# Patient Record
Sex: Male | Born: 1960 | Race: White | Hispanic: No | Marital: Single | Smoking: Former smoker
Health system: Southern US, Community
[De-identification: ages and names within clinical notes are randomized; demographics above are authoritative.]

## PROBLEM LIST (undated history)

## (undated) DIAGNOSIS — F32A Depression, unspecified: Secondary | ICD-10-CM

## (undated) DIAGNOSIS — E119 Type 2 diabetes mellitus without complications: Secondary | ICD-10-CM

## (undated) DIAGNOSIS — E785 Hyperlipidemia, unspecified: Secondary | ICD-10-CM

## (undated) DIAGNOSIS — K219 Gastro-esophageal reflux disease without esophagitis: Secondary | ICD-10-CM

## (undated) DIAGNOSIS — F329 Major depressive disorder, single episode, unspecified: Secondary | ICD-10-CM

## (undated) DIAGNOSIS — E8881 Metabolic syndrome: Secondary | ICD-10-CM

## (undated) DIAGNOSIS — C801 Malignant (primary) neoplasm, unspecified: Secondary | ICD-10-CM

## (undated) DIAGNOSIS — R112 Nausea with vomiting, unspecified: Secondary | ICD-10-CM

## (undated) DIAGNOSIS — I10 Essential (primary) hypertension: Secondary | ICD-10-CM

## (undated) DIAGNOSIS — E669 Obesity, unspecified: Secondary | ICD-10-CM

## (undated) DIAGNOSIS — Z9889 Other specified postprocedural states: Secondary | ICD-10-CM

## (undated) HISTORY — DX: Metabolic syndrome: E88.810

## (undated) HISTORY — DX: Obesity, unspecified: E66.9

## (undated) HISTORY — DX: Depression, unspecified: F32.A

## (undated) HISTORY — DX: Type 2 diabetes mellitus without complications: E11.9

## (undated) HISTORY — DX: Gastro-esophageal reflux disease without esophagitis: K21.9

## (undated) HISTORY — DX: Hyperlipidemia, unspecified: E78.5

## (undated) HISTORY — PX: POLYPECTOMY: SHX149

## (undated) HISTORY — DX: Metabolic syndrome: E88.81

## (undated) HISTORY — DX: Essential (primary) hypertension: I10

---
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
## 1987-03-30 HISTORY — PX: CYST REMOVAL TRUNK: SHX6283

## 2000-12-05 ENCOUNTER — Encounter: Payer: Self-pay | Admitting: Internal Medicine

## 2000-12-06 ENCOUNTER — Other Ambulatory Visit: Admission: RE | Admit: 2000-12-06 | Discharge: 2000-12-06 | Payer: Self-pay | Admitting: Gastroenterology

## 2000-12-06 ENCOUNTER — Encounter (INDEPENDENT_AMBULATORY_CARE_PROVIDER_SITE_OTHER): Payer: Self-pay | Admitting: Specialist

## 2004-02-13 ENCOUNTER — Ambulatory Visit: Payer: Self-pay | Admitting: Internal Medicine

## 2004-03-10 ENCOUNTER — Ambulatory Visit: Payer: Self-pay | Admitting: Internal Medicine

## 2004-03-31 ENCOUNTER — Ambulatory Visit: Payer: Self-pay | Admitting: Internal Medicine

## 2004-06-12 ENCOUNTER — Ambulatory Visit: Payer: Self-pay | Admitting: Internal Medicine

## 2004-09-10 ENCOUNTER — Ambulatory Visit: Payer: Self-pay | Admitting: Internal Medicine

## 2004-10-19 ENCOUNTER — Ambulatory Visit: Payer: Self-pay | Admitting: Internal Medicine

## 2004-10-27 ENCOUNTER — Ambulatory Visit: Payer: Self-pay | Admitting: Internal Medicine

## 2005-02-02 ENCOUNTER — Ambulatory Visit: Payer: Self-pay | Admitting: Internal Medicine

## 2005-05-06 ENCOUNTER — Ambulatory Visit: Payer: Self-pay | Admitting: Internal Medicine

## 2005-05-13 ENCOUNTER — Ambulatory Visit: Payer: Self-pay | Admitting: Internal Medicine

## 2005-08-05 ENCOUNTER — Ambulatory Visit: Payer: Self-pay | Admitting: Internal Medicine

## 2005-08-12 ENCOUNTER — Ambulatory Visit: Payer: Self-pay | Admitting: Internal Medicine

## 2005-12-16 ENCOUNTER — Ambulatory Visit: Payer: Self-pay | Admitting: Internal Medicine

## 2005-12-24 ENCOUNTER — Ambulatory Visit: Payer: Self-pay | Admitting: Internal Medicine

## 2006-03-29 HISTORY — PX: MELANOMA EXCISION: SHX5266

## 2006-07-05 ENCOUNTER — Ambulatory Visit: Payer: Self-pay | Admitting: Internal Medicine

## 2006-07-11 ENCOUNTER — Ambulatory Visit: Payer: Self-pay | Admitting: Internal Medicine

## 2006-07-19 LAB — CBC WITH DIFFERENTIAL/PLATELET
BASO%: 0.6 % (ref 0.0–2.0)
HCT: 39.5 % (ref 38.7–49.9)
MCHC: 35.3 g/dL (ref 32.0–35.9)
MONO#: 0.6 10*3/uL (ref 0.1–0.9)
NEUT%: 45 % (ref 40.0–75.0)
WBC: 6.2 10*3/uL (ref 4.0–10.0)
lymph#: 2.6 10*3/uL (ref 0.9–3.3)

## 2006-07-19 LAB — COMPREHENSIVE METABOLIC PANEL
ALT: 29 U/L (ref 0–53)
Albumin: 4.3 g/dL (ref 3.5–5.2)
CO2: 27 mEq/L (ref 19–32)
Calcium: 9.3 mg/dL (ref 8.4–10.5)
Chloride: 103 mEq/L (ref 96–112)
Creatinine, Ser: 1.03 mg/dL (ref 0.40–1.50)
Potassium: 4.9 mEq/L (ref 3.5–5.3)
Sodium: 139 mEq/L (ref 135–145)
Total Protein: 7.1 g/dL (ref 6.0–8.3)

## 2006-07-19 LAB — LACTATE DEHYDROGENASE: LDH: 139 U/L (ref 94–250)

## 2006-07-22 ENCOUNTER — Ambulatory Visit (HOSPITAL_COMMUNITY): Admission: RE | Admit: 2006-07-22 | Discharge: 2006-07-22 | Payer: Self-pay | Admitting: Internal Medicine

## 2006-07-25 ENCOUNTER — Ambulatory Visit (HOSPITAL_COMMUNITY): Admission: RE | Admit: 2006-07-25 | Discharge: 2006-07-25 | Payer: Self-pay | Admitting: Otolaryngology

## 2006-07-26 ENCOUNTER — Encounter (INDEPENDENT_AMBULATORY_CARE_PROVIDER_SITE_OTHER): Payer: Self-pay | Admitting: *Deleted

## 2006-07-26 ENCOUNTER — Ambulatory Visit (HOSPITAL_BASED_OUTPATIENT_CLINIC_OR_DEPARTMENT_OTHER): Admission: RE | Admit: 2006-07-26 | Discharge: 2006-07-26 | Payer: Self-pay | Admitting: Otolaryngology

## 2006-08-02 ENCOUNTER — Encounter: Payer: Self-pay | Admitting: Internal Medicine

## 2006-08-09 LAB — COMPREHENSIVE METABOLIC PANEL
AST: 21 U/L (ref 0–37)
Albumin: 4.4 g/dL (ref 3.5–5.2)
BUN: 12 mg/dL (ref 6–23)
Calcium: 9.2 mg/dL (ref 8.4–10.5)
Chloride: 100 mEq/L (ref 96–112)
Potassium: 4.3 mEq/L (ref 3.5–5.3)

## 2006-08-09 LAB — CBC WITH DIFFERENTIAL/PLATELET
BASO%: 0.4 % (ref 0.0–2.0)
EOS%: 2.5 % (ref 0.0–7.0)
MCH: 28.5 pg (ref 28.0–33.4)
MCHC: 35.2 g/dL (ref 32.0–35.9)
MONO%: 6.8 % (ref 0.0–13.0)
RBC: 4.9 10*6/uL (ref 4.20–5.71)
RDW: 13 % (ref 11.2–14.6)
lymph#: 2.6 10*3/uL (ref 0.9–3.3)

## 2006-08-15 ENCOUNTER — Inpatient Hospital Stay (HOSPITAL_COMMUNITY): Admission: RE | Admit: 2006-08-15 | Discharge: 2006-08-17 | Payer: Self-pay | Admitting: Otolaryngology

## 2006-08-15 ENCOUNTER — Encounter (INDEPENDENT_AMBULATORY_CARE_PROVIDER_SITE_OTHER): Payer: Self-pay | Admitting: Otolaryngology

## 2006-08-30 ENCOUNTER — Ambulatory Visit: Payer: Self-pay | Admitting: Internal Medicine

## 2006-08-31 ENCOUNTER — Ambulatory Visit: Payer: Self-pay | Admitting: Internal Medicine

## 2006-08-31 LAB — CONVERTED CEMR LAB
Basophils Absolute: 0 10*3/uL (ref 0.0–0.1)
Basophils Relative: 0.1 % (ref 0.0–1.0)
Bilirubin, Direct: 0.1 mg/dL (ref 0.0–0.3)
CO2: 34 meq/L — ABNORMAL HIGH (ref 19–32)
Creatinine, Ser: 0.9 mg/dL (ref 0.4–1.5)
Direct LDL: 93 mg/dL
Glucose, Bld: 117 mg/dL — ABNORMAL HIGH (ref 70–99)
HCT: 39.5 % (ref 39.0–52.0)
Hemoglobin: 13.8 g/dL (ref 13.0–17.0)
Hgb A1c MFr Bld: 6.4 % — ABNORMAL HIGH (ref 4.6–6.0)
MCHC: 35 g/dL (ref 30.0–36.0)
Monocytes Absolute: 0.4 10*3/uL (ref 0.2–0.7)
Neutrophils Relative %: 51.6 % (ref 43.0–77.0)
Potassium: 4.6 meq/L (ref 3.5–5.1)
RDW: 12.6 % (ref 11.5–14.6)
Sodium: 140 meq/L (ref 135–145)
Total Bilirubin: 0.7 mg/dL (ref 0.3–1.2)
Total CHOL/HDL Ratio: 4.6
Total Protein: 6.8 g/dL (ref 6.0–8.3)

## 2006-09-01 LAB — CBC WITH DIFFERENTIAL/PLATELET
BASO%: 0.3 % (ref 0.0–2.0)
EOS%: 2.7 % (ref 0.0–7.0)
Eosinophils Absolute: 0.2 10*3/uL (ref 0.0–0.5)
MCH: 28.9 pg (ref 28.0–33.4)
MCHC: 35.3 g/dL (ref 32.0–35.9)
MCV: 81.8 fL (ref 81.6–98.0)
MONO%: 5.3 % (ref 0.0–13.0)
NEUT#: 3.8 10*3/uL (ref 1.5–6.5)
RBC: 4.87 10*6/uL (ref 4.20–5.71)
RDW: 13 % (ref 11.2–14.6)

## 2006-09-01 LAB — COMPREHENSIVE METABOLIC PANEL
ALT: 32 U/L (ref 0–53)
AST: 26 U/L (ref 0–37)
Albumin: 4.6 g/dL (ref 3.5–5.2)
Alkaline Phosphatase: 78 U/L (ref 39–117)
Potassium: 4.4 mEq/L (ref 3.5–5.3)
Sodium: 139 mEq/L (ref 135–145)
Total Protein: 7.4 g/dL (ref 6.0–8.3)

## 2006-09-06 ENCOUNTER — Ambulatory Visit: Payer: Self-pay | Admitting: Internal Medicine

## 2006-09-19 LAB — COMPREHENSIVE METABOLIC PANEL
Albumin: 4.2 g/dL (ref 3.5–5.2)
CO2: 29 mEq/L (ref 19–32)
Calcium: 9.4 mg/dL (ref 8.4–10.5)
Glucose, Bld: 125 mg/dL — ABNORMAL HIGH (ref 70–99)
Sodium: 139 mEq/L (ref 135–145)
Total Bilirubin: 0.4 mg/dL (ref 0.3–1.2)
Total Protein: 7 g/dL (ref 6.0–8.3)

## 2006-09-19 LAB — CBC WITH DIFFERENTIAL/PLATELET
BASO%: 0.8 % (ref 0.0–2.0)
Eosinophils Absolute: 0.2 10*3/uL (ref 0.0–0.5)
HCT: 43.3 % (ref 38.7–49.9)
LYMPH%: 37 % (ref 14.0–48.0)
MCHC: 34 g/dL (ref 32.0–35.9)
MCV: 81.1 fL — ABNORMAL LOW (ref 81.6–98.0)
MONO%: 8 % (ref 0.0–13.0)
NEUT%: 50 % (ref 40.0–75.0)
Platelets: 320 10*3/uL (ref 145–400)
RBC: 5.35 10*6/uL (ref 4.20–5.71)

## 2006-09-26 LAB — CBC WITH DIFFERENTIAL/PLATELET
Eosinophils Absolute: 0 10*3/uL (ref 0.0–0.5)
MONO#: 0.3 10*3/uL (ref 0.1–0.9)
NEUT#: 1.4 10*3/uL — ABNORMAL LOW (ref 1.5–6.5)
Platelets: 195 10*3/uL (ref 145–400)
RBC: 5.34 10*6/uL (ref 4.20–5.71)
RDW: 12.6 % (ref 11.2–14.6)
WBC: 3.6 10*3/uL — ABNORMAL LOW (ref 4.0–10.0)
lymph#: 1.8 10*3/uL (ref 0.9–3.3)

## 2006-09-26 LAB — COMPREHENSIVE METABOLIC PANEL
ALT: 77 U/L — ABNORMAL HIGH (ref 0–53)
Albumin: 4.2 g/dL (ref 3.5–5.2)
CO2: 24 mEq/L (ref 19–32)
Chloride: 103 mEq/L (ref 96–112)
Glucose, Bld: 123 mg/dL — ABNORMAL HIGH (ref 70–99)
Potassium: 4.2 mEq/L (ref 3.5–5.3)
Sodium: 137 mEq/L (ref 135–145)
Total Protein: 7 g/dL (ref 6.0–8.3)

## 2006-09-26 LAB — LACTATE DEHYDROGENASE: LDH: 171 U/L (ref 94–250)

## 2006-09-29 LAB — CBC WITH DIFFERENTIAL/PLATELET
BASO%: 0.2 % (ref 0.0–2.0)
EOS%: 0.3 % (ref 0.0–7.0)
LYMPH%: 50.5 % — ABNORMAL HIGH (ref 14.0–48.0)
MCH: 27.7 pg — ABNORMAL LOW (ref 28.0–33.4)
MCHC: 35.2 g/dL (ref 32.0–35.9)
MONO#: 0.3 10*3/uL (ref 0.1–0.9)
RBC: 5.16 10*6/uL (ref 4.20–5.71)
WBC: 2.7 10*3/uL — ABNORMAL LOW (ref 4.0–10.0)
lymph#: 1.4 10*3/uL (ref 0.9–3.3)

## 2006-10-03 ENCOUNTER — Encounter: Payer: Self-pay | Admitting: Internal Medicine

## 2006-10-03 LAB — CBC WITH DIFFERENTIAL/PLATELET
BASO%: 0.2 % (ref 0.0–2.0)
EOS%: 0.8 % (ref 0.0–7.0)
HCT: 46.3 % (ref 38.7–49.9)
LYMPH%: 41.6 % (ref 14.0–48.0)
MCH: 27.1 pg — ABNORMAL LOW (ref 28.0–33.4)
MCHC: 34 g/dL (ref 32.0–35.9)
NEUT%: 49.9 % (ref 40.0–75.0)
Platelets: 264 10*3/uL (ref 145–400)

## 2006-10-05 LAB — COMPREHENSIVE METABOLIC PANEL
ALT: 77 U/L — ABNORMAL HIGH (ref 0–53)
AST: 32 U/L (ref 0–37)
Albumin: 3.7 g/dL (ref 3.5–5.2)
Alkaline Phosphatase: 95 U/L (ref 39–117)
BUN: 10 mg/dL (ref 6–23)
CO2: 25 mEq/L (ref 19–32)
Calcium: 8.6 mg/dL (ref 8.4–10.5)
Chloride: 105 mEq/L (ref 96–112)
Creatinine, Ser: 0.94 mg/dL (ref 0.40–1.50)
Glucose, Bld: 173 mg/dL — ABNORMAL HIGH (ref 70–99)
Potassium: 4.2 mEq/L (ref 3.5–5.3)
Sodium: 140 mEq/L (ref 135–145)
Total Bilirubin: 0.4 mg/dL (ref 0.3–1.2)
Total Protein: 6.4 g/dL (ref 6.0–8.3)

## 2006-10-05 LAB — CBC WITH DIFFERENTIAL/PLATELET
BASO%: 0.1 % (ref 0.0–2.0)
EOS%: 0.4 % (ref 0.0–7.0)
MCH: 27.4 pg — ABNORMAL LOW (ref 28.0–33.4)
MCHC: 34.4 g/dL (ref 32.0–35.9)
RDW: 10.7 % — ABNORMAL LOW (ref 11.2–14.6)
lymph#: 1.6 10*3/uL (ref 0.9–3.3)

## 2006-10-10 LAB — CBC WITH DIFFERENTIAL/PLATELET
BASO%: 0.3 % (ref 0.0–2.0)
EOS%: 0.8 % (ref 0.0–7.0)
HCT: 40.5 % (ref 38.7–49.9)
LYMPH%: 57 % — ABNORMAL HIGH (ref 14.0–48.0)
MCH: 28.1 pg (ref 28.0–33.4)
MCHC: 35.4 g/dL (ref 32.0–35.9)
MCV: 79.4 fL — ABNORMAL LOW (ref 81.6–98.0)
MONO%: 10.8 % (ref 0.0–13.0)
NEUT%: 31.1 % — ABNORMAL LOW (ref 40.0–75.0)
lymph#: 1.7 10*3/uL (ref 0.9–3.3)

## 2006-10-12 ENCOUNTER — Ambulatory Visit: Payer: Self-pay | Admitting: Internal Medicine

## 2006-10-13 LAB — CBC WITH DIFFERENTIAL/PLATELET
BASO%: 0.3 % (ref 0.0–2.0)
EOS%: 0.2 % (ref 0.0–7.0)
HGB: 13.1 g/dL (ref 13.0–17.1)
MCH: 27.9 pg — ABNORMAL LOW (ref 28.0–33.4)
MCHC: 35.1 g/dL (ref 32.0–35.9)
MONO%: 5 % (ref 0.0–13.0)
RBC: 4.68 10*6/uL (ref 4.20–5.71)
RDW: 12.8 % (ref 11.2–14.6)
lymph#: 1.6 10*3/uL (ref 0.9–3.3)

## 2006-10-17 ENCOUNTER — Encounter: Payer: Self-pay | Admitting: Internal Medicine

## 2006-10-17 LAB — COMPREHENSIVE METABOLIC PANEL
ALT: 84 U/L — ABNORMAL HIGH (ref 0–53)
AST: 56 U/L — ABNORMAL HIGH (ref 0–37)
Alkaline Phosphatase: 147 U/L — ABNORMAL HIGH (ref 39–117)
Creatinine, Ser: 0.91 mg/dL (ref 0.40–1.50)
Sodium: 137 mEq/L (ref 135–145)
Total Bilirubin: 0.5 mg/dL (ref 0.3–1.2)
Total Protein: 7.2 g/dL (ref 6.0–8.3)

## 2006-10-17 LAB — CBC WITH DIFFERENTIAL/PLATELET
Basophils Absolute: 0 10*3/uL (ref 0.0–0.1)
Eosinophils Absolute: 0 10*3/uL (ref 0.0–0.5)
HGB: 14.5 g/dL (ref 13.0–17.1)
NEUT#: 1.1 10*3/uL — ABNORMAL LOW (ref 1.5–6.5)
RBC: 5.24 10*6/uL (ref 4.20–5.71)
RDW: 11.1 % — ABNORMAL LOW (ref 11.2–14.6)
WBC: 3.5 10*3/uL — ABNORMAL LOW (ref 4.0–10.0)
lymph#: 1.9 10*3/uL (ref 0.9–3.3)

## 2006-11-01 LAB — COMPREHENSIVE METABOLIC PANEL
ALT: 32 U/L (ref 0–53)
BUN: 14 mg/dL (ref 6–23)
CO2: 22 mEq/L (ref 19–32)
Calcium: 9.5 mg/dL (ref 8.4–10.5)
Chloride: 104 mEq/L (ref 96–112)
Creatinine, Ser: 0.9 mg/dL (ref 0.40–1.50)
Total Bilirubin: 1.1 mg/dL (ref 0.3–1.2)

## 2006-11-01 LAB — CBC WITH DIFFERENTIAL/PLATELET
BASO%: 0.4 % (ref 0.0–2.0)
Basophils Absolute: 0 10*3/uL (ref 0.0–0.1)
EOS%: 2.4 % (ref 0.0–7.0)
HCT: 36.9 % — ABNORMAL LOW (ref 38.7–49.9)
HGB: 13 g/dL (ref 13.0–17.1)
LYMPH%: 27.5 % (ref 14.0–48.0)
MCH: 28 pg (ref 28.0–33.4)
MCHC: 35.2 g/dL (ref 32.0–35.9)
NEUT%: 58.4 % (ref 40.0–75.0)
Platelets: 326 10*3/uL (ref 145–400)
lymph#: 1.8 10*3/uL (ref 0.9–3.3)

## 2006-11-01 LAB — LACTATE DEHYDROGENASE: LDH: 138 U/L (ref 94–250)

## 2006-11-07 ENCOUNTER — Ambulatory Visit: Payer: Self-pay | Admitting: Internal Medicine

## 2006-11-07 DIAGNOSIS — T7841XA Arthus phenomenon, initial encounter: Secondary | ICD-10-CM | POA: Insufficient documentation

## 2006-11-07 DIAGNOSIS — E1169 Type 2 diabetes mellitus with other specified complication: Secondary | ICD-10-CM | POA: Insufficient documentation

## 2006-11-07 DIAGNOSIS — E785 Hyperlipidemia, unspecified: Secondary | ICD-10-CM

## 2006-11-07 DIAGNOSIS — K219 Gastro-esophageal reflux disease without esophagitis: Secondary | ICD-10-CM

## 2006-11-07 LAB — CBC WITH DIFFERENTIAL/PLATELET
Basophils Absolute: 0 10*3/uL (ref 0.0–0.1)
Eosinophils Absolute: 0.2 10*3/uL (ref 0.0–0.5)
HCT: 36.1 % — ABNORMAL LOW (ref 38.7–49.9)
HGB: 12.9 g/dL — ABNORMAL LOW (ref 13.0–17.1)
MCH: 28.6 pg (ref 28.0–33.4)
MONO#: 0.6 10*3/uL (ref 0.1–0.9)
NEUT#: 4.3 10*3/uL (ref 1.5–6.5)
NEUT%: 61.6 % (ref 40.0–75.0)
WBC: 6.9 10*3/uL (ref 4.0–10.0)
lymph#: 1.8 10*3/uL (ref 0.9–3.3)

## 2006-11-07 LAB — COMPREHENSIVE METABOLIC PANEL
Albumin: 4.2 g/dL (ref 3.5–5.2)
BUN: 16 mg/dL (ref 6–23)
CO2: 26 mEq/L (ref 19–32)
Calcium: 9.6 mg/dL (ref 8.4–10.5)
Chloride: 104 mEq/L (ref 96–112)
Creatinine, Ser: 0.97 mg/dL (ref 0.40–1.50)
Potassium: 4.2 mEq/L (ref 3.5–5.3)

## 2006-11-07 LAB — LACTATE DEHYDROGENASE: LDH: 148 U/L (ref 94–250)

## 2006-11-07 LAB — CONVERTED CEMR LAB
BUN: 11 mg/dL (ref 6–23)
CO2: 30 meq/L (ref 19–32)
Calcium: 8.8 mg/dL (ref 8.4–10.5)
Creatinine, Ser: 0.9 mg/dL (ref 0.4–1.5)
Direct LDL: 187.7 mg/dL
Hgb A1c MFr Bld: 6.8 % — ABNORMAL HIGH (ref 4.6–6.0)

## 2006-11-14 LAB — CBC WITH DIFFERENTIAL/PLATELET
Basophils Absolute: 0 10*3/uL (ref 0.0–0.1)
EOS%: 2.3 % (ref 0.0–7.0)
Eosinophils Absolute: 0.1 10*3/uL (ref 0.0–0.5)
HGB: 13.9 g/dL (ref 13.0–17.1)
MCH: 28.1 pg (ref 28.0–33.4)
MONO%: 9.3 % (ref 0.0–13.0)
NEUT#: 1.3 10*3/uL — ABNORMAL LOW (ref 1.5–6.5)
RBC: 4.95 10*6/uL (ref 4.20–5.71)
RDW: 13.6 % (ref 11.2–14.6)
lymph#: 2.1 10*3/uL (ref 0.9–3.3)

## 2006-11-14 LAB — COMPREHENSIVE METABOLIC PANEL
AST: 25 U/L (ref 0–37)
Albumin: 4.4 g/dL (ref 3.5–5.2)
Alkaline Phosphatase: 93 U/L (ref 39–117)
BUN: 10 mg/dL (ref 6–23)
Calcium: 9.3 mg/dL (ref 8.4–10.5)
Chloride: 100 mEq/L (ref 96–112)
Potassium: 4.3 mEq/L (ref 3.5–5.3)
Sodium: 139 mEq/L (ref 135–145)
Total Protein: 7.5 g/dL (ref 6.0–8.3)

## 2006-11-21 LAB — CBC WITH DIFFERENTIAL/PLATELET
BASO%: 0.3 % (ref 0.0–2.0)
Eosinophils Absolute: 0.1 10*3/uL (ref 0.0–0.5)
HCT: 39.5 % (ref 38.7–49.9)
LYMPH%: 49.8 % — ABNORMAL HIGH (ref 14.0–48.0)
MCHC: 35.4 g/dL (ref 32.0–35.9)
MCV: 79.2 fL — ABNORMAL LOW (ref 81.6–98.0)
MONO#: 0.4 10*3/uL (ref 0.1–0.9)
MONO%: 9.3 % (ref 0.0–13.0)
NEUT%: 37.8 % — ABNORMAL LOW (ref 40.0–75.0)
Platelets: 223 10*3/uL (ref 145–400)
WBC: 4 10*3/uL (ref 4.0–10.0)

## 2006-11-23 ENCOUNTER — Ambulatory Visit: Payer: Self-pay | Admitting: Internal Medicine

## 2006-11-30 LAB — COMPREHENSIVE METABOLIC PANEL
ALT: 42 U/L (ref 0–53)
AST: 27 U/L (ref 0–37)
Alkaline Phosphatase: 82 U/L (ref 39–117)
Creatinine, Ser: 0.87 mg/dL (ref 0.40–1.50)
Total Bilirubin: 0.4 mg/dL (ref 0.3–1.2)

## 2006-11-30 LAB — CBC WITH DIFFERENTIAL/PLATELET
BASO%: 1 % (ref 0.0–2.0)
EOS%: 2.9 % (ref 0.0–7.0)
HCT: 38.8 % (ref 38.7–49.9)
LYMPH%: 52 % — ABNORMAL HIGH (ref 14.0–48.0)
MCH: 28.3 pg (ref 28.0–33.4)
MCHC: 36.1 g/dL — ABNORMAL HIGH (ref 32.0–35.9)
NEUT%: 37 % — ABNORMAL LOW (ref 40.0–75.0)
Platelets: 196 10*3/uL (ref 145–400)

## 2006-11-30 LAB — LACTATE DEHYDROGENASE: LDH: 160 U/L (ref 94–250)

## 2006-12-01 ENCOUNTER — Encounter: Payer: Self-pay | Admitting: Internal Medicine

## 2006-12-05 ENCOUNTER — Encounter: Payer: Self-pay | Admitting: Internal Medicine

## 2006-12-05 LAB — COMPREHENSIVE METABOLIC PANEL
Albumin: 4.3 g/dL (ref 3.5–5.2)
Alkaline Phosphatase: 93 U/L (ref 39–117)
BUN: 14 mg/dL (ref 6–23)
Calcium: 9.2 mg/dL (ref 8.4–10.5)
Chloride: 104 mEq/L (ref 96–112)
Glucose, Bld: 133 mg/dL — ABNORMAL HIGH (ref 70–99)
Potassium: 4.3 mEq/L (ref 3.5–5.3)
Sodium: 140 mEq/L (ref 135–145)
Total Protein: 7 g/dL (ref 6.0–8.3)

## 2006-12-05 LAB — CBC WITH DIFFERENTIAL/PLATELET
Basophils Absolute: 0 10*3/uL (ref 0.0–0.1)
EOS%: 3.6 % (ref 0.0–7.0)
HCT: 37.6 % — ABNORMAL LOW (ref 38.7–49.9)
HGB: 13.7 g/dL (ref 13.0–17.1)
MCH: 28.5 pg (ref 28.0–33.4)
MCV: 78.4 fL — ABNORMAL LOW (ref 81.6–98.0)
NEUT%: 29.1 % — ABNORMAL LOW (ref 40.0–75.0)
Platelets: 187 10*3/uL (ref 145–400)
lymph#: 1.7 10*3/uL (ref 0.9–3.3)

## 2006-12-12 LAB — CBC WITH DIFFERENTIAL/PLATELET
EOS%: 5.1 % (ref 0.0–7.0)
MCHC: 35.5 g/dL (ref 32.0–35.9)
MCV: 78.7 fL — ABNORMAL LOW (ref 81.6–98.0)
MONO#: 0.4 10*3/uL (ref 0.1–0.9)
NEUT#: 1.2 10*3/uL — ABNORMAL LOW (ref 1.5–6.5)
Platelets: 172 10*3/uL (ref 145–400)
RBC: 4.95 10*6/uL (ref 4.20–5.71)
RDW: 14.1 % (ref 11.2–14.6)
WBC: 3.7 10*3/uL — ABNORMAL LOW (ref 4.0–10.0)

## 2006-12-12 LAB — LACTATE DEHYDROGENASE: LDH: 173 U/L (ref 94–250)

## 2006-12-12 LAB — COMPREHENSIVE METABOLIC PANEL
AST: 26 U/L (ref 0–37)
BUN: 12 mg/dL (ref 6–23)
CO2: 27 mEq/L (ref 19–32)
Calcium: 8.7 mg/dL (ref 8.4–10.5)
Chloride: 104 mEq/L (ref 96–112)
Creatinine, Ser: 1.1 mg/dL (ref 0.40–1.50)
Glucose, Bld: 125 mg/dL — ABNORMAL HIGH (ref 70–99)

## 2006-12-16 ENCOUNTER — Ambulatory Visit: Payer: Self-pay | Admitting: Internal Medicine

## 2006-12-16 DIAGNOSIS — Z85820 Personal history of malignant melanoma of skin: Secondary | ICD-10-CM

## 2006-12-16 DIAGNOSIS — F3342 Major depressive disorder, recurrent, in full remission: Secondary | ICD-10-CM

## 2006-12-16 LAB — CONVERTED CEMR LAB
Cholesterol, target level: 200 mg/dL
HDL goal, serum: 40 mg/dL

## 2006-12-19 LAB — COMPREHENSIVE METABOLIC PANEL
BUN: 15 mg/dL (ref 6–23)
CO2: 26 mEq/L (ref 19–32)
Calcium: 8.8 mg/dL (ref 8.4–10.5)
Chloride: 104 mEq/L (ref 96–112)
Creatinine, Ser: 1.01 mg/dL (ref 0.40–1.50)
Glucose, Bld: 143 mg/dL — ABNORMAL HIGH (ref 70–99)

## 2006-12-19 LAB — LACTATE DEHYDROGENASE: LDH: 163 U/L (ref 94–250)

## 2006-12-19 LAB — CBC WITH DIFFERENTIAL/PLATELET
Basophils Absolute: 0 10*3/uL (ref 0.0–0.1)
Eosinophils Absolute: 0.1 10*3/uL (ref 0.0–0.5)
HGB: 13.2 g/dL (ref 13.0–17.1)
NEUT#: 3.2 10*3/uL (ref 1.5–6.5)
RDW: 13.9 % (ref 11.2–14.6)
WBC: 5.1 10*3/uL (ref 4.0–10.0)
lymph#: 1.5 10*3/uL (ref 0.9–3.3)

## 2006-12-26 LAB — COMPREHENSIVE METABOLIC PANEL
AST: 21 U/L (ref 0–37)
Albumin: 4.3 g/dL (ref 3.5–5.2)
BUN: 13 mg/dL (ref 6–23)
Calcium: 9 mg/dL (ref 8.4–10.5)
Chloride: 104 mEq/L (ref 96–112)
Potassium: 4.4 mEq/L (ref 3.5–5.3)

## 2006-12-26 LAB — CBC WITH DIFFERENTIAL/PLATELET
Basophils Absolute: 0 10*3/uL (ref 0.0–0.1)
EOS%: 4.9 % (ref 0.0–7.0)
Eosinophils Absolute: 0.2 10*3/uL (ref 0.0–0.5)
HGB: 13.1 g/dL (ref 13.0–17.1)
MCH: 28 pg (ref 28.0–33.4)
MONO#: 0.3 10*3/uL (ref 0.1–0.9)
NEUT#: 1.6 10*3/uL (ref 1.5–6.5)
RDW: 14.1 % (ref 11.2–14.6)
WBC: 3.6 10*3/uL — ABNORMAL LOW (ref 4.0–10.0)
lymph#: 1.5 10*3/uL (ref 0.9–3.3)

## 2007-01-04 ENCOUNTER — Encounter: Payer: Self-pay | Admitting: Internal Medicine

## 2007-01-04 LAB — CBC WITH DIFFERENTIAL/PLATELET
BASO%: 0.3 % (ref 0.0–2.0)
Eosinophils Absolute: 0.4 10*3/uL (ref 0.0–0.5)
LYMPH%: 35.3 % (ref 14.0–48.0)
MCHC: 35.2 g/dL (ref 32.0–35.9)
MONO#: 0.5 10*3/uL (ref 0.1–0.9)
MONO%: 8.9 % (ref 0.0–13.0)
NEUT#: 2.5 10*3/uL (ref 1.5–6.5)
Platelets: 230 10*3/uL (ref 145–400)
RBC: 4.59 10*6/uL (ref 4.20–5.71)
RDW: 13.8 % (ref 11.2–14.6)
WBC: 5.2 10*3/uL (ref 4.0–10.0)

## 2007-01-04 LAB — COMPREHENSIVE METABOLIC PANEL
ALT: 30 U/L (ref 0–53)
Albumin: 4.2 g/dL (ref 3.5–5.2)
Alkaline Phosphatase: 83 U/L (ref 39–117)
CO2: 25 mEq/L (ref 19–32)
Potassium: 4.3 mEq/L (ref 3.5–5.3)
Sodium: 140 mEq/L (ref 135–145)
Total Bilirubin: 0.5 mg/dL (ref 0.3–1.2)
Total Protein: 6.9 g/dL (ref 6.0–8.3)

## 2007-01-05 ENCOUNTER — Ambulatory Visit: Payer: Self-pay | Admitting: Internal Medicine

## 2007-01-09 LAB — CBC WITH DIFFERENTIAL/PLATELET
BASO%: 0.4 % (ref 0.0–2.0)
EOS%: 5.2 % (ref 0.0–7.0)
HCT: 35.7 % — ABNORMAL LOW (ref 38.7–49.9)
LYMPH%: 42.2 % (ref 14.0–48.0)
MCH: 28.1 pg (ref 28.0–33.4)
MCHC: 36 g/dL — ABNORMAL HIGH (ref 32.0–35.9)
MONO%: 6.6 % (ref 0.0–13.0)
NEUT%: 45.6 % (ref 40.0–75.0)
Platelets: 209 10*3/uL (ref 145–400)
RBC: 4.58 10*6/uL (ref 4.20–5.71)

## 2007-01-09 LAB — COMPREHENSIVE METABOLIC PANEL
ALT: 36 U/L (ref 0–53)
AST: 26 U/L (ref 0–37)
Alkaline Phosphatase: 75 U/L (ref 39–117)
CO2: 27 mEq/L (ref 19–32)
Creatinine, Ser: 0.95 mg/dL (ref 0.40–1.50)
Total Bilirubin: 0.5 mg/dL (ref 0.3–1.2)

## 2007-01-09 LAB — LACTATE DEHYDROGENASE: LDH: 156 U/L (ref 94–250)

## 2007-01-16 LAB — CBC WITH DIFFERENTIAL/PLATELET
BASO%: 0.3 % (ref 0.0–2.0)
EOS%: 4.4 % (ref 0.0–7.0)
HCT: 36.7 % — ABNORMAL LOW (ref 38.7–49.9)
LYMPH%: 40.8 % (ref 14.0–48.0)
MCH: 27.8 pg — ABNORMAL LOW (ref 28.0–33.4)
MCHC: 35 g/dL (ref 32.0–35.9)
MONO#: 0.3 10*3/uL (ref 0.1–0.9)
NEUT%: 47 % (ref 40.0–75.0)
Platelets: 216 10*3/uL (ref 145–400)
RBC: 4.61 10*6/uL (ref 4.20–5.71)
WBC: 4.3 10*3/uL (ref 4.0–10.0)
lymph#: 1.7 10*3/uL (ref 0.9–3.3)

## 2007-01-16 LAB — COMPREHENSIVE METABOLIC PANEL
ALT: 28 U/L (ref 0–53)
AST: 23 U/L (ref 0–37)
Creatinine, Ser: 0.88 mg/dL (ref 0.40–1.50)
Sodium: 141 mEq/L (ref 135–145)
Total Bilirubin: 0.5 mg/dL (ref 0.3–1.2)
Total Protein: 6.8 g/dL (ref 6.0–8.3)

## 2007-01-23 LAB — COMPREHENSIVE METABOLIC PANEL
AST: 24 U/L (ref 0–37)
Alkaline Phosphatase: 80 U/L (ref 39–117)
BUN: 14 mg/dL (ref 6–23)
Calcium: 9 mg/dL (ref 8.4–10.5)
Chloride: 101 mEq/L (ref 96–112)
Creatinine, Ser: 0.99 mg/dL (ref 0.40–1.50)
Total Bilirubin: 0.4 mg/dL (ref 0.3–1.2)

## 2007-01-23 LAB — CBC WITH DIFFERENTIAL/PLATELET
Basophils Absolute: 0 10*3/uL (ref 0.0–0.1)
EOS%: 3.5 % (ref 0.0–7.0)
HCT: 36.3 % — ABNORMAL LOW (ref 38.7–49.9)
HGB: 13.2 g/dL (ref 13.0–17.1)
LYMPH%: 44.3 % (ref 14.0–48.0)
MCH: 28.3 pg (ref 28.0–33.4)
MCHC: 36.3 g/dL — ABNORMAL HIGH (ref 32.0–35.9)
MCV: 78.1 fL — ABNORMAL LOW (ref 81.6–98.0)
MONO%: 5.9 % (ref 0.0–13.0)
NEUT%: 46.1 % (ref 40.0–75.0)
Platelets: 212 10*3/uL (ref 145–400)

## 2007-01-30 ENCOUNTER — Encounter: Payer: Self-pay | Admitting: Internal Medicine

## 2007-01-30 LAB — COMPREHENSIVE METABOLIC PANEL
CO2: 26 mEq/L (ref 19–32)
Calcium: 9.1 mg/dL (ref 8.4–10.5)
Chloride: 100 mEq/L (ref 96–112)
Creatinine, Ser: 0.89 mg/dL (ref 0.40–1.50)
Glucose, Bld: 234 mg/dL — ABNORMAL HIGH (ref 70–99)
Total Bilirubin: 0.4 mg/dL (ref 0.3–1.2)
Total Protein: 7.2 g/dL (ref 6.0–8.3)

## 2007-01-30 LAB — CBC WITH DIFFERENTIAL/PLATELET
Eosinophils Absolute: 0.1 10*3/uL (ref 0.0–0.5)
HCT: 36.9 % — ABNORMAL LOW (ref 38.7–49.9)
HGB: 13.3 g/dL (ref 13.0–17.1)
LYMPH%: 46.7 % (ref 14.0–48.0)
MONO#: 0.3 10*3/uL (ref 0.1–0.9)
NEUT#: 1.7 10*3/uL (ref 1.5–6.5)
NEUT%: 41.8 % (ref 40.0–75.0)
Platelets: 208 10*3/uL (ref 145–400)
WBC: 4.1 10*3/uL (ref 4.0–10.0)

## 2007-02-03 ENCOUNTER — Telehealth: Payer: Self-pay | Admitting: Internal Medicine

## 2007-02-06 LAB — CBC WITH DIFFERENTIAL/PLATELET
BASO%: 0.3 % (ref 0.0–2.0)
Eosinophils Absolute: 0.1 10*3/uL (ref 0.0–0.5)
LYMPH%: 43.2 % (ref 14.0–48.0)
MONO#: 0.4 10*3/uL (ref 0.1–0.9)
NEUT#: 2.1 10*3/uL (ref 1.5–6.5)
Platelets: 186 10*3/uL (ref 145–400)
RBC: 4.76 10*6/uL (ref 4.20–5.71)
WBC: 4.4 10*3/uL (ref 4.0–10.0)
lymph#: 1.9 10*3/uL (ref 0.9–3.3)

## 2007-02-06 LAB — COMPREHENSIVE METABOLIC PANEL
ALT: 31 U/L (ref 0–53)
Albumin: 4.3 g/dL (ref 3.5–5.2)
CO2: 28 mEq/L (ref 19–32)
Calcium: 8.8 mg/dL (ref 8.4–10.5)
Chloride: 100 mEq/L (ref 96–112)
Glucose, Bld: 178 mg/dL — ABNORMAL HIGH (ref 70–99)
Potassium: 4.2 mEq/L (ref 3.5–5.3)
Sodium: 139 mEq/L (ref 135–145)
Total Bilirubin: 0.5 mg/dL (ref 0.3–1.2)
Total Protein: 7.1 g/dL (ref 6.0–8.3)

## 2007-02-09 ENCOUNTER — Ambulatory Visit: Payer: Self-pay | Admitting: Internal Medicine

## 2007-02-13 LAB — CBC WITH DIFFERENTIAL/PLATELET
BASO%: 0.3 % (ref 0.0–2.0)
Eosinophils Absolute: 0.1 10*3/uL (ref 0.0–0.5)
HGB: 13.7 g/dL (ref 13.0–17.1)
LYMPH%: 47.8 % (ref 14.0–48.0)
MCHC: 35.8 g/dL (ref 32.0–35.9)
MONO#: 0.3 10*3/uL (ref 0.1–0.9)
NEUT#: 1.7 10*3/uL (ref 1.5–6.5)
NEUT%: 42.6 % (ref 40.0–75.0)
Platelets: 182 10*3/uL (ref 145–400)
RBC: 4.88 10*6/uL (ref 4.20–5.71)
RDW: 14.7 % — ABNORMAL HIGH (ref 11.2–14.6)
WBC: 4 10*3/uL (ref 4.0–10.0)
lymph#: 1.9 10*3/uL (ref 0.9–3.3)

## 2007-02-13 LAB — COMPREHENSIVE METABOLIC PANEL
ALT: 33 U/L (ref 0–53)
AST: 26 U/L (ref 0–37)
CO2: 27 mEq/L (ref 19–32)
Chloride: 100 mEq/L (ref 96–112)
Creatinine, Ser: 1.23 mg/dL (ref 0.40–1.50)
Sodium: 140 mEq/L (ref 135–145)
Total Bilirubin: 0.4 mg/dL (ref 0.3–1.2)
Total Protein: 7.3 g/dL (ref 6.0–8.3)

## 2007-02-15 ENCOUNTER — Ambulatory Visit: Payer: Self-pay | Admitting: Internal Medicine

## 2007-02-16 ENCOUNTER — Encounter: Payer: Self-pay | Admitting: Internal Medicine

## 2007-02-20 LAB — COMPREHENSIVE METABOLIC PANEL
ALT: 37 U/L (ref 0–53)
AST: 28 U/L (ref 0–37)
Alkaline Phosphatase: 74 U/L (ref 39–117)
Creatinine, Ser: 1.06 mg/dL (ref 0.40–1.50)
Sodium: 141 mEq/L (ref 135–145)
Total Bilirubin: 0.5 mg/dL (ref 0.3–1.2)
Total Protein: 6.8 g/dL (ref 6.0–8.3)

## 2007-02-20 LAB — CBC WITH DIFFERENTIAL/PLATELET
BASO%: 0.3 % (ref 0.0–2.0)
EOS%: 1.9 % (ref 0.0–7.0)
HCT: 35.2 % — ABNORMAL LOW (ref 38.7–49.9)
LYMPH%: 50.3 % — ABNORMAL HIGH (ref 14.0–48.0)
MCH: 28.1 pg (ref 28.0–33.4)
MCHC: 35.9 g/dL (ref 32.0–35.9)
MONO#: 0.3 10*3/uL (ref 0.1–0.9)
MONO%: 7.1 % (ref 0.0–13.0)
NEUT%: 40.4 % (ref 40.0–75.0)
Platelets: 180 10*3/uL (ref 145–400)
RBC: 4.5 10*6/uL (ref 4.20–5.71)
WBC: 3.9 10*3/uL — ABNORMAL LOW (ref 4.0–10.0)

## 2007-02-27 LAB — CBC WITH DIFFERENTIAL/PLATELET
BASO%: 0.5 % (ref 0.0–2.0)
HCT: 36.3 % — ABNORMAL LOW (ref 38.7–49.9)
LYMPH%: 49.5 % — ABNORMAL HIGH (ref 14.0–48.0)
MCHC: 35.8 g/dL (ref 32.0–35.9)
MCV: 78.5 fL — ABNORMAL LOW (ref 81.6–98.0)
MONO#: 0.2 10*3/uL (ref 0.1–0.9)
MONO%: 5.6 % (ref 0.0–13.0)
NEUT%: 42.6 % (ref 40.0–75.0)
Platelets: 171 10*3/uL (ref 145–400)
RBC: 4.62 10*6/uL (ref 4.20–5.71)
WBC: 3.3 10*3/uL — ABNORMAL LOW (ref 4.0–10.0)

## 2007-02-27 LAB — COMPREHENSIVE METABOLIC PANEL
ALT: 39 U/L (ref 0–53)
Alkaline Phosphatase: 79 U/L (ref 39–117)
CO2: 24 mEq/L (ref 19–32)
Creatinine, Ser: 1 mg/dL (ref 0.40–1.50)
Glucose, Bld: 175 mg/dL — ABNORMAL HIGH (ref 70–99)
Total Bilirubin: 0.5 mg/dL (ref 0.3–1.2)

## 2007-03-06 LAB — COMPREHENSIVE METABOLIC PANEL
ALT: 39 U/L (ref 0–53)
AST: 31 U/L (ref 0–37)
Alkaline Phosphatase: 76 U/L (ref 39–117)
Glucose, Bld: 172 mg/dL — ABNORMAL HIGH (ref 70–99)
Sodium: 138 mEq/L (ref 135–145)
Total Bilirubin: 0.5 mg/dL (ref 0.3–1.2)
Total Protein: 7.2 g/dL (ref 6.0–8.3)

## 2007-03-06 LAB — CBC WITH DIFFERENTIAL/PLATELET
BASO%: 0.5 % (ref 0.0–2.0)
EOS%: 1 % (ref 0.0–7.0)
LYMPH%: 46.7 % (ref 14.0–48.0)
MCH: 28 pg (ref 28.0–33.4)
MCHC: 35.4 g/dL (ref 32.0–35.9)
MCV: 78.9 fL — ABNORMAL LOW (ref 81.6–98.0)
MONO%: 9.5 % (ref 0.0–13.0)
Platelets: 175 10*3/uL (ref 145–400)
RBC: 4.71 10*6/uL (ref 4.20–5.71)

## 2007-03-13 LAB — CBC WITH DIFFERENTIAL/PLATELET
Eosinophils Absolute: 0 10*3/uL (ref 0.0–0.5)
LYMPH%: 48 % (ref 14.0–48.0)
MCV: 79.1 fL — ABNORMAL LOW (ref 81.6–98.0)
MONO%: 8.1 % (ref 0.0–13.0)
NEUT#: 1.6 10*3/uL (ref 1.5–6.5)
Platelets: 176 10*3/uL (ref 145–400)
RBC: 4.74 10*6/uL (ref 4.20–5.71)

## 2007-03-13 LAB — COMPREHENSIVE METABOLIC PANEL
Alkaline Phosphatase: 77 U/L (ref 39–117)
BUN: 13 mg/dL (ref 6–23)
Glucose, Bld: 208 mg/dL — ABNORMAL HIGH (ref 70–99)
Sodium: 138 mEq/L (ref 135–145)
Total Bilirubin: 0.5 mg/dL (ref 0.3–1.2)

## 2007-03-20 ENCOUNTER — Ambulatory Visit: Payer: Self-pay | Admitting: Gastroenterology

## 2007-03-27 ENCOUNTER — Ambulatory Visit: Payer: Self-pay | Admitting: Internal Medicine

## 2007-03-27 ENCOUNTER — Encounter: Payer: Self-pay | Admitting: Internal Medicine

## 2007-03-27 LAB — COMPREHENSIVE METABOLIC PANEL
AST: 31 U/L (ref 0–37)
BUN: 17 mg/dL (ref 6–23)
CO2: 25 mEq/L (ref 19–32)
Calcium: 9.5 mg/dL (ref 8.4–10.5)
Chloride: 96 mEq/L (ref 96–112)
Creatinine, Ser: 1.14 mg/dL (ref 0.40–1.50)

## 2007-03-27 LAB — CBC WITH DIFFERENTIAL/PLATELET
Basophils Absolute: 0 10*3/uL (ref 0.0–0.1)
Eosinophils Absolute: 0 10*3/uL (ref 0.0–0.5)
HCT: 40.4 % (ref 38.7–49.9)
HGB: 14.4 g/dL (ref 13.0–17.1)
LYMPH%: 36.8 % (ref 14.0–48.0)
MCV: 79.5 fL — ABNORMAL LOW (ref 81.6–98.0)
MONO%: 8 % (ref 0.0–13.0)
NEUT#: 2.7 10*3/uL (ref 1.5–6.5)
Platelets: 204 10*3/uL (ref 145–400)

## 2007-03-27 LAB — LACTATE DEHYDROGENASE: LDH: 172 U/L (ref 94–250)

## 2007-04-04 ENCOUNTER — Encounter: Payer: Self-pay | Admitting: Gastroenterology

## 2007-04-04 ENCOUNTER — Encounter: Payer: Self-pay | Admitting: Internal Medicine

## 2007-04-04 ENCOUNTER — Ambulatory Visit: Payer: Self-pay | Admitting: Gastroenterology

## 2007-04-04 LAB — CONVERTED CEMR LAB: IgA: 294 mg/dL (ref 68–378)

## 2007-04-05 LAB — CBC WITH DIFFERENTIAL/PLATELET
Basophils Absolute: 0 10*3/uL (ref 0.0–0.1)
Eosinophils Absolute: 0 10*3/uL (ref 0.0–0.5)
HGB: 14 g/dL (ref 13.0–17.1)
NEUT#: 2 10*3/uL (ref 1.5–6.5)
RDW: 14.7 % — ABNORMAL HIGH (ref 11.2–14.6)
lymph#: 1.5 10*3/uL (ref 0.9–3.3)

## 2007-04-05 LAB — COMPREHENSIVE METABOLIC PANEL
Albumin: 4.5 g/dL (ref 3.5–5.2)
BUN: 14 mg/dL (ref 6–23)
Calcium: 8.7 mg/dL (ref 8.4–10.5)
Chloride: 97 mEq/L (ref 96–112)
Glucose, Bld: 299 mg/dL — ABNORMAL HIGH (ref 70–99)
Potassium: 4.1 mEq/L (ref 3.5–5.3)

## 2007-04-07 ENCOUNTER — Ambulatory Visit: Payer: Self-pay | Admitting: Internal Medicine

## 2007-04-12 LAB — CBC WITH DIFFERENTIAL/PLATELET
Basophils Absolute: 0 10*3/uL (ref 0.0–0.1)
EOS%: 1.1 % (ref 0.0–7.0)
Eosinophils Absolute: 0 10*3/uL (ref 0.0–0.5)
HGB: 13.8 g/dL (ref 13.0–17.1)
MCH: 28.1 pg (ref 28.0–33.4)
NEUT#: 1.5 10*3/uL (ref 1.5–6.5)
RDW: 14.7 % — ABNORMAL HIGH (ref 11.2–14.6)
lymph#: 1.5 10*3/uL (ref 0.9–3.3)

## 2007-04-12 LAB — COMPREHENSIVE METABOLIC PANEL
ALT: 60 U/L — ABNORMAL HIGH (ref 0–53)
Alkaline Phosphatase: 78 U/L (ref 39–117)
Chloride: 98 mEq/L (ref 96–112)
Creatinine, Ser: 1.22 mg/dL (ref 0.40–1.50)
Glucose, Bld: 346 mg/dL — ABNORMAL HIGH (ref 70–99)
Potassium: 4.2 mEq/L (ref 3.5–5.3)
Total Bilirubin: 0.5 mg/dL (ref 0.3–1.2)
Total Protein: 7.3 g/dL (ref 6.0–8.3)

## 2007-04-19 LAB — COMPREHENSIVE METABOLIC PANEL
Alkaline Phosphatase: 74 U/L (ref 39–117)
BUN: 11 mg/dL (ref 6–23)
CO2: 27 mEq/L (ref 19–32)
Creatinine, Ser: 0.93 mg/dL (ref 0.40–1.50)
Glucose, Bld: 380 mg/dL — ABNORMAL HIGH (ref 70–99)
Sodium: 132 mEq/L — ABNORMAL LOW (ref 135–145)
Total Bilirubin: 0.4 mg/dL (ref 0.3–1.2)

## 2007-04-19 LAB — CBC WITH DIFFERENTIAL/PLATELET
Eosinophils Absolute: 0 10*3/uL (ref 0.0–0.5)
HCT: 36.8 % — ABNORMAL LOW (ref 38.7–49.9)
LYMPH%: 49.5 % — ABNORMAL HIGH (ref 14.0–48.0)
MCV: 79.2 fL — ABNORMAL LOW (ref 81.6–98.0)
MONO#: 0.2 10*3/uL (ref 0.1–0.9)
MONO%: 6.8 % (ref 0.0–13.0)
NEUT#: 1.3 10*3/uL — ABNORMAL LOW (ref 1.5–6.5)
NEUT%: 41.7 % (ref 40.0–75.0)
Platelets: 179 10*3/uL (ref 145–400)
RBC: 4.64 10*6/uL (ref 4.20–5.71)
WBC: 3.2 10*3/uL — ABNORMAL LOW (ref 4.0–10.0)

## 2007-04-24 ENCOUNTER — Encounter: Payer: Self-pay | Admitting: Internal Medicine

## 2007-04-24 LAB — CBC WITH DIFFERENTIAL/PLATELET
Basophils Absolute: 0 10*3/uL (ref 0.0–0.1)
Eosinophils Absolute: 0.1 10*3/uL (ref 0.0–0.5)
HCT: 39.4 % (ref 38.7–49.9)
HGB: 14.1 g/dL (ref 13.0–17.1)
LYMPH%: 44.2 % (ref 14.0–48.0)
MCV: 79.1 fL — ABNORMAL LOW (ref 81.6–98.0)
MONO#: 0.2 10*3/uL (ref 0.1–0.9)
MONO%: 4.2 % (ref 0.0–13.0)
NEUT#: 2 10*3/uL (ref 1.5–6.5)
Platelets: 199 10*3/uL (ref 145–400)
RBC: 4.98 10*6/uL (ref 4.20–5.71)
WBC: 4 10*3/uL (ref 4.0–10.0)

## 2007-04-24 LAB — COMPREHENSIVE METABOLIC PANEL
Albumin: 4.5 g/dL (ref 3.5–5.2)
BUN: 15 mg/dL (ref 6–23)
CO2: 28 mEq/L (ref 19–32)
Glucose, Bld: 410 mg/dL — ABNORMAL HIGH (ref 70–99)
Sodium: 132 mEq/L — ABNORMAL LOW (ref 135–145)
Total Bilirubin: 0.5 mg/dL (ref 0.3–1.2)
Total Protein: 7.4 g/dL (ref 6.0–8.3)

## 2007-04-24 LAB — LACTATE DEHYDROGENASE: LDH: 166 U/L (ref 94–250)

## 2007-05-01 LAB — CBC WITH DIFFERENTIAL/PLATELET
BASO%: 0.6 % (ref 0.0–2.0)
EOS%: 2.1 % (ref 0.0–7.0)
MCH: 26.8 pg — ABNORMAL LOW (ref 28.0–33.4)
MCHC: 33.8 g/dL (ref 32.0–35.9)
MCV: 79.1 fL — ABNORMAL LOW (ref 81.6–98.0)
MONO%: 7.5 % (ref 0.0–13.0)
RBC: 5.11 10*6/uL (ref 4.20–5.71)
RDW: 14.3 % (ref 11.2–14.6)

## 2007-05-01 LAB — COMPREHENSIVE METABOLIC PANEL
ALT: 50 U/L (ref 0–53)
AST: 32 U/L (ref 0–37)
Creatinine, Ser: 0.98 mg/dL (ref 0.40–1.50)
Total Bilirubin: 0.5 mg/dL (ref 0.3–1.2)

## 2007-05-04 ENCOUNTER — Ambulatory Visit (HOSPITAL_COMMUNITY): Admission: RE | Admit: 2007-05-04 | Discharge: 2007-05-04 | Payer: Self-pay | Admitting: Internal Medicine

## 2007-05-08 ENCOUNTER — Ambulatory Visit: Payer: Self-pay | Admitting: Internal Medicine

## 2007-05-08 ENCOUNTER — Encounter: Payer: Self-pay | Admitting: Internal Medicine

## 2007-05-08 LAB — COMPREHENSIVE METABOLIC PANEL
ALT: 76 U/L — ABNORMAL HIGH (ref 0–53)
BUN: 12 mg/dL (ref 6–23)
CO2: 24 mEq/L (ref 19–32)
Creatinine, Ser: 0.99 mg/dL (ref 0.40–1.50)
Glucose, Bld: 411 mg/dL — ABNORMAL HIGH (ref 70–99)
Total Bilirubin: 0.5 mg/dL (ref 0.3–1.2)

## 2007-05-08 LAB — CBC WITH DIFFERENTIAL/PLATELET
BASO%: 0.5 % (ref 0.0–2.0)
Basophils Absolute: 0 10*3/uL (ref 0.0–0.1)
HCT: 40.3 % (ref 38.7–49.9)
LYMPH%: 41.2 % (ref 14.0–48.0)
MCHC: 33.3 g/dL (ref 32.0–35.9)
MONO#: 0.3 10*3/uL (ref 0.1–0.9)
NEUT%: 49 % (ref 40.0–75.0)
Platelets: 211 10*3/uL (ref 145–400)
WBC: 3.5 10*3/uL — ABNORMAL LOW (ref 4.0–10.0)

## 2007-05-15 LAB — CBC WITH DIFFERENTIAL/PLATELET
Basophils Absolute: 0 10*3/uL (ref 0.0–0.1)
Eosinophils Absolute: 0 10*3/uL (ref 0.0–0.5)
HCT: 38.1 % — ABNORMAL LOW (ref 38.7–49.9)
HGB: 13.3 g/dL (ref 13.0–17.1)
LYMPH%: 43.7 % (ref 14.0–48.0)
MONO#: 0.2 10*3/uL (ref 0.1–0.9)
NEUT#: 2 10*3/uL (ref 1.5–6.5)
NEUT%: 48.7 % (ref 40.0–75.0)
Platelets: 193 10*3/uL (ref 145–400)
WBC: 4 10*3/uL (ref 4.0–10.0)
lymph#: 1.8 10*3/uL (ref 0.9–3.3)

## 2007-05-15 LAB — COMPREHENSIVE METABOLIC PANEL
CO2: 26 mEq/L (ref 19–32)
Calcium: 9.5 mg/dL (ref 8.4–10.5)
Chloride: 98 mEq/L (ref 96–112)
Creatinine, Ser: 0.98 mg/dL (ref 0.40–1.50)
Glucose, Bld: 414 mg/dL — ABNORMAL HIGH (ref 70–99)
Total Bilirubin: 0.5 mg/dL (ref 0.3–1.2)

## 2007-05-22 LAB — CBC WITH DIFFERENTIAL/PLATELET
BASO%: 0.7 % (ref 0.0–2.0)
Eosinophils Absolute: 0 10*3/uL (ref 0.0–0.5)
HCT: 41.8 % (ref 38.7–49.9)
LYMPH%: 39.9 % (ref 14.0–48.0)
MCHC: 35.8 g/dL (ref 32.0–35.9)
MONO#: 0.2 10*3/uL (ref 0.1–0.9)
NEUT#: 2 10*3/uL (ref 1.5–6.5)
Platelets: 182 10*3/uL (ref 145–400)
RBC: 5.24 10*6/uL (ref 4.20–5.71)
WBC: 3.8 10*3/uL — ABNORMAL LOW (ref 4.0–10.0)
lymph#: 1.5 10*3/uL (ref 0.9–3.3)

## 2007-05-22 LAB — COMPREHENSIVE METABOLIC PANEL
ALT: 50 U/L (ref 0–53)
BUN: 13 mg/dL (ref 6–23)
CO2: 25 mEq/L (ref 19–32)
Calcium: 9.1 mg/dL (ref 8.4–10.5)
Chloride: 95 mEq/L — ABNORMAL LOW (ref 96–112)
Creatinine, Ser: 1.02 mg/dL (ref 0.40–1.50)

## 2007-05-31 LAB — COMPREHENSIVE METABOLIC PANEL
Albumin: 4.4 g/dL (ref 3.5–5.2)
BUN: 12 mg/dL (ref 6–23)
CO2: 25 mEq/L (ref 19–32)
Calcium: 9.2 mg/dL (ref 8.4–10.5)
Chloride: 97 mEq/L (ref 96–112)
Glucose, Bld: 505 mg/dL (ref 70–99)
Potassium: 4.5 mEq/L (ref 3.5–5.3)

## 2007-05-31 LAB — CBC WITH DIFFERENTIAL/PLATELET
Eosinophils Absolute: 0.1 10*3/uL (ref 0.0–0.5)
LYMPH%: 41.8 % (ref 14.0–48.0)
MCV: 79.3 fL — ABNORMAL LOW (ref 81.6–98.0)
MONO%: 6.3 % (ref 0.0–13.0)
NEUT#: 1.6 10*3/uL (ref 1.5–6.5)
Platelets: 169 10*3/uL (ref 145–400)
RBC: 4.64 10*6/uL (ref 4.20–5.71)

## 2007-06-01 ENCOUNTER — Telehealth: Payer: Self-pay | Admitting: Internal Medicine

## 2007-06-01 ENCOUNTER — Ambulatory Visit: Payer: Self-pay | Admitting: Internal Medicine

## 2007-06-02 ENCOUNTER — Ambulatory Visit: Payer: Self-pay | Admitting: Internal Medicine

## 2007-06-02 LAB — CONVERTED CEMR LAB
ALT: 58 units/L — ABNORMAL HIGH (ref 0–53)
Bilirubin, Direct: 0.2 mg/dL (ref 0.0–0.3)
Cholesterol: 169 mg/dL (ref 0–200)
Direct LDL: 70.6 mg/dL
Microalb Creat Ratio: 93.3 mg/g — ABNORMAL HIGH (ref 0.0–30.0)
Microalb, Ur: 20.5 mg/dL — ABNORMAL HIGH (ref 0.0–1.9)
Total CHOL/HDL Ratio: 5.2
Triglycerides: 385 mg/dL (ref 0–149)
VLDL: 77 mg/dL — ABNORMAL HIGH (ref 0–40)

## 2007-06-05 ENCOUNTER — Encounter: Payer: Self-pay | Admitting: Internal Medicine

## 2007-06-05 LAB — COMPREHENSIVE METABOLIC PANEL
AST: 26 U/L (ref 0–37)
Albumin: 4.3 g/dL (ref 3.5–5.2)
BUN: 12 mg/dL (ref 6–23)
Calcium: 9.3 mg/dL (ref 8.4–10.5)
Chloride: 97 mEq/L (ref 96–112)
Potassium: 4.6 mEq/L (ref 3.5–5.3)
Total Protein: 6.9 g/dL (ref 6.0–8.3)

## 2007-06-05 LAB — CBC WITH DIFFERENTIAL/PLATELET
Basophils Absolute: 0 10*3/uL (ref 0.0–0.1)
EOS%: 1.3 % (ref 0.0–7.0)
Eosinophils Absolute: 0 10*3/uL (ref 0.0–0.5)
HGB: 13.4 g/dL (ref 13.0–17.1)
MCH: 28.3 pg (ref 28.0–33.4)
NEUT#: 1.5 10*3/uL (ref 1.5–6.5)
RDW: 13.9 % (ref 11.2–14.6)
lymph#: 1.4 10*3/uL (ref 0.9–3.3)

## 2007-06-09 ENCOUNTER — Ambulatory Visit: Payer: Self-pay | Admitting: Internal Medicine

## 2007-06-12 LAB — LACTATE DEHYDROGENASE: LDH: 145 U/L (ref 94–250)

## 2007-06-12 LAB — CBC WITH DIFFERENTIAL/PLATELET
BASO%: 0.6 % (ref 0.0–2.0)
EOS%: 1.5 % (ref 0.0–7.0)
Eosinophils Absolute: 0.1 10*3/uL (ref 0.0–0.5)
MCH: 28.5 pg (ref 28.0–33.4)
MCHC: 35.7 g/dL (ref 32.0–35.9)
MCV: 79.9 fL — ABNORMAL LOW (ref 81.6–98.0)
MONO%: 6.2 % (ref 0.0–13.0)
NEUT#: 2.8 10*3/uL (ref 1.5–6.5)
RBC: 4.83 10*6/uL (ref 4.20–5.71)
RDW: 14.3 % (ref 11.2–14.6)

## 2007-06-12 LAB — COMPREHENSIVE METABOLIC PANEL
ALT: 27 U/L (ref 0–53)
AST: 21 U/L (ref 0–37)
Albumin: 4.3 g/dL (ref 3.5–5.2)
Alkaline Phosphatase: 69 U/L (ref 39–117)
Potassium: 4.3 mEq/L (ref 3.5–5.3)
Sodium: 135 mEq/L (ref 135–145)
Total Bilirubin: 0.5 mg/dL (ref 0.3–1.2)
Total Protein: 7 g/dL (ref 6.0–8.3)

## 2007-06-15 ENCOUNTER — Ambulatory Visit: Payer: Self-pay | Admitting: Internal Medicine

## 2007-06-16 ENCOUNTER — Ambulatory Visit: Payer: Self-pay | Admitting: Internal Medicine

## 2007-06-26 LAB — CBC WITH DIFFERENTIAL/PLATELET
BASO%: 0.4 % (ref 0.0–2.0)
Basophils Absolute: 0 10*3/uL (ref 0.0–0.1)
EOS%: 1.7 % (ref 0.0–7.0)
HGB: 13.1 g/dL (ref 13.0–17.1)
MCH: 27.6 pg — ABNORMAL LOW (ref 28.0–33.4)
MCHC: 34.6 g/dL (ref 32.0–35.9)
MONO#: 0.4 10*3/uL (ref 0.1–0.9)
RDW: 13.6 % (ref 11.2–14.6)
WBC: 3.9 10*3/uL — ABNORMAL LOW (ref 4.0–10.0)
lymph#: 1.4 10*3/uL (ref 0.9–3.3)

## 2007-06-26 LAB — COMPREHENSIVE METABOLIC PANEL
ALT: 40 U/L (ref 0–53)
AST: 27 U/L (ref 0–37)
Albumin: 4.4 g/dL (ref 3.5–5.2)
CO2: 26 mEq/L (ref 19–32)
Calcium: 9 mg/dL (ref 8.4–10.5)
Chloride: 105 mEq/L (ref 96–112)
Potassium: 4.1 mEq/L (ref 3.5–5.3)

## 2007-07-04 ENCOUNTER — Encounter: Payer: Self-pay | Admitting: Internal Medicine

## 2007-07-04 LAB — COMPREHENSIVE METABOLIC PANEL
AST: 24 U/L (ref 0–37)
Albumin: 4.5 g/dL (ref 3.5–5.2)
BUN: 11 mg/dL (ref 6–23)
Calcium: 9.1 mg/dL (ref 8.4–10.5)
Chloride: 101 mEq/L (ref 96–112)
Glucose, Bld: 188 mg/dL — ABNORMAL HIGH (ref 70–99)
Potassium: 4 mEq/L (ref 3.5–5.3)

## 2007-07-04 LAB — LACTATE DEHYDROGENASE: LDH: 159 U/L (ref 94–250)

## 2007-07-04 LAB — CBC WITH DIFFERENTIAL/PLATELET
Basophils Absolute: 0 10*3/uL (ref 0.0–0.1)
Eosinophils Absolute: 0.1 10*3/uL (ref 0.0–0.5)
HGB: 13.2 g/dL (ref 13.0–17.1)
NEUT#: 2.1 10*3/uL (ref 1.5–6.5)
RDW: 13.4 % (ref 11.2–14.6)
lymph#: 1.6 10*3/uL (ref 0.9–3.3)

## 2007-07-06 ENCOUNTER — Encounter: Payer: Self-pay | Admitting: Internal Medicine

## 2007-07-10 LAB — CBC WITH DIFFERENTIAL/PLATELET
Basophils Absolute: 0 10*3/uL (ref 0.0–0.1)
Eosinophils Absolute: 0.1 10*3/uL (ref 0.0–0.5)
HGB: 13.1 g/dL (ref 13.0–17.1)
MCV: 79.3 fL — ABNORMAL LOW (ref 81.6–98.0)
MONO#: 0.3 10*3/uL (ref 0.1–0.9)
NEUT#: 1.5 10*3/uL (ref 1.5–6.5)
RDW: 13.6 % (ref 11.2–14.6)
WBC: 3.7 10*3/uL — ABNORMAL LOW (ref 4.0–10.0)
lymph#: 1.7 10*3/uL (ref 0.9–3.3)

## 2007-07-10 LAB — COMPREHENSIVE METABOLIC PANEL
Albumin: 4.4 g/dL (ref 3.5–5.2)
BUN: 11 mg/dL (ref 6–23)
CO2: 28 mEq/L (ref 19–32)
Calcium: 9.4 mg/dL (ref 8.4–10.5)
Chloride: 101 mEq/L (ref 96–112)
Glucose, Bld: 180 mg/dL — ABNORMAL HIGH (ref 70–99)
Potassium: 4.1 mEq/L (ref 3.5–5.3)
Total Protein: 7.3 g/dL (ref 6.0–8.3)

## 2007-07-17 LAB — CBC WITH DIFFERENTIAL/PLATELET
BASO%: 0.8 % (ref 0.0–2.0)
Eosinophils Absolute: 0.1 10*3/uL (ref 0.0–0.5)
MCHC: 34.8 g/dL (ref 32.0–35.9)
MONO#: 0.3 10*3/uL (ref 0.1–0.9)
MONO%: 6.4 % (ref 0.0–13.0)
NEUT#: 2 10*3/uL (ref 1.5–6.5)
RBC: 4.86 10*6/uL (ref 4.20–5.71)
RDW: 13.8 % (ref 11.2–14.6)
WBC: 4.3 10*3/uL (ref 4.0–10.0)

## 2007-07-17 LAB — COMPREHENSIVE METABOLIC PANEL
ALT: 41 U/L (ref 0–53)
Albumin: 4.3 g/dL (ref 3.5–5.2)
Alkaline Phosphatase: 68 U/L (ref 39–117)
CO2: 26 mEq/L (ref 19–32)
Glucose, Bld: 145 mg/dL — ABNORMAL HIGH (ref 70–99)
Potassium: 4.4 mEq/L (ref 3.5–5.3)
Sodium: 143 mEq/L (ref 135–145)
Total Bilirubin: 0.3 mg/dL (ref 0.3–1.2)
Total Protein: 7.3 g/dL (ref 6.0–8.3)

## 2007-07-17 LAB — LACTATE DEHYDROGENASE: LDH: 146 U/L (ref 94–250)

## 2007-07-24 ENCOUNTER — Ambulatory Visit: Payer: Self-pay | Admitting: Internal Medicine

## 2007-07-24 LAB — COMPREHENSIVE METABOLIC PANEL
ALT: 35 U/L (ref 0–53)
AST: 29 U/L (ref 0–37)
Albumin: 4.3 g/dL (ref 3.5–5.2)
Alkaline Phosphatase: 64 U/L (ref 39–117)
BUN: 12 mg/dL (ref 6–23)
Potassium: 4.3 mEq/L (ref 3.5–5.3)
Sodium: 142 mEq/L (ref 135–145)
Total Protein: 7 g/dL (ref 6.0–8.3)

## 2007-07-24 LAB — CBC WITH DIFFERENTIAL/PLATELET
BASO%: 0.2 % (ref 0.0–2.0)
EOS%: 1.3 % (ref 0.0–7.0)
MCH: 27.7 pg — ABNORMAL LOW (ref 28.0–33.4)
MCV: 79 fL — ABNORMAL LOW (ref 81.6–98.0)
MONO%: 7.7 % (ref 0.0–13.0)
RBC: 4.6 10*6/uL (ref 4.20–5.71)
RDW: 13.8 % (ref 11.2–14.6)

## 2007-07-26 ENCOUNTER — Telehealth: Payer: Self-pay | Admitting: Internal Medicine

## 2007-07-28 ENCOUNTER — Ambulatory Visit: Payer: Self-pay | Admitting: Internal Medicine

## 2007-07-28 LAB — CONVERTED CEMR LAB
Calcium: 9.1 mg/dL (ref 8.4–10.5)
Chloride: 100 meq/L (ref 96–112)
Creatinine, Ser: 1 mg/dL (ref 0.4–1.5)
GFR calc Af Amer: 103 mL/min
GFR calc non Af Amer: 85 mL/min
Hgb A1c MFr Bld: 9.8 % — ABNORMAL HIGH (ref 4.6–6.0)

## 2007-07-31 ENCOUNTER — Encounter: Payer: Self-pay | Admitting: Internal Medicine

## 2007-07-31 LAB — CBC WITH DIFFERENTIAL/PLATELET
Eosinophils Absolute: 0.1 10*3/uL (ref 0.0–0.5)
MCV: 80.2 fL — ABNORMAL LOW (ref 81.6–98.0)
MONO%: 7.3 % (ref 0.0–13.0)
NEUT#: 1.7 10*3/uL (ref 1.5–6.5)
RBC: 4.73 10*6/uL (ref 4.20–5.71)
RDW: 13.4 % (ref 11.2–14.6)
WBC: 3.9 10*3/uL — ABNORMAL LOW (ref 4.0–10.0)

## 2007-07-31 LAB — COMPREHENSIVE METABOLIC PANEL
AST: 33 U/L (ref 0–37)
Albumin: 4.2 g/dL (ref 3.5–5.2)
Alkaline Phosphatase: 63 U/L (ref 39–117)
Glucose, Bld: 131 mg/dL — ABNORMAL HIGH (ref 70–99)
Potassium: 4.3 mEq/L (ref 3.5–5.3)
Sodium: 144 mEq/L (ref 135–145)
Total Protein: 6.9 g/dL (ref 6.0–8.3)

## 2007-08-07 LAB — CBC WITH DIFFERENTIAL/PLATELET
BASO%: 0.3 % (ref 0.0–2.0)
Basophils Absolute: 0 10e3/uL (ref 0.0–0.1)
EOS%: 3 % (ref 0.0–7.0)
Eosinophils Absolute: 0.1 10e3/uL (ref 0.0–0.5)
HCT: 38.1 % — ABNORMAL LOW (ref 38.7–49.9)
HGB: 13.2 g/dL (ref 13.0–17.1)
LYMPH%: 47 % (ref 14.0–48.0)
MCH: 27.5 pg — ABNORMAL LOW (ref 28.0–33.4)
MCHC: 34.6 g/dL (ref 32.0–35.9)
MCV: 79.5 fL — ABNORMAL LOW (ref 81.6–98.0)
MONO#: 0.3 10e3/uL (ref 0.1–0.9)
MONO%: 7.8 % (ref 0.0–13.0)
NEUT#: 1.6 10e3/uL (ref 1.5–6.5)
NEUT%: 41.9 % (ref 40.0–75.0)
Platelets: 216 10e3/uL (ref 145–400)
RBC: 4.8 10e6/uL (ref 4.20–5.71)
RDW: 14.5 % (ref 11.2–14.6)
WBC: 3.8 10e3/uL — ABNORMAL LOW (ref 4.0–10.0)
lymph#: 1.8 10e3/uL (ref 0.9–3.3)

## 2007-08-07 LAB — COMPREHENSIVE METABOLIC PANEL WITH GFR
ALT: 37 U/L (ref 0–53)
AST: 27 U/L (ref 0–37)
Albumin: 4.4 g/dL (ref 3.5–5.2)
Alkaline Phosphatase: 62 U/L (ref 39–117)
BUN: 11 mg/dL (ref 6–23)
CO2: 26 meq/L (ref 19–32)
Calcium: 9.1 mg/dL (ref 8.4–10.5)
Chloride: 105 meq/L (ref 96–112)
Creatinine, Ser: 0.94 mg/dL (ref 0.40–1.50)
Glucose, Bld: 121 mg/dL — ABNORMAL HIGH (ref 70–99)
Potassium: 4.3 meq/L (ref 3.5–5.3)
Sodium: 142 meq/L (ref 135–145)
Total Bilirubin: 0.4 mg/dL (ref 0.3–1.2)
Total Protein: 7.3 g/dL (ref 6.0–8.3)

## 2007-08-08 ENCOUNTER — Telehealth (INDEPENDENT_AMBULATORY_CARE_PROVIDER_SITE_OTHER): Payer: Self-pay | Admitting: *Deleted

## 2007-08-23 LAB — CBC WITH DIFFERENTIAL/PLATELET
Basophils Absolute: 0 10*3/uL (ref 0.0–0.1)
Eosinophils Absolute: 0.1 10*3/uL (ref 0.0–0.5)
HGB: 12.7 g/dL — ABNORMAL LOW (ref 13.0–17.1)
MCV: 79 fL — ABNORMAL LOW (ref 81.6–98.0)
MONO%: 6.7 % (ref 0.0–13.0)
NEUT#: 2.2 10*3/uL (ref 1.5–6.5)
RDW: 14.4 % (ref 11.2–14.6)
lymph#: 1.6 10*3/uL (ref 0.9–3.3)

## 2007-08-23 LAB — COMPREHENSIVE METABOLIC PANEL
Albumin: 4.1 g/dL (ref 3.5–5.2)
BUN: 12 mg/dL (ref 6–23)
Calcium: 8.1 mg/dL — ABNORMAL LOW (ref 8.4–10.5)
Chloride: 107 mEq/L (ref 96–112)
Glucose, Bld: 110 mg/dL — ABNORMAL HIGH (ref 70–99)
Potassium: 3.6 mEq/L (ref 3.5–5.3)

## 2007-08-28 ENCOUNTER — Encounter: Payer: Self-pay | Admitting: Internal Medicine

## 2007-08-28 LAB — CBC WITH DIFFERENTIAL/PLATELET
Basophils Absolute: 0 10*3/uL (ref 0.0–0.1)
Eosinophils Absolute: 0.1 10*3/uL (ref 0.0–0.5)
HCT: 38.5 % — ABNORMAL LOW (ref 38.7–49.9)
LYMPH%: 41.9 % (ref 14.0–48.0)
MCV: 79.1 fL — ABNORMAL LOW (ref 81.6–98.0)
MONO#: 0.4 10*3/uL (ref 0.1–0.9)
MONO%: 9.9 % (ref 0.0–13.0)
NEUT#: 1.8 10*3/uL (ref 1.5–6.5)
NEUT%: 45.1 % (ref 40.0–75.0)
Platelets: 238 10*3/uL (ref 145–400)
RBC: 4.87 10*6/uL (ref 4.20–5.71)

## 2007-08-28 LAB — COMPREHENSIVE METABOLIC PANEL
Alkaline Phosphatase: 58 U/L (ref 39–117)
BUN: 11 mg/dL (ref 6–23)
CO2: 28 mEq/L (ref 19–32)
Creatinine, Ser: 1.01 mg/dL (ref 0.40–1.50)
Glucose, Bld: 112 mg/dL — ABNORMAL HIGH (ref 70–99)
Sodium: 140 mEq/L (ref 135–145)
Total Bilirubin: 0.4 mg/dL (ref 0.3–1.2)

## 2007-08-28 LAB — LACTATE DEHYDROGENASE: LDH: 142 U/L (ref 94–250)

## 2007-09-05 ENCOUNTER — Ambulatory Visit: Payer: Self-pay | Admitting: Internal Medicine

## 2007-09-13 ENCOUNTER — Ambulatory Visit (HOSPITAL_COMMUNITY): Admission: RE | Admit: 2007-09-13 | Discharge: 2007-09-13 | Payer: Self-pay | Admitting: Internal Medicine

## 2007-09-25 ENCOUNTER — Encounter: Payer: Self-pay | Admitting: Internal Medicine

## 2007-09-25 LAB — CBC WITH DIFFERENTIAL/PLATELET
BASO%: 0.1 % (ref 0.0–2.0)
Eosinophils Absolute: 0.2 10*3/uL (ref 0.0–0.5)
HCT: 38.4 % — ABNORMAL LOW (ref 38.7–49.9)
HGB: 13.2 g/dL (ref 13.0–17.1)
LYMPH%: 29.7 % (ref 14.0–48.0)
MCHC: 34.3 g/dL (ref 32.0–35.9)
MONO#: 0.3 10*3/uL (ref 0.1–0.9)
NEUT#: 2.9 10*3/uL (ref 1.5–6.5)
NEUT%: 59.2 % (ref 40.0–75.0)
Platelets: 275 10*3/uL (ref 145–400)
WBC: 4.8 10*3/uL (ref 4.0–10.0)
lymph#: 1.4 10*3/uL (ref 0.9–3.3)

## 2007-09-25 LAB — COMPREHENSIVE METABOLIC PANEL
Albumin: 4.4 g/dL (ref 3.5–5.2)
Alkaline Phosphatase: 51 U/L (ref 39–117)
CO2: 25 mEq/L (ref 19–32)
Calcium: 9.2 mg/dL (ref 8.4–10.5)
Chloride: 104 mEq/L (ref 96–112)
Glucose, Bld: 192 mg/dL — ABNORMAL HIGH (ref 70–99)
Potassium: 4.3 mEq/L (ref 3.5–5.3)
Sodium: 140 mEq/L (ref 135–145)
Total Protein: 7.4 g/dL (ref 6.0–8.3)

## 2007-10-13 ENCOUNTER — Ambulatory Visit: Payer: Self-pay | Admitting: Internal Medicine

## 2007-10-23 ENCOUNTER — Ambulatory Visit: Payer: Self-pay | Admitting: Professional

## 2007-10-30 ENCOUNTER — Ambulatory Visit: Payer: Self-pay | Admitting: Professional

## 2007-11-24 ENCOUNTER — Telehealth: Payer: Self-pay | Admitting: Internal Medicine

## 2007-11-25 ENCOUNTER — Emergency Department (HOSPITAL_COMMUNITY): Admission: EM | Admit: 2007-11-25 | Discharge: 2007-11-25 | Payer: Self-pay | Admitting: Emergency Medicine

## 2007-12-08 ENCOUNTER — Ambulatory Visit: Payer: Self-pay | Admitting: Internal Medicine

## 2007-12-08 LAB — CONVERTED CEMR LAB
BUN: 16 mg/dL (ref 6–23)
Calcium: 9.1 mg/dL (ref 8.4–10.5)
Chloride: 108 meq/L (ref 96–112)
Creatinine, Ser: 1 mg/dL (ref 0.4–1.5)
GFR calc Af Amer: 103 mL/min
GFR calc non Af Amer: 85 mL/min
Hgb A1c MFr Bld: 6.4 % — ABNORMAL HIGH (ref 4.6–6.0)

## 2007-12-15 ENCOUNTER — Ambulatory Visit: Payer: Self-pay | Admitting: Internal Medicine

## 2008-02-12 ENCOUNTER — Telehealth: Payer: Self-pay | Admitting: Internal Medicine

## 2008-02-20 ENCOUNTER — Ambulatory Visit: Payer: Self-pay | Admitting: Internal Medicine

## 2008-02-26 ENCOUNTER — Ambulatory Visit: Payer: Self-pay | Admitting: Internal Medicine

## 2008-02-26 LAB — CONVERTED CEMR LAB
BUN: 13 mg/dL (ref 6–23)
Calcium: 9.2 mg/dL (ref 8.4–10.5)
Creatinine, Ser: 1 mg/dL (ref 0.4–1.5)
GFR calc Af Amer: 103 mL/min
GFR calc non Af Amer: 85 mL/min
Hgb A1c MFr Bld: 7.1 % — ABNORMAL HIGH (ref 4.6–6.0)

## 2008-03-05 ENCOUNTER — Ambulatory Visit: Payer: Self-pay | Admitting: Internal Medicine

## 2008-03-05 DIAGNOSIS — E119 Type 2 diabetes mellitus without complications: Secondary | ICD-10-CM | POA: Insufficient documentation

## 2008-03-20 ENCOUNTER — Ambulatory Visit (HOSPITAL_COMMUNITY): Admission: RE | Admit: 2008-03-20 | Discharge: 2008-03-20 | Payer: Self-pay | Admitting: Internal Medicine

## 2008-03-25 ENCOUNTER — Encounter: Payer: Self-pay | Admitting: Internal Medicine

## 2008-03-25 LAB — CBC WITH DIFFERENTIAL/PLATELET
Basophils Absolute: 0 10*3/uL (ref 0.0–0.1)
Eosinophils Absolute: 0.1 10*3/uL (ref 0.0–0.5)
HCT: 41.5 % (ref 38.7–49.9)
HGB: 14.1 g/dL (ref 13.0–17.1)
MCH: 28.1 pg (ref 28.0–33.4)
MCV: 82.6 fL (ref 81.6–98.0)
MONO%: 6 % (ref 0.0–13.0)
NEUT#: 4.5 10*3/uL (ref 1.5–6.5)
NEUT%: 60.7 % (ref 40.0–75.0)
RDW: 13.4 % (ref 11.2–14.6)
lymph#: 2.3 10*3/uL (ref 0.9–3.3)

## 2008-03-25 LAB — COMPREHENSIVE METABOLIC PANEL
Albumin: 4.2 g/dL (ref 3.5–5.2)
BUN: 14 mg/dL (ref 6–23)
Calcium: 9.1 mg/dL (ref 8.4–10.5)
Chloride: 102 mEq/L (ref 96–112)
Creatinine, Ser: 1.1 mg/dL (ref 0.40–1.50)
Glucose, Bld: 168 mg/dL — ABNORMAL HIGH (ref 70–99)
Potassium: 4.5 mEq/L (ref 3.5–5.3)

## 2008-05-16 ENCOUNTER — Ambulatory Visit (HOSPITAL_COMMUNITY): Payer: Self-pay | Admitting: Psychiatry

## 2008-05-24 ENCOUNTER — Ambulatory Visit (HOSPITAL_COMMUNITY): Payer: Self-pay | Admitting: Psychiatry

## 2008-05-28 ENCOUNTER — Ambulatory Visit: Payer: Self-pay | Admitting: Internal Medicine

## 2008-05-28 LAB — CONVERTED CEMR LAB
ALT: 28 units/L (ref 0–53)
Alkaline Phosphatase: 74 units/L (ref 39–117)
Bilirubin, Direct: 0.1 mg/dL (ref 0.0–0.3)
CO2: 33 meq/L — ABNORMAL HIGH (ref 19–32)
Calcium: 9.3 mg/dL (ref 8.4–10.5)
Glucose, Bld: 117 mg/dL — ABNORMAL HIGH (ref 70–99)
Potassium: 4.2 meq/L (ref 3.5–5.1)
Sodium: 141 meq/L (ref 135–145)
Total Bilirubin: 0.7 mg/dL (ref 0.3–1.2)
Total CHOL/HDL Ratio: 2.9
Total Protein: 7.3 g/dL (ref 6.0–8.3)

## 2008-05-31 ENCOUNTER — Ambulatory Visit (HOSPITAL_COMMUNITY): Payer: Self-pay | Admitting: Psychiatry

## 2008-06-04 ENCOUNTER — Ambulatory Visit: Payer: Self-pay | Admitting: Internal Medicine

## 2008-06-07 ENCOUNTER — Ambulatory Visit (HOSPITAL_COMMUNITY): Payer: Self-pay | Admitting: Psychiatry

## 2008-06-14 ENCOUNTER — Ambulatory Visit (HOSPITAL_COMMUNITY): Payer: Self-pay | Admitting: Psychiatry

## 2008-06-20 ENCOUNTER — Ambulatory Visit (HOSPITAL_COMMUNITY): Payer: Self-pay | Admitting: Psychiatry

## 2008-06-27 ENCOUNTER — Ambulatory Visit (HOSPITAL_COMMUNITY): Payer: Self-pay | Admitting: Licensed Clinical Social Worker

## 2008-07-05 ENCOUNTER — Emergency Department (HOSPITAL_COMMUNITY): Admission: EM | Admit: 2008-07-05 | Discharge: 2008-07-06 | Payer: Self-pay | Admitting: Emergency Medicine

## 2008-07-06 ENCOUNTER — Ambulatory Visit: Payer: Self-pay | Admitting: *Deleted

## 2008-07-06 ENCOUNTER — Inpatient Hospital Stay (HOSPITAL_COMMUNITY): Admission: RE | Admit: 2008-07-06 | Discharge: 2008-07-08 | Payer: Self-pay | Admitting: *Deleted

## 2008-07-17 ENCOUNTER — Ambulatory Visit (HOSPITAL_COMMUNITY): Payer: Self-pay | Admitting: Licensed Clinical Social Worker

## 2008-08-15 ENCOUNTER — Ambulatory Visit (HOSPITAL_COMMUNITY): Payer: Self-pay | Admitting: Licensed Clinical Social Worker

## 2008-08-23 ENCOUNTER — Ambulatory Visit (HOSPITAL_COMMUNITY): Payer: Self-pay | Admitting: Licensed Clinical Social Worker

## 2008-08-27 ENCOUNTER — Ambulatory Visit: Payer: Self-pay | Admitting: Internal Medicine

## 2008-08-29 ENCOUNTER — Ambulatory Visit (HOSPITAL_COMMUNITY): Admission: RE | Admit: 2008-08-29 | Discharge: 2008-08-29 | Payer: Self-pay | Admitting: Internal Medicine

## 2008-09-03 ENCOUNTER — Ambulatory Visit: Payer: Self-pay | Admitting: Internal Medicine

## 2008-09-03 LAB — CONVERTED CEMR LAB
ALT: 26 units/L (ref 0–53)
AST: 23 units/L (ref 0–37)
Bilirubin, Direct: 0.1 mg/dL (ref 0.0–0.3)
Hgb A1c MFr Bld: 7 % — ABNORMAL HIGH (ref 4.6–6.5)
Total Bilirubin: 0.6 mg/dL (ref 0.3–1.2)
Total Protein: 7.2 g/dL (ref 6.0–8.3)

## 2008-09-05 ENCOUNTER — Encounter: Payer: Self-pay | Admitting: Internal Medicine

## 2008-09-05 LAB — CBC WITH DIFFERENTIAL/PLATELET
BASO%: 0.6 % (ref 0.0–2.0)
Basophils Absolute: 0 10*3/uL (ref 0.0–0.1)
EOS%: 5.7 % (ref 0.0–7.0)
Eosinophils Absolute: 0.3 10*3/uL (ref 0.0–0.5)
HCT: 39.7 % (ref 38.4–49.9)
HGB: 13.8 g/dL (ref 13.0–17.1)
LYMPH%: 37.6 % (ref 14.0–49.0)
MCH: 28.4 pg (ref 27.2–33.4)
MCHC: 34.8 g/dL (ref 32.0–36.0)
MCV: 81.5 fL (ref 79.3–98.0)
MONO#: 0.5 10*3/uL (ref 0.1–0.9)
MONO%: 7.9 % (ref 0.0–14.0)
NEUT#: 2.8 10*3/uL (ref 1.5–6.5)
NEUT%: 48.2 % (ref 39.0–75.0)
Platelets: 238 10*3/uL (ref 140–400)
RBC: 4.87 10*6/uL (ref 4.20–5.82)
RDW: 13.5 % (ref 11.0–14.6)
WBC: 5.7 10*3/uL (ref 4.0–10.3)
lymph#: 2.1 10*3/uL (ref 0.9–3.3)

## 2008-09-05 LAB — COMPREHENSIVE METABOLIC PANEL
ALT: 21 U/L (ref 0–53)
AST: 16 U/L (ref 0–37)
Alkaline Phosphatase: 77 U/L (ref 39–117)
BUN: 15 mg/dL (ref 6–23)
Calcium: 9.3 mg/dL (ref 8.4–10.5)
Chloride: 101 mEq/L (ref 96–112)
Creatinine, Ser: 1.04 mg/dL (ref 0.40–1.50)
Total Bilirubin: 0.3 mg/dL (ref 0.3–1.2)

## 2008-09-06 ENCOUNTER — Ambulatory Visit (HOSPITAL_COMMUNITY): Payer: Self-pay | Admitting: Licensed Clinical Social Worker

## 2008-09-10 ENCOUNTER — Ambulatory Visit: Payer: Self-pay | Admitting: Internal Medicine

## 2008-09-24 ENCOUNTER — Encounter: Payer: Self-pay | Admitting: *Deleted

## 2008-09-26 ENCOUNTER — Ambulatory Visit (HOSPITAL_COMMUNITY): Payer: Self-pay | Admitting: Licensed Clinical Social Worker

## 2008-10-07 ENCOUNTER — Ambulatory Visit (HOSPITAL_COMMUNITY): Payer: Self-pay | Admitting: Licensed Clinical Social Worker

## 2008-11-05 ENCOUNTER — Telehealth: Payer: Self-pay | Admitting: Internal Medicine

## 2008-12-13 ENCOUNTER — Ambulatory Visit: Payer: Self-pay | Admitting: Internal Medicine

## 2008-12-13 DIAGNOSIS — E66813 Obesity, class 3: Secondary | ICD-10-CM | POA: Insufficient documentation

## 2008-12-13 LAB — CONVERTED CEMR LAB
CO2: 32 meq/L (ref 19–32)
Calcium: 9.3 mg/dL (ref 8.4–10.5)
Creatinine, Ser: 1.1 mg/dL (ref 0.4–1.5)
GFR calc non Af Amer: 75.61 mL/min (ref >60)
Glucose, Bld: 188 mg/dL — ABNORMAL HIGH (ref 70–99)
Sodium: 141 meq/L (ref 135–145)

## 2009-03-12 ENCOUNTER — Ambulatory Visit: Payer: Self-pay | Admitting: Internal Medicine

## 2009-03-12 LAB — CBC WITH DIFFERENTIAL/PLATELET
BASO%: 0.8 % (ref 0.0–2.0)
Basophils Absolute: 0.1 10*3/uL (ref 0.0–0.1)
EOS%: 2.8 % (ref 0.0–7.0)
Eosinophils Absolute: 0.2 10*3/uL (ref 0.0–0.5)
HCT: 42.3 % (ref 38.4–49.9)
HGB: 14.4 g/dL (ref 13.0–17.1)
LYMPH%: 27.6 % (ref 14.0–49.0)
MCH: 28.3 pg (ref 27.2–33.4)
MCHC: 34.1 g/dL (ref 32.0–36.0)
MCV: 82.9 fL (ref 79.3–98.0)
MONO#: 0.4 10*3/uL (ref 0.1–0.9)
MONO%: 6.3 % (ref 0.0–14.0)
NEUT#: 4.4 10*3/uL (ref 1.5–6.5)
NEUT%: 62.5 % (ref 39.0–75.0)
Platelets: 257 10*3/uL (ref 140–400)
RBC: 5.1 10*6/uL (ref 4.20–5.82)
RDW: 13.6 % (ref 11.0–14.6)
WBC: 7 10*3/uL (ref 4.0–10.3)
lymph#: 1.9 10*3/uL (ref 0.9–3.3)

## 2009-03-12 LAB — COMPREHENSIVE METABOLIC PANEL
Albumin: 4.1 g/dL (ref 3.5–5.2)
BUN: 15 mg/dL (ref 6–23)
CO2: 28 mEq/L (ref 19–32)
Glucose, Bld: 179 mg/dL — ABNORMAL HIGH (ref 70–99)
Potassium: 4.3 mEq/L (ref 3.5–5.3)
Sodium: 137 mEq/L (ref 135–145)
Total Protein: 7.5 g/dL (ref 6.0–8.3)

## 2009-03-12 LAB — LACTATE DEHYDROGENASE: LDH: 125 U/L (ref 94–250)

## 2009-03-13 ENCOUNTER — Ambulatory Visit (HOSPITAL_COMMUNITY): Admission: RE | Admit: 2009-03-13 | Discharge: 2009-03-13 | Payer: Self-pay | Admitting: Internal Medicine

## 2009-03-14 ENCOUNTER — Ambulatory Visit: Payer: Self-pay | Admitting: Internal Medicine

## 2009-03-19 ENCOUNTER — Encounter: Payer: Self-pay | Admitting: Internal Medicine

## 2009-05-14 ENCOUNTER — Encounter: Payer: Self-pay | Admitting: Internal Medicine

## 2009-08-04 ENCOUNTER — Ambulatory Visit: Payer: Self-pay | Admitting: Internal Medicine

## 2009-08-04 LAB — CONVERTED CEMR LAB
ALT: 41 units/L (ref 0–53)
AST: 30 units/L (ref 0–37)
Albumin: 4.4 g/dL (ref 3.5–5.2)
Blood Glucose, Fingerstick: 120
Creatinine,U: 207.1 mg/dL
HDL: 52 mg/dL (ref >39.00)
Hgb A1c MFr Bld: 8.1 % — ABNORMAL HIGH (ref 4.6–6.5)
Microalb Creat Ratio: 0.5 mg/g (ref 0.0–30.0)
Microalb, Ur: 1.1 mg/dL (ref 0.0–1.9)
Total Protein: 7.5 g/dL (ref 6.0–8.3)

## 2009-08-18 LAB — HM DIABETES EYE EXAM: HM Diabetic Eye Exam: NORMAL

## 2009-09-11 ENCOUNTER — Ambulatory Visit: Payer: Self-pay | Admitting: Internal Medicine

## 2009-09-15 ENCOUNTER — Ambulatory Visit (HOSPITAL_COMMUNITY): Admission: RE | Admit: 2009-09-15 | Discharge: 2009-09-15 | Payer: Self-pay | Admitting: Internal Medicine

## 2009-09-15 LAB — CBC WITH DIFFERENTIAL/PLATELET
BASO%: 0.3 % (ref 0.0–2.0)
EOS%: 2.5 % (ref 0.0–7.0)
MCH: 28.4 pg (ref 27.2–33.4)
MCHC: 34.5 g/dL (ref 32.0–36.0)
MCV: 82.4 fL (ref 79.3–98.0)
MONO%: 7 % (ref 0.0–14.0)
RBC: 5.07 10*6/uL (ref 4.20–5.82)
RDW: 13.4 % (ref 11.0–14.6)
lymph#: 2.4 10*3/uL (ref 0.9–3.3)

## 2009-09-15 LAB — COMPREHENSIVE METABOLIC PANEL
ALT: 34 U/L (ref 0–53)
AST: 23 U/L (ref 0–37)
Albumin: 4.6 g/dL (ref 3.5–5.2)
Alkaline Phosphatase: 79 U/L (ref 39–117)
BUN: 18 mg/dL (ref 6–23)
Calcium: 9.4 mg/dL (ref 8.4–10.5)
Chloride: 101 mEq/L (ref 96–112)
Potassium: 4.4 mEq/L (ref 3.5–5.3)

## 2009-09-17 ENCOUNTER — Encounter: Payer: Self-pay | Admitting: Internal Medicine

## 2009-09-25 ENCOUNTER — Encounter: Payer: Self-pay | Admitting: Internal Medicine

## 2009-10-03 ENCOUNTER — Telehealth: Payer: Self-pay | Admitting: Internal Medicine

## 2010-01-27 ENCOUNTER — Ambulatory Visit: Payer: Self-pay | Admitting: Internal Medicine

## 2010-01-27 LAB — CONVERTED CEMR LAB
ALT: 41 units/L (ref 0–53)
BUN: 13 mg/dL (ref 6–23)
Basophils Relative: 0.5 % (ref 0.0–3.0)
CO2: 29 meq/L (ref 19–32)
Chloride: 97 meq/L (ref 96–112)
Eosinophils Relative: 2.6 % (ref 0.0–5.0)
Glucose, Urine, Semiquant: NEGATIVE
HCT: 42.8 % (ref 39.0–52.0)
Lymphs Abs: 2.2 10*3/uL (ref 0.7–4.0)
MCHC: 33.4 g/dL (ref 30.0–36.0)
MCV: 83.9 fL (ref 78.0–100.0)
Monocytes Absolute: 0.5 10*3/uL (ref 0.1–1.0)
PSA: 0.46 ng/mL (ref 0.10–4.00)
Platelets: 255 10*3/uL (ref 150.0–400.0)
Potassium: 4.7 meq/L (ref 3.5–5.1)
Specific Gravity, Urine: >=1.03
TSH: 2.05 microintl units/mL (ref 0.35–5.50)
Total Bilirubin: 0.7 mg/dL (ref 0.3–1.2)
Total Protein: 7.2 g/dL (ref 6.0–8.3)
VLDL: 47.2 mg/dL — ABNORMAL HIGH (ref 0.0–40.0)
WBC Urine, dipstick: NEGATIVE
WBC: 7 10*3/uL (ref 4.5–10.5)
pH: 5.5

## 2010-02-03 ENCOUNTER — Encounter: Payer: Self-pay | Admitting: Internal Medicine

## 2010-02-03 ENCOUNTER — Ambulatory Visit: Payer: Self-pay | Admitting: Internal Medicine

## 2010-02-03 LAB — HM DIABETES FOOT EXAM

## 2010-03-10 ENCOUNTER — Ambulatory Visit: Payer: Self-pay | Admitting: Internal Medicine

## 2010-03-12 ENCOUNTER — Ambulatory Visit (HOSPITAL_COMMUNITY)
Admission: RE | Admit: 2010-03-12 | Discharge: 2010-03-12 | Payer: Self-pay | Source: Home / Self Care | Attending: Internal Medicine | Admitting: Internal Medicine

## 2010-03-12 LAB — COMPREHENSIVE METABOLIC PANEL
AST: 28 U/L (ref 0–37)
Alkaline Phosphatase: 85 U/L (ref 39–117)
BUN: 16 mg/dL (ref 6–23)
Creatinine, Ser: 1.07 mg/dL (ref 0.40–1.50)
Glucose, Bld: 303 mg/dL — ABNORMAL HIGH (ref 70–99)
Total Bilirubin: 0.5 mg/dL (ref 0.3–1.2)

## 2010-03-12 LAB — CBC WITH DIFFERENTIAL/PLATELET
Basophils Absolute: 0 10*3/uL (ref 0.0–0.1)
EOS%: 2.5 % (ref 0.0–7.0)
Eosinophils Absolute: 0.2 10*3/uL (ref 0.0–0.5)
HGB: 14.7 g/dL (ref 13.0–17.1)
LYMPH%: 31.2 % (ref 14.0–49.0)
MCH: 27.9 pg (ref 27.2–33.4)
MCV: 81.2 fL (ref 79.3–98.0)
MONO%: 5.7 % (ref 0.0–14.0)
NEUT#: 4.1 10*3/uL (ref 1.5–6.5)
Platelets: 214 10*3/uL (ref 140–400)
RBC: 5.26 10*6/uL (ref 4.20–5.82)
RDW: 12.7 % (ref 11.0–14.6)

## 2010-03-17 ENCOUNTER — Encounter: Payer: Self-pay | Admitting: Internal Medicine

## 2010-04-17 ENCOUNTER — Other Ambulatory Visit: Payer: Self-pay | Admitting: Internal Medicine

## 2010-04-17 DIAGNOSIS — C439 Malignant melanoma of skin, unspecified: Secondary | ICD-10-CM

## 2010-04-19 ENCOUNTER — Encounter: Payer: Self-pay | Admitting: Internal Medicine

## 2010-04-28 NOTE — Assessment & Plan Note (Signed)
Summary: cpx//ccm----PT RSC (BMP) // RS   Vital Signs:  Patient profile:   50 year old male Height:      70 inches Weight:      275 pounds BMI:     39.60 Temp:     98.2 degrees F oral Pulse rate:   78 / minute Resp:     16 per minute BP sitting:   136 / 88  (left arm)  Vitals Entered By: Willy Eddy, LPN (February 03, 2010 11:37 AM) CC: CPX-, NO COLONOSCOPY Is Patient Diabetic? Yes Did you bring your meter with you today? No   Primary Care Provider:  Stacie Glaze MD  CC:  CPX- and NO COLONOSCOPY.  History of Present Illness: The pt was asked about all immunizations, health maint. services that are appropriate to their age and was given guidance on diet exercize  and weight management   The pt has noted increased CBG's and has been on levamir 20 u at HS and janumet 50/1000 one a day The A1C has increased and the triglycerides are up he has been exercizing but has not followed the low carb diet as well as should   Preventive Screening-Counseling & Management  Alcohol-Tobacco     Smoking Status: quit  Current Problems (verified): 1)  Obesity, Morbid  (ICD-278.01) 2)  Diabetes Mellitus, Type II  (ICD-250.00) 3)  Family History of Cad Male 1st Degree Relative <50  (ICD-V17.3) 4)  Depression  (ICD-311) 5)  Melanoma, Malignant, 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 One By Mouth Day 2)  Pantoprazole Sodium 40 Mg Tbec (Pantoprazole Sodium) .... One By Mouth Daily 3)  Amlodipine Besylate 5 Mg Tabs (Amlodipine Besylate) .... Once Daily 4)  Levemir Flexpen 100 Unit/ml  Soln (Insulin Detemir) .... 20 Units At Bedtime If The Am Glucoses Do Not Drop To Below 150 Thjen Add 5 U of Levimir ( Total 25) 5)  Freestyle Lite   Strp (Glucose Blood) .... Check Blood Glucose Three Times A Day  ( Insulin  Requiring) 6)  Kombiglyze Xr 07-998 Mg Xr24h-Tab (Saxagliptin-Metformin) .... One By Mouth Daily 7)  Pristiq 50 Mg Xr24h-Tab (Desvenlafaxine Succinate) .Marland Kitchen.. 1 Once Daily 8)  Wellbutrin Xl 150 Mg Xr24h-Tab (Bupropion Hcl) .... 3 Once Daily 9)  Abilify 5 Mg Tabs (Aripiprazole) .Marland Kitchen.. 1 Once Daily  Allergies (verified): No Known Drug Allergies  Past History:  Family History: Last updated: 06/02/2007 father  Family History of CAD Male 1st degree relative <50 smoker mother Family History of Arthritis Family History High cholesterol  Social History: Last updated: 12/16/2006 Married Former Smoker  Risk Factors: Smoking Status: quit (02/03/2010)  Past medical, surgical, family and social histories (including risk factors) reviewed, and no changes noted (except as noted below).  Past Medical History: Reviewed history from 03/05/2008 and no changes required. GERD Hyperlipidemia dismetabolic syndrome 995.2 Diabetes mellitus, type II  Past Surgical History: Reviewed history from 06/01/2007 and no changes required. melanoma  Family History: Reviewed history from 06/02/2007 and no changes required. father  Family History of CAD Male 1st degree relative <50 smoker mother Family History of Arthritis Family History High cholesterol  Social History: Reviewed history from 12/16/2006 and no changes required. Married Former Smoker  Review of Systems  The patient denies anorexia, fever, weight loss, weight gain, vision loss, decreased hearing, hoarseness,  chest pain, syncope, dyspnea on exertion, peripheral edema, prolonged cough, headaches, hemoptysis, abdominal pain, melena, hematochezia, severe indigestion/heartburn, hematuria, incontinence, genital sores, muscle weakness, suspicious skin lesions, transient blindness, difficulty walking, depression, unusual weight change, abnormal bleeding, enlarged lymph nodes, angioedema, and breast masses.    Physical Exam  General:   Well-developed,well-nourished,in no acute distress; alert,appropriate and cooperative throughout examination Head:  scaring from head and neck surgerydiffuse alopecia.   Eyes:  pupils equal and pupils round.   Ears:  canal clear on the right Nose:  no external deformity and no nasal discharge.   Mouth:  good dentition and pharynx pink and moist.   Neck:  No deformities, masses, or tenderness noted. Lungs:  Normal respiratory effort, chest expands symmetrically. Lungs are clear to auscultation, no crackles or wheezes. Heart:  Normal rate and regular rhythm. S1 and S2 normal without gallop, murmur, click, rub or other extra sounds. Abdomen:  soft, non-tender, and normal bowel sounds.   Rectal:  no external abnormalities and normal sphincter tone.   Prostate:  no gland enlargement and no nodules.   Pulses:  R and L carotid,radial,femoral,dorsalis pedis and posterior tibial pulses are full and equal bilaterally Neurologic:  alert & oriented X3 and finger-to-nose normal.    Diabetes Management Exam:    Foot Exam (with socks and/or shoes not present):       Sensory-Pinprick/Light touch:          Left medial foot (L-4): diminished          Left dorsal foot (L-5): diminished          Left lateral foot (S-1): diminished          Right medial foot (L-4): diminished          Right dorsal foot (L-5): diminished          Right lateral foot (S-1): diminished   Impression & Recommendations:  Problem # 1:  OBESITY, MORBID (ICD-278.01)  weigh t is decreased  Ht: 70 (02/03/2010)   Wt: 275 (02/03/2010)   BMI: 39.60 (02/03/2010)  Problem # 2:  PREVENTIVE HEALTH CARE (ICD-V70.0) The pt was asked about all immunizations, health maint. services that are appropriate to their age and was given guidance on diet exercize  and weight management  Orders: EKG w/ Interpretation (93000)  Td Booster: Tdap (03/29/2006)   Flu Vax: Fluvax 3+ (03/14/2009)   Chol: 175 (08/04/2009)   HDL: 52.00 (08/04/2009)   LDL:  63 (05/28/2008)   TG: 147 (05/28/2008) TSH: 0.89 (08/31/2006)   HgbA1C: 8.1 (08/04/2009)    Discussed using sunscreen, use of alcohol, drug use, self testicular exam, routine dental care, routine eye care, routine physical exam, seat belts, multiple vitamins, osteoporosis prevention, adequate calcium intake in diet, and recommendations for immunizations.  Discussed exercise and checking cholesterol.  Discussed gun safety, safe sex, and contraception. Also recommend checking PSA.  Problem # 3:  DIABETES MELLITUS, TYPE II (ICD-250.00) plan for change of medications and to add levimir His updated medication list for this problem includes:    Levemir Flexpen 100 Unit/ml Soln (Insulin detemir) .Marland Kitchen... 20 units at bedtime if the am glucoses do not drop to below 150 thjen add 5 u of levimir ( total 25)    Kombiglyze Xr 07-998 Mg Xr24h-tab (Saxagliptin-metformin) ..... One by mouth daily  Labs Reviewed: Creat: 1.1 (12/13/2008)     Last Eye Exam: normal (08/18/2009) Reviewed HgBA1c results: 8.1 (08/04/2009)  7.6 (03/14/2009)  Complete Medication List: 1)  Crestor 10 Mg Tabs (  Rosuvastatin calcium) .... One by mouth day 2)  Pantoprazole Sodium 40 Mg Tbec (Pantoprazole sodium) .... One by mouth daily 3)  Amlodipine Besylate 5 Mg Tabs (Amlodipine besylate) .... Once daily 4)  Levemir Flexpen 100 Unit/ml Soln (Insulin detemir) .... 20 units at bedtime if the am glucoses do not drop to below 150 thjen add 5 u of levimir ( total 25) 5)  Freestyle Lite Strp (Glucose blood) .... Check blood glucose three times a day  ( insulin requiring) 6)  Kombiglyze Xr 07-998 Mg Xr24h-tab (Saxagliptin-metformin) .... One by mouth daily 7)  Pristiq 50 Mg Xr24h-tab (Desvenlafaxine succinate) .Marland Kitchen.. 1 once daily 8)  Wellbutrin Xl 150 Mg Xr24h-tab (Bupropion hcl) .... 3 once daily 9)  Abilify 5 Mg Tabs (Aripiprazole) .Marland Kitchen.. 1 once daily  Patient Instructions: 1)  Please schedule a follow-up appointment in 3 months. 2)  HbgA1C  prior to visit, ICD-9:250.00 3)  you have been referred for a colonoscopy Prescriptions: KOMBIGLYZE XR 07-998 MG XR24H-TAB (SAXAGLIPTIN-METFORMIN) one by mouth daily  #90 x 3   Entered and Authorized by:   Stacie Glaze MD   Signed by:   Stacie Glaze MD on 02/03/2010   Method used:   Print then Give to Patient   RxID:   [id number]    Orders Added: 1)  EKG w/ Interpretation [93000] 2)  Est. Patient 40-64 years [99396] 3)  Est. Patient Level III [17616]

## 2010-04-28 NOTE — Assessment & Plan Note (Signed)
Summary: follow up on diabetes/cjr   Vital Signs:  Patient profile:   50 year old male Height:      70 inches Weight:      280 pounds BMI:     40.32 Temp:     98.2 degrees F oral Pulse rate:   76 / minute Resp:     14 per minute BP sitting:   140 / 84  (left arm) Cuff size:   large  Vitals Entered By: Willy Eddy, LPN (Aug 04, 9145 8:19 AM) CC: roa- fasting this am, Lipid Management CBG Result 120   CC:  roa- fasting this am and Lipid Management.  History of Present Illness: The pt has Stage 3 melanoma with 6 month CT scan ( oncology orders) and has been stable stable pychology vIsit with med changes noted ( ABILIFY HAS HELPED) DM the pr feels that his CBG's have bee in the 120 rnage no hypoglycemia  measures but has had at least two episodes of mild hyproglcemia symptoms tolerating medications well  no med changes     Lipid Management History:      Positive NCEP/ATP III risk factors include male age 29 years old or older and diabetes.  Negative NCEP/ATP III risk factors include non-tobacco-user status, non-hypertensive, no ASHD (atherosclerotic heart disease), no prior stroke/TIA, no peripheral vascular disease, and no history of aortic aneurysm.     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) 8)  Disorder, Dysmetabolic Syndrome X  (ICD-277.7) 9)  Hyperlipidemia  (ICD-272.4) 10)  Gerd  (ICD-530.81)  Current Problems (verified): 1)  Obesity, Morbid  (ICD-278.01) 2)  Diabetes Mellitus, Type II  (ICD-250.00) 3)  Family History of Cad Male 1st Degree  Relative <50  (ICD-V17.3) 4)  Diabetes Mellitus, Type II, Uncontrolled  (ICD-250.02) 5)  Depression  (ICD-311) 6)  Melanoma, Malignant, 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 One By Mouth Day 2)  Pantoprazole Sodium 40 Mg Tbec (Pantoprazole Sodium) .... One By Mouth Daily 3)  Amlodipine Besylate 5 Mg Tabs (Amlodipine Besylate) .... Once Daily 4)  Levemir Flexpen 100 Unit/ml  Soln (Insulin Detemir) .... 20 Units At Bedtime 5)  Freestyle Lite   Strp (Glucose Blood) .... Check Blood Glucose Three Times A Day  ( Insulin Requiring) 6)  Janumet 50-1000 Mg Tabs (Sitagliptin-Metformin Hcl) .... One By Mouth Daily 7)  Pristiq 50 Mg Xr24h-Tab (Desvenlafaxine Succinate) .Marland Kitchen.. 1 Once Daily 8)  Wellbutrin Xl 150 Mg Xr24h-Tab (Bupropion Hcl) .... 3 Once Daily 9)  Abilify 5 Mg Tabs (Aripiprazole) .Marland Kitchen.. 1 Once Daily  Current Medications (verified): 1)  Crestor 10 Mg Tabs (Rosuvastatin Calcium) .... One By Mouth Day 2)  Pantoprazole Sodium 40 Mg Tbec (Pantoprazole Sodium) .... One By Mouth Daily 3)  Amlodipine Besylate 5 Mg Tabs (Amlodipine Besylate) .... Once Daily 4)  Levemir Flexpen 100 Unit/ml  Soln (Insulin Detemir) .... 20 Units  At Bedtime 5)  Freestyle Lite   Strp (Glucose Blood) .... Check Blood Glucose Three Times A Day  ( Insulin Requiring) 6)  Janumet 50-1000 Mg Tabs (Sitagliptin-Metformin Hcl) .... One By Mouth Daily 7)  Pristiq 50 Mg Xr24h-Tab (Desvenlafaxine Succinate) .Marland Kitchen.. 1 Once Daily 8)  Wellbutrin Xl 150 Mg Xr24h-Tab (Bupropion Hcl) .... 3 Once Daily 9)  Abilify 5 Mg Tabs (Aripiprazole) .Marland Kitchen.. 1 Once Daily  Allergies (verified): No Known Drug Allergies  Past History:  Family History: Last updated: 06/02/2007 father  Family History of CAD Male 1st degree  relative <50 smoker mother Family History of Arthritis Family History High cholesterol  Social History: Last updated: 12/16/2006 Married Former Smoker  Risk Factors: Smoking Status: quit (08/04/2009)  Past medical, surgical, family and social histories (including risk factors) reviewed, and no changes noted (except as noted below).  Past Medical History: Reviewed history from 03/05/2008 and no changes required. GERD Hyperlipidemia dismetabolic syndrome 995.2 Diabetes mellitus, type II  Past Surgical History: Reviewed history from 06/01/2007 and no changes required. melanoma  Family History: Reviewed history from 06/02/2007 and no changes required. father  Family History of CAD Male 1st degree relative <50 smoker mother Family History of Arthritis Family History High cholesterol  Social History: Reviewed history from 12/16/2006 and no changes required. Married Former Smoker  Review of Systems  The patient denies anorexia, fever, weight loss, weight gain, vision loss, decreased hearing, hoarseness, chest pain, syncope, dyspnea on exertion, peripheral edema, prolonged cough, headaches, hemoptysis, abdominal pain, melena, hematochezia, severe indigestion/heartburn, hematuria, incontinence, genital sores, muscle weakness, suspicious skin lesions, transient blindness, difficulty walking, depression, unusual weight change, abnormal bleeding, enlarged lymph nodes, angioedema, breast masses, and testicular masses.    Physical Exam  General:  Well-developed,well-nourished,in no acute distress; alert,appropriate and cooperative throughout examination Head:  normocephalic and diffuse alopecia.   Eyes:  No corneal or conjunctival inflammation noted. EOMI. Perrla. Funduscopic exam benign, without hemorrhages, exudates or papilledema. Vision grossly normal. Ears:  canal clear on the right Nose:  no external deformity and no nasal discharge.   Mouth:  good dentition and pharynx  pink and moist.   Neck:  No deformities, masses, or tenderness noted. Lungs:  Normal respiratory effort, chest expands symmetrically. Lungs are clear to auscultation, no crackles or wheezes. Heart:  Normal rate and regular rhythm. S1 and S2 normal without gallop, murmur, click, rub or other extra sounds. Abdomen:  soft, non-tender, and normal bowel sounds.    Diabetes Management Exam:    Foot Exam (with socks and/or shoes not present):       Sensory-Pinprick/Light touch:          Left medial foot (L-4): normal          Left dorsal foot (L-5): normal          Left lateral foot (S-1): normal          Right medial foot (L-4): normal          Right dorsal foot (L-5): normal          Right lateral foot (S-1): normal       Sensory-Monofilament:          Left foot: normal          Right foot: normal       Inspection:          Left foot: normal          Right foot: normal       Nails:  Left foot: normal          Right foot: normal    Eye Exam:       Eye Exam done elsewhere          Date: 08/18/2009          Results: normal          Done by: yokum (groat)   Impression & Recommendations:  Problem # 1:  DIABETES MELLITUS, TYPE II (ICD-250.00)  His updated medication list for this problem includes:    Levemir Flexpen 100 Unit/ml Soln (Insulin detemir) .Marland Kitchen... 20 units at bedtime    Janumet 50-1000 Mg Tabs (Sitagliptin-metformin hcl) ..... One by mouth daily  Labs Reviewed: Creat: 1.1 (12/13/2008)     Last Eye Exam: No diabetic retinopathy.    (09/23/2008) Reviewed HgBA1c results: 7.6 (03/14/2009)  7.3 (12/13/2008)  Orders: Venipuncture (04540) TLB-A1C / Hgb A1C (Glycohemoglobin) (83036-A1C) TLB-Microalbumin/Creat Ratio, Urine (82043-MALB)  Problem # 2:  OBESITY, MORBID (ICD-278.01)  Ht: 70 (08/04/2009)   Wt: 280 (08/04/2009)   BMI: 40.32 (08/04/2009)  Problem # 3:  GERD (ICD-530.81)  His updated medication list for this problem includes:    Pantoprazole Sodium 40  Mg Tbec (Pantoprazole sodium) ..... One by mouth daily  Labs Reviewed: Hgb: 13.8 (08/31/2006)   Hct: 39.5 (08/31/2006)  Complete Medication List: 1)  Crestor 10 Mg Tabs (Rosuvastatin calcium) .... One by mouth day 2)  Pantoprazole Sodium 40 Mg Tbec (Pantoprazole sodium) .... One by mouth daily 3)  Amlodipine Besylate 5 Mg Tabs (Amlodipine besylate) .... Once daily 4)  Levemir Flexpen 100 Unit/ml Soln (Insulin detemir) .... 20 units at bedtime 5)  Freestyle Lite Strp (Glucose blood) .... Check blood glucose three times a day  ( insulin requiring) 6)  Janumet 50-1000 Mg Tabs (Sitagliptin-metformin hcl) .... One by mouth daily 7)  Pristiq 50 Mg Xr24h-tab (Desvenlafaxine succinate) .Marland Kitchen.. 1 once daily 8)  Wellbutrin Xl 150 Mg Xr24h-tab (Bupropion hcl) .... 3 once daily 9)  Abilify 5 Mg Tabs (Aripiprazole) .Marland Kitchen.. 1 once daily  Other Orders: TLB-Cholesterol, HDL (83718-HDL) TLB-Cholesterol, Direct LDL (83721-DIRLDL) TLB-Cholesterol, Total (82465-CHO) TLB-Hepatic/Liver Function Pnl (80076-HEPATIC)  Lipid Assessment/Plan:      Based on NCEP/ATP III, the patient's risk factor category is "history of diabetes".  The patient's lipid goals are as follows: Total cholesterol goal is 200; LDL cholesterol goal is 100; HDL cholesterol goal is 40; Triglyceride goal is 150.    Patient Instructions: 1)  Please schedule a follow-up appointment in 6 months.  CPX

## 2010-04-28 NOTE — Letter (Signed)
Summary: [facility]  [facility]   Imported By: Maryln Gottron 05/21/2009 11:29:19  _____________________________________________________________________  External Attachment:    Type:   Image     Comment:   External Document

## 2010-04-28 NOTE — Letter (Signed)
Summary: [facility]  [facility]   Imported By: Maryln Gottron 04/11/2009 13:16:26  _____________________________________________________________________  External Attachment:    Type:   Image     Comment:   External Document

## 2010-04-28 NOTE — Letter (Signed)
[facility]   Imported By: Maryln Gottron 10/06/2009 10:48:23  _____________________________________________________________________  External Attachment:    Type:   Image     Comment:   External Document

## 2010-04-28 NOTE — Progress Notes (Signed)
Summary: refills  Phone Note Refill Request Message from:  Fax from Pharmacy on October 03, 2009 11:06 AM  Refills Requested: Medication #1:  CRESTOR 10 MG TABS one by mouth day  Medication #2:  AMLODIPINE BESYLATE 5 MG TABS once daily Initial call taken by: Kern Reap CMA Duncan Dull),  October 03, 2009 11:06 AM    Prescriptions: AMLODIPINE BESYLATE 5 MG TABS (AMLODIPINE BESYLATE) once daily  #90 x 3   Entered by:   Kern Reap CMA (AAMA)   Authorized by:   Stacie Glaze MD   Signed by:   Kern Reap CMA (AAMA) on 10/03/2009   Method used:   Electronically to        [facility]* (mail-order)       [address] [place], [place]  [address]       Ph: [phone]       Fax: [phone]   RxID:   [id number] CRESTOR 10 MG TABS (ROSUVASTATIN CALCIUM) one by mouth day  #90 x 3   Entered by:   Kern Reap CMA (AAMA)   Authorized by:   Stacie Glaze MD   Signed by:   Kern Reap CMA (AAMA) on 10/03/2009   Method used:   Electronically to        [facility]* (mail-order)       [address] [place], [place]  [address]       Ph: [phone]       Fax: [phone]   RxID:   [id number]

## 2010-04-28 NOTE — Letter (Signed)
Summary: [facility]  [facility]   Imported By: Maryln Gottron 10/06/2009 10:49:43  _____________________________________________________________________  External Attachment:    Type:   Image     Comment:   External Document

## 2010-04-30 NOTE — Letter (Signed)
Summary: [facility]  [facility]   Imported By: Maryln Gottron 03/26/2010 13:38:47  _____________________________________________________________________  External Attachment:    Type:   Image     Comment:   External Document

## 2010-05-06 ENCOUNTER — Other Ambulatory Visit: Payer: BC Managed Care – PPO | Admitting: Internal Medicine

## 2010-05-06 DIAGNOSIS — E119 Type 2 diabetes mellitus without complications: Secondary | ICD-10-CM

## 2010-05-18 ENCOUNTER — Encounter: Payer: Self-pay | Admitting: Internal Medicine

## 2010-05-19 ENCOUNTER — Ambulatory Visit (INDEPENDENT_AMBULATORY_CARE_PROVIDER_SITE_OTHER): Payer: BC Managed Care – PPO | Admitting: Internal Medicine

## 2010-05-19 ENCOUNTER — Encounter: Payer: Self-pay | Admitting: Internal Medicine

## 2010-05-19 VITALS — BP 140/80 | HR 80 | Temp 98.8°F | Resp 16 | Ht 68.0 in | Wt 272.0 lb

## 2010-05-19 DIAGNOSIS — F329 Major depressive disorder, single episode, unspecified: Secondary | ICD-10-CM

## 2010-05-19 DIAGNOSIS — E119 Type 2 diabetes mellitus without complications: Secondary | ICD-10-CM

## 2010-05-19 DIAGNOSIS — F3289 Other specified depressive episodes: Secondary | ICD-10-CM

## 2010-05-19 DIAGNOSIS — E785 Hyperlipidemia, unspecified: Secondary | ICD-10-CM

## 2010-05-19 NOTE — Assessment & Plan Note (Signed)
The patient's A1c is increased at 10.7 he had ran out of the level near and did not get this refilled this is the probable explanation for this marked increase in his A1c.  When the patient was on Levemir he was taking 20 units at bedtime we'll resume the level of air with a sliding scale to increase it from 20-25-30 to get his fasting blood glucose is down to about 120 5 in the morning

## 2010-05-19 NOTE — Patient Instructions (Signed)
Resume 11 there with the samples that I gave you for 20 units at bedtime monitor your morning blood glucoses and if they remain above 150 increase that to 25 units if they continue to to remain above 150 increase that to 30 units and hold

## 2010-05-19 NOTE — Progress Notes (Signed)
  Subjective:    Patient ID: [name], male    DOB: 1960/08/15, 50 y.o.   MRN: [id number]  HPI social gains is a 50 year old white male who is followed for diabetes and esophageal reflux as well as his history of malignant melanoma. He ran out of his  Long-acting insulin approximately 2 months ago did not call our office for prescription or samples has noticed a gradual rise in his blood sugars to over 250 and this is reflected in an A1c of greater than 10.   he has not had any hyperosmolar state however he has noticed a severe increase in thirst. He denies any hypoglycemia. His weight has been stable.   His comorbid findings include severe depression and I feel that his depression plays a role in his self image and not seeking treatment when necessary we discussed these findings and ask him to discuss    Review of Systems  Constitutional: Negative for fever and fatigue.         morbid obesity  HENT: Negative for hearing loss, congestion, neck pain and postnasal drip.   Eyes: Negative for discharge, redness and visual disturbance.  Respiratory: Negative for cough, shortness of breath and wheezing.   Cardiovascular: Negative for leg swelling.  Gastrointestinal: Negative for abdominal pain, constipation and abdominal distention.  Genitourinary: Negative for urgency and frequency.  Musculoskeletal: Negative for joint swelling and arthralgias.  Skin: Negative for color change and rash.  Neurological: Negative for weakness and light-headedness.  Hematological: Negative for adenopathy.  Psychiatric/Behavioral: Negative for behavioral problems.       Objective:   Physical Exam  on physical examination he is a pleasant white male in no apparent distress his blood pressure was 140 or 80 he was afebrile his pulse was 80 his respiratory rate was 16 his weight is 272 pounds.   HEENT reveals pupils are equal round reactive light accommodation there is marked facial scarring both from patient  reconstructive surgery and from his melanoma surgery lung fields were clear to auscultation percussion heart examination revealed regular rate and rhythm abdomen soft and nontender extremity examination revealed no cyanosis clubbing or edema neurologically he was intact with equal grips and normal gait his judgment was normal he was moderately anxious end       Assessment & Plan:   extreme poor control diabetes due to noncompliance with medication. 2 probable examination samples of medication given the patient a compliance as well as careful instructions how to use a sliding scale was provided to increase the insulin as needed to achieve a fasting blood glucose less than 150

## 2010-06-14 LAB — POCT I-STAT, CHEM 8
BUN: 18 mg/dL (ref 6–23)
Calcium, Ion: 1.17 mmol/L (ref 1.12–1.32)
Creatinine, Ser: 1 mg/dL (ref 0.4–1.5)
Glucose, Bld: 159 mg/dL — ABNORMAL HIGH (ref 70–99)
Sodium: 138 mEq/L (ref 135–145)
TCO2: 29 mmol/L (ref 0–100)

## 2010-07-08 LAB — COMPREHENSIVE METABOLIC PANEL
AST: 27 U/L (ref 0–37)
Alkaline Phosphatase: 80 U/L (ref 39–117)
BUN: 9 mg/dL (ref 6–23)
CO2: 30 mEq/L (ref 19–32)
Chloride: 102 mEq/L (ref 96–112)
Creatinine, Ser: 1.07 mg/dL (ref 0.4–1.5)
GFR calc Af Amer: >60 mL/min (ref >60)
GFR calc non Af Amer: >60 mL/min (ref >60)
Potassium: 4.6 mEq/L (ref 3.5–5.1)
Total Bilirubin: 0.6 mg/dL (ref 0.3–1.2)

## 2010-07-08 LAB — RAPID URINE DRUG SCREEN, HOSP PERFORMED
Benzodiazepines: NOT DETECTED
Cocaine: NOT DETECTED
Opiates: NOT DETECTED

## 2010-07-08 LAB — GLUCOSE, CAPILLARY
Glucose-Capillary: 150 mg/dL — ABNORMAL HIGH (ref 70–99)
Glucose-Capillary: 155 mg/dL — ABNORMAL HIGH (ref 70–99)

## 2010-07-20 ENCOUNTER — Ambulatory Visit (INDEPENDENT_AMBULATORY_CARE_PROVIDER_SITE_OTHER): Payer: BC Managed Care – PPO | Admitting: Internal Medicine

## 2010-07-20 ENCOUNTER — Encounter: Payer: Self-pay | Admitting: Internal Medicine

## 2010-07-20 VITALS — BP 140/80 | HR 76 | Temp 98.5°F | Resp 16 | Ht 70.0 in | Wt 275.0 lb

## 2010-07-20 DIAGNOSIS — E785 Hyperlipidemia, unspecified: Secondary | ICD-10-CM

## 2010-07-20 DIAGNOSIS — F329 Major depressive disorder, single episode, unspecified: Secondary | ICD-10-CM

## 2010-07-20 DIAGNOSIS — E119 Type 2 diabetes mellitus without complications: Secondary | ICD-10-CM

## 2010-07-20 LAB — HEMOGLOBIN A1C: Hgb A1c MFr Bld: 11.6 % — ABNORMAL HIGH (ref 4.6–6.5)

## 2010-07-20 LAB — LIPID PANEL
Cholesterol: 182 mg/dL (ref 0–200)
Total CHOL/HDL Ratio: 4
Triglycerides: 327 mg/dL — ABNORMAL HIGH (ref 0.0–149.0)

## 2010-07-20 NOTE — Progress Notes (Signed)
  Subjective:    Patient ID: [name], male    DOB: 01/13/1961, 50 y.o.   MRN: [id number]  HPI  She presents for adult-onset diabetes hyperlipidemia morbid obesity and depression followup he states his diabetes hasn't been in better control with much improved CBGs we discussed his diet and weight he has gained weight since his last office visit. counterintuitive to better control his depression has been well controlled he is seeing his therapist on a regular basis he continues to take his Crestor as scheduled  Review of Systems  Constitutional: Negative for fever and fatigue.  HENT: Negative for hearing loss, congestion, neck pain and postnasal drip.   Eyes: Negative for discharge, redness and visual disturbance.  Respiratory: Negative for cough, shortness of breath and wheezing.   Cardiovascular: Negative for leg swelling.  Gastrointestinal: Negative for abdominal pain, constipation and abdominal distention.  Genitourinary: Negative for urgency and frequency.  Musculoskeletal: Negative for joint swelling and arthralgias.  Skin: Negative for color change and rash.  Neurological: Negative for weakness and light-headedness.  Hematological: Negative for adenopathy.  Psychiatric/Behavioral: Negative for behavioral problems.   Past Medical History  Diagnosis Date  . GERD (gastroesophageal reflux disease)   . Hyperlipidemia   . Metabolic syndrome   . Diabetes mellitus    Past Surgical History  Procedure Date  . Melanoma excision   . Tonsillectomy     age 44  . Reconstructive plastic surgery face     dog bite at age 53    reports that he quit smoking about 22 years ago. He does not have any smokeless tobacco history on file. He reports that he does not drink alcohol or use illicit drugs. family history includes Arthritis in his mother and Stroke in his father. No Known Allergies      Objective:   Physical Exam  Constitutional: He appears well-developed and well-nourished.    HENT:  Head: Normocephalic and atraumatic.  Eyes: Conjunctivae are normal. Pupils are equal, round, and reactive to light.  Neck: Normal range of motion. Neck supple.  Cardiovascular: Normal rate and regular rhythm.   Pulmonary/Chest: Effort normal and breath sounds normal.  Abdominal: Soft. Bowel sounds are normal.          Assessment & Plan:  DTs history of poor control CBGs would seem to indicate that the A1c will be improved.A1c today and consider referring him back for diabetic teaching if he believes his A1c is in control but that I CBGs and this is not the case.  Weight loss is needed and begin diabetic teaching it would be essential to help him follow a diet that would allow him to lose weight

## 2010-07-20 NOTE — Assessment & Plan Note (Signed)
3 pound weight gain with a prior weight loss trend over the past few visits resuming diet and exercise working on weight loss

## 2010-07-20 NOTE — Assessment & Plan Note (Signed)
Seeing dr Evelene Croon

## 2010-07-20 NOTE — Assessment & Plan Note (Signed)
He is doing well on his Crestor at stable at this time

## 2010-07-20 NOTE — Assessment & Plan Note (Signed)
States that his CBGs have been in the 120s recently last A1c was 10.7 so her hopeful that that'll be a significant reduction A1c when the monitor today He reports no incidents of hypoglycemia He is doing I examination and she

## 2010-08-11 NOTE — Op Note (Signed)
NAME:  OLON, RUSS NO.:  [id number]   MEDICAL RECORD NO.:  [id number]          PATIENT TYPE:  INP   LOCATION:  2899                         FACILITY:  [facility]   PHYSICIAN:  Kristine Garbe. 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   OPERATION:  Right radical neck dissection.   SURGEON:  Narda Bonds, M.D.   ANESTHESIA:  General endotracheal.   ESTIMATED BLOOD LOSS:  100 mL.   COMPLICATIONS:  None.   BRIEF CLINICAL NOTE:  [name] is a 50 year old gentleman who had a  melanoma removed from his right ear about a month ago with depth of 2.8  mm.  He subsequently underwent a wedge excision of the ear melanoma  along with a sentinel lymph node biopsy.  Lymph node was positive for  metastatic disease to a high right jugular node.  He is taken to the  operating room at this time for right radical neck dissection.   DESCRIPTION OF PROCEDURE:  After adequate endotracheal anesthesia, the  patient's right neck was prepped with Betadine solution and draped out  with sterile towels.  Of note, he had had a fair amount of scar tissue  in the right neck from previous trauma from a dog attack as a child.  This made dissection of the right neck a little bit more difficult,  especially superiorly and in finding the accessory nerve.  Subplatysmal  flaps elevated superiorly and posteriorly.  Dissection was carried down  superior to the inferior edge of the submandibular gland.  Submandibular  gland and submandibular lymph nodes were not removed as on lymphous  tenogram, there was no drainage of the lymphatic system to the  submandibular area.  The jugular nodes were involved in the  drainage  system as was the accessory nodes in the posterior neck.  Next, the  spinal accessory nerve was identified as it exited the inferior edge of  the sternocleidomastoid muscle.  This was preserved throughout the  dissection.  There was 1 enlarged spinal accessory node noted.  The  spinal accessory nerve was then dissected up through the  sternocleidomastoid muscle which was divided.  The superior aspect of  our dissection was carried out to right below the posterior belly of the  digastric muscle where the superior aspect of the spinal accessory nerve  was identified as was the jugular vein.  The jugular vein was dissected  out and divided and ligated with 2-0 silk sutures and a second suture  ligature.  Next, dissection was carried out in the inferior portion of  the neck.  The sternocleidomastoid muscle was dissected off of the  clavicle.  The omohyoid muscle was identified, and the jugular vein was  identified at its superior aspect of omohyoid muscle.  Jugular vein was  dissected out, again clamped and  ligated with 2-0 silk sutures and  suture ligatures and divided.  The vagus nerve and carotid artery were  identified and preserved.  Supraclavicular fat pad was dissected out and  included in the inferior aspect of our dissection.  Hemostasis was  obtained with electrocautery.  The neck dissection was dissected off of  the deep cervical fascia and sent as a specimen.  A single suture was  placed through the superior aspect of the neck dissection to mark the  neck dissection.  The 11th, 12th, and vagus nerve were identified and  preserved.  The carotid artery was preserved and the jugular vein along  with the sternocleidomastoid muscle was dissected out with the neck  dissection.  Hemostasis was obtained with cautery and 3-0 silk  ligatures.  There was 1 questionable additional inferior jugular node  which was dissected out and sent as a separate specimen.  This completed  the  dissection.  A single Surgivac drain was brought through a separate  stab incision in the supraclavicular area.  The wound was then closed  with 3-0 chromic sutures subcutaneously and staples to reapproximate the  skin edges.  Bacitracin ointment was applied.  The patient was awoken  from anesthesia and transferred to the recovery room postop doing well.   DISPOSITION:  Ridwan Bondy will be admitted for observation for the  next 2 days and plan probable discharge in 2 days.  He will receive  perioperative antibiotic Ancef.  He received 1 g Ancef IV  preoperatively.           ______________________________  Kristine Garbe Ezzard Standing, M.D.     CEN/MEDQ  D:  08/15/2006  T:  08/15/2006  Job:  [id number]   cc:   Stacie Glaze, MD  Lajuana Matte, MD  Hermelinda Medicus, M.D.

## 2010-08-11 NOTE — Assessment & Plan Note (Signed)
[facility]                         GASTROENTEROLOGY OFFICE NOTE   NAME:[name]                      MRN:          [id number]  DATE:03/20/2007                            DOB:          03-Jul-1960    OFFICE CONSULTATION:   REASON FOR CONSULTATION:  Suspected celiac disease.   HISTORY OF PRESENT ILLNESS:  [name] is a 50 year old white male who  I have seen in the past for GERD.  He underwent upper endoscopy in  September 2002 that showed a small hiatal hernia and reflux esophagitis.  He has frequent postprandial heartburn despite remaining on Aciphex on a  regular basis.  Unfortunately, he was diagnosed with metastatic melanoma  and he is being treated with interferon, followed by Dr. Arbutus Davis.  He  has a history of diarrhea and abdominal bloating that have worsened over  the past 10 years.  He has a family history of celiac disease in his  sister, who was recently diagnosed at age 63, and a maternal grandfather  diagnosed at age 64.  In addition, there is  a great-aunt with celiac  disease.  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  He has started a gluten-free diet  for the past 2 weeks and feels his diarrhea and bloating have  substantially improved.   FAMILY HISTORY:  Negative for colon cancer, colon polyps and  inflammatory bowel disease.   PAST MEDICAL HISTORY:  1. Hypertension.  2. Hyperlipidemia.  3. Dysmetabolic syndrome.  4. GERD.  5. Depression.  6. Metastatic melanoma.   CURRENT MEDICATIONS:  Listed on the chart, updated and reviewed.   MEDICATION ALLERGIES:  None known.   Social history and review of systems per the handwritten form.   PHYSICAL EXAM:  Obese white male in no acute distress.  Height 5 feet 8 inches, weight 245.2 pounds.  Blood pressure is 126/90,  pulse 86 and regular.  HEENT:  Anicteric sclerae.  Oropharynx clear.  CHEST:  Clear to  auscultation bilaterally.  CARDIAC:  Regular rate and rhythm without murmurs appreciated.  ABDOMEN:  Soft, nontender, nondistended.  Normoactive bowel sounds.  No  palpable organomegaly, masses or hernias.  EXTREMITIES:  Without clubbing, cyanosis, or edema.  NEUROLOGIC:  Alert and oriented x3.  Grossly nonfocal.   ASSESSMENT AND PLAN:  1. Suspected celiac disease based on genetic testing, family history      and symptoms.  We will obtain a tissue transglutaminase.  Proceed      with upper endoscopy with duodenal biopsies for further evaluation.      He is given information on celiac disease, including web site      resources and two books that are helpful for long-term dietary      measures.  I await biopsies for confirmation.  2. Gastroesophageal reflux disease.  Symptoms under fair control.      Continue Aciphex 20 mg p.o. q.a.m.  Reintensify all antireflux      measures.     [name]. Dustin Dar, MD, [facility]  Electronically Signed    MTS/MedQ  DD: 04/03/2007  DT: 04/03/2007  Job #: [id number]   cc:   Dustin Glaze, MD

## 2010-08-11 NOTE — Discharge Summary (Signed)
NAME:  [name], [name] NO.:  [id number]   MEDICAL RECORD NO.:  [id number]          PATIENT TYPE:  IPS   LOCATION:  0307                          FACILITY:  [facility]   PHYSICIAN:  Jasmine Pang, M.D. DATE OF BIRTH:  [birth year]   DATE OF ADMISSION:  07/06/2008  DATE OF DISCHARGE:  07/08/2008                               DISCHARGE SUMMARY   IDENTIFICATION:  This is a 50 year old single white male, who was  admitted on a voluntary basis on July 05, 2008.   HISTORY OF PRESENT ILLNESS:  The patient states he had a depression for  about 1 year after being treated with 6 months of interferon.  He states  he began to have thoughts of shooting himself and came to the ED.  For  further admission information, see psychiatric admission assessment.   PHYSICAL FINDINGS:  There were no acute physical or medical problems  noted.  Physical exam was done in the ED prior to admission.   DIAGNOSTIC STUDIES:  Alcohol level was less than 5.  Comprehensive  metabolic panel was grossly within normal limits except for slightly  elevated glucose of 102.  Urine drug screen was negative.   HOSPITAL COURSE:  Upon admission, the patient was started on Cymbalta 60  mg daily, Lipitor 10 mg daily, Lantus 20 units subcutaneous h.s., and  Ambien 5 mg p.o. q.h.s., may repeat x1 if needed.  He was also placed on  his Crestor 10 mg p.o. daily.  He was also restarted on his amlodipine 5  mg q.a.m. and Janumet 50/500 p.o. q.a.m.  He was also started on Abilify  2 mg p.o. b.i.d., Wellbutrin XL 300 mg daily, and Protonix 40 mg b.i.d.  Due to some nausea and vomiting, he was started on Zofran ODT 4 mg q.6  h. p.r.n. nausea and vomiting and Imodium for diarrhea.  In sessions,  the patient was alert, cooperative, and spontaneous.  His mood was  depressed.  Affect was consistent with mood.  He had positive suicidal  ideation.  Positive rumination about how to do it.  On July 07, 2008,  the patient felt less  depressed, less anxious.  On July 08, 2008, sleep  was good.  Appetite was good.  Mental status had improved from admission  status.  Mood was euthymic.  Affect was consistent with mood.  There was  no suicidal or homicidal ideation.  No thoughts of self-injurious  behavior.  No auditory or visual hallucinations.  No paranoia or  delusions.  Thoughts were logical, goal-directed.  Thought content, no  predominant theme.  Cognitive was grossly intact.  Insight good.  Judgment good.  Impulse control good.  The patient wanted to go home  today and was felt to be safe for discharge.   DISCHARGE DIAGNOSES:  Axis I:  Mood disorder not otherwise specified.  Axis II:  None.  Axis III:  Melanoma, diabetes mellitus type 2.  Axis IV:  Moderate (problems with primary support group, other  psychosocial problems, burden of medical problems).  Axis V:  Global assessment of functioning was 55 upon discharge.  Global  assessment of functioning was 46 upon admission.  Global assessment of  functioning highest past year was 69-75.   DISCHARGE PLAN:  There was no specific activity level or dietary  restrictions.   POSTHOSPITAL CARE PLANS:  The patient will return to his outpatient  psychiatrist, Dr. Evelene Croon on Aug 08, 2008, at 5:15 p.m.  He will also see  Maxcine Ham, his case manager here at [facility] on July 11, 2008, at 3 o'clock p.m. to make sure that he is having adequate  followup.   DISCHARGE MEDICATIONS:  1. Cymbalta 60 mg daily.  2. Wellbutrin XL 300 mg daily.  3. Continue Lantus as prescribed.  4. Crestor 10 mg daily.  5. Norvasc 5 mg daily.  6. Janumet 50/500 mg p.o. daily.  7. Abilify 2 mg twice a day.      Jasmine Pang, M.D.  Electronically Signed     BHS/MEDQ  D:  07/08/2008  T:  07/09/2008  Job:  [id number]

## 2010-08-14 NOTE — Op Note (Signed)
NAME:  [name], [name] NO.:  [id number]   MEDICAL RECORD NO.:  [id number]          PATIENT TYPE:  OUT   LOCATION:  NUC                          FACILITY:  [facility]   PHYSICIAN:  Kristine Garbe. Ezzard Standing, M.D.DATE OF BIRTH:  Sep 21, 1960   DATE OF PROCEDURE:  07/26/2006  DATE OF DISCHARGE:                               OPERATIVE REPORT   PREOPERATIVE DIAGNOSIS:  Right ear melanoma.   POSTOPERATIVE DIAGNOSIS:  Right ear melanoma.   OPERATION:  1. Wedge excision of right ear melanoma with primary closure in      interrupted layers.  2. Right seminal lymph node biopsy.   SURGEON:  Kristine Garbe. Ezzard Standing, M.D.   ASSISTANT:  Sandria Bales. Ezzard Standing, M.D.   ANESTHESIA:  General endotracheal.   COMPLICATIONS:  None.   BRIEF CLINICAL NOTE:  [name] is a 50 year old gentleman who had  had a recent biopsy of a right ear lesion which showed a superficial  spreading malignant melanoma with depth measuring 2.8 mm.  There was a  positive margin on the excisional biopsy.  The patient has seen Dr.  Arbutus Ped who recommended a wide excision and a sentinel lymph node  biopsy.  The patient is taken to the operating room at this time for  wedge excision of right ear melanoma and sentinel lymph node biopsy.   DESCRIPTION OF PROCEDURE:  The patient had previously received injection  of radioactive dye to the right ear.  The patient was taken to the  operating room and after undergoing general endotracheal anesthesia, the  previous excision site was also injected with blue dye.  Using the  [facility], measurements were made in the highest measurements of  about 3-400 were detected just behind the angle of the jaw where the  previous lymphoscintigram had lightened up and the same region of the  node had been noticed on the previous CT scan of the head.  This area  was marked.  The ear was then prepped with Betadine solution.  Incision  was made directly over our mark where the sentinel  node was felt to be  located.  Dissection was carried down through the subcutaneous tissue  fat.  The patient had some scar tissue from previous trauma as a child.  The lymph node was identified.  It had lightened up with the blue dye  and using the [facility], showed numbers of about 4-500.  The node  was removed with counts of 6700 with the [facility] counter after removing  the node, the counts of the bed were down to less than 10.  Node was  sent in saline as the several lymph node of the right ear melanoma.  Neck defect was closed with 3-0 chromic sutures subcutaneously and 5-0  nylon to reapproximate the skin edges.  Next, the area where the  previous melanoma had been excised, sutures were still intact.  I marked  1 cm margins on either side of the previous excision site which totaled  about 3 cm of the pinna was wedge excised as a previous excision site  measured about a centimeter and  borders were marked on either side.  A 3  cm wedge excision of the right ear was then performed and this was sent  as a specimen with a suture marking the superior margin.  Hemostasis was  obtained with a cautery and then the defect was closed in layers with 5-  0 Vicryl suture reapproximating the cartilage and then 6-0 and 5-0 nylon  to reapproximate the skin edges.  Bacitracin ointment and Glasscock ear  dressing was then applied to the ear.  The patient received 1 gram Ancef  intraoperatively.  This completed the procedure.  Davis was awoken from  anesthesia and transferred to the recovery room postop doing well.   DISPOSITION:  Dustin was discharged home later this morning on Keflex  500 mg b.i.d. for 5 days, Tylenol and Vicodin p.r.n. pain.  Will have  him follow up in my office in one week for recheck and to have sutures  removed.  He is instructed leave the Glasscock dressing on for 24 hours  and then he can remove the dressing and get the incisions wet.            ______________________________  Kristine Garbe. Ezzard Standing, M.D.     CEN/MEDQ  D:  07/26/2006  T:  07/26/2006  Job:  [id number]   cc:   Stacie Glaze, MD  Lajuana Matte, MD  Sandria Bales. Ezzard Standing, M.D.

## 2010-09-09 ENCOUNTER — Other Ambulatory Visit: Payer: Self-pay | Admitting: Internal Medicine

## 2010-09-09 ENCOUNTER — Other Ambulatory Visit (HOSPITAL_COMMUNITY): Payer: Self-pay

## 2010-09-09 ENCOUNTER — Encounter (HOSPITAL_COMMUNITY): Payer: Self-pay

## 2010-09-09 ENCOUNTER — Ambulatory Visit (HOSPITAL_COMMUNITY)
Admission: RE | Admit: 2010-09-09 | Discharge: 2010-09-09 | Disposition: A | Discharge status: Home / Self Care | Payer: BC Managed Care – PPO | Source: Ambulatory Visit | Attending: Internal Medicine | Admitting: Internal Medicine

## 2010-09-09 ENCOUNTER — Encounter (HOSPITAL_BASED_OUTPATIENT_CLINIC_OR_DEPARTMENT_OTHER): Payer: BC Managed Care – PPO | Admitting: Internal Medicine

## 2010-09-09 DIAGNOSIS — C434 Malignant melanoma of scalp and neck: Secondary | ICD-10-CM | POA: Insufficient documentation

## 2010-09-09 DIAGNOSIS — K7689 Other specified diseases of liver: Secondary | ICD-10-CM | POA: Insufficient documentation

## 2010-09-09 DIAGNOSIS — C4320 Malignant melanoma of unspecified ear and external auricular canal: Secondary | ICD-10-CM

## 2010-09-09 DIAGNOSIS — M629 Disorder of muscle, unspecified: Secondary | ICD-10-CM | POA: Insufficient documentation

## 2010-09-09 DIAGNOSIS — L988 Other specified disorders of the skin and subcutaneous tissue: Secondary | ICD-10-CM | POA: Insufficient documentation

## 2010-09-09 DIAGNOSIS — K118 Other diseases of salivary glands: Secondary | ICD-10-CM | POA: Insufficient documentation

## 2010-09-09 DIAGNOSIS — K110 Atrophy of salivary gland: Secondary | ICD-10-CM | POA: Insufficient documentation

## 2010-09-09 DIAGNOSIS — Z9089 Acquired absence of other organs: Secondary | ICD-10-CM | POA: Insufficient documentation

## 2010-09-09 DIAGNOSIS — C439 Malignant melanoma of skin, unspecified: Secondary | ICD-10-CM

## 2010-09-09 DIAGNOSIS — M2420 Disorder of ligament, unspecified site: Secondary | ICD-10-CM | POA: Insufficient documentation

## 2010-09-09 DIAGNOSIS — N289 Disorder of kidney and ureter, unspecified: Secondary | ICD-10-CM | POA: Insufficient documentation

## 2010-09-09 HISTORY — DX: Malignant (primary) neoplasm, unspecified: C80.1

## 2010-09-09 LAB — COMPREHENSIVE METABOLIC PANEL
AST: 26 U/L (ref 0–37)
Albumin: 4.4 g/dL (ref 3.5–5.2)
Alkaline Phosphatase: 81 U/L (ref 39–117)
BUN: 15 mg/dL (ref 6–23)
Creatinine, Ser: 1.03 mg/dL (ref 0.50–1.35)
Glucose, Bld: 272 mg/dL — ABNORMAL HIGH (ref 70–99)
Potassium: 4.8 mEq/L (ref 3.5–5.3)
Total Bilirubin: 0.4 mg/dL (ref 0.3–1.2)

## 2010-09-09 LAB — CBC WITH DIFFERENTIAL/PLATELET
Basophils Absolute: 0 10*3/uL (ref 0.0–0.1)
EOS%: 2.4 % (ref 0.0–7.0)
Eosinophils Absolute: 0.1 10*3/uL (ref 0.0–0.5)
HCT: 40.8 % (ref 38.4–49.9)
HGB: 14.1 g/dL (ref 13.0–17.1)
LYMPH%: 32.8 % (ref 14.0–49.0)
MCH: 28.4 pg (ref 27.2–33.4)
MCV: 82.3 fL (ref 79.3–98.0)
MONO%: 7.7 % (ref 0.0–14.0)
NEUT#: 3.5 10*3/uL (ref 1.5–6.5)
NEUT%: 56.8 % (ref 39.0–75.0)
Platelets: 207 10*3/uL (ref 140–400)
RDW: 13.1 % (ref 11.0–14.6)

## 2010-09-09 MED ORDER — IOHEXOL 300 MG/ML  SOLN
125.0000 mL | Freq: Once | INTRAMUSCULAR | Status: AC | PRN
  Administered 2010-09-09: 125 mL via INTRAVENOUS

## 2010-09-16 ENCOUNTER — Encounter (HOSPITAL_BASED_OUTPATIENT_CLINIC_OR_DEPARTMENT_OTHER): Payer: BC Managed Care – PPO | Admitting: Internal Medicine

## 2010-09-16 DIAGNOSIS — C4320 Malignant melanoma of unspecified ear and external auricular canal: Secondary | ICD-10-CM

## 2010-10-01 ENCOUNTER — Other Ambulatory Visit: Payer: Self-pay | Admitting: Internal Medicine

## 2010-10-19 ENCOUNTER — Encounter: Payer: Self-pay | Admitting: Internal Medicine

## 2010-10-19 ENCOUNTER — Ambulatory Visit (INDEPENDENT_AMBULATORY_CARE_PROVIDER_SITE_OTHER): Payer: BC Managed Care – PPO | Admitting: Internal Medicine

## 2010-10-19 VITALS — BP 140/90 | HR 80 | Temp 98.2°F | Resp 16 | Ht 68.5 in | Wt 276.0 lb

## 2010-10-19 DIAGNOSIS — F329 Major depressive disorder, single episode, unspecified: Secondary | ICD-10-CM

## 2010-10-19 DIAGNOSIS — F3289 Other specified depressive episodes: Secondary | ICD-10-CM

## 2010-10-19 DIAGNOSIS — E785 Hyperlipidemia, unspecified: Secondary | ICD-10-CM

## 2010-10-19 DIAGNOSIS — E119 Type 2 diabetes mellitus without complications: Secondary | ICD-10-CM

## 2010-10-19 MED ORDER — INSULIN NPH ISOPHANE & REGULAR (70-30) 100 UNIT/ML ~~LOC~~ SUSP: SUBCUTANEOUS | Status: DC

## 2010-10-19 NOTE — Progress Notes (Signed)
  Subjective:    Patient ID: [name], male    DOB: 1961-01-08, 50 y.o.   MRN: [id number]  HPI Patient is a 50 year old white male who presents for followup of diabetes hyperlipidemia hypertension and depression.  His depression has been controlled with the use of Pristiq. His diabetes is poorly controlled due in part to his obesity dietary noncompliance and medications.  We discussed today beginning twice daily 7030 insulin injections to better control his diabetes and his transition from oral to insulin requiring diabetic  Review of Systems  Constitutional: Negative for fever and fatigue.       Weight gain morbidly obese  HENT: Negative for hearing loss, congestion, neck pain and postnasal drip.   Eyes: Negative for discharge, redness and visual disturbance.  Respiratory: Negative for cough, shortness of breath and wheezing.   Cardiovascular: Negative for leg swelling.  Gastrointestinal: Negative for abdominal pain, constipation and abdominal distention.  Genitourinary: Negative for urgency and frequency.  Musculoskeletal: Negative for joint swelling and arthralgias.  Skin: Negative for color change and rash.  Neurological: Negative for weakness and light-headedness.  Hematological: Negative for adenopathy.  Psychiatric/Behavioral: Negative for behavioral problems.   Past Medical History  Diagnosis Date  . GERD (gastroesophageal reflux disease)   . Hyperlipidemia   . Metabolic syndrome   . Diabetes mellitus   . Cancer     melanoma   Past Surgical History  Procedure Date  . Melanoma excision   . Tonsillectomy     age 56  . Reconstructive plastic surgery face     dog bite at age 21    reports that he quit smoking about 22 years ago. He does not have any smokeless tobacco history on file. He reports that he does not drink alcohol or use illicit drugs. family history includes Arthritis in his mother and Stroke in his father. No Known Allergies     Objective:   Physical Exam  Constitutional: He appears well-developed and well-nourished.       Morbidly obese  HENT:  Head: Normocephalic and atraumatic.  Eyes: Conjunctivae are normal. Pupils are equal, round, and reactive to light.  Neck: Normal range of motion. Neck supple.  Cardiovascular: Normal rate and regular rhythm.   Pulmonary/Chest: Effort normal and breath sounds normal.  Abdominal: Soft. Bowel sounds are normal.          Assessment & Plan:  Change the levemir to NovoLog 70 3025 units subcutaneous before breakfast and dinner teach use of insulin syringes discuss hypoglycemic protocols monitor use in one month with return office visit.  I have spent more than 30 minutes examining this patient face-to-face of which over half was spent in counseling

## 2010-11-03 IMAGING — CT CT CHEST W/ CM
2 of 8 series · 13 of 46 positions shown, 18 images · IV contrast (omnipaque)
Comparison: CT 03/13/2009

CT NECK

CLINICAL DATA: Melanoma with prior immunotherapy in August 2007.
Patient history of right neck dissection.

CT NECK, CHEST, ABDOMEN AND PELVIS WITH CONTRAST
TECHNIQUE: Multidetector CT imaging of the neck, chest, abdomen
and pelvis was performed using the standard protocol following the
bolus administration of intravenous contrast.
Contrast: 125 ml Omnipaque 300

[Series 2: cap st · axial · 0.85mm/px · z∈[+1093,+1643]mm · 10 of 132 slices shown, 15 images]
[im 11/132  soft-tissue]
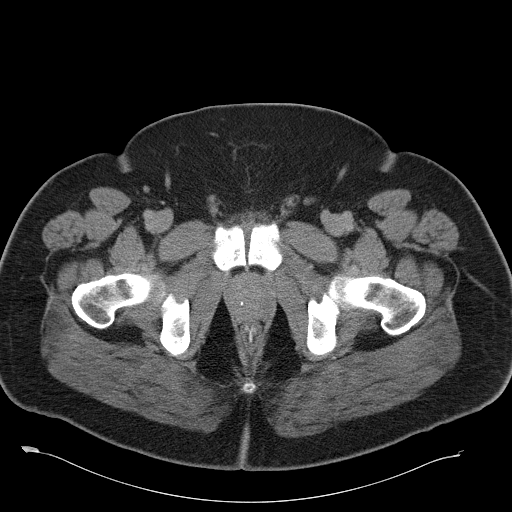
[im 11/132  bone]
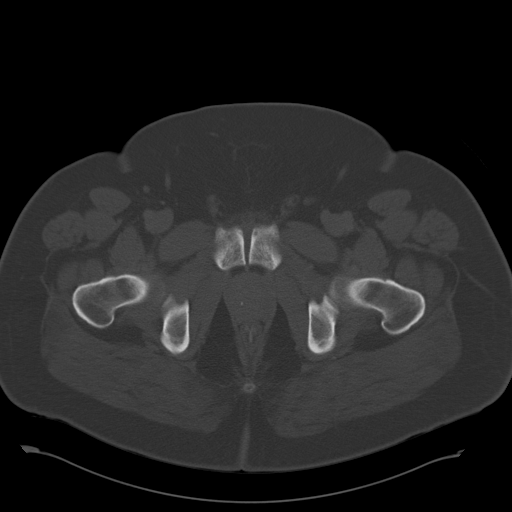
[im 22/132  soft-tissue]
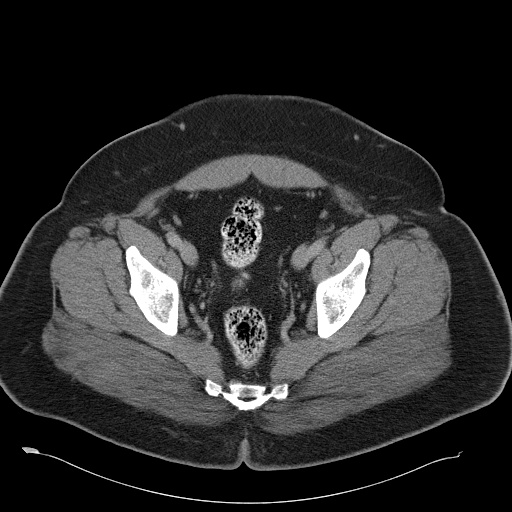
[im 44/132  soft-tissue]
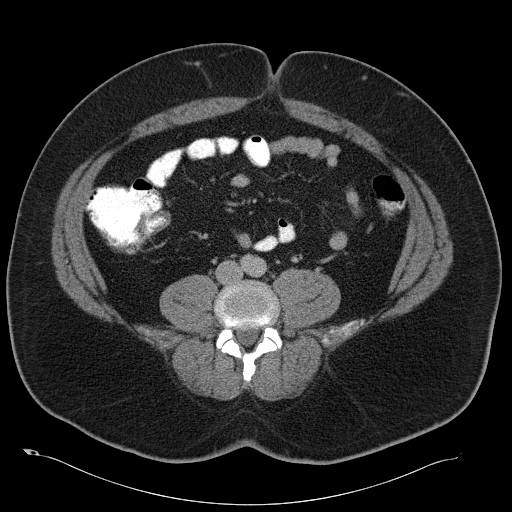
[im 55/132  soft-tissue]
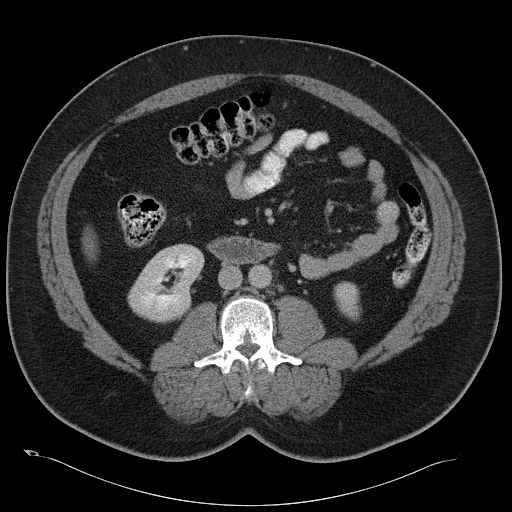
[im 66/132  soft-tissue]
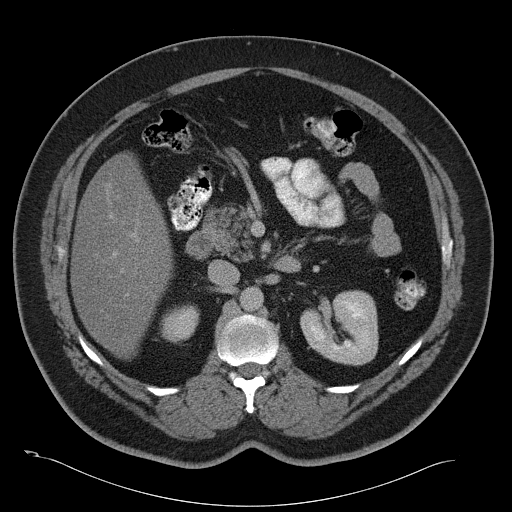
[im 77/132  soft-tissue]
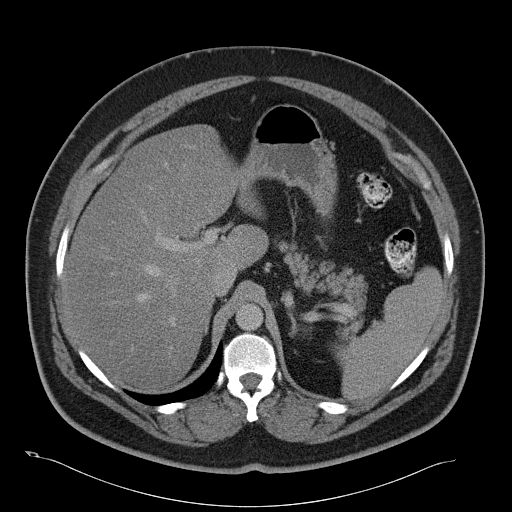
[im 88/132  soft-tissue]
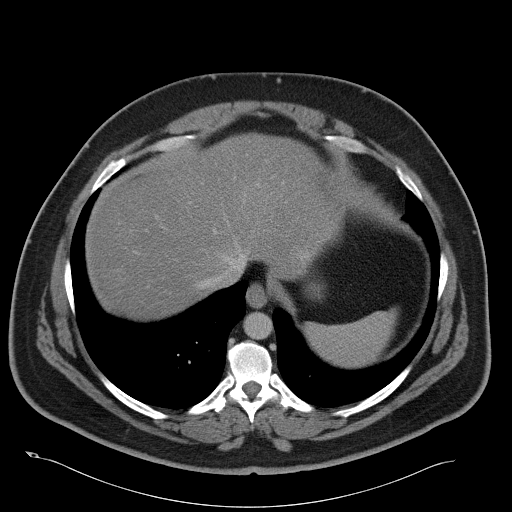
[im 88/132  lung]
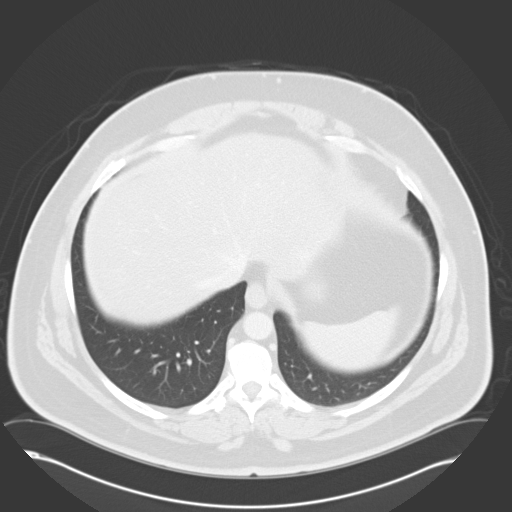
[im 99/132  lung]
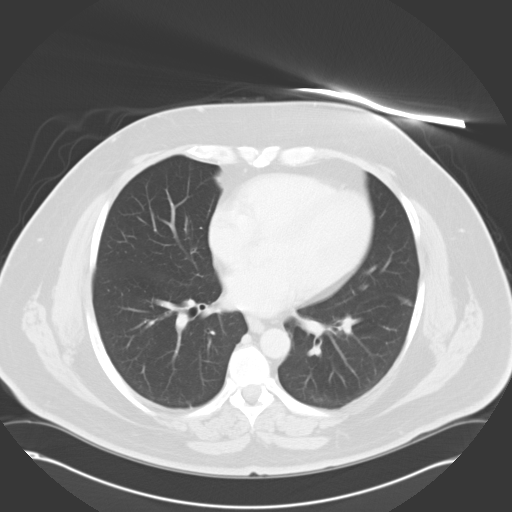
[im 110/132  soft-tissue]
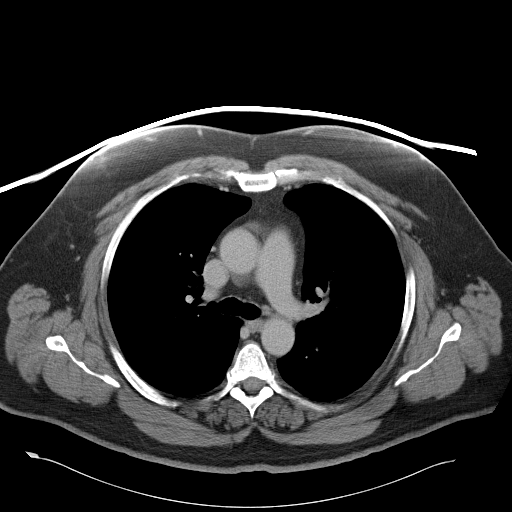
[im 110/132  lung]
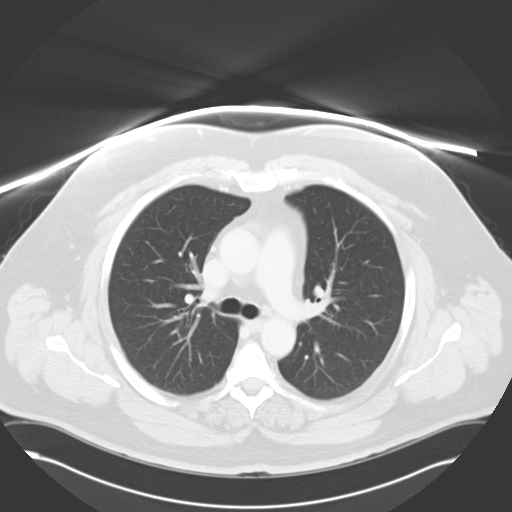
[im 121/132  soft-tissue]
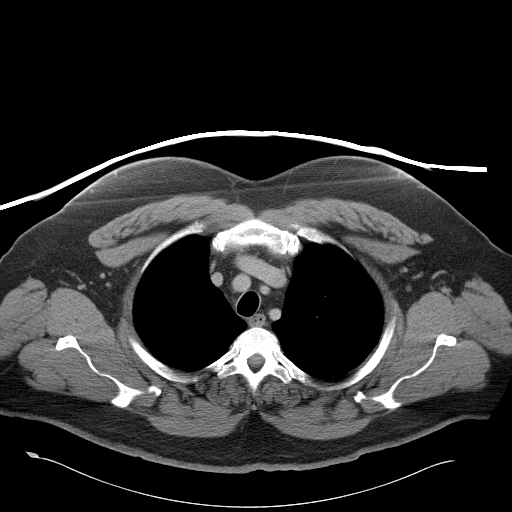
[im 121/132  lung]
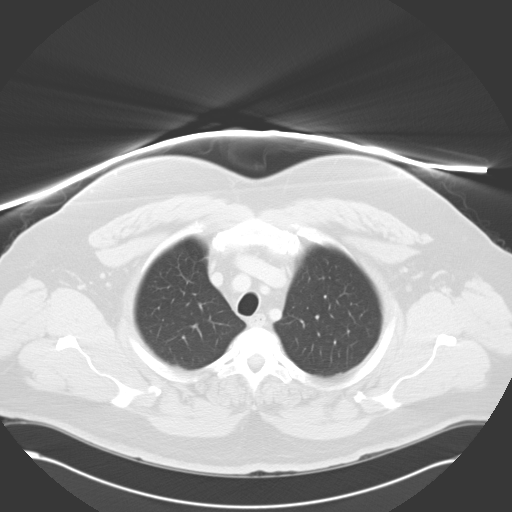
[im 121/132  bone]
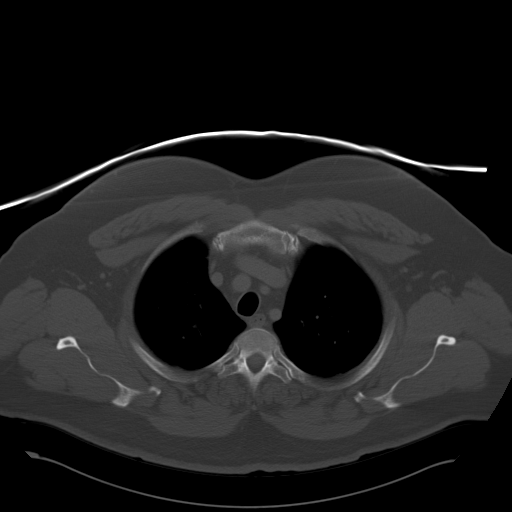

[Series 607: <mpr thick range(5)> · coronal · 0.52mm/px · 3 of 112 slices shown]
[im 28/112  soft-tissue]
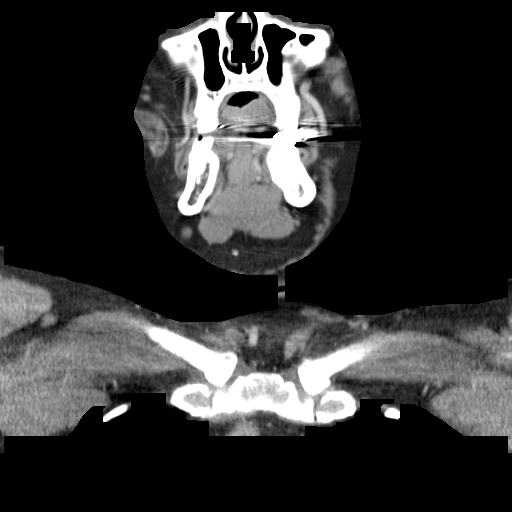
[im 56/112  soft-tissue]
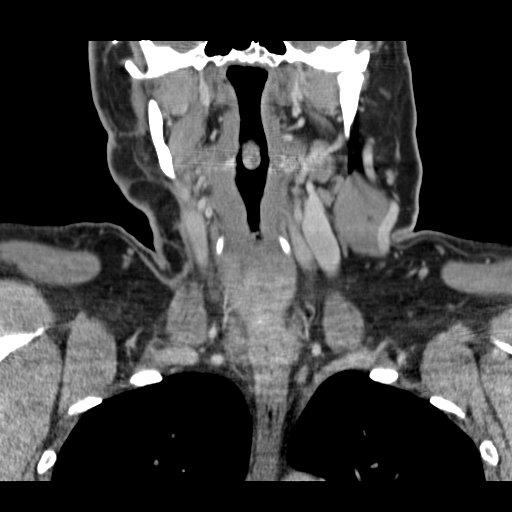
[im 84/112  soft-tissue]
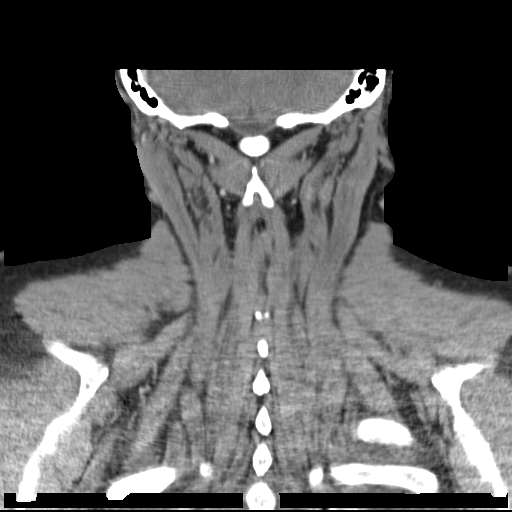

[13 of 46 positions shown; findings below may reference images not displayed]

FINDINGS: There are small bilateral level I lymph nodes which are
not changed in size compared to prior.  For example left level one
node measuring 10 mm short axis (image 13) is not changed from 9 mm
short axis on prior.  Adjacent 5-6 mm level I lymph nodes on the
right (image 13) are unchanged in size.  No evidence of new lymph
nodes.

There is postsurgical change in the right neck with granulation
tissue, skin thickening, and volume loss.  The sternocleidomastoid
muscle is absent.  No evidence of new nodularity along the
resection site.  Review of the skeleton demonstrates no aggressive
osseous lesions.
IMPRESSION: 1.  Postsurgical change in the right neck is stable.
2.  No evidence of lymphadenopathy.
3.  Small bilateral lymph nodes as described.

CT CHEST
FINDINGS: No evidence of axillary or supraclavicular
lymphadenopathy.  No chest wall subcutaneous abnormality.  No
evidence of mediastinal or hilar lymphadenopathy.  No suspicious
pulmonary nodules.
IMPRESSION: No evidence of thoracic metastasis.

CT ABDOMEN AND PELVIS
FINDINGS: No focal hepatic lesion.  The gallbladder, pancreas,
spleen, adrenal glands, and kidneys are normal.  There are small
hypodensities in the kidneys most consistent cysts.

The stomach, duodenum, small bowel, cecum are normal.  The colon
and rectosigmoid colon normal.

Abdominal aorta is normal caliber.  There is sub centimeter
periaortic lymph nodes which are not significantly changed from
prior.  No periportal adenopathy.

No free fluid the pelvis.  The bladder prostate gland are normal.
No evidence of pelvic lymphadenopathy. Review of  bone windows
demonstrates no aggressive osseous lesions.
IMPRESSION: 1.  No evidence of metastasis within the abdomen pelvis.
2.  Stable small periaortic lymph nodes.

## 2010-11-27 ENCOUNTER — Ambulatory Visit (INDEPENDENT_AMBULATORY_CARE_PROVIDER_SITE_OTHER): Payer: BC Managed Care – PPO | Admitting: Internal Medicine

## 2010-11-27 VITALS — BP 154/90 | HR 76 | Temp 98.2°F | Resp 16 | Ht 68.0 in | Wt 276.0 lb

## 2010-11-27 DIAGNOSIS — E1169 Type 2 diabetes mellitus with other specified complication: Secondary | ICD-10-CM

## 2010-11-27 DIAGNOSIS — I10 Essential (primary) hypertension: Secondary | ICD-10-CM

## 2010-11-27 DIAGNOSIS — E1165 Type 2 diabetes mellitus with hyperglycemia: Secondary | ICD-10-CM

## 2010-11-27 LAB — BASIC METABOLIC PANEL
CO2: 27 mEq/L (ref 19–32)
Calcium: 9.3 mg/dL (ref 8.4–10.5)
Creat: 1.1 mg/dL (ref 0.50–1.35)
Sodium: 137 mEq/L (ref 135–145)

## 2010-11-27 NOTE — Progress Notes (Signed)
  Subjective:    Patient ID: AVYUKT CIMO, male    DOB: 03/07/1961, 50 y.o.   MRN: [id number]  HPI Patient has insulin requiring diabetes who recently began twice a day 7030 insulin with excellent results in controlling his hyperglycemia.   Pt has noted CBG's in the 130 range after starting the novolog  70/30 25 BID He denies any hypoglycemia episodes.  Generally he has felt well since institution of the insulin.  He has gained some weight and his blood pressure is elevated at 154/90  Review of Systems  Constitutional: Negative for fever and fatigue.  HENT: Negative for hearing loss, congestion, neck pain and postnasal drip.   Eyes: Negative for discharge, redness and visual disturbance.  Respiratory: Negative for cough, shortness of breath and wheezing.   Cardiovascular: Negative for leg swelling.  Gastrointestinal: Negative for abdominal pain, constipation and abdominal distention.  Genitourinary: Negative for urgency and frequency.  Musculoskeletal: Negative for joint swelling and arthralgias.  Skin: Negative for color change and rash.  Neurological: Negative for weakness and light-headedness.  Hematological: Negative for adenopathy.  Psychiatric/Behavioral: Negative for behavioral problems.   Past Medical History  Diagnosis Date  . GERD (gastroesophageal reflux disease)   . Hyperlipidemia   . Metabolic syndrome   . Diabetes mellitus   . Cancer     melanoma   Past Surgical History  Procedure Date  . Melanoma excision   . Tonsillectomy     age 26  . Reconstructive plastic surgery face     dog bite at age 57    reports that he quit smoking about 22 years ago. He does not have any smokeless tobacco history on file. He reports that he does not drink alcohol or use illicit drugs. family history includes Arthritis in his mother and Stroke in his father. No Known Allergies      Objective:   Physical Exam  Constitutional: He appears well-developed and well-nourished.    HENT:  Head: Normocephalic and atraumatic.  Eyes: Conjunctivae are normal. Pupils are equal, round, and reactive to light.  Neck: Normal range of motion. Neck supple.  Cardiovascular: Normal rate and regular rhythm.   Pulmonary/Chest: Effort normal and breath sounds normal.  Abdominal: Soft. Bowel sounds are normal.          Assessment & Plan:  Insulin requiring diabetes with improvement with twice daily 7030 insulin.  We'll obtain an A1c to be done today I doubt that the A1c will respond as much as expected since we bumped only been on this medication for 30 days however we should see a trending down of the A1c as well as improvement of the basic metabolic panel now as patient has to address his weight as his blood pressure is elevated with this and we will not adjust his blood pressure medicines this point we given him a two-month trial for salt restriction and weight reduction.

## 2010-11-28 LAB — HEMOGLOBIN A1C
Hgb A1c MFr Bld: 9.1 % — ABNORMAL HIGH (ref <5.7)
Mean Plasma Glucose: 214 mg/dL — ABNORMAL HIGH (ref <117)

## 2010-11-29 ENCOUNTER — Encounter: Payer: Self-pay | Admitting: Internal Medicine

## 2011-02-02 ENCOUNTER — Other Ambulatory Visit: Payer: Self-pay | Admitting: Internal Medicine

## 2011-02-04 ENCOUNTER — Ambulatory Visit (INDEPENDENT_AMBULATORY_CARE_PROVIDER_SITE_OTHER): Payer: BC Managed Care – PPO | Admitting: Internal Medicine

## 2011-02-04 VITALS — BP 140/80 | HR 76 | Temp 98.2°F | Resp 16 | Ht 68.5 in | Wt 288.0 lb

## 2011-02-04 DIAGNOSIS — E785 Hyperlipidemia, unspecified: Secondary | ICD-10-CM

## 2011-02-04 DIAGNOSIS — I10 Essential (primary) hypertension: Secondary | ICD-10-CM

## 2011-02-04 DIAGNOSIS — T887XXA Unspecified adverse effect of drug or medicament, initial encounter: Secondary | ICD-10-CM

## 2011-02-04 DIAGNOSIS — Z23 Encounter for immunization: Secondary | ICD-10-CM

## 2011-02-04 DIAGNOSIS — E119 Type 2 diabetes mellitus without complications: Secondary | ICD-10-CM

## 2011-02-04 LAB — BASIC METABOLIC PANEL
BUN: 9 mg/dL (ref 6–23)
CO2: 30 mEq/L (ref 19–32)
Calcium: 9.2 mg/dL (ref 8.4–10.5)
Chloride: 102 mEq/L (ref 96–112)
Creatinine, Ser: 1 mg/dL (ref 0.4–1.5)
Glucose, Bld: 271 mg/dL — ABNORMAL HIGH (ref 70–99)

## 2011-02-04 LAB — HEPATIC FUNCTION PANEL
ALT: 42 U/L (ref 0–53)
Albumin: 3.8 g/dL (ref 3.5–5.2)
Total Protein: 6.8 g/dL (ref 6.0–8.3)

## 2011-02-04 LAB — LIPID PANEL
Cholesterol: 106 mg/dL (ref 0–200)
HDL: 39 mg/dL — ABNORMAL LOW (ref >39.00)
Triglycerides: 158 mg/dL — ABNORMAL HIGH (ref 0.0–149.0)
VLDL: 31.6 mg/dL (ref 0.0–40.0)

## 2011-02-04 MED ORDER — INSULIN NPH ISOPHANE & REGULAR (70-30) 100 UNIT/ML ~~LOC~~ SUSP: SUBCUTANEOUS | Status: DC

## 2011-02-04 NOTE — Progress Notes (Signed)
  Subjective:    Patient ID: [name], male    DOB: [birth year], 50 y.o.   MRN: [id number]  HPI Increased weight due to stress and not following diet CBG in the 170 range with this diet Weight increased Ran out of insulin   Review of Systems  Constitutional: Negative for fever and fatigue.  HENT: Negative for hearing loss, congestion, neck pain and postnasal drip.   Eyes: Negative for discharge, redness and visual disturbance.  Respiratory: Negative for cough, shortness of breath and wheezing.   Cardiovascular: Negative for leg swelling.  Gastrointestinal: Negative for abdominal pain, constipation and abdominal distention.  Genitourinary: Negative for urgency and frequency.  Musculoskeletal: Negative for joint swelling and arthralgias.  Skin: Negative for color change and rash.  Neurological: Negative for weakness and light-headedness.  Hematological: Negative for adenopathy.  Psychiatric/Behavioral: Negative for behavioral problems.   Past Medical History  Diagnosis Date  . GERD (gastroesophageal reflux disease)   . Hyperlipidemia   . Metabolic syndrome   . Diabetes mellitus   . Cancer     melanoma   Past Surgical History  Procedure Date  . Melanoma excision   . Tonsillectomy     age 60  . Reconstructive plastic surgery face     dog bite at age 48    reports that he quit smoking about 22 years ago. He does not have any smokeless tobacco history on file. He reports that he does not drink alcohol or use illicit drugs. family history includes Arthritis in his mother and Stroke in his father. No Known Allergies     Objective:   Physical Exam  Nursing note and vitals reviewed. Constitutional: He appears well-developed and well-nourished.       Weight gain  HENT:  Head: Normocephalic and atraumatic.  Eyes: Conjunctivae are normal. Pupils are equal, round, and reactive to light.  Neck: Normal range of motion. Neck supple.  Cardiovascular: Normal rate and regular  rhythm.   Pulmonary/Chest: Effort normal and breath sounds normal.  Abdominal: Soft. Bowel sounds are normal.          Assessment & Plan:  Poor control over DM due to compliance issues and increased stress that has resulted in overeating Mood stable. Reflux stable Staying of crestor for lipids

## 2011-02-04 NOTE — Patient Instructions (Signed)
The patient is instructed to continue all medications as prescribed. Schedule followup with check out clerk upon leaving the clinic  

## 2011-03-19 ENCOUNTER — Other Ambulatory Visit: Payer: Self-pay | Admitting: Internal Medicine

## 2011-03-24 ENCOUNTER — Other Ambulatory Visit: Payer: Self-pay | Admitting: Internal Medicine

## 2011-03-24 DIAGNOSIS — C439 Malignant melanoma of skin, unspecified: Secondary | ICD-10-CM

## 2011-03-28 ENCOUNTER — Telehealth: Payer: Self-pay | Admitting: Internal Medicine

## 2011-03-28 NOTE — Telephone Encounter (Signed)
Mailed the pt his June 2013 appt calendar along with the ct scan. Pt is aware to stop by to pick up the oral contrast

## 2011-05-07 ENCOUNTER — Ambulatory Visit: Payer: BC Managed Care – PPO | Admitting: Internal Medicine

## 2011-06-03 ENCOUNTER — Ambulatory Visit (INDEPENDENT_AMBULATORY_CARE_PROVIDER_SITE_OTHER): Payer: BC Managed Care – PPO | Admitting: Internal Medicine

## 2011-06-03 ENCOUNTER — Encounter: Payer: Self-pay | Admitting: Internal Medicine

## 2011-06-03 VITALS — BP 138/90 | HR 80 | Temp 98.3°F | Resp 16 | Ht 68.5 in | Wt 280.0 lb

## 2011-06-03 DIAGNOSIS — E669 Obesity, unspecified: Secondary | ICD-10-CM

## 2011-06-03 DIAGNOSIS — K219 Gastro-esophageal reflux disease without esophagitis: Secondary | ICD-10-CM

## 2011-06-03 DIAGNOSIS — E1169 Type 2 diabetes mellitus with other specified complication: Secondary | ICD-10-CM

## 2011-06-03 NOTE — Progress Notes (Signed)
Subjective:    Patient ID: [name], male    DOB: [birth year], 51 y.o.   MRN: [id number]  HPI Patient is a 51 year old white male who is followed for hypertension hyperlipidemia and diabetes.  He was concerned that he might have gained weight but when we reviewed his flow sheet he is down 8 pounds from his November office visit.  His blood pressure is stable.  However his CBGs at home have been running in the 140 range which would be an A1c of 7 or greater   Review of Systems  Constitutional: Positive for activity change, appetite change and unexpected weight change. Negative for fever and fatigue.  HENT: Negative for hearing loss, congestion, neck pain and postnasal drip.   Eyes: Negative for discharge, redness and visual disturbance.  Respiratory: Negative for cough, shortness of breath and wheezing.   Cardiovascular: Negative for leg swelling.  Gastrointestinal: Negative for abdominal pain, constipation and abdominal distention.  Genitourinary: Negative for urgency and frequency.  Musculoskeletal: Negative for joint swelling and arthralgias.  Skin: Negative for color change and rash.  Neurological: Negative for weakness and light-headedness.  Hematological: Negative for adenopathy.  Psychiatric/Behavioral: Negative for behavioral problems.   Past Medical History  Diagnosis Date  . GERD (gastroesophageal reflux disease)   . Hyperlipidemia   . Metabolic syndrome   . Diabetes mellitus   . Cancer     melanoma    History   Social History  . Marital Status: Single    Spouse Name: N/A    Number of Children: N/A  . Years of Education: N/A   Occupational History  . Not on file.   Social History Main Topics  . Smoking status: Former Smoker    Quit date: 05/19/1988  . Smokeless tobacco: Not on file   Comment: quit in 1990  . Alcohol Use: No  . Drug Use: No  . Sexually Active: Yes   Other Topics Concern  . Not on file   Social History Narrative  . No  narrative on file    Past Surgical History  Procedure Date  . Melanoma excision   . Tonsillectomy     age 89  . Reconstructive plastic surgery face     dog bite at age 38    Family History  Problem Relation Age of Onset  . Arthritis Mother   . Stroke Father     No Known Allergies  Current Outpatient Prescriptions on File Prior to Visit  Medication Sig Dispense Refill  . amLODipine (NORVASC) 5 MG tablet TAKE 1 TABLET DAILY  90 tablet  2  . ARIPiprazole (ABILIFY) 5 MG tablet Take 5 mg by mouth daily.        Marland Kitchen buPROPion (WELLBUTRIN XL) 150 MG 24 hr tablet Take 3 tablets by mouth once daily       . CRESTOR 10 MG tablet TAKE 1 TABLET DAILY  90 tablet  2  . desvenlafaxine (PRISTIQ) 50 MG 24 hr tablet Take 50 mg by mouth daily.        Marland Kitchen glucose blood (FREESTYLE LITE) test strip Use as instructed three times daily       . insulin NPH-insulin regular (NOVOLIN 70/30) (70-30) 100 UNIT/ML injection 25u SQ before breakfast and dinner if you skip a meal do not take the insulin  20 mL  3  . KOMBIGLYZE XR 07-998 MG TB24 TAKE 1 TABLET DAILY  90 tablet  2  . pantoprazole (PROTONIX) 40 MG tablet TAKE 1 TABLET DAILY  90 tablet  1    BP 138/90  Pulse 80  Temp 98.3 F (36.8 C)  Resp 16  Ht 5' 8.5" (1.74 m)  Wt 280 lb (127.007 kg)  BMI 41.95 kg/m2       Objective:   Physical Exam  Constitutional: He appears well-developed and well-nourished.       Obese  HENT:  Head: Normocephalic and atraumatic.  Eyes: Conjunctivae are normal. Pupils are equal, round, and reactive to light.  Neck: Normal range of motion. Neck supple.  Cardiovascular: Normal rate and regular rhythm.   Pulmonary/Chest: Effort normal and breath sounds normal.  Abdominal: Soft. Bowel sounds are normal.  Neurological: He is alert.  Skin: Skin is warm and dry.  Psychiatric: He has a normal mood and affect. His behavior is normal.          Assessment & Plan:  Patient's diabetes improved control 2 to dietary  indiscretion and weight gain he admits that he has had a "sweet tooth" and has not been following a diet this is complicated by his lack of exercise and the resultant weight gain.  We talked about the possibility of a referral to diabetic teaching but at this time he wishes to review literature that we gave him and see if he can control his weight and appetite.  Part of the problem with the appetite is the Abilify and its ability to increase appetite as well as complicate his diabetic control. It has been considered a necessary part of his antidepressant therapy but awake and continues we will have to look for any alternative.  GERD is stable.  Hypertension is stable.  Depression is controlled.

## 2011-06-03 NOTE — Patient Instructions (Signed)
The patient is instructed to continue all medications as prescribed. Schedule followup with check out clerk upon leaving the clinic  

## 2011-06-29 ENCOUNTER — Encounter: Payer: Self-pay | Admitting: Internal Medicine

## 2011-07-27 ENCOUNTER — Other Ambulatory Visit: Payer: Self-pay | Admitting: Internal Medicine

## 2011-09-03 ENCOUNTER — Ambulatory Visit: Payer: BC Managed Care – PPO | Admitting: Internal Medicine

## 2011-09-10 ENCOUNTER — Other Ambulatory Visit (HOSPITAL_BASED_OUTPATIENT_CLINIC_OR_DEPARTMENT_OTHER): Payer: BC Managed Care – PPO | Admitting: Lab

## 2011-09-10 ENCOUNTER — Ambulatory Visit (HOSPITAL_COMMUNITY)
Admission: RE | Admit: 2011-09-10 | Discharge: 2011-09-10 | Disposition: A | Discharge status: Home / Self Care | Payer: BC Managed Care – PPO | Source: Ambulatory Visit | Attending: Internal Medicine | Admitting: Internal Medicine

## 2011-09-10 ENCOUNTER — Encounter (HOSPITAL_COMMUNITY): Payer: Self-pay

## 2011-09-10 DIAGNOSIS — Z85820 Personal history of malignant melanoma of skin: Secondary | ICD-10-CM | POA: Insufficient documentation

## 2011-09-10 DIAGNOSIS — C4320 Malignant melanoma of unspecified ear and external auricular canal: Secondary | ICD-10-CM

## 2011-09-10 DIAGNOSIS — C439 Malignant melanoma of skin, unspecified: Secondary | ICD-10-CM

## 2011-09-10 DIAGNOSIS — K7689 Other specified diseases of liver: Secondary | ICD-10-CM | POA: Insufficient documentation

## 2011-09-10 LAB — CBC WITH DIFFERENTIAL/PLATELET
BASO%: 0.4 % (ref 0.0–2.0)
Basophils Absolute: 0 10*3/uL (ref 0.0–0.1)
EOS%: 3.6 % (ref 0.0–7.0)
HCT: 43.8 % (ref 38.4–49.9)
LYMPH%: 32.8 % (ref 14.0–49.0)
MCH: 27.8 pg (ref 27.2–33.4)
MCHC: 33.3 g/dL (ref 32.0–36.0)
MONO#: 0.5 10*3/uL (ref 0.1–0.9)
NEUT%: 56.8 % (ref 39.0–75.0)
Platelets: 228 10*3/uL (ref 140–400)

## 2011-09-10 LAB — CMP (CANCER CENTER ONLY)
ALT(SGPT): 56 U/L — ABNORMAL HIGH (ref 10–47)
AST: 35 U/L (ref 11–38)
BUN, Bld: 11 mg/dL (ref 7–22)
Creat: 1.2 mg/dl (ref 0.6–1.2)
Total Bilirubin: 0.6 mg/dl (ref 0.20–1.60)

## 2011-09-10 LAB — LACTATE DEHYDROGENASE: LDH: 151 U/L (ref 94–250)

## 2011-09-10 MED ORDER — IOHEXOL 300 MG/ML  SOLN
125.0000 mL | Freq: Once | INTRAMUSCULAR | Status: AC | PRN
  Administered 2011-09-10: 125 mL via INTRAVENOUS

## 2011-09-15 ENCOUNTER — Ambulatory Visit (HOSPITAL_BASED_OUTPATIENT_CLINIC_OR_DEPARTMENT_OTHER): Payer: BC Managed Care – PPO | Admitting: Internal Medicine

## 2011-09-15 ENCOUNTER — Telehealth: Payer: Self-pay | Admitting: Internal Medicine

## 2011-09-15 VITALS — BP 142/96 | HR 89 | Temp 97.5°F | Ht 68.5 in | Wt 287.0 lb

## 2011-09-15 DIAGNOSIS — C4320 Malignant melanoma of unspecified ear and external auricular canal: Secondary | ICD-10-CM

## 2011-09-15 DIAGNOSIS — E119 Type 2 diabetes mellitus without complications: Secondary | ICD-10-CM

## 2011-09-15 NOTE — Progress Notes (Signed)
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  PRIOR THERAPY:   1. Status post excision of lesion from the right ear on July 26, 2006. 2. Status post radical neck dissection under the care of Dr. Ezzard Standing. 3. Status post 4 weeks of treatment with adjuvant immunotherapy with intravenous interferon alpha IIB in the October 17, 2006. 4. Status post adjuvant treatment with subcutaneous interferon between November 07, 2006 through August 28, 2007, discontinued at the patient's request.  CURRENT THERAPY:  Observation.  INTERVAL HISTORY: [name] 51 y.o. male returns to the clinic today for annual followup visit. The patient is doing fine today with no specific complaints. He denied having any significant chest pain or shortness of breath, no cough or hemoptysis. He gained several pounds since his last visit. The patient has no suspicious skin lesions and no palpable lymphadenopathy. He has repeat CT scan of the neck, chest, abdomen and pelvis performed recently and he is here for evaluation and discussion of his scan results.  MEDICAL HISTORY: Past Medical History  Diagnosis Date  . GERD (gastroesophageal reflux disease)   . Hyperlipidemia   . Metabolic syndrome   . Diabetes mellitus   . Cancer     melanoma    ALLERGIES:  is allergic to codeine.  MEDICATIONS:  Current Outpatient Prescriptions  Medication Sig Dispense Refill  . amLODipine (NORVASC) 5 MG tablet TAKE 1 TABLET DAILY  90 tablet  1  . ARIPiprazole (ABILIFY) 5 MG tablet Take 5 mg by mouth daily.        Marland Kitchen buPROPion (WELLBUTRIN XL) 150 MG 24 hr tablet Take 3 tablets by mouth once daily       . CRESTOR 10 MG tablet TAKE 1 TABLET DAILY  90 tablet  2  . desvenlafaxine (PRISTIQ) 50 MG 24 hr tablet Take 50 mg by  mouth daily.        Marland Kitchen glucose blood (FREESTYLE LITE) test strip Use as instructed three times daily       . insulin NPH-insulin regular (NOVOLIN 70/30) (70-30) 100 UNIT/ML injection 25u SQ before breakfast and dinner if you skip a meal do not take the insulin  20 mL  3  . KOMBIGLYZE XR 07-998 MG TB24 TAKE 1 TABLET DAILY  90 tablet  2  . pantoprazole (PROTONIX) 40 MG tablet TAKE 1 TABLET DAILY  90 tablet  0    SURGICAL HISTORY:  Past Surgical History  Procedure Date  . Melanoma excision   . Tonsillectomy     age 41  . Reconstructive plastic surgery face     dog bite at age 48    REVIEW OF SYSTEMS:  A comprehensive review of systems was negative.   PHYSICAL EXAMINATION: General appearance: alert, cooperative and no distress Head: Normocephalic, without obvious abnormality, atraumatic Neck: no adenopathy and Surgical scar at the right neck area. Lymph nodes: Cervical, supraclavicular, and axillary nodes normal. Resp: clear to auscultation bilaterally Cardio: regular rate and rhythm, S1, S2 normal, no murmur, click, rub or gallop GI: soft, non-tender; bowel sounds normal; no masses,  no organomegaly Extremities: extremities normal, atraumatic, no cyanosis or edema Neurologic: Alert and oriented X 3, normal strength and tone. Normal symmetric reflexes. Normal coordination and gait  ECOG PERFORMANCE STATUS: 0 - Asymptomatic  Blood  pressure 142/96, pulse 89, temperature 97.5 F (36.4 C), temperature source Oral, height 5' 8.5" (1.74 m), weight 287 lb (130.182 kg).  LABORATORY DATA: Lab Results  Component Value Date   WBC 7.3 09/10/2011   HGB 14.6 09/10/2011   HCT 43.8 09/10/2011   MCV 83.6 09/10/2011   PLT 228 09/10/2011      Chemistry      Component Value Date/Time   NA 146* 09/10/2011 1429   NA 138 02/04/2011 1207   K 4.8* 09/10/2011 1429   K 4.6 02/04/2011 1207   CL 100 09/10/2011 1429   CL 102 02/04/2011 1207   CO2 30 09/10/2011 1429   CO2 30 02/04/2011 1207   BUN 11 09/10/2011  1429   BUN 9 02/04/2011 1207   CREATININE 1.2 09/10/2011 1429   CREATININE 1.0 02/04/2011 1207      Component Value Date/Time   CALCIUM 8.7 09/10/2011 1429   CALCIUM 9.2 02/04/2011 1207   ALKPHOS 71 09/10/2011 1429   ALKPHOS 72 02/04/2011 1207   AST 35 09/10/2011 1429   AST 27 02/04/2011 1207   ALT 42 02/04/2011 1207   BILITOT 0.60 09/10/2011 1429   BILITOT 0.3 02/04/2011 1207       RADIOGRAPHIC STUDIES: Ct Soft Tissue Neck W Contrast  09/10/2011  *RADIOLOGY REPORT*  Clinical Data: Melanoma diagnosed 2008.  Completion of immuno therapy 2009.  Lymph node resection of right neck.  CT scan of the chest, abdomen and pelvis performed the same date dictated separately.  CT NECK WITH CONTRAST  Technique:  Multidetector CT imaging of the neck was performed with intravenous contrast.  Contrast: OMNIPAQUE IOHEXOL 300 MG/ML  SOLN  Comparison: 09/09/2010 and 03/13/2009.  Findings: Postsurgical changes right neck status post neck dissection appear stable.  Dystrophic calcifications right temporalis and masseter muscle region stable.  Circumferential thickening of the supraglottic/posterior hypopharyngeal region with epiglottic thickening.  This may be related to under distension and post-therapy changes without asymmetry to indicate tumor.  This appearance is more similar to the 2010 exam than the most recent 2012 exam. If there are any symptoms referable to this region, direct visualization recommended.  Stable appearance of the left level IB lymph node measuring 1.5 x 1 cm transverse dimension.  Scattered small lymph nodes bilaterally unchanged.  No obvious new subcutaneous lesion.  Visualized intracranial structures and orbital structures unremarkable.  Cervical spondylotic changes with various degrees of spinal stenosis and foraminal narrowing.  IMPRESSION: Thickened appearance of the supraglottic/posterior hypopharyngeal region may be related to under distension. Otherwise stable post therapy exam.  Original  Report Authenticated By: Fuller Canada, M.D.   Ct Chest W Contrast  09/10/2011  *RADIOLOGY REPORT*  Clinical Data:  Melanoma  CT CHEST, ABDOMEN AND PELVIS WITH CONTRAST  Technique:  Multidetector CT imaging of the chest, abdomen and pelvis was performed following the standard protocol during bolus administration of intravenous contrast.  Contrast: OMNIPAQUE IOHEXOL 300 MG/ML  SOLN  Comparison:  09/09/2010  CT CHEST  Findings:  No enlarged axillary or supraclavicular lymph nodes.  There is no mediastinal or hilar adenopathy. No pericardial or pleural effusion.  There is no suspicious pulmonary nodule or mass identified.  Review of the visualized osseous structures is unremarkable.  IMPRESSION:  1.  Negative for mass or adenopathy.  CT ABDOMEN AND PELVIS  Findings:  There is diffuse fatty infiltration of the liver.  There is no suspicious liver abnormality.  The gallbladder appears normal.  The pancreas appears within normal limits.  Normal appearance of the spleen.  Both adrenal glands appear normal.  Tiny hypodensity within the upper pole of the right kidney is unchanged from previous exam measuring 7 mm.  This is too small to reliably characterize.  Other small renal hypodensities are also unchanged and remain too small to characterize.  The urinary bladder is normal.  The prostate gland and seminal vesicles are normal.  There are no enlarged lymph nodes within the upper abdomen or the pelvis.  The stomach and the small bowel loops appear within normal limits. Normal appearance of the colon.  No free fluid or fluid collections identified.  Survey of the body wall and subcutaneous soft tissues is unremarkable.  Review of the visualized osseous structures is negative.  IMPRESSION:  1.  No acute findings within the abdomen or pelvis.  2.  No metastatic disease.  Original Report Authenticated By: Rosealee Albee, M.D.   Ct Abdomen Pelvis W Contrast  09/10/2011  *RADIOLOGY REPORT*  Clinical Data:   Melanoma  CT CHEST, ABDOMEN AND PELVIS WITH CONTRAST  Technique:  Multidetector CT imaging of the chest, abdomen and pelvis was performed following the standard protocol during bolus administration of intravenous contrast.  Contrast: OMNIPAQUE IOHEXOL 300 MG/ML  SOLN  Comparison:  09/09/2010  CT CHEST  Findings:  No enlarged axillary or supraclavicular lymph nodes.  There is no mediastinal or hilar adenopathy. No pericardial or pleural effusion.  There is no suspicious pulmonary nodule or mass identified.  Review of the visualized osseous structures is unremarkable.  IMPRESSION:  1.  Negative for mass or adenopathy.  CT ABDOMEN AND PELVIS  Findings:  There is diffuse fatty infiltration of the liver.  There is no suspicious liver abnormality.  The gallbladder appears normal.  The pancreas appears within normal limits.  Normal appearance of the spleen.  Both adrenal glands appear normal.  Tiny hypodensity within the upper pole of the right kidney is unchanged from previous exam measuring 7 mm.  This is too small to reliably characterize.  Other small renal hypodensities are also unchanged and remain too small to characterize.  The urinary bladder is normal.  The prostate gland and seminal vesicles are normal.  There are no enlarged lymph nodes within the upper abdomen or the pelvis.  The stomach and the small bowel loops appear within normal limits. Normal appearance of the colon.  No free fluid or fluid collections identified.  Survey of the body wall and subcutaneous soft tissues is unremarkable.  Review of the visualized osseous structures is negative.  IMPRESSION:  1.  No acute findings within the abdomen or pelvis.  2.  No metastatic disease.  Original Report Authenticated By: Rosealee Albee, M.D.    ASSESSMENT: This is a very pleasant 51 years old white male with history of stage IIIB malignant melanoma status post wide excision followed by radical neck dissection as well as adjuvant immunotherapy  with interferon. The patient is doing fine and he has no evidence for disease recurrence.   PLAN: I discussed the scan results with the patient today. I recommended for him continuous observation with repeat CT scan of the neck, chest, abdomen and pelvis in one year. He would come back for followup visit at that time. He was advised to call me immediately if he has any concerning symptoms in the interval.  All questions were answered. The patient knows to call the clinic with any problems, questions or concerns. We can certainly see the patient much sooner if  necessary.

## 2011-09-15 NOTE — Telephone Encounter (Signed)
appt made and printed for pt  °

## 2011-11-12 ENCOUNTER — Other Ambulatory Visit: Payer: Self-pay | Admitting: Internal Medicine

## 2011-11-18 ENCOUNTER — Ambulatory Visit: Payer: BC Managed Care – PPO | Admitting: Internal Medicine

## 2011-12-15 ENCOUNTER — Ambulatory Visit (INDEPENDENT_AMBULATORY_CARE_PROVIDER_SITE_OTHER): Payer: BC Managed Care – PPO | Admitting: Internal Medicine

## 2011-12-15 ENCOUNTER — Encounter: Payer: Self-pay | Admitting: Internal Medicine

## 2011-12-15 VITALS — BP 130/80 | HR 80 | Temp 98.2°F | Resp 16 | Ht 68.5 in | Wt 286.0 lb

## 2011-12-15 DIAGNOSIS — C4320 Malignant melanoma of unspecified ear and external auricular canal: Secondary | ICD-10-CM

## 2011-12-15 DIAGNOSIS — T887XXA Unspecified adverse effect of drug or medicament, initial encounter: Secondary | ICD-10-CM

## 2011-12-15 DIAGNOSIS — E785 Hyperlipidemia, unspecified: Secondary | ICD-10-CM

## 2011-12-15 DIAGNOSIS — Z23 Encounter for immunization: Secondary | ICD-10-CM

## 2011-12-15 DIAGNOSIS — E119 Type 2 diabetes mellitus without complications: Secondary | ICD-10-CM

## 2011-12-15 LAB — LIPID PANEL
Cholesterol: 162 mg/dL (ref 0–200)
HDL: 37.4 mg/dL — ABNORMAL LOW (ref >39.00)
Total CHOL/HDL Ratio: 4
Triglycerides: 440 mg/dL — ABNORMAL HIGH (ref 0.0–149.0)

## 2011-12-15 LAB — HEPATIC FUNCTION PANEL
ALT: 42 U/L (ref 0–53)
Albumin: 3.8 g/dL (ref 3.5–5.2)
Total Bilirubin: 0.5 mg/dL (ref 0.3–1.2)

## 2011-12-15 LAB — LDL CHOLESTEROL, DIRECT: Direct LDL: 78.7 mg/dL

## 2011-12-15 NOTE — Patient Instructions (Signed)
Increase 70/30 insulin to 30 units twice a day

## 2011-12-15 NOTE — Progress Notes (Signed)
Subjective:    Patient ID: [name], male    DOB: February 25, 1961, 51 y.o.   MRN: [id number]  HPI Diabetic CBG's in the 170 and weight is increased due to lack of exercise and poor diet Risk of lipids discussed Morbid obesity discussed    Review of Systems  Constitutional: Negative for fever and fatigue.  HENT: Negative for hearing loss, congestion, neck pain and postnasal drip.   Eyes: Negative for discharge, redness and visual disturbance.  Respiratory: Negative for cough, shortness of breath and wheezing.   Cardiovascular: Negative for leg swelling.  Gastrointestinal: Negative for abdominal pain, constipation and abdominal distention.  Genitourinary: Negative for urgency and frequency.  Musculoskeletal: Negative for joint swelling and arthralgias.  Skin: Negative for color change and rash.  Neurological: Negative for weakness and light-headedness.  Hematological: Negative for adenopathy.  Psychiatric/Behavioral: Negative for behavioral problems.   Past Medical History  Diagnosis Date  . GERD (gastroesophageal reflux disease)   . Hyperlipidemia   . Metabolic syndrome   . Diabetes mellitus   . Cancer     melanoma    History   Social History  . Marital Status: Single    Spouse Name: N/A    Number of Children: N/A  . Years of Education: N/A   Occupational History  . Not on file.   Social History Main Topics  . Smoking status: Former Smoker    Quit date: 05/19/1988  . Smokeless tobacco: Not on file   Comment: quit in 1990  . Alcohol Use: No  . Drug Use: No  . Sexually Active: Yes   Other Topics Concern  . Not on file   Social History Narrative  . No narrative on file    Past Surgical History  Procedure Date  . Melanoma excision   . Tonsillectomy     age 12  . Reconstructive plastic surgery face     dog bite at age 58    Family History  Problem Relation Age of Onset  . Arthritis Mother   . Stroke Father     Allergies  Allergen Reactions  .  Codeine Nausea And Vomiting    Current Outpatient Prescriptions on File Prior to Visit  Medication Sig Dispense Refill  . amLODipine (NORVASC) 5 MG tablet TAKE 1 TABLET DAILY  90 tablet  1  . ARIPiprazole (ABILIFY) 5 MG tablet Take 5 mg by mouth daily.        Marland Kitchen buPROPion (WELLBUTRIN XL) 150 MG 24 hr tablet Take 3 tablets by mouth once daily       . CRESTOR 10 MG tablet TAKE 1 TABLET DAILY  90 tablet  1  . desvenlafaxine (PRISTIQ) 50 MG 24 hr tablet Take 50 mg by mouth daily.        Marland Kitchen glucose blood (FREESTYLE LITE) test strip Use as instructed three times daily       . insulin NPH-insulin regular (NOVOLIN 70/30) (70-30) 100 UNIT/ML injection 25u SQ before breakfast and dinner if you skip a meal do not take the insulin  20 mL  3  . KOMBIGLYZE XR 07-998 MG TB24 TAKE 1 TABLET DAILY  90 tablet  2  . pantoprazole (PROTONIX) 40 MG tablet TAKE 1 TABLET DAILY  90 tablet  3    BP 130/80  Pulse 80  Temp 98.2 F (36.8 C)  Resp 16  Ht 5' 8.5" (1.74 m)  Wt 286 lb (129.729 kg)  BMI 42.85 kg/m2       Objective:  Physical Exam  Nursing note and vitals reviewed. Constitutional: He appears well-developed and well-nourished.  HENT:  Head: Normocephalic and atraumatic.  Eyes: Conjunctivae normal are normal. Pupils are equal, round, and reactive to light.  Neck: Normal range of motion. Neck supple.  Cardiovascular: Normal rate and regular rhythm.   Pulmonary/Chest: Effort normal and breath sounds normal.  Abdominal: Soft. Bowel sounds are normal.          Assessment & Plan:  Patient's blood pressure stable on current medications patient's diabetes is probably out of control due to weight gain and dietary choices we'll 10 hemoglobin A1c see to assess this and increase his 7030 insulin doses by 5 units twice daily.  We'll monitor appropriate additional labs including a recent basic metabolic panel and a liver panel for the side effects of drugs   Paleo diet choices discussed

## 2011-12-20 NOTE — Progress Notes (Signed)
Quick Note:  Called and left a message for return call. ______ 

## 2011-12-21 ENCOUNTER — Other Ambulatory Visit: Payer: Self-pay | Admitting: Internal Medicine

## 2011-12-21 DIAGNOSIS — E119 Type 2 diabetes mellitus without complications: Secondary | ICD-10-CM

## 2011-12-21 NOTE — Progress Notes (Signed)
done

## 2011-12-29 ENCOUNTER — Other Ambulatory Visit: Payer: Self-pay | Admitting: Internal Medicine

## 2012-01-04 ENCOUNTER — Encounter: Payer: BC Managed Care – PPO | Attending: Internal Medicine | Admitting: *Deleted

## 2012-01-04 VITALS — Ht 68.0 in | Wt 282.6 lb

## 2012-01-04 DIAGNOSIS — E119 Type 2 diabetes mellitus without complications: Secondary | ICD-10-CM | POA: Insufficient documentation

## 2012-01-04 DIAGNOSIS — Z713 Dietary counseling and surveillance: Secondary | ICD-10-CM | POA: Insufficient documentation

## 2012-01-05 ENCOUNTER — Encounter: Payer: Self-pay | Admitting: *Deleted

## 2012-01-05 NOTE — Patient Instructions (Signed)
Goals:  Follow Diabetes Meal Plan as instructed  Eat 3 meals and 2 snacks, every 3-5 hrs  Limit carbohydrate intake to 60 grams carbohydrate/meal  Limit carbohydrate intake to 0-30 grams carbohydrate/snack  Add lean protein foods to meals/snacks  Monitor glucose levels as instructed by your doctor  Aim for 15 mins of physical activity daily and increase as tolerated  Bring food record and glucose log to your next nutrition visit

## 2012-01-05 NOTE — Progress Notes (Signed)
  Patient was seen on 01/04/2012 for the first of a series of three diabetes self-management courses at the [facility].  Current A1c on 12/21/2011 was 10.1% per pt. The following learning objectives were met by the patient during this course:   Defines the role of glucose and insulin  Identifies type of diabetes and pathophysiology  Defines the diagnostic criteria for diabetes and prediabetes  States the risk factors for Type 2 Diabetes  States the symptoms of Type 2 Diabetes  Defines Type 2 Diabetes treatment goals  Defines Type 2 Diabetes treatment options  States the rationale for glucose monitoring  Identifies A1C, glucose targets, and testing times  Identifies proper sharps disposal  Defines the purpose of a diabetes food plan  Identifies carbohydrate food groups  Defines effects of carbohydrate foods on glucose levels  Identifies carbohydrate choices/grams/food labels  States benefits of physical activity and effect on glucose  Review of suggested activity guidelines  Handouts given during class include:  Type 2 Diabetes: Basics Book  My Food Plan Book  Food and Activity Log  Follow-Up Plan: Core Class 2

## 2012-02-08 ENCOUNTER — Ambulatory Visit: Payer: BC Managed Care – PPO

## 2012-02-11 ENCOUNTER — Other Ambulatory Visit: Payer: Self-pay | Admitting: Internal Medicine

## 2012-02-22 ENCOUNTER — Ambulatory Visit: Payer: BC Managed Care – PPO

## 2012-03-07 ENCOUNTER — Ambulatory Visit: Payer: BC Managed Care – PPO

## 2012-03-15 ENCOUNTER — Ambulatory Visit: Payer: BC Managed Care – PPO | Admitting: Internal Medicine

## 2012-04-18 ENCOUNTER — Ambulatory Visit: Payer: BC Managed Care – PPO

## 2012-05-02 ENCOUNTER — Ambulatory Visit: Payer: BC Managed Care – PPO

## 2012-05-08 ENCOUNTER — Other Ambulatory Visit: Payer: Self-pay | Admitting: Internal Medicine

## 2012-06-16 ENCOUNTER — Ambulatory Visit (INDEPENDENT_AMBULATORY_CARE_PROVIDER_SITE_OTHER): Payer: BC Managed Care – PPO | Admitting: Internal Medicine

## 2012-06-16 ENCOUNTER — Encounter: Payer: Self-pay | Admitting: Internal Medicine

## 2012-06-16 VITALS — BP 150/76 | HR 84 | Temp 98.2°F | Resp 16 | Ht 68.5 in | Wt 278.0 lb

## 2012-06-16 DIAGNOSIS — E785 Hyperlipidemia, unspecified: Secondary | ICD-10-CM

## 2012-06-16 DIAGNOSIS — I10 Essential (primary) hypertension: Secondary | ICD-10-CM

## 2012-06-16 DIAGNOSIS — E119 Type 2 diabetes mellitus without complications: Secondary | ICD-10-CM

## 2012-06-16 MED ORDER — AMLODIPINE BESY-BENAZEPRIL HCL 5-20 MG PO CAPS: 1.0000 | ORAL_CAPSULE | Freq: Every day | ORAL | Status: DC

## 2012-06-16 MED ORDER — EXENATIDE ER 2 MG ~~LOC~~ SUSR: 2.0000 mg | SUBCUTANEOUS | Status: DC

## 2012-06-16 NOTE — Progress Notes (Signed)
Subjective:    Patient ID: [name], male    DOB: 07-Oct-1960, 52 y.o.   MRN: [id number]  HPI monitoring of DM and obesity Has noted increased blood pressure without chest pain No weight loss  Poor eating habits and control of blood sugars   Review of Systems  Constitutional: Negative for fever and fatigue.  HENT: Negative for hearing loss, congestion, neck pain and postnasal drip.   Eyes: Negative for discharge, redness and visual disturbance.  Respiratory: Negative for cough, shortness of breath and wheezing.   Cardiovascular: Negative for leg swelling.  Gastrointestinal: Negative for abdominal pain, constipation and abdominal distention.  Genitourinary: Negative for urgency and frequency.  Musculoskeletal: Negative for joint swelling and arthralgias.  Skin: Negative for color change and rash.  Neurological: Negative for weakness and light-headedness.  Hematological: Negative for adenopathy.  Psychiatric/Behavioral: Negative for behavioral problems.   Past Medical History  Diagnosis Date  . GERD (gastroesophageal reflux disease)   . Hyperlipidemia   . Metabolic syndrome   . Diabetes mellitus   . Cancer     melanoma  . Hypertension   . Obesity     History   Social History  . Marital Status: Single    Spouse Name: N/A    Number of Children: N/A  . Years of Education: N/A   Occupational History  . Not on file.   Social History Main Topics  . Smoking status: Former Smoker    Quit date: 05/19/1988  . Smokeless tobacco: Not on file     Comment: quit in 1990  . Alcohol Use: Yes  . Drug Use: No  . Sexually Active: Yes   Other Topics Concern  . Not on file   Social History Narrative  . No narrative on file    Past Surgical History  Procedure Laterality Date  . Melanoma excision    . Tonsillectomy      age 65  . Reconstructive plastic surgery face      dog bite at age 55    Family History  Problem Relation Age of Onset  . Arthritis Mother   .  Stroke Father     Allergies  Allergen Reactions  . Codeine Nausea And Vomiting  . Food     Coconut flakes    Current Outpatient Prescriptions on File Prior to Visit  Medication Sig Dispense Refill  . amLODipine (NORVASC) 5 MG tablet TAKE 1 TABLET DAILY  90 tablet  3  . ARIPiprazole (ABILIFY) 5 MG tablet Take 5 mg by mouth daily.        Marland Kitchen buPROPion (WELLBUTRIN XL) 150 MG 24 hr tablet Take 3 tablets by mouth once daily       . CRESTOR 10 MG tablet TAKE 1 TABLET DAILY  90 tablet  1  . desvenlafaxine (PRISTIQ) 50 MG 24 hr tablet Take 50 mg by mouth daily.        Marland Kitchen glucose blood (FREESTYLE LITE) test strip Use as instructed three times daily       . KOMBIGLYZE XR 07-998 MG TB24 TAKE 1 TABLET DAILY  90 tablet  1  . NOVOLIN 70/30 (70-30) 100 UNIT/ML injection INJECT 25 UNITS UNDER THE SKIN BEFORE BREAKFAST AND DINNER, IF YOU SKIP A MEAL DO NOT TAKE THE INSULIN  20 mL  2  . pantoprazole (PROTONIX) 40 MG tablet TAKE 1 TABLET DAILY  90 tablet  3   No current facility-administered medications on file prior to visit.    BP 150/76  Pulse 84  Temp(Src) 98.2 F (36.8 C)  Resp 16  Ht 5' 8.5" (1.74 m)  Wt 278 lb (126.1 kg)  BMI 41.65 kg/m2       Objective:   Physical Exam  Nursing note and vitals reviewed. Constitutional: He appears well-developed and well-nourished.  obese  HENT:  Head: Normocephalic and atraumatic.  Eyes: Conjunctivae are normal. Pupils are equal, round, and reactive to light.  Neck: Normal range of motion. Neck supple.  Cardiovascular: Normal rate and regular rhythm.   Pulmonary/Chest: Effort normal and breath sounds normal.  Abdominal: Soft. Bowel sounds are normal.          Assessment & Plan:  Worsening of glucose and blood pressure control.  The LAD lisinopril and a combination format with his amlodipine for blood pressure control.  His diastolic number is appropriate but his systolic number is elevated in the Lotrel should help [place] to control the  systolic hypertension.  Weight loss is an ideal solution to both the lack of diabetic control and hypertensive control and we urged the patient to take control of his diet lemonade as many glutens and simple carbohydrates as possible including simple sugars the lower his blood glucose and his weight in the interim We again had an injectable agent to the Novolin N might help him with his weight and appetite  Taught the bydureon program

## 2012-06-16 NOTE — Patient Instructions (Addendum)
The patient is instructed to continue all medications as prescribed. Schedule followup with check out clerk upon leaving the clinic Use the bydureon next week

## 2012-06-26 ENCOUNTER — Other Ambulatory Visit: Payer: Self-pay | Admitting: Internal Medicine

## 2012-07-26 ENCOUNTER — Other Ambulatory Visit: Payer: Self-pay | Admitting: Internal Medicine

## 2012-08-04 ENCOUNTER — Telehealth: Payer: Self-pay | Admitting: Internal Medicine

## 2012-08-04 NOTE — Telephone Encounter (Signed)
Patient Information:  Caller Name: Orin  Phone: [phone]  Patient: Dustin, Davis  Gender: Male  DOB: 1960/09/10  Age: 52 Years  PCP: Darryll Capers (Adults only)  Office Follow Up:  Does the office need to follow up with this patient?: No  Instructions For The Office: N/A  RN Note:  Appointment made for 08/10/2012 at 10:45 with FNP Orvan Falconer.  Symptoms  Reason For Call & Symptoms: Onset 5 weeks of GI issues, mostly bloated with nausea and vomiting, usually starts 6 hours after injection and lasts 24-48 hours, at times the entire week.  Reviewed Health History In EMR: Yes  Reviewed Medications In EMR: Yes  Reviewed Allergies In EMR: Yes  Reviewed Surgeries / Procedures: Yes  Date of Onset of Symptoms: 06/30/2012  Treatments Tried: Pepto Bismol, anti-nausea OTC  Treatments Tried Worked: No  Guideline(s) Used:  Vomiting  Disposition Per Guideline:   See Today in Office  Reason For Disposition Reached:   Patient wants to be seen  Advice Given:  Clear Liquids:  Sip water or a rehydration drink (e.g., Gatorade or Powerade).  Other options: 1/2 strength flat lemon-lime soda or ginger ale.  After 4 hours without vomiting, increase the amount.  Avoid Nonprescription Medicines:  Stop all nonprescription medicines for 24 hours (Reason: they may make vomiting worse).  Call if vomiting a prescription medicine.  Call Back If:  Vomiting lasts for more than 2 days (48 hours)  Signs of dehydration occur  You become worse.  Patient Will Follow Care Advice:  YES  Appointment Scheduled:  08/10/2012 10:45:00 Appointment Scheduled Provider:  Adline Mango [employer])

## 2012-08-10 ENCOUNTER — Ambulatory Visit (INDEPENDENT_AMBULATORY_CARE_PROVIDER_SITE_OTHER): Payer: BC Managed Care – PPO | Admitting: Family

## 2012-08-10 ENCOUNTER — Encounter: Payer: Self-pay | Admitting: Family

## 2012-08-10 VITALS — BP 122/80 | HR 91 | Wt 271.0 lb

## 2012-08-10 DIAGNOSIS — E1165 Type 2 diabetes mellitus with hyperglycemia: Secondary | ICD-10-CM

## 2012-08-10 DIAGNOSIS — R197 Diarrhea, unspecified: Secondary | ICD-10-CM

## 2012-08-10 DIAGNOSIS — R112 Nausea with vomiting, unspecified: Secondary | ICD-10-CM

## 2012-08-10 DIAGNOSIS — E118 Type 2 diabetes mellitus with unspecified complications: Secondary | ICD-10-CM

## 2012-08-10 MED ORDER — ONDANSETRON HCL 8 MG PO TABS: 8.0000 mg | ORAL_TABLET | Freq: Three times a day (TID) | ORAL | Status: DC | PRN

## 2012-08-10 NOTE — Progress Notes (Signed)
Subjective:    Patient ID: [name], male    DOB: 02/04/1961, 52 y.o.   MRN: [id number]  HPI 52 year old white male, nonsmoker, patient of Dr. Lovell Sheehan is in today with complaints of diarrhea and nausea that's been occurring since taking Bydureon. However today, although last week he feels like his symptoms have improved. The diarrhea is typically daily occurring one to 3 times a day. The nausea waxes and wanes. He's had one episode this week. However, he's had a daily in the past. His fasting blood sugars are in the 130s which is the lowest they've ever been. Last hemoglobin A1c was 10.5. He denies any polyuria or polydipsia.   Review of Systems  Constitutional: Negative.   HENT: Negative.   Cardiovascular: Negative.   Gastrointestinal: Positive for nausea, vomiting and diarrhea.  Endocrine: Negative.  Negative for polydipsia, polyphagia and polyuria.  Genitourinary: Negative.   Neurological: Negative.   Hematological: Negative.   Psychiatric/Behavioral: Negative.    Past Medical History  Diagnosis Date  . GERD (gastroesophageal reflux disease)   . Hyperlipidemia   . Metabolic syndrome   . Diabetes mellitus   . Cancer     melanoma  . Hypertension   . Obesity     History   Social History  . Marital Status: Single    Spouse Name: N/A    Number of Children: N/A  . Years of Education: N/A   Occupational History  . Not on file.   Social History Main Topics  . Smoking status: Former Smoker    Quit date: 05/19/1988  . Smokeless tobacco: Not on file     Comment: quit in 1990  . Alcohol Use: Yes  . Drug Use: No  . Sexually Active: Yes   Other Topics Concern  . Not on file   Social History Narrative  . No narrative on file    Past Surgical History  Procedure Laterality Date  . Melanoma excision    . Tonsillectomy      age 35  . Reconstructive plastic surgery face      dog bite at age 64    Family History  Problem Relation Age of Onset  . Arthritis Mother    . Stroke Father     Allergies  Allergen Reactions  . Codeine Nausea And Vomiting  . Food     Coconut flakes    Current Outpatient Prescriptions on File Prior to Visit  Medication Sig Dispense Refill  . amLODipine-benazepril (LOTREL) 5-20 MG per capsule Take 1 capsule by mouth daily.  90 capsule  3  . ARIPiprazole (ABILIFY) 5 MG tablet Take 5 mg by mouth daily.        Marland Kitchen buPROPion (WELLBUTRIN XL) 150 MG 24 hr tablet Take 3 tablets by mouth once daily       . CRESTOR 10 MG tablet TAKE 1 TABLET DAILY  90 tablet  1  . desvenlafaxine (PRISTIQ) 50 MG 24 hr tablet Take 50 mg by mouth daily.        . Exenatide (BYDUREON) 2 MG SUSR Inject 2 mg into the skin once a week.  4 each  11  . glucose blood (FREESTYLE LITE) test strip Use as instructed three times daily       . KOMBIGLYZE XR 07-998 MG TB24 TAKE 1 TABLET DAILY  90 tablet  0  . NOVOLIN 70/30 (70-30) 100 UNIT/ML injection INJECT 25 UNITS UNDER THE SKIN BEFORE BREAKFAST AND DINNER, IF YOU SKIP A MEAL DO NOT  TAKE THE INSULIN  20 mL  3  . pantoprazole (PROTONIX) 40 MG tablet TAKE 1 TABLET DAILY  90 tablet  3  . amLODipine (NORVASC) 5 MG tablet TAKE 1 TABLET DAILY  90 tablet  3   No current facility-administered medications on file prior to visit.    BP 122/80  Pulse 91  Wt 271 lb (122.925 kg)  BMI 40.6 kg/m2  SpO2 98%chart    Objective:   Physical Exam  Constitutional: He is oriented to person, place, and time. He appears well-developed and well-nourished.  Neck: Normal range of motion. Neck supple.  Cardiovascular: Normal rate, regular rhythm and normal heart sounds.   Pulmonary/Chest: Effort normal and breath sounds normal.  Neurological: He is alert and oriented to person, place, and time.  Skin: Skin is warm and dry.  Psychiatric: He has a normal mood and affect.          Assessment & Plan:  Assessment:  1. Type 2 diabetes-uncontrolled 2. Medication intolerance 3. Diarrhea 4. Nausea  Plan: Patient has elected to  try the medication for additional week to 2 weeks to see if his symptoms continue to improve. They do, he would like to stay with his current medication regimen. However if not, I advised him that we can switch the medication to possibly Levemir or Lantus once daily. He will communicate with me or Dr. Lovell Sheehan through my chart. Since he has periodic nausea, Zofran 8 mg one tablet 3 times a day as needed.

## 2012-08-30 ENCOUNTER — Telehealth: Payer: Self-pay | Admitting: Internal Medicine

## 2012-08-30 NOTE — Telephone Encounter (Signed)
pt called to cancel appts due to ins ded increase and he could not afford care...will call back to r/s

## 2012-09-06 ENCOUNTER — Other Ambulatory Visit (INDEPENDENT_AMBULATORY_CARE_PROVIDER_SITE_OTHER): Payer: BC Managed Care – PPO

## 2012-09-06 DIAGNOSIS — E119 Type 2 diabetes mellitus without complications: Secondary | ICD-10-CM

## 2012-09-06 DIAGNOSIS — I10 Essential (primary) hypertension: Secondary | ICD-10-CM

## 2012-09-06 LAB — BASIC METABOLIC PANEL
CO2: 30 mEq/L (ref 19–32)
Calcium: 9 mg/dL (ref 8.4–10.5)
Creatinine, Ser: 1 mg/dL (ref 0.4–1.5)
GFR: 83.15 mL/min (ref >60.00)
Sodium: 135 mEq/L (ref 135–145)

## 2012-09-06 LAB — HEMOGLOBIN A1C: Hgb A1c MFr Bld: 7.4 % — ABNORMAL HIGH (ref 4.6–6.5)

## 2012-09-12 ENCOUNTER — Ambulatory Visit (HOSPITAL_COMMUNITY): Payer: BC Managed Care – PPO

## 2012-09-12 ENCOUNTER — Other Ambulatory Visit: Payer: BC Managed Care – PPO | Admitting: Lab

## 2012-09-13 ENCOUNTER — Encounter: Payer: Self-pay | Admitting: Internal Medicine

## 2012-09-13 ENCOUNTER — Ambulatory Visit (INDEPENDENT_AMBULATORY_CARE_PROVIDER_SITE_OTHER): Payer: BC Managed Care – PPO | Admitting: Internal Medicine

## 2012-09-13 VITALS — BP 136/84 | HR 84 | Temp 98.1°F | Resp 18 | Ht 68.5 in | Wt 272.0 lb

## 2012-09-13 DIAGNOSIS — F329 Major depressive disorder, single episode, unspecified: Secondary | ICD-10-CM

## 2012-09-13 NOTE — Addendum Note (Signed)
Addended by: Stacie Glaze on: 09/13/2012 12:09 PM   Modules accepted: Orders

## 2012-09-13 NOTE — Patient Instructions (Signed)
The patient is instructed to continue all medications as prescribed. Schedule followup with check out clerk upon leaving the clinic  

## 2012-09-13 NOTE — Progress Notes (Signed)
Subjective:    Patient ID: [name], male    DOB: 17-Apr-1960, 52 y.o.   MRN: [id number]  HPI Follow up for DM with with improved CBG's Weight stable A1c dropped form 10 to 7.4    Review of Systems  Constitutional: Negative for fever and fatigue.  HENT: Negative for hearing loss, congestion, neck pain and postnasal drip.   Eyes: Negative for discharge, redness and visual disturbance.  Respiratory: Negative for cough, shortness of breath and wheezing.   Cardiovascular: Negative for leg swelling.  Gastrointestinal: Negative for abdominal pain, constipation and abdominal distention.  Genitourinary: Negative for urgency and frequency.  Musculoskeletal: Negative for joint swelling and arthralgias.  Skin: Negative for color change and rash.  Neurological: Negative for weakness and light-headedness.  Hematological: Negative for adenopathy.  Psychiatric/Behavioral: Negative for behavioral problems.   Past Medical History  Diagnosis Date  . GERD (gastroesophageal reflux disease)   . Hyperlipidemia   . Metabolic syndrome   . Diabetes mellitus   . Cancer     melanoma  . Hypertension   . Obesity     History   Social History  . Marital Status: Single    Spouse Name: N/A    Number of Children: N/A  . Years of Education: N/A   Occupational History  . Not on file.   Social History Main Topics  . Smoking status: Former Smoker    Quit date: 05/19/1988  . Smokeless tobacco: Not on file     Comment: quit in 1990  . Alcohol Use: Yes  . Drug Use: No  . Sexually Active: Yes   Other Topics Concern  . Not on file   Social History Narrative  . No narrative on file    Past Surgical History  Procedure Laterality Date  . Melanoma excision    . Tonsillectomy      age 78  . Reconstructive plastic surgery face      dog bite at age 37    Family History  Problem Relation Age of Onset  . Arthritis Mother   . Stroke Father     Allergies  Allergen Reactions  . Codeine  Nausea And Vomiting  . Food     Coconut flakes    Current Outpatient Prescriptions on File Prior to Visit  Medication Sig Dispense Refill  . amLODipine-benazepril (LOTREL) 5-20 MG per capsule Take 1 capsule by mouth daily.  90 capsule  3  . ARIPiprazole (ABILIFY) 5 MG tablet Take 5 mg by mouth daily.        Marland Kitchen buPROPion (WELLBUTRIN XL) 150 MG 24 hr tablet Take 3 tablets by mouth once daily       . CRESTOR 10 MG tablet TAKE 1 TABLET DAILY  90 tablet  1  . desvenlafaxine (PRISTIQ) 50 MG 24 hr tablet Take 50 mg by mouth daily.        . Exenatide (BYDUREON) 2 MG SUSR Inject 2 mg into the skin once a week.  4 each  11  . glucose blood (FREESTYLE LITE) test strip Use as instructed three times daily       . KOMBIGLYZE XR 07-998 MG TB24 TAKE 1 TABLET DAILY  90 tablet  0  . NOVOLIN 70/30 (70-30) 100 UNIT/ML injection INJECT 25 UNITS UNDER THE SKIN BEFORE BREAKFAST AND DINNER, IF YOU SKIP A MEAL DO NOT TAKE THE INSULIN  20 mL  3  . ondansetron (ZOFRAN) 8 MG tablet Take 1 tablet (8 mg total) by mouth every  8 (eight) hours as needed for nausea.  30 tablet  2  . pantoprazole (PROTONIX) 40 MG tablet TAKE 1 TABLET DAILY  90 tablet  3   No current facility-administered medications on file prior to visit.    BP 136/84  Pulse 84  Temp(Src) 98.1 F (36.7 C)  Resp 18  Ht 5' 8.5" (1.74 m)  Wt 272 lb (123.378 kg)  BMI 40.75 kg/m2       Objective:   Physical Exam  Nursing note and vitals reviewed. Constitutional: He appears well-developed and well-nourished.  HENT:  Head: Normocephalic and atraumatic.  Eyes: Conjunctivae are normal. Pupils are equal, round, and reactive to light.  Neck: Normal range of motion. Neck supple.  Cardiovascular: Normal rate and regular rhythm.   Pulmonary/Chest: Effort normal and breath sounds normal.  Abdominal: Soft. Bowel sounds are normal.          Assessment & Plan:  Stable on the bydureon Stable mood on the pristiq Had a dip off the abilify  Continue  the plan with out change

## 2012-09-14 ENCOUNTER — Ambulatory Visit: Payer: BC Managed Care – PPO | Admitting: Internal Medicine

## 2012-11-08 ENCOUNTER — Other Ambulatory Visit: Payer: Self-pay | Admitting: Internal Medicine

## 2012-11-24 ENCOUNTER — Other Ambulatory Visit: Payer: Self-pay | Admitting: Internal Medicine

## 2012-12-12 ENCOUNTER — Other Ambulatory Visit: Payer: Self-pay | Admitting: Internal Medicine

## 2013-01-05 ENCOUNTER — Other Ambulatory Visit (INDEPENDENT_AMBULATORY_CARE_PROVIDER_SITE_OTHER): Payer: BC Managed Care – PPO

## 2013-01-05 LAB — HEPATIC FUNCTION PANEL
AST: 26 U/L (ref 0–37)
Albumin: 4.1 g/dL (ref 3.5–5.2)
Total Bilirubin: 0.5 mg/dL (ref 0.3–1.2)

## 2013-01-05 LAB — HEMOGLOBIN A1C: Hgb A1c MFr Bld: 8 % — ABNORMAL HIGH (ref 4.6–6.5)

## 2013-01-05 LAB — LIPID PANEL
HDL: 54.9 mg/dL (ref >39.00)
Total CHOL/HDL Ratio: 3
Triglycerides: 282 mg/dL — ABNORMAL HIGH (ref 0.0–149.0)
VLDL: 56.4 mg/dL — ABNORMAL HIGH (ref 0.0–40.0)

## 2013-01-05 LAB — BASIC METABOLIC PANEL
CO2: 30 mEq/L (ref 19–32)
Chloride: 99 mEq/L (ref 96–112)
Glucose, Bld: 193 mg/dL — ABNORMAL HIGH (ref 70–99)
Potassium: 4.7 mEq/L (ref 3.5–5.1)
Sodium: 138 mEq/L (ref 135–145)

## 2013-01-05 LAB — LDL CHOLESTEROL, DIRECT: Direct LDL: 101.9 mg/dL

## 2013-01-12 ENCOUNTER — Ambulatory Visit: Payer: BC Managed Care – PPO | Admitting: Internal Medicine

## 2013-01-19 ENCOUNTER — Ambulatory Visit: Payer: BC Managed Care – PPO | Admitting: Internal Medicine

## 2013-01-26 ENCOUNTER — Encounter: Payer: Self-pay | Admitting: Internal Medicine

## 2013-01-26 ENCOUNTER — Ambulatory Visit (INDEPENDENT_AMBULATORY_CARE_PROVIDER_SITE_OTHER): Payer: BC Managed Care – PPO | Admitting: Internal Medicine

## 2013-01-26 VITALS — BP 130/80 | HR 88 | Temp 98.5°F | Resp 16 | Ht 68.5 in | Wt 285.0 lb

## 2013-01-26 DIAGNOSIS — E119 Type 2 diabetes mellitus without complications: Secondary | ICD-10-CM

## 2013-01-26 DIAGNOSIS — Z23 Encounter for immunization: Secondary | ICD-10-CM

## 2013-01-26 DIAGNOSIS — F329 Major depressive disorder, single episode, unspecified: Secondary | ICD-10-CM

## 2013-01-26 DIAGNOSIS — F3289 Other specified depressive episodes: Secondary | ICD-10-CM

## 2013-01-26 DIAGNOSIS — E785 Hyperlipidemia, unspecified: Secondary | ICD-10-CM

## 2013-01-26 DIAGNOSIS — Z0000 Encounter for general adult medical examination without abnormal findings: Secondary | ICD-10-CM

## 2013-01-26 MED ORDER — LIRAGLUTIDE 18 MG/3ML ~~LOC~~ SOPN: 1.8000 mg | PEN_INJECTOR | Freq: Every day | SUBCUTANEOUS | Status: DC

## 2013-01-26 NOTE — Progress Notes (Signed)
Subjective:    Patient ID: [name], male    DOB: May 22, 1960, 52 y.o.   MRN: [id number]  HPI Patient is a 52 year old male with bipolar disorder adult-onset diabetes hyperlipidemia and morbid obesity.  He has not been able to lose weight initially he did lose weight when he first put him on an injectable that he now has regressed and has gained weight and his A1c reflects some loss of control.     Review of Systems  Constitutional: Positive for activity change, fatigue and unexpected weight change. Negative for fever.  HENT: Negative for congestion, hearing loss and postnasal drip.   Eyes: Negative for discharge, redness and visual disturbance.  Respiratory: Negative for cough, shortness of breath and wheezing.   Cardiovascular: Negative for leg swelling.  Gastrointestinal: Negative for abdominal pain, constipation and abdominal distention.  Genitourinary: Negative for urgency and frequency.  Musculoskeletal: Negative for arthralgias, joint swelling and neck pain.  Skin: Negative for color change and rash.  Neurological: Negative for weakness and light-headedness.  Hematological: Negative for adenopathy.  Psychiatric/Behavioral: Negative for behavioral problems.   Past Medical History  Diagnosis Date  . GERD (gastroesophageal reflux disease)   . Hyperlipidemia   . Metabolic syndrome   . Diabetes mellitus   . Cancer     melanoma  . Hypertension   . Obesity     History   Social History  . Marital Status: Single    Spouse Name: N/A    Number of Children: N/A  . Years of Education: N/A   Occupational History  . Not on file.   Social History Main Topics  . Smoking status: Former Smoker    Quit date: 05/19/1988  . Smokeless tobacco: Not on file     Comment: quit in 1990  . Alcohol Use: Yes  . Drug Use: No  . Sexual Activity: Yes   Other Topics Concern  . Not on file   Social History Narrative  . No narrative on file    Past Surgical History  Procedure  Laterality Date  . Melanoma excision    . Tonsillectomy      age 52  . Reconstructive plastic surgery face      dog bite at age 78    Family History  Problem Relation Age of Onset  . Arthritis Mother   . Stroke Father     Allergies  Allergen Reactions  . Codeine Nausea And Vomiting  . Food     Coconut flakes    Current Outpatient Prescriptions on File Prior to Visit  Medication Sig Dispense Refill  . amLODipine-benazepril (LOTREL) 5-20 MG per capsule Take 1 capsule by mouth daily.  90 capsule  3  . ARIPiprazole (ABILIFY) 5 MG tablet Take 5 mg by mouth daily.        Marland Kitchen buPROPion (WELLBUTRIN XL) 150 MG 24 hr tablet Take 3 tablets by mouth once daily       . CRESTOR 10 MG tablet TAKE 1 TABLET DAILY  90 tablet  3  . desvenlafaxine (PRISTIQ) 50 MG 24 hr tablet Take 50 mg by mouth daily.        Marland Kitchen glucose blood (FREESTYLE LITE) test strip Use as instructed three times daily       . KOMBIGLYZE XR 07-998 MG TB24 TAKE 1 TABLET DAILY  90 tablet  0  . NOVOLIN 70/30 (70-30) 100 UNIT/ML injection INJECT 25 UNITS UNDER THE SKIN BEFORE BREAKFAST AND DINNER, IF YOU SKIP A MEAL DO NOT  TAKE THE INSULIN  12 mL  11  . ondansetron (ZOFRAN) 8 MG tablet Take 1 tablet (8 mg total) by mouth every 8 (eight) hours as needed for nausea.  30 tablet  2  . pantoprazole (PROTONIX) 40 MG tablet TAKE 1 TABLET DAILY  90 tablet  2   No current facility-administered medications on file prior to visit.    BP 130/80  Pulse 88  Temp(Src) 98.5 F (36.9 C)  Resp 16  Ht 5' 8.5" (1.74 m)  Wt 285 lb (129.275 kg)  BMI 42.7 kg/m2       Objective:   Physical Exam  Constitutional: He appears well-developed and well-nourished.  HENT:  Head: Normocephalic and atraumatic.  Eyes: Conjunctivae are normal. Pupils are equal, round, and reactive to light.  Neck: Normal range of motion. Neck supple.  Cardiovascular: Normal rate and regular rhythm.   Pulmonary/Chest: Effort normal and breath sounds normal.  Abdominal:  Soft. Bowel sounds are normal.          Assessment & Plan:  Change to a daily injection of victoza 1.8 Monitor weight and blood glucose control repeat A1c.  He will also be due a lipid and a liver for monitoring of his Crestor treatment for hyperlipidemia her goal for an A1c less than 7 and an LDL less than 100

## 2013-01-26 NOTE — Patient Instructions (Signed)
The patient is instructed to continue all medications as prescribed. Schedule followup with check out clerk upon leaving the clinic  

## 2013-02-05 ENCOUNTER — Other Ambulatory Visit: Payer: Self-pay | Admitting: Internal Medicine

## 2013-02-28 ENCOUNTER — Encounter: Payer: Self-pay | Admitting: Gastroenterology

## 2013-04-17 ENCOUNTER — Telehealth: Payer: Self-pay | Admitting: *Deleted

## 2013-04-17 ENCOUNTER — Ambulatory Visit (AMBULATORY_SURGERY_CENTER): Payer: Self-pay | Admitting: *Deleted

## 2013-04-17 VITALS — Ht 69.0 in | Wt 285.4 lb

## 2013-04-17 DIAGNOSIS — Z1211 Encounter for screening for malignant neoplasm of colon: Secondary | ICD-10-CM

## 2013-04-17 MED ORDER — MOVIPREP 100 G PO SOLR: ORAL | Status: DC

## 2013-04-17 NOTE — Telephone Encounter (Signed)
[name],  He is cleared for [facility].  Thanks,  Jenny Reichmann

## 2013-04-17 NOTE — Progress Notes (Signed)
No allergies to eggs or soy. No problems with anesthesia.  

## 2013-04-17 NOTE — Telephone Encounter (Signed)
[name]: pt is scheduled for screening colonoscopy with Dr. Fuller Plan on 05/01/13.  Pt has had 15 reconstructive surgeries on the right side of face and mouth as a result of dog bite at age 53. 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   He has some limitation of opening right side of mouth fully but can open left side with no problems.  He has never been told that there are problems with intubation during surgery.  He also has had 2 EGD's at [facility] with Dr. Fuller Plan with no problems.  Is he okay for [facility]?  Thanks, Juliann Pulse

## 2013-04-17 NOTE — Telephone Encounter (Signed)
Pt notified that procedure will be at [facility] as scheduled

## 2013-04-26 ENCOUNTER — Encounter: Payer: Self-pay | Admitting: Gastroenterology

## 2013-05-01 ENCOUNTER — Ambulatory Visit (AMBULATORY_SURGERY_CENTER): Payer: BC Managed Care – PPO | Admitting: Gastroenterology

## 2013-05-01 ENCOUNTER — Encounter: Payer: Self-pay | Admitting: Gastroenterology

## 2013-05-01 VITALS — BP 132/92 | HR 85 | Temp 96.5°F | Resp 13

## 2013-05-01 DIAGNOSIS — D126 Benign neoplasm of colon, unspecified: Secondary | ICD-10-CM

## 2013-05-01 DIAGNOSIS — Z1211 Encounter for screening for malignant neoplasm of colon: Secondary | ICD-10-CM

## 2013-05-01 HISTORY — PX: COLONOSCOPY: SHX174

## 2013-05-01 LAB — GLUCOSE, CAPILLARY
GLUCOSE-CAPILLARY: 162 mg/dL — AB (ref 70–99)
Glucose-Capillary: 143 mg/dL — ABNORMAL HIGH (ref 70–99)

## 2013-05-01 MED ORDER — SODIUM CHLORIDE 0.9 % IV SOLN: 500.0000 mL | INTRAVENOUS | Status: DC

## 2013-05-01 NOTE — Patient Instructions (Signed)
Discharge instructions given with verbal understanding. Handouts on polyps and hemorrhoids. Resume previous medications. YOU HAD AN ENDOSCOPIC PROCEDURE TODAY AT THE [facility]: Refer to the procedure report that was given to you for any specific questions about what was found during the examination.  If the procedure report does not answer your questions, please call your gastroenterologist to clarify.  If you requested that your care partner not be given the details of your procedure findings, then the procedure report has been included in a sealed envelope for you to review at your convenience later.  YOU SHOULD EXPECT: Some feelings of bloating in the abdomen. Passage of more gas than usual.  Walking can help get rid of the air that was put into your GI tract during the procedure and reduce the bloating. If you had a lower endoscopy (such as a colonoscopy or flexible sigmoidoscopy) you may notice spotting of blood in your stool or on the toilet paper. If you underwent a bowel prep for your procedure, then you may not have a normal bowel movement for a few days.  DIET: Your first meal following the procedure should be a light meal and then it is ok to progress to your normal diet.  A half-sandwich or bowl of soup is an example of a good first meal.  Heavy or fried foods are harder to digest and may make you feel nauseous or bloated.  Likewise meals heavy in dairy and vegetables can cause extra gas to form and this can also increase the bloating.  Drink plenty of fluids but you should avoid alcoholic beverages for 24 hours.  ACTIVITY: Your care partner should take you home directly after the procedure.  You should plan to take it easy, moving slowly for the rest of the day.  You can resume normal activity the day after the procedure however you should NOT DRIVE or use heavy machinery for 24 hours (because of the sedation medicines used during the test).    SYMPTOMS TO REPORT  IMMEDIATELY: A gastroenterologist can be reached at any hour.  During normal business hours, 8:30 AM to 5:00 PM Monday through Friday, call [phone].  After hours and on weekends, please call the GI answering service at [phone] who will take a message and have the physician on call contact you.   Following lower endoscopy (colonoscopy or flexible sigmoidoscopy):  Excessive amounts of blood in the stool  Significant tenderness or worsening of abdominal pains  Swelling of the abdomen that is new, acute  Fever of 100F or higher  FOLLOW UP: If any biopsies were taken you will be contacted by phone or by letter within the next 1-3 weeks.  Call your gastroenterologist if you have not heard about the biopsies in 3 weeks.  Our staff will call the home number listed on your records the next business day following your procedure to check on you and address any questions or concerns that you may have at that time regarding the information given to you following your procedure. This is a courtesy call and so if there is no answer at the home number and we have not heard from you through the emergency physician on call, we will assume that you have returned to your regular daily activities without incident.  SIGNATURES/CONFIDENTIALITY: You and/or your care partner have signed paperwork which will be entered into your electronic medical record.  These signatures attest to the fact that that the information above on your After Visit Summary   has been reviewed and is understood.  Full responsibility of the confidentiality of this discharge information lies with you and/or your care-partner. 

## 2013-05-01 NOTE — Progress Notes (Signed)
Called to room to assist during endoscopic procedure.  Patient ID and intended procedure confirmed with present staff. Received instructions for my participation in the procedure from the performing physician.  

## 2013-05-01 NOTE — Progress Notes (Signed)
A/ox3 pleased with MAC, report to Celia RN 

## 2013-05-01 NOTE — Op Note (Signed)
[place]  [address] [place], [id number]   COLONOSCOPY PROCEDURE REPORT  PATIENT: [name], [name]  MR#: [id number] BIRTHDATE: Apr 02, 1960 , 89  yrs. old GENDER: Male ENDOSCOPIST: Ladene Artist, MD, [facility] PROCEDURE DATE:  05/01/2013 PROCEDURE:   Colonoscopy with snare polypectomy First Screening Colonoscopy - Avg.  risk and is 50 yrs.  old or older Yes.  Prior Negative Screening - Now for repeat screening. N/A  History of Adenoma - Now for follow-up colonoscopy & has been > or = to 3 yrs.  N/A  Polyps Removed Today? Yes. ASA CLASS:   Class II INDICATIONS:average risk screening. MEDICATIONS: MAC sedation, administered by CRNA and propofol (Diprivan) 400mg  IV DESCRIPTION OF PROCEDURE:   After the risks benefits and alternatives of the procedure were thoroughly explained, informed consent was obtained.  A digital rectal exam revealed no abnormalities of the rectum.   The LB [id number]  endoscope was introduced through the anus and advanced to the cecum, which was identified by both the appendix and ileocecal valve. No adverse events experienced.   The quality of the prep was good, using MoviPrep  The instrument was then slowly withdrawn as the colon was fully examined.  COLON FINDINGS: Two sessile polyps measuring 5-6 mm in size were found in the transverse colon.  A polypectomy was performed with a cold snare.  The resection was complete and the polyp tissue was completely retrieved.   Two sessile polyps measuring 5-6 mm in size were found in the descending colon and sigmoid colon.  A polypectomy was performed with a cold snare.  The resection was complete and the polyp tissue was completely retrieved.   The colon was otherwise normal.  There was no diverticulosis, inflammation, polyps or cancers unless previously stated.  Retroflexed views revealed small internal hemorrhoids. The time to cecum=1 minutes 28 seconds.  Withdrawal time=12 minutes 33  seconds.  The scope was withdrawn and the procedure completed.  COMPLICATIONS: There were no complications.  ENDOSCOPIC IMPRESSION: 1.   Two sessile polyps measuring 5-6 mm in the transverse colon; polypectomy performed with a cold snare 2.   Two sessile polyps measuring 5-6 mm in the descending and sigmoid colon; polypectomy performed with a cold snare 3.   Small internal hemorrhoids  RECOMMENDATIONS: 1.  Await pathology results 2.  Repeat colonoscopy in 3 years if 3 or 4 polyps adenomatous; 5 years if 1-2 adenomatous; otherwise 10 years  eSigned:  Ladene Artist, MD, [facility] 05/01/2013 9:56 AM

## 2013-05-02 ENCOUNTER — Telehealth: Payer: Self-pay | Admitting: *Deleted

## 2013-05-02 NOTE — Telephone Encounter (Signed)
Message left

## 2013-05-08 ENCOUNTER — Encounter: Payer: Self-pay | Admitting: Gastroenterology

## 2013-05-28 ENCOUNTER — Other Ambulatory Visit (INDEPENDENT_AMBULATORY_CARE_PROVIDER_SITE_OTHER): Payer: BC Managed Care – PPO

## 2013-05-28 DIAGNOSIS — E785 Hyperlipidemia, unspecified: Secondary | ICD-10-CM

## 2013-05-28 DIAGNOSIS — E119 Type 2 diabetes mellitus without complications: Secondary | ICD-10-CM

## 2013-05-28 LAB — BASIC METABOLIC PANEL
BUN: 10 mg/dL (ref 6–23)
CHLORIDE: 99 meq/L (ref 96–112)
CO2: 28 mEq/L (ref 19–32)
Calcium: 9 mg/dL (ref 8.4–10.5)
Creatinine, Ser: 1 mg/dL (ref 0.4–1.5)
GFR: 80.14 mL/min (ref >60.00)
GLUCOSE: 175 mg/dL — AB (ref 70–99)
POTASSIUM: 4.1 meq/L (ref 3.5–5.1)
SODIUM: 134 meq/L — AB (ref 135–145)

## 2013-05-28 LAB — LIPID PANEL
Cholesterol: 180 mg/dL (ref 0–200)
HDL: 49.1 mg/dL (ref >39.00)
Total CHOL/HDL Ratio: 4
Triglycerides: 317 mg/dL — ABNORMAL HIGH (ref 0.0–149.0)
VLDL: 63.4 mg/dL — ABNORMAL HIGH (ref 0.0–40.0)

## 2013-05-28 LAB — HEMOGLOBIN A1C: Hgb A1c MFr Bld: 7.7 % — ABNORMAL HIGH (ref 4.6–6.5)

## 2013-05-28 LAB — HEPATIC FUNCTION PANEL
ALBUMIN: 3.8 g/dL (ref 3.5–5.2)
ALK PHOS: 55 U/L (ref 39–117)
ALT: 42 U/L (ref 0–53)
AST: 28 U/L (ref 0–37)
BILIRUBIN DIRECT: 0 mg/dL (ref 0.0–0.3)
Total Bilirubin: 0.4 mg/dL (ref 0.3–1.2)
Total Protein: 7.1 g/dL (ref 6.0–8.3)

## 2013-05-28 LAB — LDL CHOLESTEROL, DIRECT: Direct LDL: 87 mg/dL

## 2013-06-04 ENCOUNTER — Ambulatory Visit (INDEPENDENT_AMBULATORY_CARE_PROVIDER_SITE_OTHER): Payer: BC Managed Care – PPO | Admitting: Internal Medicine

## 2013-06-04 ENCOUNTER — Encounter: Payer: Self-pay | Admitting: Internal Medicine

## 2013-06-04 VITALS — BP 124/86 | HR 98 | Temp 98.4°F | Resp 20 | Ht 69.0 in | Wt 289.0 lb

## 2013-06-04 DIAGNOSIS — E119 Type 2 diabetes mellitus without complications: Secondary | ICD-10-CM

## 2013-06-04 DIAGNOSIS — E785 Hyperlipidemia, unspecified: Secondary | ICD-10-CM

## 2013-06-04 NOTE — Patient Instructions (Signed)
The patient is instructed to continue all medications as prescribed. Schedule followup with check out clerk upon leaving the clinic  

## 2013-06-04 NOTE — Progress Notes (Signed)
Pre-visit discussion using our clinic review tool. No additional management support is needed unless otherwise documented below in the visit note.  

## 2013-06-04 NOTE — Progress Notes (Signed)
Subjective:    Patient ID: [name], male    DOB: [birth year], 53 y.o.   MRN: [id number]  HPI Weight increased and the cbgs have been in the 160's Risk is that he is on abilify Mood is stable on the medications with occasional downs Needs A1c to be less than 7 but has improved Improved lipids     Review of Systems  Constitutional: Negative for fever and fatigue.  HENT: Negative for congestion, hearing loss and postnasal drip.   Eyes: Negative for discharge, redness and visual disturbance.  Respiratory: Negative for cough, shortness of breath and wheezing.   Cardiovascular: Negative for leg swelling.  Gastrointestinal: Negative for abdominal pain, constipation and abdominal distention.  Genitourinary: Negative for urgency and frequency.  Musculoskeletal: Negative for arthralgias, joint swelling and neck pain.  Skin: Negative for color change and rash.  Neurological: Negative for weakness and light-headedness.  Hematological: Negative for adenopathy.  Psychiatric/Behavioral: Negative for behavioral problems.   Past Medical History  Diagnosis Date  . GERD (gastroesophageal reflux disease)   . Hyperlipidemia   . Metabolic syndrome   . Diabetes mellitus   . Cancer     melanoma;  right ear  . Hypertension   . Obesity     History   Social History  . Marital Status: Single    Spouse Name: N/A    Number of Children: N/A  . Years of Education: N/A   Occupational History  . Not on file.   Social History Main Topics  . Smoking status: Former Smoker    Quit date: 05/19/1988  . Smokeless tobacco: Never Used     Comment: quit in 1990  . Alcohol Use: Yes     Comment: socially  . Drug Use: No  . Sexual Activity: Yes   Other Topics Concern  . Not on file   Social History Narrative  . No narrative on file    Past Surgical History  Procedure Laterality Date  . Melanoma excision  2008    right ear and neck  . Tonsillectomy  1966    age 54  . Reconstructive  plastic surgery face  1967    dog bite at age 25; about 69 surgeries for 10 years    Family History  Problem Relation Age of Onset  . Arthritis Mother   . Stroke Father   . Colon cancer Neg Hx   . Esophageal cancer Neg Hx   . Rectal cancer Neg Hx   . Stomach cancer Neg Hx     Allergies  Allergen Reactions  . Codeine Nausea And Vomiting  . Food Hives    Coconut flakes    Current Outpatient Prescriptions on File Prior to Visit  Medication Sig Dispense Refill  . amLODipine-benazepril (LOTREL) 5-20 MG per capsule Take 1 capsule by mouth daily.  90 capsule  3  . ARIPiprazole (ABILIFY) 5 MG tablet Take 5 mg by mouth daily.        Marland Kitchen buPROPion (WELLBUTRIN XL) 150 MG 24 hr tablet Take 3 tablets by mouth once daily       . CRESTOR 10 MG tablet TAKE 1 TABLET DAILY  90 tablet  3  . desvenlafaxine (PRISTIQ) 50 MG 24 hr tablet Take 50 mg by mouth daily.        Marland Kitchen glucose blood (FREESTYLE LITE) test strip Use as instructed three times daily       . KOMBIGLYZE XR 07-998 MG TB24 TAKE 1 TABLET DAILY  90  tablet  3  . Liraglutide 18 MG/3ML SOPN Inject 1.8 mg into the skin daily.  9 mL  3  . NOVOLIN 70/30 (70-30) 100 UNIT/ML injection INJECT 25 UNITS UNDER THE SKIN BEFORE BREAKFAST AND DINNER, IF YOU SKIP A MEAL DO NOT TAKE THE INSULIN  12 mL  11  . ondansetron (ZOFRAN) 8 MG tablet Take 1 tablet (8 mg total) by mouth every 8 (eight) hours as needed for nausea.  30 tablet  2  . pantoprazole (PROTONIX) 40 MG tablet TAKE 1 TABLET DAILY  90 tablet  2   No current facility-administered medications on file prior to visit.    BP 124/86  Pulse 98  Temp(Src) 98.4 F (36.9 C) (Oral)  Resp 20  Ht 5\' 9"  (1.753 m)  Wt 289 lb (131.09 kg)  BMI 42.66 kg/m2  SpO2 96%       Objective:   Physical Exam  Nursing note and vitals reviewed. Constitutional: He appears well-developed and well-nourished.  HENT:  Head: Normocephalic and atraumatic.  Eyes: Conjunctivae are normal. Pupils are equal, round, and  reactive to light.  Neck: Normal range of motion. Neck supple.  Cardiovascular: Normal rate and regular rhythm.   Pulmonary/Chest: Effort normal and breath sounds normal.  Abdominal: Soft. Bowel sounds are normal.          Assessment & Plan:  A1c is improved and is now under 8 with a goal of getting the A1c under 7.    Lipids have improved with total lipids and LDL improved.  Renal function is stable with no evidence of renal insufficiency.  Weight loss is the key

## 2013-06-06 ENCOUNTER — Telehealth: Payer: Self-pay

## 2013-06-06 NOTE — Telephone Encounter (Signed)
Relevant patient education assigned to patient using Emmi. ° °

## 2013-08-16 ENCOUNTER — Ambulatory Visit (INDEPENDENT_AMBULATORY_CARE_PROVIDER_SITE_OTHER): Payer: BC Managed Care – PPO | Admitting: Physician Assistant

## 2013-08-16 ENCOUNTER — Encounter: Payer: Self-pay | Admitting: Physician Assistant

## 2013-08-16 VITALS — BP 120/88 | HR 93 | Temp 97.9°F | Resp 16 | Wt 289.0 lb

## 2013-08-16 DIAGNOSIS — H6120 Impacted cerumen, unspecified ear: Secondary | ICD-10-CM

## 2013-08-16 NOTE — Patient Instructions (Addendum)
Over the counter wax softeners are available, and may help in the future to keep wax from getting so clogged in your ears when you start to feel as though they are getting clogged.  Follow up to clinic as needed.   Cerumen Impaction A cerumen impaction is when the wax in your ear forms a plug. This plug usually causes reduced hearing. Sometimes it also causes an earache or dizziness. Removing a cerumen impaction can be difficult and painful. The wax sticks to the ear canal. The canal is sensitive and bleeds easily. If you try to remove a heavy wax buildup with a cotton tipped swab, you may push it in further. Irrigation with water, suction, and small ear curettes may be used to clear out the wax. If the impaction is fixed to the skin in the ear canal, ear drops may be needed for a few days to loosen the wax. People who build up a lot of wax frequently can use ear wax removal products available in your local drugstore. SEEK MEDICAL CARE IF:  You develop an earache, increased hearing loss, or marked dizziness. Document Released: 04/22/2004 Document Revised: 06/07/2011 Document Reviewed: 06/12/2009 [facility] Patient Information 2014 [place], [place].

## 2013-08-16 NOTE — Progress Notes (Signed)
Pre visit review using our clinic review tool, if applicable. No additional management support is needed unless otherwise documented below in the visit note. 

## 2013-08-16 NOTE — Progress Notes (Signed)
Subjective:    Patient ID: [name], male    DOB: 1961/03/26, 53 y.o.   MRN: [id number]  HPI Patient is a 53 year old Caucasian male presenting to the clinic for ears clogged. Patient states that for the last 2 weeks he has been having difficulty hearing, fullness sensation in his ears, and a pronounced ringing in his ears. Patient states that one of his chronic medications causes him to have ringing in his ears regularly, and he is used to this, however over the last 2 weeks this ringing has been gradually increasing in severity, at the same time his hearing seems to be decreasing, and he is developing an increasing fullness sensation in his ears. He denies fevers, chills, nausea, vomiting, diarrhea, shortness of breath, dizziness, vertigo, syncope, and balance issues.   Review of Systems As per the history of present illness and are otherwise negative.   Past Medical History  Diagnosis Date  . GERD (gastroesophageal reflux disease)   . Hyperlipidemia   . Metabolic syndrome   . Diabetes mellitus   . Cancer     melanoma;  right ear  . Hypertension   . Obesity    Past Surgical History  Procedure Laterality Date  . Melanoma excision  2008    right ear and neck  . Tonsillectomy  1966    age 36  . Reconstructive plastic surgery face  1967    dog bite at age 50; about 36 surgeries for 10 years    reports that he quit smoking about 25 years ago. He has never used smokeless tobacco. He reports that he drinks alcohol. He reports that he does not use illicit drugs. family history includes Arthritis in his mother; Stroke in his father. There is no history of Colon cancer, Esophageal cancer, Rectal cancer, or Stomach cancer. Allergies  Allergen Reactions  . Codeine Nausea And Vomiting  . Food Hives    Coconut flakes       Objective:   Physical Exam  Nursing note and vitals reviewed. Constitutional: He is oriented to person, place, and time. He appears well-developed and  well-nourished. No distress.  HENT:  Head: Normocephalic and atraumatic.  Nose: Nose normal.  Mouth/Throat: Oropharynx is clear and moist. No oropharyngeal exudate.  Bilateral ear canals are impacted with cerumen.  TMs appear normal bilaterally after ear lavage.  Eyes: Conjunctivae and EOM are normal. Pupils are equal, round, and reactive to light.  Neck: Normal range of motion. Neck supple.  Cardiovascular: Normal rate, regular rhythm, normal heart sounds and intact distal pulses.  Exam reveals no gallop and no friction rub.   No murmur heard. Pulmonary/Chest: Effort normal and breath sounds normal. No respiratory distress. He has no wheezes. He has no rales. He exhibits no tenderness.  Musculoskeletal: Normal range of motion.  Neurological: He is alert and oriented to person, place, and time.  Skin: Skin is warm and dry. No rash noted. He is not diaphoretic. No erythema. No pallor.  Psychiatric: He has a normal mood and affect. His behavior is normal. Judgment and thought content normal.   Filed Vitals:   08/16/13 0950  BP: 120/88  Pulse: 93  Temp: 97.9 F (36.6 C)  Resp: 16    Lab Results  Component Value Date   WBC 7.3 09/10/2011   HGB 14.6 09/10/2011   HCT 43.8 09/10/2011   PLT 228 09/10/2011   GLUCOSE 175* 05/28/2013   CHOL 180 05/28/2013   TRIG 317.0* 05/28/2013   HDL 49.10  05/28/2013   LDLDIRECT 87.0 05/28/2013   LDLCALC 35 02/04/2011   ALT 42 05/28/2013   AST 28 05/28/2013   NA 134* 05/28/2013   K 4.1 05/28/2013   CL 99 05/28/2013   CREATININE 1.0 05/28/2013   BUN 10 05/28/2013   CO2 28 05/28/2013   TSH 2.05 01/27/2010   PSA 0.46 01/27/2010   HGBA1C 7.7* 05/28/2013   MICROALBUR 3.1* 01/27/2010        Assessment & Plan:  [name] was seen today for ears clogged.  Diagnoses and associated orders for this visit:  Cerumen impaction - Ear Lavage   Plan to follow up to clinic as needed.  Patient Instructions  Over the counter wax softeners are available, and may help in the future to  keep wax from getting so clogged in your ears when you start to feel as though they are getting clogged.  Follow up to clinic as needed.

## 2013-08-17 ENCOUNTER — Other Ambulatory Visit: Payer: Self-pay | Admitting: Internal Medicine

## 2013-10-10 ENCOUNTER — Other Ambulatory Visit: Payer: Self-pay | Admitting: Internal Medicine

## 2013-12-12 ENCOUNTER — Encounter: Payer: Self-pay | Admitting: Gastroenterology

## 2013-12-17 ENCOUNTER — Ambulatory Visit: Payer: BC Managed Care – PPO | Admitting: Internal Medicine

## 2013-12-21 ENCOUNTER — Ambulatory Visit: Payer: BC Managed Care – PPO | Admitting: Physician Assistant

## 2013-12-28 ENCOUNTER — Ambulatory Visit (INDEPENDENT_AMBULATORY_CARE_PROVIDER_SITE_OTHER): Payer: BC Managed Care – PPO | Admitting: Physician Assistant

## 2013-12-28 ENCOUNTER — Encounter: Payer: Self-pay | Admitting: Physician Assistant

## 2013-12-28 VITALS — BP 130/80 | HR 72 | Temp 97.8°F | Resp 18 | Wt 291.8 lb

## 2013-12-28 DIAGNOSIS — F329 Major depressive disorder, single episode, unspecified: Secondary | ICD-10-CM

## 2013-12-28 DIAGNOSIS — I10 Essential (primary) hypertension: Secondary | ICD-10-CM

## 2013-12-28 DIAGNOSIS — E119 Type 2 diabetes mellitus without complications: Secondary | ICD-10-CM

## 2013-12-28 DIAGNOSIS — E785 Hyperlipidemia, unspecified: Secondary | ICD-10-CM

## 2013-12-28 DIAGNOSIS — Z23 Encounter for immunization: Secondary | ICD-10-CM

## 2013-12-28 DIAGNOSIS — F32A Depression, unspecified: Secondary | ICD-10-CM

## 2013-12-28 LAB — BASIC METABOLIC PANEL
BUN: 11 mg/dL (ref 6–23)
CHLORIDE: 100 meq/L (ref 96–112)
CO2: 29 meq/L (ref 19–32)
CREATININE: 1.1 mg/dL (ref 0.4–1.5)
Calcium: 9 mg/dL (ref 8.4–10.5)
GFR: 74.91 mL/min (ref >60.00)
Glucose, Bld: 183 mg/dL — ABNORMAL HIGH (ref 70–99)
Potassium: 4.2 mEq/L (ref 3.5–5.1)
Sodium: 137 mEq/L (ref 135–145)

## 2013-12-28 LAB — MICROALBUMIN / CREATININE URINE RATIO
CREATININE, U: 80.2 mg/dL
Microalb Creat Ratio: 0.2 mg/g (ref 0.0–30.0)
Microalb, Ur: 0.2 mg/dL (ref 0.0–1.9)

## 2013-12-28 LAB — HEMOGLOBIN A1C: Hgb A1c MFr Bld: 7.9 % — ABNORMAL HIGH (ref 4.6–6.5)

## 2013-12-28 NOTE — Progress Notes (Signed)
Subjective:    Patient ID: [name], male    DOB: Apr 14, 1960, 53 y.o.   MRN: [id number]  HPI Patient is a 53 y.o. male presenting for Follow up on DMII, HTN, HLD.  HTN- stable, controlled on amlodipine-benazapril. Pt states that he tolerates this medication well, and denies adverse effects to treatment. Patient denies fevers, chills, nausea, vomiting, diarrhea, shortness of breath, chest pain, headache, syncope.  HLD- controlled with Crestor. Pt states that he tolerates this medication well, and denies adverse effects to treatment.  DMII- Kombiglyze daily, Novolin 70/30 before breakfast and dinner daily, and Victoza daily before breakfast. Pt states that he recently tried to make some changes to his diet to control his sugars. He states that his fasting blood sugar in the mornings range in the 140s to 150s. His A1c on 05/28/2013 was 7.7%. Pt states that he tolerates this medication well, and denies adverse effects to treatment.  Depression- managed by psychiatry, stable on wellbutrin, pristiq and abilify.   Review of Systems As per HPI and are otherwise negative.   Past Medical History  Diagnosis Date  . GERD (gastroesophageal reflux disease)   . Hyperlipidemia   . Metabolic syndrome   . Diabetes mellitus   . Cancer     melanoma;  right ear  . Hypertension   . Obesity     History   Social History  . Marital Status: Single    Spouse Name: N/A    Number of Children: N/A  . Years of Education: N/A   Occupational History  . Not on file.   Social History Main Topics  . Smoking status: Former Smoker    Quit date: 05/19/1988  . Smokeless tobacco: Never Used     Comment: quit in 1990  . Alcohol Use: Yes     Comment: socially  . Drug Use: No  . Sexual Activity: Yes   Other Topics Concern  . Not on file   Social History Narrative  . No narrative on file    Past Surgical History  Procedure Laterality Date  . Melanoma excision  2008    right ear and neck  .  Tonsillectomy  1966    age 33  . Reconstructive plastic surgery face  1967    dog bite at age 15; about 7 surgeries for 10 years    Family History  Problem Relation Age of Onset  . Arthritis Mother   . Stroke Father   . Colon cancer Neg Hx   . Esophageal cancer Neg Hx   . Rectal cancer Neg Hx   . Stomach cancer Neg Hx     Allergies  Allergen Reactions  . Codeine Nausea And Vomiting  . Food Hives    Coconut flakes    Current Outpatient Prescriptions on File Prior to Visit  Medication Sig Dispense Refill  . amLODipine-benazepril (LOTREL) 5-20 MG per capsule TAKE 1 CAPSULE DAILY  90 capsule  2  . ARIPiprazole (ABILIFY) 5 MG tablet Take 5 mg by mouth daily.        Marland Kitchen buPROPion (WELLBUTRIN XL) 150 MG 24 hr tablet Take 3 tablets by mouth once daily       . CRESTOR 10 MG tablet TAKE 1 TABLET DAILY  90 tablet  3  . desvenlafaxine (PRISTIQ) 50 MG 24 hr tablet Take 50 mg by mouth daily.        Marland Kitchen glucose blood (FREESTYLE LITE) test strip Use as instructed three times daily       .  KOMBIGLYZE XR 07-998 MG TB24 TAKE 1 TABLET DAILY  90 tablet  3  . Liraglutide 18 MG/3ML SOPN Inject 1.8 mg into the skin daily.  9 mL  3  . NOVOLIN 70/30 (70-30) 100 UNIT/ML injection INJECT 25 UNITS UNDER THE SKIN BEFORE BREAKFAST AND DINNER, IF YOU SKIP A MEAL DO NOT TAKE THE INSULIN  12 mL  11  . ondansetron (ZOFRAN) 8 MG tablet Take 1 tablet (8 mg total) by mouth every 8 (eight) hours as needed for nausea.  30 tablet  2  . pantoprazole (PROTONIX) 40 MG tablet TAKE 1 TABLET DAILY  90 tablet  1   No current facility-administered medications on file prior to visit.   The PFS history was reviewed at the time of visit.   EXAM: BP 130/80  Pulse 72  Temp(Src) 97.8 F (36.6 C) (Oral)  Resp 18  Wt 291 lb 12.8 oz (132.36 kg)     Objective:   Physical Exam  Nursing note and vitals reviewed. Constitutional: He is oriented to person, place, and time. He appears well-developed and well-nourished. No distress.   HENT:  Head: Normocephalic and atraumatic.  Eyes: Conjunctivae and EOM are normal. Pupils are equal, round, and reactive to light.  Cardiovascular: Normal rate, regular rhythm and intact distal pulses.   Pulmonary/Chest: Effort normal and breath sounds normal. No respiratory distress. He has no wheezes. He has no rales. He exhibits no tenderness.  Musculoskeletal: Normal range of motion. He exhibits no edema.  Neurological: He is alert and oriented to person, place, and time.  Skin: Skin is warm and dry. No rash noted. He is not diaphoretic. No erythema. No pallor.  Psychiatric: He has a normal mood and affect. His behavior is normal. Judgment and thought content normal.     Lab Results  Component Value Date   WBC 7.3 09/10/2011   HGB 14.6 09/10/2011   HCT 43.8 09/10/2011   PLT 228 09/10/2011   GLUCOSE 175* 05/28/2013   CHOL 180 05/28/2013   TRIG 317.0* 05/28/2013   HDL 49.10 05/28/2013   LDLDIRECT 87.0 05/28/2013   LDLCALC 35 02/04/2011   ALT 42 05/28/2013   AST 28 05/28/2013   NA 134* 05/28/2013   K 4.1 05/28/2013   CL 99 05/28/2013   CREATININE 1.0 05/28/2013   BUN 10 05/28/2013   CO2 28 05/28/2013   TSH 2.05 01/27/2010   PSA 0.46 01/27/2010   HGBA1C 7.7* 05/28/2013   MICROALBUR 3.1* 01/27/2010        Assessment & Plan:  Eldridge was seen today for follow-up.  Diagnoses and associated orders for this visit:  Need for prophylactic vaccination and inoculation against influenza - Flu Vaccine QUAD 36+ mos PF IM (Fluarix Quad PF)  Essential hypertension Comments: Controlled on current regimen, will continue. Obtain labs. - Basic Metabolic Panel  Depression Comments: Managed by psychiatry.  Hyperlipidemia Comments: Stable on current regimen, will continue.  Type 2 diabetes mellitus without complication Comments: Stable on current regimen. Will continue. Obtain labs. - Hemoglobin A1c - Microalbumin/Creatinine Ratio, Urine    Return precautions provided, and patient handout on HTN,  DMII.  Plan to follow up in 3 months, or for worsening or persistent symptoms despite treatment.  Patient Instructions  Continue all of your current medications as directed.   We will call you with your lab results when available.  If emergency symptoms discussed during visit developed, seek medical attention immediately.  Followup in 3 months for annual exam, or for worsening or  persistent symptoms despite treatment.

## 2013-12-28 NOTE — Patient Instructions (Addendum)
Continue all of your current medications as directed.   We will call you with your lab results when available.  If emergency symptoms discussed during visit developed, seek medical attention immediately.  Followup in 3 months for annual exam, or for worsening or persistent symptoms despite treatment.   Hypertension Hypertension is another name for high blood pressure. High blood pressure forces your heart to work harder to pump blood. A blood pressure reading has two numbers, which includes a higher number over a lower number (example: 110/72). HOME CARE   Have your blood pressure rechecked by your doctor.  Only take medicine as told by your doctor. Follow the directions carefully. The medicine does not work as well if you skip doses. Skipping doses also puts you at risk for problems.  Do not smoke.  Monitor your blood pressure at home as told by your doctor. GET HELP IF:  You think you are having a reaction to the medicine you are taking.  You have repeat headaches or feel dizzy.  You have puffiness (swelling) in your ankles.  You have trouble with your vision. GET HELP RIGHT AWAY IF:   You get a very bad headache and are confused.  You feel weak, numb, or faint.  You get chest or belly (abdominal) pain.  You throw up (vomit).  You cannot breathe very well. MAKE SURE YOU:   Understand these instructions.  Will watch your condition.  Will get help right away if you are not doing well or get worse. Document Released: 09/01/2007 Document Revised: 03/20/2013 Document Reviewed: 01/05/2013 [facility] Patient Information 2015 [place], [place]. This information is not intended to replace advice given to you by your health care provider. Make sure you discuss any questions you have with your health care provider. Type 2 Diabetes Mellitus Type 2 diabetes mellitus, often simply referred to as type 2 diabetes, is a long-lasting (chronic) disease. In type 2 diabetes, the pancreas  does not make enough insulin (a hormone), the cells are less responsive to the insulin that is made (insulin resistance), or both. Normally, insulin moves sugars from food into the tissue cells. The tissue cells use the sugars for energy. The lack of insulin or the lack of normal response to insulin causes excess sugars to build up in the blood instead of going into the tissue cells. As a result, high blood sugar (hyperglycemia) develops. The effect of high sugar (glucose) levels can cause many complications. Type 2 diabetes was also previously called adult-onset diabetes, but it can occur at any age.  RISK FACTORS  A person is predisposed to developing type 2 diabetes if someone in the family has the disease and also has one or more of the following primary risk factors:  Overweight.  An inactive lifestyle.  A history of consistently eating high-calorie foods. Maintaining a normal weight and regular physical activity can reduce the chance of developing type 2 diabetes. SYMPTOMS  A person with type 2 diabetes may not show symptoms initially. The symptoms of type 2 diabetes appear slowly. The symptoms include:  Increased thirst (polydipsia).  Increased urination (polyuria).  Increased urination during the night (nocturia).  Weight loss. This weight loss may be rapid.  Frequent, recurring infections.  Tiredness (fatigue).  Weakness.  Vision changes, such as blurred vision.  Fruity smell to your breath.  Abdominal pain.  Nausea or vomiting.  Cuts or bruises which are slow to heal.  Tingling or numbness in the hands or feet. DIAGNOSIS Type 2 diabetes is frequently  not diagnosed until complications of diabetes are present. Type 2 diabetes is diagnosed when symptoms or complications are present and when blood glucose levels are increased. Your blood glucose level may be checked by one or more of the following blood tests:  A fasting blood glucose test. You will not be allowed  to eat for at least 8 hours before a blood sample is taken.  A random blood glucose test. Your blood glucose is checked at any time of the day regardless of when you ate.  A hemoglobin A1c blood glucose test. A hemoglobin A1c test provides information about blood glucose control over the previous 3 months.  An oral glucose tolerance test (OGTT). Your blood glucose is measured after you have not eaten (fasted) for 2 hours and then after you drink a glucose-containing beverage. TREATMENT   You may need to take insulin or diabetes medicine daily to keep blood glucose levels in the desired range.  If you use insulin, you may need to adjust the dosage depending on the carbohydrates that you eat with each meal or snack. The treatment goal is to maintain the before meal blood sugar (preprandial glucose) level at 70-130 mg/dL. HOME CARE INSTRUCTIONS   Have your hemoglobin A1c level checked twice a year.  Perform daily blood glucose monitoring as directed by your health care provider.  Monitor urine ketones when you are ill and as directed by your health care provider.  Take your diabetes medicine or insulin as directed by your health care provider to maintain your blood glucose levels in the desired range.  Never run out of diabetes medicine or insulin. It is needed every day.  If you are using insulin, you may need to adjust the amount of insulin given based on your intake of carbohydrates. Carbohydrates can raise blood glucose levels but need to be included in your diet. Carbohydrates provide vitamins, minerals, and fiber which are an essential part of a healthy diet. Carbohydrates are found in fruits, vegetables, whole grains, dairy products, legumes, and foods containing added sugars.  Eat healthy foods. You should make an appointment to see a registered dietitian to help you create an eating plan that is right for you.  Lose weight if you are overweight.  Carry a medical alert card or  wear your medical alert jewelry.  Carry a 15-gram carbohydrate snack with you at all times to treat low blood glucose (hypoglycemia). Some examples of 15-gram carbohydrate snacks include:  Glucose tablets, 3 or 4.  Glucose gel, 15-gram tube.  Raisins, 2 tablespoons (24 grams).  Jelly beans, 6.  Animal crackers, 8.  Regular pop, 4 ounces (120 mL).  Gummy treats, 9.  Recognize hypoglycemia. Hypoglycemia occurs with blood glucose levels of 70 mg/dL and below. The risk for hypoglycemia increases when fasting or skipping meals, during or after intense exercise, and during sleep. Hypoglycemia symptoms can include:  Tremors or shakes.  Decreased ability to concentrate.  Sweating.  Increased heart rate.  Headache.  Dry mouth.  Hunger.  Irritability.  Anxiety.  Restless sleep.  Altered speech or coordination.  Confusion.  Treat hypoglycemia promptly. If you are alert and able to safely swallow, follow the 15:15 rule:  Take 15-20 grams of rapid-acting glucose or carbohydrate. Rapid-acting options include glucose gel, glucose tablets, or 4 ounces (120 mL) of fruit juice, regular soda, or low-fat milk.  Check your blood glucose level 15 minutes after taking the glucose.  Take 15-20 grams more of glucose if the repeat blood glucose level  is still 70 mg/dL or below.  Eat a meal or snack within 1 hour once blood glucose levels return to normal.  Be alert to feeling very thirsty and urinating more frequently than usual, which are early signs of hyperglycemia. An early awareness of hyperglycemia allows for prompt treatment. Treat hyperglycemia as directed by your health care provider.  Engage in at least 150 minutes of moderate-intensity physical activity a week, spread over at least 3 days of the week or as directed by your health care provider. In addition, you should engage in resistance exercise at least 2 times a week or as directed by your health care provider. Try to  spend no more than 90 minutes at one time inactive.  Adjust your medicine and food intake as needed if you start a new exercise or sport.  Follow your sick-day plan anytime you are unable to eat or drink as usual.  Do not use any tobacco products including cigarettes, chewing tobacco, or electronic cigarettes. If you need help quitting, ask your health care provider.  Limit alcohol intake to no more than 1 drink per day for nonpregnant women and 2 drinks per day for men. You should drink alcohol only when you are also eating food. Talk with your health care provider whether alcohol is safe for you. Tell your health care provider if you drink alcohol several times a week.  Keep all follow-up visits as directed by your health care provider. This is important.  Schedule an eye exam soon after the diagnosis of type 2 diabetes and then annually.  Perform daily skin and foot care. Examine your skin and feet daily for cuts, bruises, redness, nail problems, bleeding, blisters, or sores. A foot exam by a health care provider should be done annually.  Brush your teeth and gums at least twice a day and floss at least once a day. Follow up with your dentist regularly.  Share your diabetes management plan with your workplace or school.  Stay up-to-date with immunizations. It is recommended that people with diabetes who are over 56 years old get the pneumonia vaccine. In some cases, two separate shots may be given. Ask your health care provider if your pneumonia vaccination is up-to-date.  Learn to manage stress.  Obtain ongoing diabetes education and support as needed.  Participate in or seek rehabilitation as needed to maintain or improve independence and quality of life. Request a physical or occupational therapy referral if you are having foot or hand numbness, or difficulties with grooming, dressing, eating, or physical activity. SEEK MEDICAL CARE IF:   You are unable to eat food or drink fluids  for more than 6 hours.  You have nausea and vomiting for more than 6 hours.  Your blood glucose level is over 240 mg/dL.  There is a change in mental status.  You develop an additional serious illness.  You have diarrhea for more than 6 hours.  You have been sick or have had a fever for a couple of days and are not getting better.  You have pain during any physical activity.  SEEK IMMEDIATE MEDICAL CARE IF:  You have difficulty breathing.  You have moderate to large ketone levels. MAKE SURE YOU:  Understand these instructions.  Will watch your condition.  Will get help right away if you are not doing well or get worse. Document Released: 03/15/2005 Document Revised: 07/30/2013 Document Reviewed: 10/12/2011 [facility] Patient Information 2015 [place], [place]. This information is not intended to replace advice given to  to you by your health care provider. Make sure you discuss any questions you have with your health care provider.  

## 2013-12-28 NOTE — Progress Notes (Signed)
Pre visit review using our clinic review tool, if applicable. No additional management support is needed unless otherwise documented below in the visit note. 

## 2013-12-31 ENCOUNTER — Telehealth: Payer: Self-pay | Admitting: Internal Medicine

## 2013-12-31 ENCOUNTER — Telehealth: Payer: Self-pay | Admitting: Physician Assistant

## 2013-12-31 NOTE — Telephone Encounter (Signed)
Left HIPAA compliant message for return call.

## 2013-12-31 NOTE — Telephone Encounter (Signed)
emmi emailed °

## 2014-01-03 NOTE — Telephone Encounter (Signed)
Left a message for return call.  

## 2014-01-08 NOTE — Telephone Encounter (Signed)
Called and spoke with pt and pt is aware of Matthew's recommendations.  Pt will call back tonight after work and leave his blood sugar readings on my vm.

## 2014-01-10 ENCOUNTER — Telehealth: Payer: Self-pay | Admitting: Physician Assistant

## 2014-01-10 NOTE — Telephone Encounter (Signed)
Erroneous Encounter - Disregard

## 2014-01-11 NOTE — Telephone Encounter (Signed)
Basically, we will most likely have him increase his morning insulin dose to 30 units (leave the PM dose at 25 units). Have him schedule a follow up with Dr. Yong Channel for about 2 months.

## 2014-01-14 NOTE — Telephone Encounter (Signed)
Called and spoke with pt and pt is aware.  

## 2014-02-04 ENCOUNTER — Telehealth: Payer: Self-pay | Admitting: Internal Medicine

## 2014-02-04 NOTE — Telephone Encounter (Signed)
[facility] [place] [place] is requesting re-fills on the following NOVOLIN 70/30 (70-30) 100 UNIT/ML injection, CRESTOR 10 MG tablet, and KOMBIGLYZE XR 07-998 MG TB24

## 2014-02-05 MED ORDER — ROSUVASTATIN CALCIUM 10 MG PO TABS: ORAL_TABLET | ORAL | Status: DC

## 2014-02-05 MED ORDER — SAXAGLIPTIN-METFORMIN ER 5-1000 MG PO TB24: ORAL_TABLET | ORAL | Status: DC

## 2014-02-05 MED ORDER — INSULIN NPH ISOPHANE & REGULAR (70-30) 100 UNIT/ML ~~LOC~~ SUSP: SUBCUTANEOUS | Status: DC

## 2014-02-05 NOTE — Telephone Encounter (Signed)
Pt has an appt with Dr Yong Channel to establish in February.  Refills sent in electronically

## 2014-04-03 ENCOUNTER — Telehealth: Payer: Self-pay

## 2014-04-03 DIAGNOSIS — E119 Type 2 diabetes mellitus without complications: Secondary | ICD-10-CM

## 2014-04-03 MED ORDER — AMLODIPINE BESY-BENAZEPRIL HCL 5-20 MG PO CAPS: 1.0000 | ORAL_CAPSULE | Freq: Every day | ORAL | Status: DC

## 2014-04-03 MED ORDER — PANTOPRAZOLE SODIUM 40 MG PO TBEC: 40.0000 mg | DELAYED_RELEASE_TABLET | Freq: Every day | ORAL | Status: DC

## 2014-04-03 MED ORDER — LIRAGLUTIDE 18 MG/3ML ~~LOC~~ SOPN: 1.8000 mg | PEN_INJECTOR | Freq: Every day | SUBCUTANEOUS | Status: DC

## 2014-04-03 NOTE — Telephone Encounter (Signed)
Rx request for pantoprazole sodium 40 mg- take 1 tablet daily #90  Rx request for Victoza pen injector 3x3 ml 6 mg/ml qty:3- Inject 1.8 ng under the skin daily

## 2014-04-03 NOTE — Telephone Encounter (Signed)
Rx request for amlodipine/benazepril 5/20 mg #90  Pharm:  [facility]  Pt will establish care in Feb 2016.  Pls advise.

## 2014-04-03 NOTE — Telephone Encounter (Signed)
Medications refilled

## 2014-05-09 ENCOUNTER — Other Ambulatory Visit: Payer: Self-pay

## 2014-05-09 MED ORDER — ROSUVASTATIN CALCIUM 10 MG PO TABS: ORAL_TABLET | ORAL | Status: DC

## 2014-05-09 MED ORDER — SAXAGLIPTIN-METFORMIN ER 5-1000 MG PO TB24: ORAL_TABLET | ORAL | Status: DC

## 2014-05-09 NOTE — Telephone Encounter (Signed)
Rx request for:  crestor 10 mg tablet-Take 1 tablet by mouth daily #90  Kombiglyze XR tablet 07/998- Take 1 tablet daily #90  Pharm:  [facility].  Pt has an upcoming transfer pt with Dr. Yong Channel on 2.22.2016

## 2014-05-20 ENCOUNTER — Ambulatory Visit: Payer: Self-pay | Admitting: Family Medicine

## 2014-07-29 ENCOUNTER — Other Ambulatory Visit: Payer: Self-pay | Admitting: Family Medicine

## 2014-09-05 ENCOUNTER — Ambulatory Visit: Payer: Self-pay | Admitting: Family Medicine

## 2014-09-12 ENCOUNTER — Other Ambulatory Visit: Payer: Self-pay | Admitting: Family Medicine

## 2014-09-24 ENCOUNTER — Ambulatory Visit: Payer: BLUE CROSS/BLUE SHIELD | Admitting: Family Medicine

## 2014-10-07 ENCOUNTER — Ambulatory Visit: Payer: BLUE CROSS/BLUE SHIELD | Admitting: Family Medicine

## 2015-02-14 ENCOUNTER — Telehealth: Payer: Self-pay | Admitting: Family Medicine

## 2015-02-14 NOTE — Telephone Encounter (Signed)
Pt was unable to kept the schedule appointment w/hunter due to travelling. Can I create 30 min slot. Pt is transfer from dr Arnoldo Morale

## 2015-02-14 NOTE — Telephone Encounter (Signed)
lmom for pt to call back

## 2015-02-14 NOTE — Telephone Encounter (Signed)
See below

## 2015-02-14 NOTE — Telephone Encounter (Signed)
He can have a 15 minute slot to refill meds but needs to be set up with next available 30 minute slot at 8:15, 8:45, 1:15, 1:45. Please make sure he is aware may take multiple visits if he has concerns other than refills or if things are discovered on labs

## 2015-02-18 ENCOUNTER — Other Ambulatory Visit: Payer: Self-pay | Admitting: Family Medicine

## 2015-02-18 MED ORDER — INSULIN NPH ISOPHANE & REGULAR (70-30) 100 UNIT/ML ~~LOC~~ SUSP: SUBCUTANEOUS | Status: DC

## 2015-02-18 NOTE — Telephone Encounter (Signed)
Pt has been sch

## 2015-03-04 ENCOUNTER — Encounter: Payer: Self-pay | Admitting: Family Medicine

## 2015-03-04 ENCOUNTER — Ambulatory Visit (INDEPENDENT_AMBULATORY_CARE_PROVIDER_SITE_OTHER): Payer: BLUE CROSS/BLUE SHIELD | Admitting: Family Medicine

## 2015-03-04 VITALS — BP 140/78 | Temp 98.0°F | Wt 282.0 lb

## 2015-03-04 DIAGNOSIS — Z20828 Contact with and (suspected) exposure to other viral communicable diseases: Secondary | ICD-10-CM | POA: Diagnosis not present

## 2015-03-04 DIAGNOSIS — E1159 Type 2 diabetes mellitus with other circulatory complications: Secondary | ICD-10-CM | POA: Insufficient documentation

## 2015-03-04 DIAGNOSIS — E785 Hyperlipidemia, unspecified: Secondary | ICD-10-CM

## 2015-03-04 DIAGNOSIS — E119 Type 2 diabetes mellitus without complications: Secondary | ICD-10-CM | POA: Diagnosis not present

## 2015-03-04 DIAGNOSIS — K219 Gastro-esophageal reflux disease without esophagitis: Secondary | ICD-10-CM | POA: Insufficient documentation

## 2015-03-04 DIAGNOSIS — I10 Essential (primary) hypertension: Secondary | ICD-10-CM

## 2015-03-04 DIAGNOSIS — Z23 Encounter for immunization: Secondary | ICD-10-CM

## 2015-03-04 NOTE — Assessment & Plan Note (Signed)
S: suspect mildly poorly controlled. On Novolin 70/30 NPH/Regular 30 units BID, victoza 1.8mg  daily, Kombiglyze XR 07/998: CBGs-  AM before breakfast: usually around 150 Before lunch: Usually 140 Before dinner: usually 140 Exercise and diet- has lost 9 lbs. Just started back exercising and is going to do weight loss program through work  Lab Results  Component Value Date   HGBA1C 7.9* 12/28/2013   HGBA1C 7.7* 05/28/2013   HGBA1C 8.0* 01/05/2013   A/P: check a1c within week, suspect we will titrate insulin up to 31-35 BID in coming months from 30 BID. Continue other medications though sulfonylurea- could consider stopping.

## 2015-03-04 NOTE — Patient Instructions (Addendum)
Flu shot and Pneumovax received today.  Get eye exam scheduled and have results faxed to [place] at [phone].  Blood pressure hair high- let's keep follow up in a month for recheck and to continue to go through history (we were able to achieve a lot today though so thank you!)  Return within a week for fasting labs- please schedule visit at front desk.

## 2015-03-04 NOTE — Progress Notes (Signed)
Garret Reddish, MD  Subjective:  [name] is a 54 y.o. year old very pleasant male patient who presents for/with See problem oriented charting ROS- No chest pain or shortness of breath. No headache or blurry vision. Denies hypoglycemia episodes.   Past Medical History-  Patient Active Problem List   Diagnosis Date Noted  . Morbid obesity ([place]) 12/13/2008    Priority: High  . Type 2 diabetes mellitus ([place]) 03/05/2008    Priority: High  . MELANOMA, MALIGNANT, EAR 12/16/2006    Priority: High  . Essential hypertension 03/04/2015    Priority: Medium  . Depression 12/16/2006    Priority: Medium  . Hyperlipidemia 11/07/2006    Priority: Medium  . GERD (gastroesophageal reflux disease) 03/04/2015    Priority: Low    Medications- reviewed and updated Current Outpatient Prescriptions  Medication Sig Dispense Refill  . amLODipine-benazepril (LOTREL) 5-20 MG per capsule Take 1 capsule by mouth daily. 90 capsule 2  . ARIPiprazole (ABILIFY) 5 MG tablet Take 5 mg by mouth daily.      Marland Kitchen buPROPion (WELLBUTRIN XL) 150 MG 24 hr tablet Take 3 tablets by mouth once daily     . CRESTOR 10 MG tablet TAKE 1 TABLET DAILY 90 tablet 1  . desvenlafaxine (PRISTIQ) 50 MG 24 hr tablet Take 50 mg by mouth daily.      Marland Kitchen glucose blood (FREESTYLE LITE) test strip Use as instructed three times daily     . insulin NPH-regular Human (NOVOLIN 70/30) (70-30) 100 UNIT/ML injection INJECT 25 UNITS UNDER THE SKIN BEFORE BREAKFAST AND DINNER, IF YOU SKIP A MEAL DO NOT TAKE THE INSULIN 12 mL 0  . KOMBIGLYZE XR 07-998 MG TB24 TAKE 1 TABLET DAILY 90 tablet 1  . pantoprazole (PROTONIX) 40 MG tablet Take 1 tablet (40 mg total) by mouth daily. 90 tablet 2  . VICTOZA 18 MG/3ML SOPN INJECT 1.8 MG UNDER THE SKIN DAILY 9 mL 2   No current facility-administered medications for this visit.    Objective: BP 140/78 mmHg  Temp(Src) 98 F (36.7 C)  Wt 282 lb (127.914 kg) Gen: NAD, resting comfortably CV: RRR no murmurs  rubs or gallops Lungs: CTAB no crackles, wheeze, rhonchi Abdomen: soft/nontender/nondistended/normal bowel sounds. No rebound or guarding.  obese Ext: no edema Skin: warm, dry. Scars from prior surgery on right face after dog bite years ago as well as melanoma surgery Neuro: grossly normal, moves all extremities  Assessment/Plan:  Type 2 diabetes mellitus ([place]) S: suspect mildly poorly controlled. On Novolin 70/30 NPH/Regular 30 units BID, victoza 1.8mg  daily, Kombiglyze XR 07/998: CBGs-  AM before breakfast: usually around 150 Before lunch: Usually 140 Before dinner: usually 140 Exercise and diet- has lost 9 lbs. Just started back exercising and is going to do weight loss program through work  Lab Results  Component Value Date   HGBA1C 7.9* 12/28/2013   HGBA1C 7.7* 05/28/2013   HGBA1C 8.0* 01/05/2013   A/P: check a1c within week, suspect we will titrate insulin up to 31-35 BID in coming months from 30 BID. Continue other medications though sulfonylurea- could consider stopping.       Essential hypertension S: poorlycontrolled. On  Amlodipine-benazepril 5-20mg .  BP Readings from Last 3 Encounters:  03/04/15 140/78  12/28/13 130/80  08/16/13 120/88  A/P:Continue current meds:  Previously well controlled and down 9 lbs. Could be some anxiety from meeting new provider. Will recheck 1 month.    Hyperlipidemia S: well controlled on crestor 10mg  on last check.  No myalgias.  Lab Results  Component Value Date   CHOL 180 05/28/2013   HDL 49.10 05/28/2013   LDLCALC 35 02/04/2011   LDLDIRECT 87.0 05/28/2013   TRIG 317.0* 05/28/2013   CHOLHDL 4 05/28/2013   A/P: check lipids within next week. Hopeful triglycerides can be redcued with wt loss    Morbid obesity ([place]) S: has lost some weight with exercising recently and trying to control portion size but knows needs to lose more. Has upcoming Program at work- HMR for weight loss and gets a discount. Meal plan and coaching. A/P:  hopeful work program benefits patient and able to titrate medicines down with weight loss. Encouraged need for healthy eating, regular exercise, weight loss.    1 month BP recheck and establish visit Return precautions advised.   Future fasting Orders Placed This Encounter  Procedures  . Pneumococcal polysaccharide vaccine 23-valent greater than or equal to 2yo subcutaneous/IM  . Flu Vaccine QUAD 36+ mos IM  . Hemoglobin A1c  . CBC    [facility]    Standing Status: Future     Number of Occurrences:      Standing Expiration Date: 03/03/2016  . Comprehensive metabolic panel    [place]    Standing Status: Future     Number of Occurrences:      Standing Expiration Date: 03/03/2016  . Hemoglobin A1c        Standing Status: Future     Number of Occurrences:      Standing Expiration Date: 03/03/2016  . Hepatitis C antibody, reflex    [facility]    Standing Status: Future     Number of Occurrences:      Standing Expiration Date: 03/03/2016  . HIV antibody    Standing Status: Future     Number of Occurrences:      Standing Expiration Date: 03/03/2016   Health Maintenance Due  Topic Date Due  . Hepatitis C Screening - with labs 1960/07/11  . HIV Screening - with labs 12/01/1974  . OPHTHALMOLOGY EXAM - encouraged to schedule 08/19/2010  . FOOT EXAM - next visit 02/04/2011  . HEMOGLOBIN A1C - with labs 06/29/2014

## 2015-03-04 NOTE — Assessment & Plan Note (Signed)
S: well controlled on crestor 10mg  on last check. No myalgias.  Lab Results  Component Value Date   CHOL 180 05/28/2013   HDL 49.10 05/28/2013   LDLCALC 35 02/04/2011   LDLDIRECT 87.0 05/28/2013   TRIG 317.0* 05/28/2013   CHOLHDL 4 05/28/2013   A/P: check lipids within next week. Hopeful triglycerides can be redcued with wt loss

## 2015-03-04 NOTE — Assessment & Plan Note (Signed)
S: has lost some weight with exercising recently and trying to control portion size but knows needs to lose more. Has upcoming Program at work- HMR for weight loss and gets a discount. Meal plan and coaching. A/P: hopeful work program benefits patient and able to titrate medicines down with weight loss. Encouraged need for healthy eating, regular exercise, weight loss.

## 2015-03-04 NOTE — Assessment & Plan Note (Signed)
S: poorlycontrolled. On  Amlodipine-benazepril 5-20mg .  BP Readings from Last 3 Encounters:  03/04/15 140/78  12/28/13 130/80  08/16/13 120/88  A/P:Continue current meds:  Previously well controlled and down 9 lbs. Could be some anxiety from meeting new provider. Will recheck 1 month.

## 2015-03-13 ENCOUNTER — Other Ambulatory Visit (INDEPENDENT_AMBULATORY_CARE_PROVIDER_SITE_OTHER): Payer: BLUE CROSS/BLUE SHIELD

## 2015-03-13 ENCOUNTER — Other Ambulatory Visit: Payer: Self-pay | Admitting: Family Medicine

## 2015-03-13 DIAGNOSIS — E119 Type 2 diabetes mellitus without complications: Secondary | ICD-10-CM | POA: Diagnosis not present

## 2015-03-13 DIAGNOSIS — Z20828 Contact with and (suspected) exposure to other viral communicable diseases: Secondary | ICD-10-CM

## 2015-03-13 LAB — COMPREHENSIVE METABOLIC PANEL
ALT: 37 U/L (ref 0–53)
AST: 26 U/L (ref 0–37)
Albumin: 4.2 g/dL (ref 3.5–5.2)
Alkaline Phosphatase: 66 U/L (ref 39–117)
BUN: 12 mg/dL (ref 6–23)
CALCIUM: 9.4 mg/dL (ref 8.4–10.5)
CHLORIDE: 98 meq/L (ref 96–112)
CO2: 32 meq/L (ref 19–32)
CREATININE: 1.17 mg/dL (ref 0.40–1.50)
GFR: 68.72 mL/min (ref >60.00)
GLUCOSE: 94 mg/dL (ref 70–99)
Potassium: 4.8 mEq/L (ref 3.5–5.1)
SODIUM: 137 meq/L (ref 135–145)
Total Bilirubin: 0.4 mg/dL (ref 0.2–1.2)
Total Protein: 7.3 g/dL (ref 6.0–8.3)

## 2015-03-13 LAB — HEMOGLOBIN A1C: Hgb A1c MFr Bld: 6.6 % — ABNORMAL HIGH (ref 4.6–6.5)

## 2015-03-13 LAB — CBC
HCT: 43.4 % (ref 39.0–52.0)
Hemoglobin: 14.4 g/dL (ref 13.0–17.0)
MCHC: 33.1 g/dL (ref 30.0–36.0)
MCV: 84.4 fl (ref 78.0–100.0)
PLATELETS: 281 10*3/uL (ref 150.0–400.0)
RBC: 5.14 Mil/uL (ref 4.22–5.81)
RDW: 13.4 % (ref 11.5–15.5)
WBC: 9.6 10*3/uL (ref 4.0–10.5)

## 2015-03-14 LAB — HEPATITIS C ANTIBODY: HCV Ab: NEGATIVE

## 2015-03-14 LAB — HIV ANTIBODY (ROUTINE TESTING W REFLEX): HIV 1&2 Ab, 4th Generation: NONREACTIVE

## 2015-04-03 ENCOUNTER — Ambulatory Visit (INDEPENDENT_AMBULATORY_CARE_PROVIDER_SITE_OTHER): Payer: BLUE CROSS/BLUE SHIELD | Admitting: Family Medicine

## 2015-04-03 ENCOUNTER — Encounter: Payer: Self-pay | Admitting: Family Medicine

## 2015-04-03 VITALS — BP 140/90 | HR 85 | Temp 98.3°F | Wt 280.0 lb

## 2015-04-03 DIAGNOSIS — E119 Type 2 diabetes mellitus without complications: Secondary | ICD-10-CM | POA: Diagnosis not present

## 2015-04-03 DIAGNOSIS — I10 Essential (primary) hypertension: Secondary | ICD-10-CM | POA: Diagnosis not present

## 2015-04-03 DIAGNOSIS — Z794 Long term (current) use of insulin: Secondary | ICD-10-CM

## 2015-04-03 MED ORDER — INSULIN NPH ISOPHANE & REGULAR (70-30) 100 UNIT/ML ~~LOC~~ SUSP: SUBCUTANEOUS | Status: DC

## 2015-04-03 MED ORDER — PANTOPRAZOLE SODIUM 40 MG PO TBEC: 40.0000 mg | DELAYED_RELEASE_TABLET | Freq: Every day | ORAL | Status: DC

## 2015-04-03 MED ORDER — LIRAGLUTIDE 18 MG/3ML ~~LOC~~ SOPN: PEN_INJECTOR | SUBCUTANEOUS | Status: DC

## 2015-04-03 MED ORDER — SAXAGLIPTIN-METFORMIN ER 5-1000 MG PO TB24: 1.0000 | ORAL_TABLET | Freq: Every day | ORAL | Status: DC

## 2015-04-03 MED ORDER — AMLODIPINE BESY-BENAZEPRIL HCL 5-20 MG PO CAPS: 1.0000 | ORAL_CAPSULE | Freq: Every day | ORAL | Status: DC

## 2015-04-03 MED ORDER — ROSUVASTATIN CALCIUM 10 MG PO TABS: 10.0000 mg | ORAL_TABLET | Freq: Every day | ORAL | Status: DC

## 2015-04-03 NOTE — Progress Notes (Signed)
Dustin Reddish, MD Phone: [phone]  Subjective:  Patient presents today to establish care with me as their new primary care provider. Patient was formerly a patient of Dr. Arnoldo Morale. Chief complaint-noted.   See problem oriented charting ROS- no hypoglycemia or blurry vision. No chest pain or shortness of breath. No headache or blurry vision.   The following were reviewed and entered/updated in epic: Past Medical History  Diagnosis Date  . GERD (gastroesophageal reflux disease)   . Hyperlipidemia   . Metabolic syndrome   . Diabetes mellitus   . Cancer ([place])     melanoma;  right ear  . Hypertension   . Obesity    Patient Active Problem List   Diagnosis Date Noted  . Morbid obesity ([place]) 12/13/2008    Priority: High  . Type 2 diabetes mellitus ([place]) 03/05/2008    Priority: High  . MELANOMA, MALIGNANT, EAR 12/16/2006    Priority: High  . Essential hypertension 03/04/2015    Priority: Medium  . Depression 12/16/2006    Priority: Medium  . Hyperlipidemia 11/07/2006    Priority: Medium  . GERD (gastroesophageal reflux disease) 03/04/2015    Priority: Low   Past Surgical History  Procedure Laterality Date  . Melanoma excision  2008    right ear and neck  . Tonsillectomy  1966    age 55  . Reconstructive plastic surgery face  1967    dog bite at age 55; about 76 surgeries for 10 years  . Cyst removal trunk  1989    benign    Family History  Problem Relation Age of Onset  . Arthritis Mother   . Stroke Father 78    heavy smoker 4-5 packs a day unfiltered  . Colon cancer Neg Hx   . Esophageal cancer Neg Hx   . Rectal cancer Neg Hx   . Stomach cancer Neg Hx   . Schizophrenia Father     homeless and did not get medical care in time  . Alcoholism Father     Medications- reviewed and updated Current Outpatient Prescriptions  Medication Sig Dispense Refill  . amLODipine-benazepril (LOTREL) 5-20 MG capsule Take 1 capsule by mouth daily. 90 capsule 3  .  ARIPiprazole (ABILIFY) 5 MG tablet Take 5 mg by mouth daily.      Marland Kitchen buPROPion (WELLBUTRIN XL) 150 MG 24 hr tablet Take 3 tablets by mouth once daily     . desvenlafaxine (PRISTIQ) 50 MG 24 hr tablet Take 50 mg by mouth daily.      Marland Kitchen glucose blood (FREESTYLE LITE) test strip Use as instructed three times daily     . insulin NPH-regular Human (NOVOLIN 70/30) (70-30) 100 UNIT/ML injection INJECT 30 UNITS UNDER THE SKIN BEFORE BREAKFAST AND DINNER, IF YOU SKIP A MEAL DO NOT TAKE THE INSULIN 60 mL 3  . Liraglutide (VICTOZA) 18 MG/3ML SOPN INJECT 1.8 MG UNDER THE SKIN DAILY 27 mL 3  . pantoprazole (PROTONIX) 40 MG tablet Take 1 tablet (40 mg total) by mouth daily. 90 tablet 3  . rosuvastatin (CRESTOR) 10 MG tablet Take 1 tablet (10 mg total) by mouth daily. 90 tablet 3  . Saxagliptin-Metformin (KOMBIGLYZE XR) 07-998 MG TB24 Take 1 tablet by mouth daily. 90 tablet 3   No current facility-administered medications for this visit.    Allergies-reviewed and updated Allergies  Allergen Reactions  . Codeine Nausea And Vomiting  . Food Hives    Coconut flakes    Social History   Social History  .  Marital Status: Single    Spouse Name: N/A  . Number of Children: N/A  . Years of Education: N/A   Social History Main Topics  . Smoking status: Former Smoker -- 0.50 packs/day for 5 years    Types: Cigarettes    Quit date: 05/19/1988  . Smokeless tobacco: Never Used     Comment: quit in 1990  . Alcohol Use: 0.0 oz/week    0 Standard drinks or equivalent per week     Comment: socially every other week 2 beers  . Drug Use: No  . Sexual Activity: No   Other Topics Concern  . Not on file   Social History Narrative   Single. 1 dog, 2 cats. Lives alone.       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 Enjoys dinner parties.     ROS--See HPI   Objective: BP  140/90 mmHg  Pulse 85  Temp(Src) 98.3 F (36.8 C)  Wt 280 lb (127.007 kg) Gen: NAD, resting comfortably HEENT: Mucous membranes are moist. Oropharynx normal CV: RRR no murmurs rubs or gallops Lungs: CTAB no crackles, wheeze, rhonchi Abdomen: soft/nontender/nondistended/normal bowel sounds. No rebound or guarding.  Ext: no edema Skin: warm, dry, no rash. Prior scars noted Neuro: grossly normal, moves all extremities, PERRLA  Diabetic Foot Exam - Simple   Simple Foot Form  Diabetic Foot exam was performed with the following findings:  Yes 04/03/2015  2:17 PM  Visual Inspection  No deformities, no ulcerations, no other skin breakdown bilaterally:  Yes  Sensation Testing  Intact to touch and monofilament testing bilaterally:  Yes  Pulse Check  Posterior Tibialis and Dorsalis pulse intact bilaterally:  Yes  Comments     Assessment/Plan:   Essential hypertension S: poorly controlled but ran out of medicine 2.5 weeks ago. Has had issues as his mail order pharmacy has changed and having issues getting rx transferred BP Readings from Last 3 Encounters:  04/03/15 140/90  03/04/15 140/78  12/28/13 130/80  A/P:Continue current meds:  Refilled today- suspect BP would be controlled back on medicine. We sent in rx to future scripts but he will let us know if any issues. He will do home monitoring and if above 140/90 on meds see [place] sooner but otherwise see in 4 months.     Type 2 diabetes mellitus ([facility]) S: well controlled onNovolin 70/30 NPH/Regular 30 units BID, victoza 1.8mg  daily, Kombiglyze XR 07/998 with no lows Lab Results  Component Value Date   HGBA1C 6.6* 03/13/2015  A/P: weight also down 2 lbs and patient working to get this down further. He will follow up in 4 months with repeat a1c and also at least get direct LDL at that time to monitor lipids. Foot exam today and advised eye exam- he has scheduled.    4 months Return precautions advised.   Meds ordered this encounter    Medications  . amLODipine-benazepril (LOTREL) 5-20 MG capsule    Sig: Take 1 capsule by mouth daily.    Dispense:  90 capsule    Refill:  3  . pantoprazole (PROTONIX) 40 MG tablet    Sig: Take 1 tablet (40 mg total) by mouth daily.    Dispense:  90 tablet    Refill:  3  . rosuvastatin (CRESTOR) 10 MG tablet    Sig: Take 1 tablet (10 mg total) by  mouth daily.    Dispense:  90 tablet    Refill:  3  . Saxagliptin-Metformin (KOMBIGLYZE XR) 07-998 MG TB24    Sig: Take 1 tablet by mouth daily.    Dispense:  90 tablet    Refill:  3  . insulin NPH-regular Human (NOVOLIN 70/30) (70-30) 100 UNIT/ML injection    Sig: INJECT 30 UNITS UNDER THE SKIN BEFORE BREAKFAST AND DINNER, IF YOU SKIP A MEAL DO NOT TAKE THE INSULIN    Dispense:  60 mL    Refill:  3  . Liraglutide (VICTOZA) 18 MG/3ML SOPN    Sig: INJECT 1.8 MG UNDER THE SKIN DAILY    Dispense:  27 mL    Refill:  3

## 2015-04-03 NOTE — Assessment & Plan Note (Addendum)
S: well controlled onNovolin 70/30 NPH/Regular 30 units BID, victoza 1.8mg  daily, Kombiglyze XR 07/998 with no lows Lab Results  Component Value Date   HGBA1C 6.6* 03/13/2015  A/P: weight also down 2 lbs and patient working to get this down further. He will follow up in 4 months with repeat a1c and also at least get direct LDL at that time to monitor lipids. Foot exam today and advised eye exam- he has scheduled.

## 2015-04-03 NOTE — Patient Instructions (Addendum)
Get eye exam scheduled and have faxed to [place] at [phone].  Things look great except for blood pressure but you are out of meds. Sent in- please restart. Monitor at home and if >140/90 with meds see me sooner, otherwise 4 month follow up  No other changes today. Let us know if any issues with the prescription or if needs to go to another destination.

## 2015-04-03 NOTE — Addendum Note (Signed)
Addended by: Clyde Lundborg A on: 04/03/2015 02:22 PM   Modules accepted: Orders

## 2015-04-03 NOTE — Assessment & Plan Note (Signed)
S: poorly controlled but ran out of medicine 2.5 weeks ago. Has had issues as his mail order pharmacy has changed and having issues getting rx transferred BP Readings from Last 3 Encounters:  04/03/15 140/90  03/04/15 140/78  12/28/13 130/80  A/P:Continue current meds:  Refilled today- suspect BP would be controlled back on medicine. We sent in rx to future scripts but he will let us know if any issues. He will do home monitoring and if above 140/90 on meds see [place] sooner but otherwise see in 4 months.

## 2015-04-14 ENCOUNTER — Other Ambulatory Visit: Payer: Self-pay | Admitting: Family Medicine

## 2015-04-18 ENCOUNTER — Other Ambulatory Visit: Payer: Self-pay | Admitting: Family Medicine

## 2015-04-18 MED ORDER — PANTOPRAZOLE SODIUM 40 MG PO TBEC: 40.0000 mg | DELAYED_RELEASE_TABLET | Freq: Every day | ORAL | Status: DC

## 2015-04-18 MED ORDER — AMLODIPINE BESY-BENAZEPRIL HCL 5-20 MG PO CAPS: 1.0000 | ORAL_CAPSULE | Freq: Every day | ORAL | Status: DC

## 2015-04-18 MED ORDER — ROSUVASTATIN CALCIUM 10 MG PO TABS: 10.0000 mg | ORAL_TABLET | Freq: Every day | ORAL | Status: DC

## 2015-04-18 MED ORDER — SAXAGLIPTIN-METFORMIN ER 5-1000 MG PO TB24: 1.0000 | ORAL_TABLET | Freq: Every day | ORAL | Status: DC

## 2015-04-18 MED ORDER — INSULIN NPH ISOPHANE & REGULAR (70-30) 100 UNIT/ML ~~LOC~~ SUSP: SUBCUTANEOUS | Status: DC

## 2015-08-01 ENCOUNTER — Encounter: Payer: Self-pay | Admitting: Family Medicine

## 2015-08-01 ENCOUNTER — Ambulatory Visit (INDEPENDENT_AMBULATORY_CARE_PROVIDER_SITE_OTHER): Payer: BLUE CROSS/BLUE SHIELD | Admitting: Family Medicine

## 2015-08-01 VITALS — BP 140/76 | HR 80 | Temp 98.1°F | Wt 290.0 lb

## 2015-08-01 DIAGNOSIS — E785 Hyperlipidemia, unspecified: Secondary | ICD-10-CM

## 2015-08-01 DIAGNOSIS — I10 Essential (primary) hypertension: Secondary | ICD-10-CM

## 2015-08-01 DIAGNOSIS — Z794 Long term (current) use of insulin: Secondary | ICD-10-CM

## 2015-08-01 DIAGNOSIS — E119 Type 2 diabetes mellitus without complications: Secondary | ICD-10-CM

## 2015-08-01 LAB — POCT GLYCOSYLATED HEMOGLOBIN (HGB A1C): HEMOGLOBIN A1C: 7.4

## 2015-08-01 NOTE — Patient Instructions (Signed)
Lab Results  Component Value Date   HGBA1C 7.4 08/01/2015  a1c is up from 6.6 but still reasonable under 7.5. I think if you can get your weight back down 10 lbs through next visit you can improve the trend again. Blood pressure also slightly above goal. If we cannot get that to goal- consider medication change at follow up  No changes in medicine  Health Maintenance Due  Topic Date Due  . OPHTHALMOLOGY EXAM  08/19/2010  have them send [place] records in July

## 2015-08-01 NOTE — Progress Notes (Signed)
Subjective:  [name] is a 55 y.o. year old very pleasant male patient who presents for/with See problem oriented charting ROS- No chest pain or shortness of breath. No headache or blurry vision. No hypoglycemia  Past Medical History-  Patient Active Problem List   Diagnosis Date Noted  . Morbid obesity ([place]) 12/13/2008    Priority: High  . Type 2 diabetes mellitus ([place]) 03/05/2008    Priority: High  . MELANOMA, MALIGNANT, EAR 12/16/2006    Priority: High  . Essential hypertension 03/04/2015    Priority: Medium  . Depression 12/16/2006    Priority: Medium  . Hyperlipidemia 11/07/2006    Priority: Medium  . GERD (gastroesophageal reflux disease) 03/04/2015    Priority: Low    Medications- reviewed and updated Current Outpatient Prescriptions  Medication Sig Dispense Refill  . amLODipine-benazepril (LOTREL) 5-20 MG capsule Take 1 capsule by mouth daily. 90 capsule 3  . ARIPiprazole (ABILIFY) 5 MG tablet Take 5 mg by mouth daily.      Marland Kitchen buPROPion (WELLBUTRIN XL) 150 MG 24 hr tablet Take 3 tablets by mouth once daily     . glucose blood (FREESTYLE LITE) test strip Use as instructed three times daily     . insulin NPH-regular Human (NOVOLIN 70/30) (70-30) 100 UNIT/ML injection INJECT 30 UNITS UNDER THE SKIN BEFORE BREAKFAST AND DINNER, IF YOU SKIP A MEAL DO NOT TAKE THE INSULIN 60 mL 3  . insulin NPH-regular Human (NOVOLIN 70/30) (70-30) 100 UNIT/ML injection INJECT 25 UNITS UNDER THE SKIN BEFORE BREAKFAST AND DINNER, IF YOU SKIP A MEAL DO NOT TAKE THE INSULIN 60 mL 3  . pantoprazole (PROTONIX) 40 MG tablet Take 1 tablet (40 mg total) by mouth daily. 90 tablet 3  . rosuvastatin (CRESTOR) 10 MG tablet Take 1 tablet (10 mg total) by mouth daily. 90 tablet 3  . Saxagliptin-Metformin (KOMBIGLYZE XR) 07-998 MG TB24 Take 1 tablet by mouth daily. 90 tablet 3  . VICTOZA 18 MG/3ML SOPN Inject subcutaneously 1.8mg  daily 27 mL 6  . desvenlafaxine (PRISTIQ) 50 MG 24 hr tablet Take 50 mg by  mouth daily.       No current facility-administered medications for this visit.    Objective: BP 140/76 mmHg  Pulse 80  Temp(Src) 98.1 F (36.7 C)  Wt 290 lb (131.543 kg) Gen: NAD, resting comfortably CV: RRR no murmurs rubs or gallops Lungs: CTAB no crackles, wheeze, rhonchi Abdomen: soft/nontender/nondistended/normal bowel sounds. No rebound or guarding. Morbid obesity noted Ext: no edema Skin: warm, dry Neuro: grossly normal, moves all extremities  Assessment/Plan:  Essential hypertension S: poorlycontrolled. On amlodipine 5mg -benazepril 20mg   BP Readings from Last 3 Encounters:  08/01/15 140/76  04/03/15 140/90  03/04/15 140/78  A/P:Continue current meds:  Focus on tightening diet and losing 10 lbs again- we may have to titrate medicine up at follow up if not at goal  Type 2 diabetes mellitus ([place]) S: previously controlled. On novolin 70/30 30 units BID, victoza 1.8mg  daily, kombiglyze 07/998 mg (saxagliptin and metformin) daily CBGs- 140s and 150s before meals previously 130s Exercise and diet- recent weight gain  Lab Results  Component Value Date   HGBA1C 7.4 08/01/2015   HGBA1C 6.6* 03/13/2015   HGBA1C 7.9* 12/28/2013   A/P: worsened control with weight up 10 lbs- we opted not to make medication change but focus on tightening diet and losing weight. 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 No myalgias.  Lab Results  Component Value Date   CHOL 180 05/28/2013   HDL 49.10 05/28/2013   LDLCALC 35 02/04/2011   LDLDIRECT 87.0 05/28/2013    TRIG 317.0* 05/28/2013   CHOLHDL 4 05/28/2013   A/P: strongly consider updating lipids at follow up    Return in about 4 months (around 12/02/2015). Return precautions advised.   Orders Placed This Encounter  Procedures  . POC HgB A1c   Garret Reddish, MD

## 2015-08-02 NOTE — Assessment & Plan Note (Signed)
S: well controlled on crestor 10mg . No myalgias.  Lab Results  Component Value Date   CHOL 180 05/28/2013   HDL 49.10 05/28/2013   LDLCALC 35 02/04/2011   LDLDIRECT 87.0 05/28/2013   TRIG 317.0* 05/28/2013   CHOLHDL 4 05/28/2013   A/P: strongly consider updating lipids at follow up

## 2015-08-02 NOTE — Assessment & Plan Note (Signed)
S: previously controlled. On novolin 70/30 30 units BID, victoza 1.8mg  daily, kombiglyze 07/998 mg (saxagliptin and metformin) daily CBGs- 140s and 150s before meals previously 130s Exercise and diet- recent weight gain  Lab Results  Component Value Date   HGBA1C 7.4 08/01/2015   HGBA1C 6.6* 03/13/2015   HGBA1C 7.9* 12/28/2013   A/P: worsened control with weight up 10 lbs- we opted not to make medication change but focus on tightening diet and losing weight. Eye doctor Appointment in [place]

## 2015-08-02 NOTE — Assessment & Plan Note (Signed)
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

## 2015-08-02 NOTE — Assessment & Plan Note (Signed)
S: poorlycontrolled. On amlodipine 5mg -benazepril 20mg   BP Readings from Last 3 Encounters:  08/01/15 140/76  04/03/15 140/90  03/04/15 140/78  A/P:Continue current meds:  Focus on tightening diet and losing 10 lbs again- we may have to titrate medicine up at follow up if not at goal

## 2015-12-02 ENCOUNTER — Encounter: Payer: Self-pay | Admitting: Family Medicine

## 2015-12-02 ENCOUNTER — Ambulatory Visit (INDEPENDENT_AMBULATORY_CARE_PROVIDER_SITE_OTHER): Payer: BLUE CROSS/BLUE SHIELD | Admitting: Family Medicine

## 2015-12-02 VITALS — BP 138/78 | HR 95 | Temp 98.3°F | Wt 291.0 lb

## 2015-12-02 DIAGNOSIS — I10 Essential (primary) hypertension: Secondary | ICD-10-CM

## 2015-12-02 DIAGNOSIS — E785 Hyperlipidemia, unspecified: Secondary | ICD-10-CM | POA: Diagnosis not present

## 2015-12-02 DIAGNOSIS — E119 Type 2 diabetes mellitus without complications: Secondary | ICD-10-CM | POA: Diagnosis not present

## 2015-12-02 DIAGNOSIS — Z23 Encounter for immunization: Secondary | ICD-10-CM | POA: Diagnosis not present

## 2015-12-02 DIAGNOSIS — Z794 Long term (current) use of insulin: Secondary | ICD-10-CM

## 2015-12-02 DIAGNOSIS — L309 Dermatitis, unspecified: Secondary | ICD-10-CM | POA: Diagnosis not present

## 2015-12-02 DIAGNOSIS — R319 Hematuria, unspecified: Secondary | ICD-10-CM

## 2015-12-02 LAB — POC URINALSYSI DIPSTICK (AUTOMATED)
Bilirubin, UA: NEGATIVE
GLUCOSE UA: NEGATIVE
KETONES UA: NEGATIVE
Leukocytes, UA: NEGATIVE
Nitrite, UA: NEGATIVE
Protein, UA: NEGATIVE
UROBILINOGEN UA: 0.2
pH, UA: 5

## 2015-12-02 MED ORDER — TRIAMCINOLONE ACETONIDE 0.1 % EX CREA: 1.0000 "application " | TOPICAL_CREAM | Freq: Two times a day (BID) | CUTANEOUS | 0 refills | Status: DC

## 2015-12-02 NOTE — Progress Notes (Signed)
Pre visit review using our clinic review tool, if applicable. No additional management support is needed unless otherwise documented below in the visit note. 

## 2015-12-02 NOTE — Assessment & Plan Note (Signed)
S: suspect well controlled on crestor 10mg . No myalgias.  Lab Results  Component Value Date   CHOL 180 05/28/2013   HDL 49.10 05/28/2013   LDLCALC 35 02/04/2011   LDLDIRECT 87.0 05/28/2013   TRIG 317.0 (H) 05/28/2013   CHOLHDL 4 05/28/2013   A/P: update direct ldl today, has been doing well except for high triglycerides

## 2015-12-02 NOTE — Patient Instructions (Signed)
plan to use triamcinolone (steroid cream) for 7-10 days twice a day, covering with eucerin with each triamcinolone use. After 10 days- needs consistent use of eucerin in affected areas  Labs before you leave  Flu shot today  Eye exam- get [place] a copy once you get this done  Increase insulin to 31 units twice a day. Great job with walking! Try to cut down on the eating out and focus on eating healthy meals at home

## 2015-12-02 NOTE — Assessment & Plan Note (Signed)
S: controlled on amlodipine 5mg -benazepril 20mg . Improved control despite no weight loss as intended but has walked 3x a week 30 minutes.  BP Readings from Last 3 Encounters:  12/02/15 138/78  08/01/15 140/76  04/03/15 140/90  A/P:Continue current meds:  Improved control though systolic near goal. Hopeful can lose some weight

## 2015-12-02 NOTE — Assessment & Plan Note (Signed)
S: suspect reasonably controlled. On novolin 70/30 units BID, victoza 1.8mg  daily, kombiglyze 5/1000mg  CBGs- 150s to 160s in the morning and before dinner 150s to 160s or slightly lower.  Exercise and diet- walking, needs to work on diet. Eats out a lot to save time and that has been difficult Lab Results  Component Value Date   HGBA1C 7.4 08/01/2015   HGBA1C 6.6 (H) 03/13/2015   HGBA1C 7.9 (H) 12/28/2013   A/P: we will titrate to 31 units twice a day of novolin 70/30. We also discussed combo of victoza and saxagliptin likely not beneficial and once a1c controlled consider stopping kombiglyze and continuing metformin only

## 2015-12-02 NOTE — Assessment & Plan Note (Signed)
S: recurring issue on right hand- some scaling down 3rd finger on palmar side, 2x2 patch on dorsal side. Hydrocortisone helps when he uses it A/P: plan to use triamcinolone (steroid cream) for 7-10 days twice a day, covering with eucerin with each triamcinolone use. After 10 days- needs consistent use of eucerin in affected areas

## 2015-12-02 NOTE — Progress Notes (Signed)
Subjective:  [name] is a 55 y.o. year old very pleasant male patient who presents for/with See problem oriented charting ROS- No chest pain or shortness of breath. No headache or blurry vision. No hypoglycemia. see any ROS included in HPI as well.   Past Medical History-  Patient Active Problem List   Diagnosis Date Noted  . Morbid obesity ([place]) 12/13/2008    Priority: High  . Type 2 diabetes mellitus ([place]) 03/05/2008    Priority: High  . MELANOMA, MALIGNANT, EAR 12/16/2006    Priority: High  . Essential hypertension 03/04/2015    Priority: Medium  . Depression 12/16/2006    Priority: Medium  . Hyperlipidemia 11/07/2006    Priority: Medium  . GERD (gastroesophageal reflux disease) 03/04/2015    Priority: Low  . Eczema 12/02/2015    Medications- reviewed and updated Current Outpatient Prescriptions  Medication Sig Dispense Refill  . amLODipine-benazepril (LOTREL) 5-20 MG capsule Take 1 capsule by mouth daily. 90 capsule 3  . ARIPiprazole (ABILIFY) 5 MG tablet Take 5 mg by mouth daily.      Marland Kitchen buPROPion (WELLBUTRIN XL) 150 MG 24 hr tablet Take 3 tablets by mouth once daily     . desvenlafaxine (PRISTIQ) 50 MG 24 hr tablet Take 50 mg by mouth daily.      Marland Kitchen glucose blood (FREESTYLE LITE) test strip Use as instructed three times daily     . insulin NPH-regular Human (NOVOLIN 70/30) (70-30) 100 UNIT/ML injection INJECT 30 UNITS UNDER THE SKIN BEFORE BREAKFAST AND DINNER, IF YOU SKIP A MEAL DO NOT TAKE THE INSULIN 60 mL 3  . pantoprazole (PROTONIX) 40 MG tablet Take 1 tablet (40 mg total) by mouth daily. 90 tablet 3  . rosuvastatin (CRESTOR) 10 MG tablet Take 1 tablet (10 mg total) by mouth daily. 90 tablet 3  . Saxagliptin-Metformin (KOMBIGLYZE XR) 07-998 MG TB24 Take 1 tablet by mouth daily. 90 tablet 3  . VICTOZA 18 MG/3ML SOPN Inject subcutaneously 1.8mg  daily 27 mL 6   No current facility-administered medications for this visit.     Objective: BP 138/78 (BP Location:  Left Arm, Patient Position: Sitting, Cuff Size: Large)   Pulse 95   Temp 98.3 F (36.8 C) (Oral)   Wt 291 lb (132 kg)   SpO2 96%   BMI 42.97 kg/m  Gen: NAD, resting comfortably CV: RRR no murmurs rubs or gallops Lungs: CTAB no crackles, wheeze, rhonchi Abdomen: soft/nontender/nondistended/normal bowel sounds. Morbid obesity Ext: no edema Skin: warm, dry, dry area with some cracking of skin and some scaling on 3rd finger palmar side and on dorsal side near wrist about 2 x 2 cm patch Neuro: grossly normal, moves all extremities  Assessment/Plan:   Type 2 diabetes mellitus ([place]) S: suspect reasonably controlled. On novolin 70/30 units BID, victoza 1.8mg  daily, kombiglyze 5/1000mg  CBGs- 150s to 160s in the morning and before dinner 150s to 160s or slightly lower.  Exercise and diet- walking, needs to work on diet. Eats out a lot to save time and that has been difficult Lab Results  Component Value Date   HGBA1C 7.4 08/01/2015   HGBA1C 6.6 (H) 03/13/2015   HGBA1C 7.9 (H) 12/28/2013   A/P: we will titrate to 31 units twice a day of novolin 70/30. We also discussed combo of victoza and saxagliptin likely not beneficial and once a1c controlled consider stopping kombiglyze and continuing metformin only  Hyperlipidemia S: suspect well controlled on crestor 10mg . No myalgias.  Lab Results  Component  Value Date   CHOL 180 05/28/2013   HDL 49.10 05/28/2013   LDLCALC 35 02/04/2011   LDLDIRECT 87.0 05/28/2013   TRIG 317.0 (H) 05/28/2013   CHOLHDL 4 05/28/2013   A/P: update direct ldl today, has been doing well except for high triglycerides  Essential hypertension S: controlled on amlodipine 5mg -benazepril 20mg . Improved control despite no weight loss as intended but has walked 3x a week 30 minutes.  BP Readings from Last 3 Encounters:  12/02/15 138/78  08/01/15 140/76  04/03/15 140/90  A/P:Continue current meds:  Improved control though systolic near goal. Hopeful can lose some  weight  Eczema S: recurring issue on right hand- some scaling down 3rd finger on palmar side, 2x2 patch on dorsal side. Hydrocortisone helps when he uses it A/P: plan to use triamcinolone (steroid cream) for 7-10 days twice a day, covering with eucerin with each triamcinolone use. After 10 days- needs consistent use of eucerin in affected areas   3-4 months depending on a1c. 3 definitely if above 7.5  Orders Placed This Encounter  Procedures  . CBC    [place]  . Comprehensive metabolic panel    [place]  . Hemoglobin A1c    [place]  . LDL cholesterol, direct    [place]  . POCT Urinalysis Dipstick (Automated)   Return precautions advised.  Garret Reddish, MD

## 2015-12-03 LAB — CBC
HEMATOCRIT: 40.7 % (ref 39.0–52.0)
Hemoglobin: 13.8 g/dL (ref 13.0–17.0)
MCHC: 34 g/dL (ref 30.0–36.0)
MCV: 83.8 fl (ref 78.0–100.0)
Platelets: 251 10*3/uL (ref 150.0–400.0)
RBC: 4.85 Mil/uL (ref 4.22–5.81)
RDW: 13.9 % (ref 11.5–15.5)
WBC: 7.4 10*3/uL (ref 4.0–10.5)

## 2015-12-03 LAB — COMPREHENSIVE METABOLIC PANEL
ALBUMIN: 4.3 g/dL (ref 3.5–5.2)
ALT: 27 U/L (ref 0–53)
AST: 18 U/L (ref 0–37)
Alkaline Phosphatase: 56 U/L (ref 39–117)
BUN: 17 mg/dL (ref 6–23)
CHLORIDE: 99 meq/L (ref 96–112)
CO2: 27 meq/L (ref 19–32)
CREATININE: 1.13 mg/dL (ref 0.40–1.50)
Calcium: 9.3 mg/dL (ref 8.4–10.5)
GFR: 71.35 mL/min (ref >60.00)
Glucose, Bld: 177 mg/dL — ABNORMAL HIGH (ref 70–99)
POTASSIUM: 4.4 meq/L (ref 3.5–5.1)
SODIUM: 136 meq/L (ref 135–145)
Total Bilirubin: 0.4 mg/dL (ref 0.2–1.2)
Total Protein: 7.1 g/dL (ref 6.0–8.3)

## 2015-12-03 LAB — HEMOGLOBIN A1C: Hgb A1c MFr Bld: 7.9 % — ABNORMAL HIGH (ref 4.6–6.5)

## 2015-12-03 LAB — LDL CHOLESTEROL, DIRECT: Direct LDL: 86 mg/dL

## 2015-12-03 NOTE — Addendum Note (Signed)
Addended by: Elmer Picker on: 12/03/2015 02:32 PM   Modules accepted: Orders

## 2015-12-05 ENCOUNTER — Telehealth: Payer: Self-pay | Admitting: Family Medicine

## 2015-12-05 NOTE — Telephone Encounter (Signed)
Pt is returning jaime call

## 2016-01-27 ENCOUNTER — Other Ambulatory Visit: Payer: Self-pay | Admitting: Family Medicine

## 2016-02-11 ENCOUNTER — Other Ambulatory Visit: Payer: Self-pay | Admitting: Family Medicine

## 2016-04-02 ENCOUNTER — Ambulatory Visit (INDEPENDENT_AMBULATORY_CARE_PROVIDER_SITE_OTHER): Payer: 59 | Admitting: Family Medicine

## 2016-04-02 ENCOUNTER — Encounter: Payer: Self-pay | Admitting: Family Medicine

## 2016-04-02 VITALS — BP 130/80 | HR 93 | Temp 97.8°F | Ht 69.0 in | Wt 287.2 lb

## 2016-04-02 DIAGNOSIS — E785 Hyperlipidemia, unspecified: Secondary | ICD-10-CM

## 2016-04-02 DIAGNOSIS — I10 Essential (primary) hypertension: Secondary | ICD-10-CM

## 2016-04-02 DIAGNOSIS — Z23 Encounter for immunization: Secondary | ICD-10-CM

## 2016-04-02 DIAGNOSIS — Z794 Long term (current) use of insulin: Secondary | ICD-10-CM | POA: Diagnosis not present

## 2016-04-02 DIAGNOSIS — E119 Type 2 diabetes mellitus without complications: Secondary | ICD-10-CM | POA: Diagnosis not present

## 2016-04-02 LAB — POCT GLYCOSYLATED HEMOGLOBIN (HGB A1C): HEMOGLOBIN A1C: 7.1

## 2016-04-02 NOTE — Assessment & Plan Note (Signed)
S: controlled on amlodipine 5mg -benazepril 20mg .  BP Readings from Last 3 Encounters:  04/02/16 130/80  12/02/15 138/78  08/01/15 140/76  A/P:Continue current meds:  Spent several minutes discussing newer 130/80 guidelines and contrasting with jnc8- and fact aafp supports jnc8 still- will focus on getting below 130/80 with weight loss no med change unless over 140/90

## 2016-04-02 NOTE — Assessment & Plan Note (Signed)
S: reasonably controlled on crestor 10mg . No myalgias.  Lab Results  Component Value Date   CHOL 180 05/28/2013   HDL 49.10 05/28/2013   LDLCALC 35 02/04/2011   LDLDIRECT 86.0 12/02/2015   TRIG 317.0 (H) 05/28/2013   CHOLHDL 4 05/28/2013   A/P: hopefully triglycerides have improved by next check

## 2016-04-02 NOTE — Assessment & Plan Note (Addendum)
S: hopefully improving control. On novolin 70/30 about 31 units BID, victoza 1.8mg , kombiglyze 5/1000mg .  CBGs- more in 140s down from 150s or so. No low blood sugars.  Exercise and diet- weight down 4 lbs. Walking 4x a week for 30 minutes and eating more grains/salads Lab Results  Component Value Date   HGBA1C 7.9 (H) 12/02/2015   HGBA1C 7.4 08/01/2015   HGBA1C 6.6 (H) 03/13/2015   A/P: a1c down to 7.1! hopeful with continued weight loss efforts to get this below 7. No changes as long as trending down or below 7.5

## 2016-04-02 NOTE — Progress Notes (Signed)
Pre visit review using our clinic review tool, if applicable. No additional management support is needed unless otherwise documented below in the visit note. 

## 2016-04-02 NOTE — Progress Notes (Signed)
Subjective:  [name] is a 56 y.o. year old very pleasant male patient who presents for/with See problem oriented charting ROS- no hypoglycemia. No chest pain or shortness of breath. No headache or blurry vision.    Past Medical History-  Patient Active Problem List   Diagnosis Date Noted  . Morbid obesity ([place]) 12/13/2008    Priority: High  . Type 2 diabetes mellitus ([place]) 03/05/2008    Priority: High  . MELANOMA, MALIGNANT, EAR 12/16/2006    Priority: High  . Essential hypertension 03/04/2015    Priority: Medium  . Depression 12/16/2006    Priority: Medium  . Hyperlipidemia 11/07/2006    Priority: Medium  . GERD (gastroesophageal reflux disease) 03/04/2015    Priority: Low  . Eczema 12/02/2015    Medications- reviewed and updated Current Outpatient Prescriptions  Medication Sig Dispense Refill  . amLODipine-benazepril (LOTREL) 5-20 MG capsule TAKE 1 CAPSULE BY MOUTH  DAILY 90 capsule 2  . ARIPiprazole (ABILIFY) 5 MG tablet Take 5 mg by mouth daily.      Marland Kitchen buPROPion (WELLBUTRIN XL) 150 MG 24 hr tablet Take 3 tablets by mouth once daily     . desvenlafaxine (PRISTIQ) 50 MG 24 hr tablet Take 50 mg by mouth daily.      Marland Kitchen glucose blood (FREESTYLE LITE) test strip Use as instructed three times daily     . insulin NPH-regular Human (NOVOLIN 70/30) (70-30) 100 UNIT/ML injection INJECT 30 UNITS UNDER THE SKIN BEFORE BREAKFAST AND DINNER, IF YOU SKIP A MEAL DO NOT TAKE THE INSULIN 60 mL 3  . KOMBIGLYZE XR 07-998 MG TB24 TAKE 1 TABLET BY MOUTH  DAILY 90 tablet 1  . pantoprazole (PROTONIX) 40 MG tablet TAKE 1 TABLET BY MOUTH  DAILY 90 tablet 1  . rosuvastatin (CRESTOR) 10 MG tablet TAKE 1 TABLET BY MOUTH  DAILY 90 tablet 1  . triamcinolone cream (KENALOG) 0.1 % Apply 1 application topically 2 (two) times daily. For 7-10 days maximum before at least 10 day break 30 g 0  . VICTOZA 18 MG/3ML SOPN Inject subcutaneously 1.8mg  daily 27 mL 6   No current facility-administered medications  for this visit.     Objective: BP 130/80   Pulse 93   Temp 97.8 F (36.6 C) (Oral)   Ht 5\' 9"  (1.753 m)   Wt 287 lb 3.2 oz (130.3 kg)   SpO2 97%   BMI 42.41 kg/m  Gen: NAD, resting comfortably CV: RRR no murmurs rubs or gallops Lungs: CTAB no crackles, wheeze, rhonchi  Ext: no edema Skin: warm, dry  Diabetic Foot Exam - Simple   Simple Foot Form Diabetic Foot exam was performed with the following findings:  Yes 04/02/2016  2:56 PM  Visual Inspection No deformities, no ulcerations, no other skin breakdown bilaterally:  Yes Sensation Testing Intact to touch and monofilament testing bilaterally:  Yes Pulse Check Posterior Tibialis and Dorsalis pulse intact bilaterally:  Yes Comments    Assessment/Plan:  Type 2 diabetes mellitus ([place]) S: hopefully improving control. On novolin 70/30 about 31 units BID, victoza 1.8mg , kombiglyze 5/1000mg .  CBGs- more in 140s down from 150s or so. No low blood sugars.  Exercise and diet- weight down 4 lbs. Walking 4x a week for 30 minutes and eating more grains/salads Lab Results  Component Value Date   HGBA1C 7.9 (H) 12/02/2015   HGBA1C 7.4 08/01/2015   HGBA1C 6.6 (H) 03/13/2015   A/P: a1c down to 7.1! hopeful with continued weight loss efforts to  get this below 7. No changes as long as trending down or below 7.5  Morbid obesity ([facility]) S: down 4 lbs! Great job. Great job walking. See dm section A/P: Encouraged need for healthy eating, regular exercise, weight loss.    Hyperlipidemia S: reasonably controlled on crestor 10mg . No myalgias.  Lab Results  Component Value Date   CHOL 180 05/28/2013   HDL 49.10 05/28/2013   LDLCALC 35 02/04/2011   LDLDIRECT 86.0 12/02/2015   TRIG 317.0 (H) 05/28/2013   CHOLHDL 4 05/28/2013   A/P: hopefully triglycerides have improved by next check  Essential hypertension S: controlled on amlodipine 5mg -benazepril 20mg .  BP Readings from Last 3 Encounters:  04/02/16 130/80  12/02/15 138/78  08/01/15  140/76  A/P:Continue current meds:  Spent several minutes discussing newer 130/80 guidelines and contrasting with jnc8- and fact aafp supports jnc8 still- will focus on getting below 130/80 with weight loss no med change unless over 140/90  4 month follow up  Orders Placed This Encounter  Procedures  . POCT glycosylated hemoglobin (Hb A1C)   Return precautions advised.  Garret Reddish, MD

## 2016-04-02 NOTE — Assessment & Plan Note (Signed)
S: down 4 lbs! Great job. Great job walking. See dm section A/P: Encouraged need for healthy eating, regular exercise, weight loss.

## 2016-04-02 NOTE — Patient Instructions (Addendum)
Tdap today  a1c down to 7.1!!!  Call to get your eye exam today!!!

## 2016-04-05 ENCOUNTER — Encounter: Payer: Self-pay | Admitting: Family Medicine

## 2016-04-05 ENCOUNTER — Other Ambulatory Visit: Payer: Self-pay

## 2016-04-05 MED ORDER — BUPROPION HCL ER (XL) 150 MG PO TB24: 150.0000 mg | ORAL_TABLET | Freq: Every day | ORAL | 3 refills | Status: DC

## 2016-04-05 MED ORDER — ARIPIPRAZOLE 5 MG PO TABS: 5.0000 mg | ORAL_TABLET | Freq: Every day | ORAL | 3 refills | Status: DC

## 2016-04-05 MED ORDER — TRIAMCINOLONE ACETONIDE 0.1 % EX CREA: 1.0000 "application " | TOPICAL_CREAM | Freq: Two times a day (BID) | CUTANEOUS | 0 refills | Status: DC

## 2016-04-05 MED ORDER — LIRAGLUTIDE 18 MG/3ML ~~LOC~~ SOPN: PEN_INJECTOR | SUBCUTANEOUS | 6 refills | Status: DC

## 2016-04-05 MED ORDER — ROSUVASTATIN CALCIUM 10 MG PO TABS: 10.0000 mg | ORAL_TABLET | Freq: Every day | ORAL | 1 refills | Status: DC

## 2016-04-05 MED ORDER — AMLODIPINE BESY-BENAZEPRIL HCL 5-20 MG PO CAPS: 1.0000 | ORAL_CAPSULE | Freq: Every day | ORAL | 2 refills | Status: DC

## 2016-04-05 MED ORDER — SAXAGLIPTIN-METFORMIN ER 5-1000 MG PO TB24: 1.0000 | ORAL_TABLET | Freq: Every day | ORAL | 1 refills | Status: DC

## 2016-04-05 MED ORDER — GLUCOSE BLOOD VI STRP: ORAL_STRIP | 5 refills | Status: DC

## 2016-04-05 MED ORDER — DESVENLAFAXINE SUCCINATE ER 50 MG PO TB24: 50.0000 mg | ORAL_TABLET | Freq: Every day | ORAL | 3 refills | Status: DC

## 2016-04-05 MED ORDER — PANTOPRAZOLE SODIUM 40 MG PO TBEC
40.0000 mg | DELAYED_RELEASE_TABLET | Freq: Every day | ORAL | 1 refills | Status: DC
Start: 2016-04-05 — End: 2016-07-05

## 2016-04-06 ENCOUNTER — Other Ambulatory Visit: Payer: Self-pay | Admitting: *Deleted

## 2016-04-06 MED ORDER — ONETOUCH VERIO FLEX SYSTEM W/DEVICE KIT: 1.0000 | PACK | Freq: Three times a day (TID) | 0 refills | Status: DC

## 2016-04-06 MED ORDER — GLUCOSE BLOOD VI STRP: ORAL_STRIP | 5 refills | Status: DC

## 2016-04-06 MED ORDER — ONETOUCH DELICA LANCETS 33G MISC: 5 refills | Status: DC

## 2016-04-13 MED ORDER — SITAGLIP PHOS-METFORMIN HCL ER 100-1000 MG PO TB24: 1.0000 | ORAL_TABLET | Freq: Every day | ORAL | 3 refills | Status: DC

## 2016-04-20 ENCOUNTER — Other Ambulatory Visit: Payer: Self-pay

## 2016-04-20 ENCOUNTER — Encounter: Payer: Self-pay | Admitting: Family Medicine

## 2016-04-20 MED ORDER — INSULIN NPH ISOPHANE & REGULAR (70-30) 100 UNIT/ML ~~LOC~~ SUSP: SUBCUTANEOUS | 3 refills | Status: DC

## 2016-04-21 LAB — HM DIABETES EYE EXAM

## 2016-04-22 ENCOUNTER — Encounter: Payer: Self-pay | Admitting: Family Medicine

## 2016-07-05 ENCOUNTER — Other Ambulatory Visit: Payer: Self-pay

## 2016-07-05 MED ORDER — PANTOPRAZOLE SODIUM 40 MG PO TBEC: 40.0000 mg | DELAYED_RELEASE_TABLET | Freq: Every day | ORAL | 0 refills | Status: DC

## 2016-07-27 ENCOUNTER — Other Ambulatory Visit (INDEPENDENT_AMBULATORY_CARE_PROVIDER_SITE_OTHER): Payer: 59

## 2016-07-27 DIAGNOSIS — Z0000 Encounter for general adult medical examination without abnormal findings: Secondary | ICD-10-CM

## 2016-07-27 DIAGNOSIS — Z125 Encounter for screening for malignant neoplasm of prostate: Secondary | ICD-10-CM

## 2016-07-27 DIAGNOSIS — R82998 Other abnormal findings in urine: Secondary | ICD-10-CM

## 2016-07-27 DIAGNOSIS — R319 Hematuria, unspecified: Secondary | ICD-10-CM | POA: Diagnosis not present

## 2016-07-27 DIAGNOSIS — E119 Type 2 diabetes mellitus without complications: Secondary | ICD-10-CM | POA: Diagnosis not present

## 2016-07-27 LAB — COMPREHENSIVE METABOLIC PANEL
ALBUMIN: 4.2 g/dL (ref 3.5–5.2)
ALK PHOS: 55 U/L (ref 39–117)
ALT: 36 U/L (ref 0–53)
AST: 27 U/L (ref 0–37)
BILIRUBIN TOTAL: 0.3 mg/dL (ref 0.2–1.2)
BUN: 11 mg/dL (ref 6–23)
CO2: 30 mEq/L (ref 19–32)
Calcium: 9.3 mg/dL (ref 8.4–10.5)
Chloride: 101 mEq/L (ref 96–112)
Creatinine, Ser: 1.08 mg/dL (ref 0.40–1.50)
GFR: 75 mL/min (ref >60.00)
Glucose, Bld: 173 mg/dL — ABNORMAL HIGH (ref 70–99)
Potassium: 5 mEq/L (ref 3.5–5.1)
SODIUM: 139 meq/L (ref 135–145)
TOTAL PROTEIN: 6.9 g/dL (ref 6.0–8.3)

## 2016-07-27 LAB — POC URINALSYSI DIPSTICK (AUTOMATED)
BILIRUBIN UA: NEGATIVE
GLUCOSE UA: NEGATIVE
KETONES UA: NEGATIVE
Nitrite, UA: NEGATIVE
PROTEIN UA: NEGATIVE
Urobilinogen, UA: 0.2 E.U./dL
pH, UA: 6 (ref 5.0–8.0)

## 2016-07-27 LAB — CBC WITH DIFFERENTIAL/PLATELET
BASOS ABS: 0 10*3/uL (ref 0.0–0.1)
Basophils Relative: 0.4 % (ref 0.0–3.0)
EOS ABS: 0.3 10*3/uL (ref 0.0–0.7)
EOS PCT: 4.2 % (ref 0.0–5.0)
HCT: 41.9 % (ref 39.0–52.0)
HEMOGLOBIN: 14.2 g/dL (ref 13.0–17.0)
Lymphocytes Relative: 31.8 % (ref 12.0–46.0)
Lymphs Abs: 2.2 10*3/uL (ref 0.7–4.0)
MCHC: 33.9 g/dL (ref 30.0–36.0)
MCV: 84.8 fl (ref 78.0–100.0)
Monocytes Absolute: 0.5 10*3/uL (ref 0.1–1.0)
Monocytes Relative: 7.2 % (ref 3.0–12.0)
NEUTROS PCT: 56.4 % (ref 43.0–77.0)
Neutro Abs: 3.9 10*3/uL (ref 1.4–7.7)
Platelets: 286 10*3/uL (ref 150.0–400.0)
RBC: 4.94 Mil/uL (ref 4.22–5.81)
RDW: 13.5 % (ref 11.5–15.5)
WBC: 7 10*3/uL (ref 4.0–10.5)

## 2016-07-27 LAB — LIPID PANEL
CHOLESTEROL: 167 mg/dL (ref 0–200)
HDL: 56.9 mg/dL (ref >39.00)
NonHDL: 109.63
TRIGLYCERIDES: 271 mg/dL — AB (ref 0.0–149.0)
Total CHOL/HDL Ratio: 3
VLDL: 54.2 mg/dL — AB (ref 0.0–40.0)

## 2016-07-27 LAB — PSA: PSA: 0.81 ng/mL (ref 0.10–4.00)

## 2016-07-27 LAB — URINALYSIS, MICROSCOPIC ONLY: RBC / HPF: NONE SEEN (ref 0–?)

## 2016-07-27 LAB — LDL CHOLESTEROL, DIRECT: Direct LDL: 67 mg/dL

## 2016-07-27 LAB — HEMOGLOBIN A1C: Hgb A1c MFr Bld: 7.9 % — ABNORMAL HIGH (ref 4.6–6.5)

## 2016-07-29 LAB — URINE CULTURE: ORGANISM ID, BACTERIA: NO GROWTH

## 2016-08-03 ENCOUNTER — Encounter: Payer: Self-pay | Admitting: Family Medicine

## 2016-08-03 ENCOUNTER — Ambulatory Visit (INDEPENDENT_AMBULATORY_CARE_PROVIDER_SITE_OTHER): Payer: 59 | Admitting: Family Medicine

## 2016-08-03 VITALS — BP 138/82 | HR 82 | Temp 97.7°F | Ht 69.0 in | Wt 290.2 lb

## 2016-08-03 DIAGNOSIS — Z23 Encounter for immunization: Secondary | ICD-10-CM | POA: Diagnosis not present

## 2016-08-03 DIAGNOSIS — E7800 Pure hypercholesterolemia, unspecified: Secondary | ICD-10-CM

## 2016-08-03 DIAGNOSIS — Z0000 Encounter for general adult medical examination without abnormal findings: Secondary | ICD-10-CM

## 2016-08-03 DIAGNOSIS — Z85820 Personal history of malignant melanoma of skin: Secondary | ICD-10-CM | POA: Diagnosis not present

## 2016-08-03 DIAGNOSIS — E119 Type 2 diabetes mellitus without complications: Secondary | ICD-10-CM

## 2016-08-03 DIAGNOSIS — Z794 Long term (current) use of insulin: Secondary | ICD-10-CM | POA: Diagnosis not present

## 2016-08-03 DIAGNOSIS — I10 Essential (primary) hypertension: Secondary | ICD-10-CM

## 2016-08-03 MED ORDER — SITAGLIP PHOS-METFORMIN HCL ER 50-1000 MG PO TB24: ORAL_TABLET | ORAL | 3 refills | Status: DC

## 2016-08-03 NOTE — Assessment & Plan Note (Signed)
S: mild poorly controlled. On novolog 70/30 31 units BID, victoza 1.8mg , janumet XR 100-1000mg  CBGs- fasting before restarted exercise was 160-170. Restarted exercise and down to 140-150 Exercise and diet-weight down 4 lbs last visit, now crept back up Lab Results  Component Value Date   HGBA1C 7.9 (H) 07/27/2016   HGBA1C 7.1 04/02/2016   HGBA1C 7.9 (H) 12/02/2015   A/P: change janumet to 50-1000mg  twice a day instead of 50-1000mg  once a day. He is restarting exercise. Follow up 14 weeks- suspect will improve drastically

## 2016-08-03 NOTE — Assessment & Plan Note (Signed)
S: reasonably controlled on crestor 10mg - triglycerides would be ideally below 200.   Lab Results  Component Value Date   CHOL 167 07/27/2016   HDL 56.90 07/27/2016   LDLCALC 35 02/04/2011   LDLDIRECT 67.0 07/27/2016   TRIG 271.0 (H) 07/27/2016   CHOLHDL 3 07/27/2016   A/P: well controlled. Work on [employer] for triglycerides

## 2016-08-03 NOTE — Addendum Note (Signed)
Addended by: Lucianne Lei M on: 08/03/2016 09:40 AM   Modules accepted: Orders

## 2016-08-03 NOTE — Progress Notes (Signed)
Phone: [phone]  Subjective:  Patient presents today for their annual physical. Chief complaint-noted.   See problem oriented charting- ROS- full  review of systems was completed and negative including No chest pain or shortness of breath. No headache or blurry vision.   The following were reviewed and entered/updated in epic: Past Medical History:  Diagnosis Date  . Cancer (HCC)    melanoma;  right ear  . Diabetes mellitus   . GERD (gastroesophageal reflux disease)   . Hyperlipidemia   . Hypertension   . Metabolic syndrome   . Obesity    Patient Active Problem List   Diagnosis Date Noted  . Morbid obesity ([facility]) 12/13/2008    Priority: High  . Type 2 diabetes mellitus ([facility]) 03/05/2008    Priority: High  . History of melanoma. R ear.  12/16/2006    Priority: High  . Essential hypertension 03/04/2015    Priority: Medium  . Depression 12/16/2006    Priority: Medium  . Hyperlipidemia 11/07/2006    Priority: Medium  . Eczema 12/02/2015    Priority: Low  . GERD (gastroesophageal reflux disease) 03/04/2015    Priority: Low   Past Surgical History:  Procedure Laterality Date  . CYST REMOVAL TRUNK  1989   benign  . MELANOMA EXCISION  2008   right ear and neck  . reconstructive plastic surgery face  1967   dog bite at age 5; about 15 surgeries for 10 years  . TONSILLECTOMY  1966   age 4    Family History  Problem Relation Age of Onset  . Arthritis Mother   . Stroke Father 43    heavy smoker 4-5 packs a day unfiltered  . Colon cancer Neg Hx   . Esophageal cancer Neg Hx   . Rectal cancer Neg Hx   . Stomach cancer Neg Hx   . Schizophrenia Father     homeless and did not get medical care in time  . Alcoholism Father     Medications- reviewed and updated Current Outpatient Prescriptions  Medication Sig Dispense Refill  . amLODipine-benazepril (LOTREL) 5-20 MG capsule Take 1 capsule by mouth daily. 90 capsule 2  . ARIPiprazole (ABILIFY) 5 MG tablet Take 1  tablet (5 mg total) by mouth daily. 90 tablet 3  . Blood Glucose Monitoring Suppl (ONETOUCH VERIO FLEX SYSTEM) w/Device KIT 1 each by Does not apply route 3 (three) times daily. 1 kit 0  . buPROPion (WELLBUTRIN XL) 150 MG 24 hr tablet Take 1 tablet (150 mg total) by mouth daily. Take 3 tablets by mouth once daily 90 tablet 3  . desvenlafaxine (PRISTIQ) 50 MG 24 hr tablet Take 1 tablet (50 mg total) by mouth daily. 90 tablet 3  . glucose blood (ONETOUCH VERIO) test strip USE TO CHECK BLOOD SUGAR THREE TIMES A DAY AND PRN 100 each 5  . insulin NPH-regular Human (NOVOLIN 70/30) (70-30) 100 UNIT/ML injection INJECT 30 UNITS UNDER THE SKIN BEFORE BREAKFAST AND DINNER, IF YOU SKIP A MEAL DO NOT TAKE THE INSULIN 60 mL 3  . liraglutide (VICTOZA) 18 MG/3ML SOPN Inject subcutaneously 1.8mg daily 27 mL 6  . ONETOUCH DELICA LANCETS 33G MISC USE TO CHECK BLOOD SUGAR THREE TIMES A DAY AND PRN 100 each 5  . pantoprazole (PROTONIX) 40 MG tablet Take 1 tablet (40 mg total) by mouth daily. 90 tablet 0  . rosuvastatin (CRESTOR) 10 MG tablet Take 1 tablet (10 mg total) by mouth daily. 90 tablet 1  . SitaGLIPtin-MetFORMIN HCl (JANUMET   XR) 100-1000 MG TB24 Take 1 tablet by mouth daily. 90 tablet 3  . triamcinolone cream (KENALOG) 0.1 % Apply 1 application topically 2 (two) times daily. For 7-10 days maximum before at least 10 day break 30 g 0   Allergies-reviewed and updated Allergies  Allergen Reactions  . Codeine Nausea And Vomiting  . Food Hives    Coconut flakes    Social History   Social History  . Marital status: Single    Spouse name: N/A  . Number of children: N/A  . Years of education: N/A   Social History Main Topics  . Smoking status: Former Smoker    Packs/day: 0.50    Years: 5.00    Types: Cigarettes    Quit date: 05/19/1988  . Smokeless tobacco: Never Used     Comment: quit in 1990  . Alcohol use 0.0 oz/week     Comment: socially every other week 2 beers  . Drug use: No  . Sexual  activity: No   Other Topics Concern  . None   Social History Narrative   Single. 1 dog, 2 cats. Lives alone.       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 Enjoys dinner parties.     Objective: BP 138/82   Pulse 82   Temp 97.7 F (36.5 C) (Oral)   Ht 5' 9" (1.753 m)   Wt 290 lb 3.2 oz (131.6 kg)   SpO2 95%   BMI 42.86 kg/m  Gen: NAD, resting comfortably HEENT: Mucous membranes are moist. Oropharynx normal Neck: no thyromegaly CV: RRR no murmurs rubs or gallops Lungs: CTAB no crackles, wheeze, rhonchi Abdomen: soft/nontender/nondistended/normal bowel sounds. No rebound or guarding.  Ext: no edema Skin: warm, dry, facial scarring from prior dog attack and scars from prior melanoma surgery Neuro: grossly normal, moves all extremities, PERRLA Rectal: normal tone, normal sized prostate, no masses or tenderness. Due to obesity- somewhat hard to reach  Assessment/Plan:  56 y.o. male presenting for annual physical.  Health Maintenance counseling: 1. Anticipatory guidance: Patient counseled regarding regular dental exams q6 months, eye exams - yearly, wearing seatbelts.  2. Risk factor reduction:  Advised patient of need for regular exercise and diet rich and fruits and vegetables to reduce risk of heart attack and stroke. Exercise- has been down on exercise- restarted last week after seeing his labs. Diet-diet slipped during busy season at work.  Wt Readings from Last 3 Encounters:  08/03/16 290 lb 3.2 oz (131.6 kg)  04/02/16 287 lb 3.2 oz (130.3 kg)  12/02/15 291 lb (132 kg)  3. Immunizations/screenings/ancillary studies- offered shingrix and given Immunization History  Administered Date(s) Administered  . Influenza Split 02/04/2011, 12/15/2011  . Influenza Whole 03/14/2009  . Influenza,inj,Quad PF,36+ Mos 01/26/2013, 12/28/2013, 03/04/2015,  12/02/2015  . Pneumococcal Polysaccharide-23 03/04/2015  . Td 03/29/2006  . Tdap 04/02/2016  4. Prostate cancer screening-  low risk rectal exam. Will need to trend psa next year.   Lab Results  Component Value Date   PSA 0.81 07/27/2016   PSA 0.46 01/27/2010   5. Colon cancer screening - 05/01/13 with 5 year follow up 6. Skin cancer screening- advised regular sunscreen use- actually does a really good job on this. Refer back to dermatology with melanoma history- states last seen perhaps 4 years ago.   Status of chronic or acute concerns     GERd- stable on protonix  Depression- well controlled under care of Dr. Kaur on above stated meds  Type 2 diabetes mellitus (HCC) S: mild poorly controlled. On novolog 70/30 31 units BID, victoza 1.8mg, janumet XR 100-1000mg CBGs- fasting before restarted exercise was 160-170. Restarted exercise and down to 140-150 Exercise and diet-weight down 4 lbs last visit, now crept back up Lab Results  Component Value Date   HGBA1C 7.9 (H) 07/27/2016   HGBA1C 7.1 04/02/2016   HGBA1C 7.9 (H) 12/02/2015   A/P: change janumet to 50-1000mg twice a day instead of 50-1000mg once a day. He is restarting exercise. Follow up 14 weeks- suspect will improve drastically  Hyperlipidemia S: reasonably controlled on crestor 10mg- triglycerides would be ideally below 200.   Lab Results  Component Value Date   CHOL 167 07/27/2016   HDL 56.90 07/27/2016   LDLCALC 35 02/04/2011   LDLDIRECT 67.0 07/27/2016   TRIG 271.0 (H) 07/27/2016   CHOLHDL 3 07/27/2016   A/P: well controlled. Work on weight for triglycerides  Essential hypertension S: controlled on amlodipine- benazepril 5-20mg.  BP Readings from Last 3 Encounters:  08/03/16 138/82  04/02/16 130/80  12/02/15 138/78  A/P:Continue current meds:  Better on repeat- initial over 160  Return in about 14 weeks (around 11/09/2016) for follow up- or sooner if needed. 2nd shingrix at that time  Orders Placed This  Encounter  Procedures  . Ambulatory referral to Dermatology    Referral Priority:   Routine    Referral Type:   Consultation    Referral Reason:   Specialty Services Required    Requested Specialty:   Dermatology    Number of Visits Requested:   1   Shingrix #1 today Meds ordered this encounter  Medications  . SitaGLIPtin-MetFORMIN HCl 50-1000 MG TB24    Sig: Take one tablet by mouth twice a day    Dispense:  180 tablet    Refill:  3   Return precautions advised.  Stephen Hunter, MD   

## 2016-08-03 NOTE — Assessment & Plan Note (Signed)
S: controlled on amlodipine- benazepril 5-20mg .  BP Readings from Last 3 Encounters:  08/03/16 138/82  04/02/16 130/80  12/02/15 138/78  A/P:Continue current meds:  Better on repeat- initial over 160

## 2016-08-03 NOTE — Patient Instructions (Addendum)
change janumet to 50-1000mg  twice a day instead of 50-1000mg  once a day. He is restarting exercise. Follow up 14 weeks- suspect will improve drastically. Hopeful work stress can calm down allowing him more time to work on exercise and prepare meals as well  shingrix first dose today. Will give 2nd dose at next diabetes check

## 2016-10-08 ENCOUNTER — Ambulatory Visit (INDEPENDENT_AMBULATORY_CARE_PROVIDER_SITE_OTHER): Payer: 59 | Admitting: *Deleted

## 2016-10-08 DIAGNOSIS — Z23 Encounter for immunization: Secondary | ICD-10-CM | POA: Diagnosis not present

## 2016-10-08 NOTE — Progress Notes (Signed)
Patient here for second shingrix vaccine, administered IM to left deltoid; patient tolerated well; no s/s of reactions. Patient was educated on possible side effects; understanding voiced.

## 2016-10-16 ENCOUNTER — Encounter: Payer: Self-pay | Admitting: Family Medicine

## 2016-10-17 ENCOUNTER — Other Ambulatory Visit: Payer: Self-pay | Admitting: Family Medicine

## 2016-11-09 ENCOUNTER — Encounter: Payer: Self-pay | Admitting: Family Medicine

## 2016-11-09 ENCOUNTER — Ambulatory Visit (INDEPENDENT_AMBULATORY_CARE_PROVIDER_SITE_OTHER): Payer: 59 | Admitting: Family Medicine

## 2016-11-09 VITALS — BP 136/82 | HR 77 | Temp 98.0°F | Ht 69.0 in | Wt 275.2 lb

## 2016-11-09 DIAGNOSIS — E119 Type 2 diabetes mellitus without complications: Secondary | ICD-10-CM | POA: Diagnosis not present

## 2016-11-09 DIAGNOSIS — I10 Essential (primary) hypertension: Secondary | ICD-10-CM

## 2016-11-09 DIAGNOSIS — Z794 Long term (current) use of insulin: Secondary | ICD-10-CM

## 2016-11-09 LAB — POCT GLYCOSYLATED HEMOGLOBIN (HGB A1C): HEMOGLOBIN A1C: 6

## 2016-11-09 NOTE — Assessment & Plan Note (Signed)
S: poorly controlled. On last check. We changed from janumet 100-1000mg  daily to janumet 50-1000mg  BID. He was to restart exercise. Still on novolog 70/30 31 units BID, victoza.  Morning sugars 130s to 140s instead of 140s or 150s.   Weight down 15 lbs! Walking about 10k steps a day- big increase. Eating smaller portion size.  Lab Results  Component Value Date   HGBA1C 7.9 (H) 07/27/2016   HGBA1C 7.1 04/02/2016   HGBA1C 7.9 (H) 12/02/2015   A/P: a1c with drastic improvement to 6.0 today! Congratulated patient on effort- continue current medicines. No lows- should alert [place] to any CBGs under 70.

## 2016-11-09 NOTE — Progress Notes (Signed)
Subjective:  [name] is a 56 y.o. year old very pleasant male patient who presents for/with See problem oriented charting ROS- no hypoglycemia, no chest pain or shortness of breath   Past Medical History-  Patient Active Problem List   Diagnosis Date Noted  . Morbid obesity ([place]) 12/13/2008    Priority: High  . Type 2 diabetes mellitus ([place]) 03/05/2008    Priority: High  . History of melanoma. R ear.  12/16/2006    Priority: High  . Essential hypertension 03/04/2015    Priority: Medium  . Depression 12/16/2006    Priority: Medium  . Hyperlipidemia 11/07/2006    Priority: Medium  . Eczema 12/02/2015    Priority: Low  . GERD (gastroesophageal reflux disease) 03/04/2015    Priority: Low    Medications- reviewed and updated Current Outpatient Prescriptions  Medication Sig Dispense Refill  . amLODipine-benazepril (LOTREL) 5-20 MG capsule Take 1 capsule by mouth daily. 90 capsule 2  . ARIPiprazole (ABILIFY) 5 MG tablet Take 1 tablet (5 mg total) by mouth daily. 90 tablet 3  . Blood Glucose Monitoring Suppl (ONETOUCH VERIO FLEX SYSTEM) w/Device KIT 1 each by Does not apply route 3 (three) times daily. 1 kit 0  . buPROPion (WELLBUTRIN XL) 150 MG 24 hr tablet Take 1 tablet (150 mg total) by mouth daily. Take 3 tablets by mouth once daily 90 tablet 3  . desvenlafaxine (PRISTIQ) 50 MG 24 hr tablet Take 1 tablet (50 mg total) by mouth daily. 90 tablet 3  . glucose blood (ONETOUCH VERIO) test strip USE TO CHECK BLOOD SUGAR THREE TIMES A DAY AND PRN 100 each 5  . insulin NPH-regular Human (NOVOLIN 70/30) (70-30) 100 UNIT/ML injection INJECT 30 UNITS UNDER THE SKIN BEFORE BREAKFAST AND DINNER, IF YOU SKIP A MEAL DO NOT TAKE THE INSULIN 60 mL 3  . liraglutide (VICTOZA) 18 MG/3ML SOPN Inject subcutaneously 1.'8mg'$  daily 27 mL 6  . ONETOUCH DELICA LANCETS 93O MISC USE TO CHECK BLOOD SUGAR THREE TIMES A DAY AND PRN 100 each 5  . pantoprazole (PROTONIX) 40 MG tablet TAKE 1 TABLET DAILY 90  tablet 0  . rosuvastatin (CRESTOR) 10 MG tablet TAKE 1 TABLET DAILY 90 tablet 1  . SitaGLIPtin-MetFORMIN HCl 50-1000 MG TB24 Take one tablet by mouth twice a day 180 tablet 3  . triamcinolone cream (KENALOG) 0.1 % Apply 1 application topically 2 (two) times daily. For 7-10 days maximum before at least 10 day break 30 g 0   Objective: BP 136/82 (BP Location: Left Arm, Cuff Size: Large)   Pulse 77   Temp 98 F (36.7 C) (Oral)   Ht '5\' 9"'$  (1.753 m)   Wt 275 lb 3.2 oz (124.8 kg)   SpO2 95%   BMI 40.64 kg/m  Gen: NAD, resting comfortably CV: RRR no murmurs rubs or gallops Lungs: CTAB no crackles, wheeze, rhonchi Abdomen: soft/nontender/nondistended/normal bowel sounds. Obese but improved Ext: no edema Skin: warm, dry  Assessment/Plan:  Essential hypertension S: controlled on repeat on amlodipine- benazepril 5-'20mg'$ .  BP Readings from Last 3 Encounters:  11/09/16 136/82  08/03/16 138/82  04/02/16 130/80  A/P: We discussed blood pressure goal of <140/90. Continue current meds:  Also continue weight loss efforts  Type 2 diabetes mellitus ([place]) S: poorly controlled. On last check. We changed from janumet 100-'1000mg'$  daily to janumet 50-'1000mg'$  BID. He was to restart exercise. Still on novolog 70/30 31 units BID, victoza.  Morning sugars 130s to 140s instead of 140s or 150s.  Weight down 15 lbs! Walking about 10k steps a day- big increase. Eating smaller portion size.  Lab Results  Component Value Date   HGBA1C 7.9 (H) 07/27/2016   HGBA1C 7.1 04/02/2016   HGBA1C 7.9 (H) 12/02/2015   A/P: a1c with drastic improvement to 6.0 today! Congratulated patient on effort- continue current medicines. No lows- should alert [place] to any CBGs under 70.   Lab Results  Component Value Date   HGBA1C 6.0 11/09/2016   4 month follow up  Orders Placed This Encounter  Procedures  . POCT glycosylated hemoglobin (Hb A1C)   Return precautions advised.  Garret Reddish, MD

## 2016-11-09 NOTE — Patient Instructions (Addendum)
Lab Results  Component Value Date   HGBA1C 6.0 11/09/2016  GREAT JOB!   Keep up your diligent efforts- we may be able to reduce medicine in the future  Get your flu shot in the fall

## 2016-11-09 NOTE — Assessment & Plan Note (Signed)
S: controlled on repeat on amlodipine- benazepril 5-20mg .  BP Readings from Last 3 Encounters:  11/09/16 136/82  08/03/16 138/82  04/02/16 130/80  A/P: We discussed blood pressure goal of <140/90. Continue current meds:  Also continue weight loss efforts

## 2017-01-27 ENCOUNTER — Other Ambulatory Visit: Payer: Self-pay | Admitting: Family Medicine

## 2017-03-08 ENCOUNTER — Ambulatory Visit: Payer: 59 | Admitting: Family Medicine

## 2017-03-30 ENCOUNTER — Ambulatory Visit: Payer: 59 | Admitting: Family Medicine

## 2017-03-30 ENCOUNTER — Encounter: Payer: Self-pay | Admitting: Family Medicine

## 2017-03-30 VITALS — BP 134/86 | HR 75 | Temp 97.8°F | Ht 69.0 in | Wt 268.0 lb

## 2017-03-30 DIAGNOSIS — E785 Hyperlipidemia, unspecified: Secondary | ICD-10-CM

## 2017-03-30 DIAGNOSIS — Z794 Long term (current) use of insulin: Secondary | ICD-10-CM

## 2017-03-30 DIAGNOSIS — E119 Type 2 diabetes mellitus without complications: Secondary | ICD-10-CM | POA: Diagnosis not present

## 2017-03-30 DIAGNOSIS — I10 Essential (primary) hypertension: Secondary | ICD-10-CM

## 2017-03-30 DIAGNOSIS — F3342 Major depressive disorder, recurrent, in full remission: Secondary | ICD-10-CM | POA: Diagnosis not present

## 2017-03-30 DIAGNOSIS — Z23 Encounter for immunization: Secondary | ICD-10-CM | POA: Diagnosis not present

## 2017-03-30 LAB — COMPREHENSIVE METABOLIC PANEL
ALBUMIN: 4.4 g/dL (ref 3.5–5.2)
ALT: 36 U/L (ref 0–53)
AST: 24 U/L (ref 0–37)
Alkaline Phosphatase: 59 U/L (ref 39–117)
BUN: 16 mg/dL (ref 6–23)
CHLORIDE: 99 meq/L (ref 96–112)
CO2: 29 meq/L (ref 19–32)
Calcium: 9.4 mg/dL (ref 8.4–10.5)
Creatinine, Ser: 0.97 mg/dL (ref 0.40–1.50)
GFR: 84.69 mL/min (ref >60.00)
Glucose, Bld: 82 mg/dL (ref 70–99)
POTASSIUM: 4.2 meq/L (ref 3.5–5.1)
SODIUM: 138 meq/L (ref 135–145)
Total Bilirubin: 0.4 mg/dL (ref 0.2–1.2)
Total Protein: 7.3 g/dL (ref 6.0–8.3)

## 2017-03-30 LAB — HEMOGLOBIN A1C: Hgb A1c MFr Bld: 5.8 % (ref 4.6–6.5)

## 2017-03-30 NOTE — Assessment & Plan Note (Signed)
S: doing well with pristiq 50mg , abilify 5mg  and wellbutrin 150mg  - 3 each day. Sees. Dr. Toy Care once a year- not in network so we now prescribe medicine. phq9 of 2 with no SI or anhedonia.  A/P: Continue current medication

## 2017-03-30 NOTE — Progress Notes (Signed)
Subjective:  [name] is a 57 y.o. year old very pleasant male patient who presents for/with See problem oriented charting ROS-has had intentional weight loss.  No hypoglycemia.  No chest pain or shortness of breath.  Past Medical History-  Patient Active Problem List   Diagnosis Date Noted  . Morbid obesity ([place]) 12/13/2008    Priority: High  . Type 2 diabetes mellitus ([place]) 03/05/2008    Priority: High  . History of melanoma. R ear.  12/16/2006    Priority: High  . Essential hypertension 03/04/2015    Priority: Medium  . Major depression, recurrent, full remission ([place]) 12/16/2006    Priority: Medium  . Hyperlipidemia 11/07/2006    Priority: Medium  . Eczema 12/02/2015    Priority: Low  . GERD (gastroesophageal reflux disease) 03/04/2015    Priority: Low    Medications- reviewed and updated Current Outpatient Medications  Medication Sig Dispense Refill  . amLODipine-benazepril (LOTREL) 5-20 MG capsule TAKE 1 CAPSULE DAILY 90 capsule 1  . ARIPiprazole (ABILIFY) 5 MG tablet Take 1 tablet (5 mg total) by mouth daily. 90 tablet 3  . Blood Glucose Monitoring Suppl (ONETOUCH VERIO FLEX SYSTEM) w/Device KIT 1 each by Does not apply route 3 (three) times daily. 1 kit 0  . buPROPion (WELLBUTRIN XL) 150 MG 24 hr tablet Take 1 tablet (150 mg total) by mouth daily. Take 3 tablets by mouth once daily 90 tablet 3  . desvenlafaxine (PRISTIQ) 50 MG 24 hr tablet Take 1 tablet (50 mg total) by mouth daily. 90 tablet 3  . glucose blood (ONETOUCH VERIO) test strip USE TO CHECK BLOOD SUGAR THREE TIMES A DAY AND PRN 100 each 5  . insulin NPH-regular Human (NOVOLIN 70/30) (70-30) 100 UNIT/ML injection INJECT 30 UNITS UNDER THE SKIN BEFORE BREAKFAST AND DINNER, IF YOU SKIP A MEAL DO NOT TAKE THE INSULIN 60 mL 3  . liraglutide (VICTOZA) 18 MG/3ML SOPN Inject subcutaneously 1.'8mg'$  daily 27 mL 6  . ONETOUCH DELICA LANCETS 95J MISC USE TO CHECK BLOOD SUGAR THREE TIMES A DAY AND PRN 100 each 5  .  pantoprazole (PROTONIX) 40 MG tablet TAKE 1 TABLET DAILY 90 tablet 1  . rosuvastatin (CRESTOR) 10 MG tablet TAKE 1 TABLET DAILY 90 tablet 1  . SitaGLIPtin-MetFORMIN HCl 50-1000 MG TB24 Take one tablet by mouth twice a day 180 tablet 3  . triamcinolone cream (KENALOG) 0.1 % Apply 1 application topically 2 (two) times daily. For 7-10 days maximum before at least 10 day break 30 g 0    Objective: BP 134/86 (BP Location: Left Arm, Patient Position: Sitting, Cuff Size: Large)   Pulse 75   Temp 97.8 F (36.6 C) (Oral)   Ht '5\' 9"'$  (1.753 m)   Wt 268 lb (121.6 kg)   SpO2 96%   BMI 39.58 kg/m  Gen: NAD, resting comfortably Evidence of prior melanoma surgeries CV: RRR no murmurs rubs or gallops Lungs: CTAB no crackles, wheeze, rhonchi Abdomen: soft/nontender/nondistended/normal bowel sounds. obese Ext: no edema Skin: warm, dry  Assessment/Plan:  Hypertension  s: controlled on amlodipine -benazepril 5-'20mg'$  BP Readings from Last 3 Encounters:  03/30/17 134/86  11/09/16 136/82  08/03/16 138/82  A/P:blood pressure goal of <140/90. Continue current meds:  Doing well. Work on weight loss  Hyperlipidemia  s:  controlled on crestor '10mg'$ . No myalgias.  Lab Results  Component Value Date   CHOL 167 07/27/2016   HDL 56.90 07/27/2016   LDLCALC 35 02/04/2011   LDLDIRECT 67.0 07/27/2016  TRIG 271.0 (H) 07/27/2016   CHOLHDL 3 07/27/2016   A/P: last LDL was 67 which is at goal under 70- no changes.  Continue to work on weight loss  Type 2 diabetes mellitus ([place]) S: very well controlled at last visit On janumet 50-'1000mg'$  BID as well as novolog 70/30 units 31 units BID and victoza CBGs- 130s for most part in AMs before breakfast. No low blood sugars Exercise and diet- 10 K steps most days. Weight down another 7 lbs!!!! Lab Results  Component Value Date   HGBA1C 6.0 11/09/2016   HGBA1C 7.9 (H) 07/27/2016   HGBA1C 7.1 04/02/2016   A/P: update a1c and cmp today- has been doing well and suspect  a1c at least under 7.5.   Major depression, recurrent, full remission ([place]) S: doing well with pristiq '50mg'$ , abilify '5mg'$  and wellbutrin '150mg'$  - 3 each day. Sees. Dr. Toy Care once a year- not in network so we now prescribe medicine. phq9 of 2 with no SI or anhedonia.  A/P: Continue current medication  Future Appointments  Date Time Provider [place]  08/04/2017 10:45 AM Marin Olp, MD [facility]   Return in about 18 weeks (around 08/03/2017) for physical.  Orders Placed This Encounter  Procedures  . Flu Vaccine QUAD 36+ mos IM  . Comprehensive metabolic panel    [place]  . Hemoglobin A1c    [place]   Return precautions advised.  Garret Reddish, MD

## 2017-03-30 NOTE — Assessment & Plan Note (Signed)
S: very well controlled at last visit On janumet 50-1000mg  BID as well as novolog 70/30 units 31 units BID and victoza CBGs- 130s for most part in AMs before breakfast. No low blood sugars Exercise and diet- 10 K steps most days. Weight down another 7 lbs!!!! Lab Results  Component Value Date   HGBA1C 6.0 11/09/2016   HGBA1C 7.9 (H) 07/27/2016   HGBA1C 7.1 04/02/2016   A/P: update a1c and cmp today- has been doing well and suspect a1c at least under 7.5.

## 2017-03-30 NOTE — Patient Instructions (Addendum)
Please stop by lab before you go  Glad you are doing so well! Great job on weight loss- keep it up.   If you start getting any sugars under 70 let me know immediately.

## 2017-04-06 ENCOUNTER — Other Ambulatory Visit: Payer: Self-pay | Admitting: Family Medicine

## 2017-04-21 ENCOUNTER — Telehealth: Payer: Self-pay | Admitting: Family Medicine

## 2017-04-21 ENCOUNTER — Other Ambulatory Visit: Payer: Self-pay | Admitting: Family Medicine

## 2017-04-21 MED ORDER — DESVENLAFAXINE SUCCINATE ER 50 MG PO TB24: 50.0000 mg | ORAL_TABLET | Freq: Every day | ORAL | 3 refills | Status: DC

## 2017-04-21 MED ORDER — BUPROPION HCL ER (XL) 150 MG PO TB24: 150.0000 mg | ORAL_TABLET | Freq: Every day | ORAL | 3 refills | Status: DC

## 2017-04-21 MED ORDER — LIRAGLUTIDE 18 MG/3ML ~~LOC~~ SOPN: PEN_INJECTOR | SUBCUTANEOUS | 6 refills | Status: DC

## 2017-04-21 NOTE — Telephone Encounter (Signed)
Copied from [place] [phone]. Topic: Quick Communication - Rx Refill/Question >> Apr 21, 2017  3:10 PM Corie Chiquito, [place] wrote: Medication: Desvenlafaxine, Bupropion, and Victoza   Has the patient contacted their pharmacy? Yes  Patient has been in contact with his pharmacy and was told to contact his doctors office due to the fact that the prescriptions have expired.   Preferred Pharmacy (with phone number or street name): [facility] [place] [address] [phone]   Agent: Please be advised that RX refills may take up to 3 business days. We ask that you follow-up with your pharmacy.

## 2017-04-25 ENCOUNTER — Other Ambulatory Visit: Payer: Self-pay

## 2017-04-25 MED ORDER — BUPROPION HCL ER (XL) 150 MG PO TB24: 150.0000 mg | ORAL_TABLET | Freq: Every day | ORAL | 2 refills | Status: DC

## 2017-04-26 ENCOUNTER — Telehealth: Payer: Self-pay | Admitting: Family Medicine

## 2017-04-26 NOTE — Telephone Encounter (Signed)
See note

## 2017-04-26 NOTE — Telephone Encounter (Signed)
Copied from [place] [id number]. Topic: General - Other >> Apr 26, 2017  4:01 PM Neva Seat wrote: Bupropion 150 mg  [facility] saying [facility] is a question in the refills for pt's Rx.  Please call pharmacy to discuss.  [facility] [place] [facility] [address] [place] [address] Phone: [phone] Fax: [phone]

## 2017-04-26 NOTE — Telephone Encounter (Signed)
Please advise 

## 2017-04-26 NOTE — Telephone Encounter (Signed)
[facility] called to verify how pt. Is taking his Wellbutrin. Documented in chart he takes 1 pill daily and 3 pills daily. Unable to reach pt. By phone to verify how he takes it.

## 2017-04-27 ENCOUNTER — Other Ambulatory Visit: Payer: Self-pay

## 2017-04-27 LAB — HM DIABETES EYE EXAM

## 2017-04-27 MED ORDER — BUPROPION HCL ER (XL) 150 MG PO TB24: 450.0000 mg | ORAL_TABLET | Freq: Every day | ORAL | 2 refills | Status: DC

## 2017-04-27 NOTE — Telephone Encounter (Signed)
Patient takes 3 tablets per day.  Corrected Rx sent to [facility].

## 2017-04-27 NOTE — Telephone Encounter (Signed)
See other message.  This has been handled.

## 2017-05-10 ENCOUNTER — Encounter: Payer: Self-pay | Admitting: Family Medicine

## 2017-06-24 ENCOUNTER — Other Ambulatory Visit: Payer: Self-pay | Admitting: Family Medicine

## 2017-08-02 ENCOUNTER — Other Ambulatory Visit: Payer: Self-pay

## 2017-08-04 ENCOUNTER — Encounter: Payer: Self-pay | Admitting: Family Medicine

## 2017-08-04 ENCOUNTER — Ambulatory Visit: Payer: 59 | Admitting: Family Medicine

## 2017-08-04 VITALS — BP 132/80 | HR 79 | Temp 98.3°F | Ht 69.0 in | Wt 277.4 lb

## 2017-08-04 DIAGNOSIS — K219 Gastro-esophageal reflux disease without esophagitis: Secondary | ICD-10-CM | POA: Diagnosis not present

## 2017-08-04 DIAGNOSIS — E119 Type 2 diabetes mellitus without complications: Secondary | ICD-10-CM | POA: Diagnosis not present

## 2017-08-04 DIAGNOSIS — E785 Hyperlipidemia, unspecified: Secondary | ICD-10-CM | POA: Diagnosis not present

## 2017-08-04 DIAGNOSIS — I10 Essential (primary) hypertension: Secondary | ICD-10-CM

## 2017-08-04 DIAGNOSIS — Z79899 Other long term (current) drug therapy: Secondary | ICD-10-CM

## 2017-08-04 DIAGNOSIS — Z794 Long term (current) use of insulin: Secondary | ICD-10-CM

## 2017-08-04 LAB — HEMOGLOBIN A1C: Hgb A1c MFr Bld: 5.9 % (ref 4.6–6.5)

## 2017-08-04 LAB — LDL CHOLESTEROL, DIRECT: Direct LDL: 58 mg/dL

## 2017-08-04 LAB — VITAMIN B12: Vitamin B-12: 279 pg/mL (ref 211–911)

## 2017-08-04 MED ORDER — PANTOPRAZOLE SODIUM 20 MG PO TBEC: 20.0000 mg | DELAYED_RELEASE_TABLET | Freq: Every day | ORAL | 3 refills | Status: DC

## 2017-08-04 NOTE — Patient Instructions (Addendum)
Health Maintenance Due  Topic Date Due  . FOOT EXAM - Today at Office Visit 04/02/2017   Please stop by lab before you go  No changes today (other than trying protonix 20mg  to see if this works- we can call 40mg  back in if you have reflux after a week of taking)  unless labs lead [place] to make changes such as increasing insulin

## 2017-08-04 NOTE — Assessment & Plan Note (Signed)
S: Controlled on Crestor 10 mg.  Lab Results  Component Value Date   CHOL 167 07/27/2016   HDL 56.90 07/27/2016   LDLCALC 35 02/04/2011   LDLDIRECT 67.0 07/27/2016   TRIG 271.0 (H) 07/27/2016   CHOLHDL 3 07/27/2016   A/P:  Ideally would get triglycerides under 200-continue to push for weight loss, regular exercise, healthy eating. Will update LDL to make sure at least under 100

## 2017-08-04 NOTE — Assessment & Plan Note (Signed)
S: Patient continues on Protonix 40 mg. A/P: We discussed possible trial of 20 mg.  We will also check a B12 level given long-term use of PPI and metformin

## 2017-08-04 NOTE — Progress Notes (Signed)
Please let us know by responding to this when you get this mychart message and let us know if you understand the results/directions  Great news-your hemoglobin A1c remains well controlled at 5.9 Up only slightly from 5.8.  Your cholesterol Looks great with bad cholesterol under 70 at 58.  Your B12 is normal but is on the low end of normal.  I would consider taking a multivitamin with B12 in it.  Or simply a B12 supplement daily.

## 2017-08-04 NOTE — Assessment & Plan Note (Signed)
S: controlled on amlodipine-benazepril 5-20 mg BP Readings from Last 3 Encounters:  08/04/17 132/80  03/30/17 134/86  11/09/16 136/82  A/P: We discussed blood pressure goal of <140/90. Continue current meds

## 2017-08-04 NOTE — Progress Notes (Signed)
Subjective:  [name] is a 57 y.o. year old very pleasant male patient who presents for/with See problem oriented charting ROS- No chest pain or shortness of breath. No headache or blurry vision.    Past Medical History-  Patient Active Problem List   Diagnosis Date Noted  . Morbid obesity ([place]) 12/13/2008    Priority: High  . Type 2 diabetes mellitus ([place]) 03/05/2008    Priority: High  . History of melanoma. R ear.  12/16/2006    Priority: High  . Essential hypertension 03/04/2015    Priority: Medium  . GERD (gastroesophageal reflux disease) 03/04/2015    Priority: Medium  . Major depression, recurrent, full remission ([place]) 12/16/2006    Priority: Medium  . Hyperlipidemia 11/07/2006    Priority: Medium  . Eczema 12/02/2015    Priority: Low    Medications- reviewed and updated Current Outpatient Medications  Medication Sig Dispense Refill  . amLODipine-benazepril (LOTREL) 5-20 MG capsule TAKE 1 CAPSULE DAILY 90 capsule 1  . ARIPiprazole (ABILIFY) 5 MG tablet TAKE 1 TABLET DAILY 90 tablet 3  . Blood Glucose Monitoring Suppl (ONETOUCH VERIO FLEX SYSTEM) w/Device KIT 1 each by Does not apply route 3 (three) times daily. 1 kit 0  . buPROPion (WELLBUTRIN XL) 150 MG 24 hr tablet Take 3 tablets (450 mg total) by mouth daily. 270 tablet 2  . desvenlafaxine (PRISTIQ) 50 MG 24 hr tablet Take 1 tablet (50 mg total) by mouth daily. 90 tablet 3  . glucose blood (ONETOUCH VERIO) test strip USE TO CHECK BLOOD SUGAR THREE TIMES A DAY AND PRN 100 each 5  . insulin NPH-regular Human (NOVOLIN 70/30) (70-30) 100 UNIT/ML injection FOR DIRECTIONS ON HOW TO   TAKE THIS MEDICINE, READ   THE ENCLOSED MEDICATION    INFORMATION FORM 60 mL 3  . liraglutide (VICTOZA) 18 MG/3ML SOPN Inject subcutaneously 1.'8mg'$  daily 27 mL 6  . ONETOUCH DELICA LANCETS 25K MISC USE TO CHECK BLOOD SUGAR THREE TIMES A DAY AND PRN 100 each 5  . pantoprazole (PROTONIX) 20 MG tablet Take 1 tablet (20 mg total) by mouth daily.  90 tablet 3  . rosuvastatin (CRESTOR) 10 MG tablet TAKE 1 TABLET DAILY 90 tablet 1  . SitaGLIPtin-MetFORMIN HCl 50-1000 MG TB24 Take one tablet by mouth twice a day 180 tablet 3  . triamcinolone cream (KENALOG) 0.1 % Apply 1 application topically 2 (two) times daily. For 7-10 days maximum before at least 10 day break 30 g 0   No current facility-administered medications for this visit.     Objective: BP 132/80 (BP Location: Left Arm, Patient Position: Sitting, Cuff Size: Normal)   Pulse 79   Temp 98.3 F (36.8 C) (Oral)   Ht '5\' 9"'$  (1.753 m)   Wt 277 lb 6.4 oz (125.8 kg)   SpO2 94%   BMI 40.96 kg/m  Gen: NAD, resting comfortably CV: RRR no murmurs rubs or gallops Lungs: CTAB no crackles, wheeze, rhonchi Abdomen: soft/nontender/nondistended/normal bowel sounds. No rebound or guarding.  Ext: trace edema Skin: warm, dry Neuro: normal speech  Diabetic Foot Exam - Simple   Simple Foot Form Diabetic Foot exam was performed with the following findings:  Yes 08/04/2017 11:17 AM  Visual Inspection No deformities, no ulcerations, no other skin breakdown bilaterally:  Yes Sensation Testing Intact to touch and monofilament testing bilaterally:  Yes Pulse Check Posterior Tibialis and Dorsalis pulse intact bilaterally:  Yes Comments     Assessment/Plan:  Type 2 diabetes mellitus ([place]) S:  controlled on Janumet 50-1000 mg twice daily.  He is also on NovoLog 70/30 with 31 units twice daily.  Also takes Victoza daily CBGs- 140s to 150s in morning. denies low blood sugars- lowest he has seen is 132 133 Exercise and diet- patient's weight has swung back up over the last 4 months now up 9 pounds.  Had been doing 10K steps a day- exercise has dropped off due to heavy workload- just now starting back up Lab Results  Component Value Date   HGBA1C 5.8 03/30/2017   HGBA1C 6.0 11/09/2016   HGBA1C 7.9 (H) 07/27/2016   A/P: update a1c  Essential hypertension S: controlled on  amlodipine-benazepril 5-20 mg BP Readings from Last 3 Encounters:  08/04/17 132/80  03/30/17 134/86  11/09/16 136/82  A/P: We discussed blood pressure goal of <140/90. Continue current meds  Hyperlipidemia S: Controlled on Crestor 10 mg.  Lab Results  Component Value Date   CHOL 167 07/27/2016   HDL 56.90 07/27/2016   LDLCALC 35 02/04/2011   LDLDIRECT 67.0 07/27/2016   TRIG 271.0 (H) 07/27/2016   CHOLHDL 3 07/27/2016   A/P:  Ideally would get triglycerides under 200-continue to push for weight loss, regular exercise, healthy eating. Will update LDL to make sure at least under 100  GERD (gastroesophageal reflux disease) S: Patient continues on Protonix 40 mg. A/P: We discussed possible trial of 20 mg.  We will also check a B12 level given long-term use of PPI and metformin   No future appointments. Return in about 4 months (around 12/05/2017) for physical. come fasting. .  Lab/Order associations: Type 2 diabetes mellitus without complication, with long-term current use of insulin (HCC) - Plan: Hemoglobin A1c, Vitamin B12, LDL cholesterol, direct Hyperlipidemia, unspecified hyperlipidemia type - Plan: LDL cholesterol, direct  Gastroesophageal reflux disease without esophagitis - Plan: Vitamin B12  Meds ordered this encounter  Medications  . pantoprazole (PROTONIX) 20 MG tablet    Sig: Take 1 tablet (20 mg total) by mouth daily.    Dispense:  90 tablet    Refill:  3   Return precautions advised.  Garret Reddish, MD

## 2017-08-04 NOTE — Assessment & Plan Note (Signed)
S:  controlled on Janumet 50-1000 mg twice daily.  He is also on NovoLog 70/30 with 31 units twice daily.  Also takes Victoza daily CBGs- 140s to 150s in morning. denies low blood sugars- lowest he has seen is 132 133 Exercise and diet- patient's weight has swung back up over the last 4 months now up 9 pounds.  Had been doing 10K steps a day- exercise has dropped off due to heavy workload- just now starting back up Lab Results  Component Value Date   HGBA1C 5.8 03/30/2017   HGBA1C 6.0 11/09/2016   HGBA1C 7.9 (H) 07/27/2016   A/P: update a1c

## 2017-08-05 ENCOUNTER — Telehealth: Payer: Self-pay

## 2017-08-05 NOTE — Telephone Encounter (Signed)
Called patient and left a message to call office back.  

## 2017-08-08 ENCOUNTER — Other Ambulatory Visit: Payer: Self-pay | Admitting: Family Medicine

## 2017-11-07 ENCOUNTER — Other Ambulatory Visit: Payer: Self-pay

## 2017-11-07 MED ORDER — ROSUVASTATIN CALCIUM 10 MG PO TABS: 10.0000 mg | ORAL_TABLET | Freq: Every day | ORAL | 1 refills | Status: DC

## 2017-12-08 ENCOUNTER — Encounter: Payer: Self-pay | Admitting: Family Medicine

## 2017-12-08 ENCOUNTER — Ambulatory Visit (INDEPENDENT_AMBULATORY_CARE_PROVIDER_SITE_OTHER): Payer: 59 | Admitting: Family Medicine

## 2017-12-08 VITALS — BP 120/68 | HR 81 | Temp 98.0°F | Ht 69.0 in | Wt 269.2 lb

## 2017-12-08 DIAGNOSIS — E785 Hyperlipidemia, unspecified: Secondary | ICD-10-CM | POA: Diagnosis not present

## 2017-12-08 DIAGNOSIS — Z85820 Personal history of malignant melanoma of skin: Secondary | ICD-10-CM

## 2017-12-08 DIAGNOSIS — Z0000 Encounter for general adult medical examination without abnormal findings: Secondary | ICD-10-CM | POA: Diagnosis not present

## 2017-12-08 DIAGNOSIS — E119 Type 2 diabetes mellitus without complications: Secondary | ICD-10-CM

## 2017-12-08 DIAGNOSIS — Z794 Long term (current) use of insulin: Secondary | ICD-10-CM

## 2017-12-08 DIAGNOSIS — Z23 Encounter for immunization: Secondary | ICD-10-CM | POA: Diagnosis not present

## 2017-12-08 LAB — POC URINALSYSI DIPSTICK (AUTOMATED)
Glucose, UA: NEGATIVE
Ketones, UA: NEGATIVE
LEUKOCYTES UA: NEGATIVE
Nitrite, UA: NEGATIVE
PH UA: 6 (ref 5.0–8.0)
PROTEIN UA: NEGATIVE
Spec Grav, UA: >=1.03 — AB (ref 1.010–1.025)
UROBILINOGEN UA: 0.2 U/dL

## 2017-12-08 LAB — LIPID PANEL
CHOLESTEROL: 134 mg/dL (ref 0–200)
HDL: 55.8 mg/dL (ref >39.00)
LDL Cholesterol: 56 mg/dL (ref 0–99)
NonHDL: 77.99
TRIGLYCERIDES: 109 mg/dL (ref 0.0–149.0)
Total CHOL/HDL Ratio: 2
VLDL: 21.8 mg/dL (ref 0.0–40.0)

## 2017-12-08 LAB — CBC
HCT: 42.4 % (ref 39.0–52.0)
Hemoglobin: 14.3 g/dL (ref 13.0–17.0)
MCHC: 33.7 g/dL (ref 30.0–36.0)
MCV: 86.2 fl (ref 78.0–100.0)
Platelets: 253 10*3/uL (ref 150.0–400.0)
RBC: 4.92 Mil/uL (ref 4.22–5.81)
RDW: 13.7 % (ref 11.5–15.5)
WBC: 8.1 10*3/uL (ref 4.0–10.5)

## 2017-12-08 LAB — COMPREHENSIVE METABOLIC PANEL
ALBUMIN: 4.4 g/dL (ref 3.5–5.2)
ALK PHOS: 42 U/L (ref 39–117)
ALT: 25 U/L (ref 0–53)
AST: 18 U/L (ref 0–37)
BUN: 17 mg/dL (ref 6–23)
CALCIUM: 9.5 mg/dL (ref 8.4–10.5)
CHLORIDE: 103 meq/L (ref 96–112)
CO2: 29 mEq/L (ref 19–32)
CREATININE: 1.07 mg/dL (ref 0.40–1.50)
GFR: 75.44 mL/min (ref >60.00)
Glucose, Bld: 92 mg/dL (ref 70–99)
Potassium: 4.5 mEq/L (ref 3.5–5.1)
Sodium: 140 mEq/L (ref 135–145)
TOTAL PROTEIN: 6.9 g/dL (ref 6.0–8.3)
Total Bilirubin: 0.4 mg/dL (ref 0.2–1.2)

## 2017-12-08 LAB — HEMOGLOBIN A1C: HEMOGLOBIN A1C: 5.7 % (ref 4.6–6.5)

## 2017-12-08 NOTE — Progress Notes (Signed)
Phone: [phone]  Subjective:  Patient presents today for their annual physical. Chief complaint-noted.   See problem oriented charting- ROS- full  review of systems was completed and negative including No chest pain or shortness of breath. No headache or blurry vision. No low blood sugars.   The following were reviewed and entered/updated in epic: Past Medical History:  Diagnosis Date  . Cancer ([place])    melanoma;  right ear  . Diabetes mellitus   . GERD (gastroesophageal reflux disease)   . Hyperlipidemia   . Hypertension   . Metabolic syndrome   . Obesity    Patient Active Problem List   Diagnosis Date Noted  . Morbid obesity ([place]) 12/13/2008    Priority: High  . Type 2 diabetes mellitus ([place]) 03/05/2008    Priority: High  . History of melanoma. R ear.  12/16/2006    Priority: High  . Essential hypertension 03/04/2015    Priority: Medium  . GERD (gastroesophageal reflux disease) 03/04/2015    Priority: Medium  . Major depression, recurrent, full remission ([place]) 12/16/2006    Priority: Medium  . Hyperlipidemia 11/07/2006    Priority: Medium  . Eczema 12/02/2015    Priority: Low   Past Surgical History:  Procedure Laterality Date  . CYST REMOVAL TRUNK  1989   benign  . MELANOMA EXCISION  2008   right ear and neck  . reconstructive plastic surgery face  1967   dog bite at age 69; about 6 surgeries for 10 years  . TONSILLECTOMY  1966   age 39    Family History  Problem Relation Age of Onset  . Arthritis Mother   . Stroke Father 42       heavy smoker 4-5 packs a day unfiltered  . Colon cancer Neg Hx   . Esophageal cancer Neg Hx   . Rectal cancer Neg Hx   . Stomach cancer Neg Hx   . Schizophrenia Father        homeless and did not get medical care in time  . Alcoholism Father     Medications- reviewed and updated Current Outpatient Medications  Medication Sig Dispense Refill  . amLODipine-benazepril (LOTREL) 5-20 MG capsule TAKE 1 CAPSULE DAILY 90  capsule 1  . ARIPiprazole (ABILIFY) 5 MG tablet TAKE 1 TABLET DAILY 90 tablet 3  . Blood Glucose Monitoring Suppl (ONETOUCH VERIO FLEX SYSTEM) w/Device KIT 1 each by Does not apply route 3 (three) times daily. 1 kit 0  . desvenlafaxine (PRISTIQ) 50 MG 24 hr tablet Take 1 tablet (50 mg total) by mouth daily. 90 tablet 3  . glucose blood (ONETOUCH VERIO) test strip USE TO CHECK BLOOD SUGAR THREE TIMES A DAY AND PRN 100 each 5  . insulin NPH-regular Human (NOVOLIN 70/30) (70-30) 100 UNIT/ML injection FOR DIRECTIONS ON HOW TO   TAKE THIS MEDICINE, READ   THE ENCLOSED MEDICATION    INFORMATION FORM 60 mL 3  . JANUMET XR 50-1000 MG TB24 TAKE 1 TABLET TWICE A DAY 180 tablet 3  . liraglutide (VICTOZA) 18 MG/3ML SOPN Inject subcutaneously 1.'8mg'$  daily 27 mL 6  . ONETOUCH DELICA LANCETS 76P MISC USE TO CHECK BLOOD SUGAR THREE TIMES A DAY AND PRN 100 each 5  . pantoprazole (PROTONIX) 20 MG tablet Take 1 tablet (20 mg total) by mouth daily. 90 tablet 3  . rosuvastatin (CRESTOR) 10 MG tablet Take 1 tablet (10 mg total) by mouth daily. 90 tablet 1  . TRINTELLIX 10 MG TABS tablet Take 1  tablet by mouth daily.     No current facility-administered medications for this visit.     Allergies-reviewed and updated Allergies  Allergen Reactions  . Codeine Nausea And Vomiting  . Food Hives    Coconut flakes    Social History   Social History Narrative   Single. 1 dog, 2 cats. Lives alone.       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 Enjoys dinner parties.     Objective: BP 120/68 (BP Location: Left Arm, Patient Position: Sitting, Cuff Size: Large)   Pulse 81   Temp 98 F (36.7 C) (Oral)   Ht '5\' 9"'$  (1.753 m)   Wt 269 lb 3.2 oz (122.1 kg)   SpO2 94%   BMI 39.75 kg/m  Gen: NAD, resting comfortably HEENT: Mucous membranes are moist. Oropharynx normal Neck: no  thyromegaly CV: RRR no murmurs rubs or gallops Lungs: CTAB no crackles, wheeze, rhonchi Abdomen: soft/nontender/nondistended/normal bowel sounds. No rebound or guarding.  Ext: no edema Skin: warm, dry Neuro: grossly normal, moves all extremities, PERRLA Rectal: normal tone, normal sized prostate, no masses or tenderness  Assessment/Plan:  57 y.o. male presenting for annual physical.  Health Maintenance counseling: 1. Anticipatory guidance: Patient counseled regarding regular dental exams -q6 months, eye exams -yearly, wearing seatbelts.  2. Risk factor reduction:  Advised patient of need for regular exercise and diet rich and fruits and vegetables to reduce risk of heart attack and stroke. Exercise- increased walking to help with weight loss. Diet-down 13 lbs from last year- admits to improved diet - does small protein, small starch, more nonstarchy Wt Readings from Last 3 Encounters:  12/08/17 269 lb 3.2 oz (122.1 kg)  08/04/17 277 lb 6.4 oz (125.8 kg)  03/30/17 268 lb (121.6 kg)  3. Immunizations/screenings/ancillary studies Immunization History  Administered Date(s) Administered  . Influenza Split 02/04/2011, 12/15/2011  . Influenza Whole 03/14/2009  . Influenza,inj,Quad PF,6+ Mos 01/26/2013, 12/28/2013, 03/04/2015, 12/02/2015, 03/30/2017, 12/08/2017  . Pneumococcal Polysaccharide-23 03/04/2015  . Td 03/29/2006  . Tdap 04/02/2016  . Zoster Recombinat (Shingrix) 08/03/2016, 10/08/2016   Health Maintenance Due  Topic Date Due  . INFLUENZA VACCINE - today  10/27/2017   4. Prostate cancer screening-  Low risk rectal exam. Will need to trend PSA.  Lab Results  Component Value Date   PSA 0.81 07/27/2016   PSA 0.46 01/27/2010   5. Colon cancer screening -  05/01/13 with 5 year follow up - so due next year 6. Skin cancer screening- have asked him to see dermatology due to melanoma history - he does not recall seeing derm last year- we will refer again. advised regular sunscreen use.  Denies worrisome, changing, or new skin lesions.  7. Former smoker- 2.5 pack years, quit 1990. Will get UA  Status of chronic or acute concerns   DM- on janumet 50-'1000mg'$  BID as well as novolog 70/30 with 31 units BID and victoza daily. Down 8 lbs from last visit!  Lab Results  Component Value Date   HGBA1C 5.9 08/04/2017   HTN- controlled on amlodipine -benazepril 5-'20mg'$    HLD- controlled on crestor '10mg'$ . Hoping we can get triglycerides under 200 with weight loss.   GERD- on protonix '40mg'$  prior- has been able to tolerate '20mg'$ . b12 low normal with metformin and protonix- he is on a multivitamin to try to help.   Depression controlled  under care by Dr. Toy Care on Abilify '5mg'$ , pristiq '50mg'$ , trintellix. This regimen has been helpful to him.   4-6 month follow up  Lab/Order associations: Preventative health care - Plan: CBC, Comprehensive metabolic panel, Lipid panel, Hemoglobin A1c, POCT Urinalysis Dipstick (Automated)  Need for prophylactic vaccination and inoculation against influenza - Plan: Flu Vaccine QUAD 36+ mos IM  History of melanoma. R ear.  - Plan: Ambulatory referral to Dermatology  Type 2 diabetes mellitus without complication, with long-term current use of insulin ([place]) - Plan: CBC, Comprehensive metabolic panel, Lipid panel, Hemoglobin A1c, POCT Urinalysis Dipstick (Automated)  Hyperlipidemia, unspecified hyperlipidemia type - Plan: CBC, Comprehensive metabolic panel, Lipid panel, POCT Urinalysis Dipstick (Automated)  Return precautions advised.  Garret Reddish, MD

## 2017-12-08 NOTE — Patient Instructions (Addendum)
Health Maintenance Due  Topic Date Due  . INFLUENZA VACCINE - had today  10/27/2017   We will call you within two weeks about your referral to dermatology. If you do not hear within 3 weeks, give [place] a call.   Great job on slow steady weight loss! Keep up the great work. Let us know if you have any lows as may need to reduce your diabetes meds  4-6 month follow up  Please stop by lab before you go

## 2017-12-08 NOTE — Addendum Note (Signed)
Addended by: Kayren Eaves T on: 12/08/2017 10:22 AM   Modules accepted: Orders

## 2017-12-12 ENCOUNTER — Other Ambulatory Visit: Payer: Self-pay

## 2017-12-12 DIAGNOSIS — R319 Hematuria, unspecified: Secondary | ICD-10-CM

## 2017-12-14 ENCOUNTER — Other Ambulatory Visit (INDEPENDENT_AMBULATORY_CARE_PROVIDER_SITE_OTHER): Payer: 59

## 2017-12-14 DIAGNOSIS — R319 Hematuria, unspecified: Secondary | ICD-10-CM

## 2017-12-14 LAB — URINALYSIS, ROUTINE W REFLEX MICROSCOPIC
BILIRUBIN URINE: NEGATIVE
KETONES UR: NEGATIVE
Leukocytes, UA: NEGATIVE
NITRITE: NEGATIVE
PH: 7 (ref 5.0–8.0)
RBC / HPF: NONE SEEN (ref 0–?)
Specific Gravity, Urine: 1.015 (ref 1.000–1.030)
Total Protein, Urine: NEGATIVE
UROBILINOGEN UA: 0.2 (ref 0.0–1.0)
Urine Glucose: NEGATIVE

## 2018-02-01 ENCOUNTER — Other Ambulatory Visit: Payer: Self-pay | Admitting: Family Medicine

## 2018-02-27 ENCOUNTER — Telehealth: Payer: Self-pay | Admitting: Family Medicine

## 2018-02-27 NOTE — Telephone Encounter (Signed)
[facility] calling to verify patient should be on Janumet XR and Victoza. Verified per office visit note on 12/08/17 with Dr. Yong Channel.

## 2018-05-05 LAB — HM DIABETES EYE EXAM

## 2018-05-19 ENCOUNTER — Other Ambulatory Visit: Payer: Self-pay | Admitting: Family Medicine

## 2018-05-19 MED ORDER — LIRAGLUTIDE 18 MG/3ML ~~LOC~~ SOPN: PEN_INJECTOR | SUBCUTANEOUS | 1 refills | Status: DC

## 2018-05-19 MED ORDER — AMLODIPINE BESY-BENAZEPRIL HCL 5-20 MG PO CAPS: 1.0000 | ORAL_CAPSULE | Freq: Every day | ORAL | 0 refills | Status: DC

## 2018-05-19 MED ORDER — DESVENLAFAXINE SUCCINATE ER 50 MG PO TB24: 50.0000 mg | ORAL_TABLET | Freq: Every day | ORAL | 0 refills | Status: DC

## 2018-05-19 MED ORDER — ROSUVASTATIN CALCIUM 10 MG PO TABS: 10.0000 mg | ORAL_TABLET | Freq: Every day | ORAL | 0 refills | Status: DC

## 2018-05-19 NOTE — Telephone Encounter (Signed)
See note

## 2018-05-19 NOTE — Telephone Encounter (Signed)
Copied from [place] [id number]. Topic: Quick Communication - Rx Refill/Question >> May 19, 2018  3:10 PM [name] Flavin B, [place] wrote: **Patient calling and states that his insurance requires him to use a new pharmacy. States that the pharmacy was unable to pull these medications from his previous pharmacy. Needs new prescriptions.**   Medication: amLODipine-benazepril (LOTREL) 5-20 MG capsule ARIPiprazole (ABILIFY) 5 MG tablet desvenlafaxine (PRISTIQ) 50 MG 24 hr tablet rosuvastatin (CRESTOR) 10 MG tablet liraglutide (VICTOZA) 18 MG/3ML SOPN  Has the patient contacted their pharmacy? Yes.   (Agent: If no, request that the patient contact the pharmacy for the refill.) (Agent: If yes, when and what did the pharmacy advise?)  Preferred Pharmacy (with phone number or street name): [facility]  Agent: Please be advised that RX refills may take up to 3 business days. We ask that you follow-up with your pharmacy.

## 2018-05-19 NOTE — Telephone Encounter (Signed)
Requested medication (s) are due for refill today: change in pharmacy  Requested medication (s) are on the active medication list: yes  Last refill:  04/21/17   Future visit scheduled: yes 3 weeks  Notes to clinic:  Medication  Not delegated to NT to refill  Pt is due for depression screen   Requested Prescriptions  Pending Prescriptions Disp Refills   ARIPiprazole (ABILIFY) 5 MG tablet 90 tablet     Sig: Take 1 tablet (5 mg total) by mouth daily.     Not Delegated - Psychiatry:  Antipsychotics - Second Generation (Atypical) - aripiprazole Failed - 05/19/2018  3:15 PM      Failed - This refill cannot be delegated      Passed - Valid encounter within last 6 months    Recent Outpatient Visits          5 months ago Preventative health care   [place] [facility], Brayton Mars, MD   9 months ago Type 2 diabetes mellitus without complication, with long-term current use of insulin ([place])   [facility] [place], Brayton Mars, MD   1 year ago Type 2 diabetes mellitus without complication, with long-term current use of insulin ([place])   [facility] [place], Brayton Mars, MD   1 year ago Type 2 diabetes mellitus without complication, with long-term current use of insulin ([place])   [place] [facility], Brayton Mars, MD   1 year ago Preventative health care   [place] [facility] [place], Brayton Mars, MD      Future Appointments            In 2 weeks Marin Olp, MD [facility] [place], [facility]         Signed Prescriptions Disp Refills   amLODipine-benazepril (LOTREL) 5-20 MG capsule 90 capsule 0    Sig: Take 1 capsule by mouth daily.     Cardiovascular: CCB + ACEI Combos Passed - 05/19/2018  3:15 PM      Passed - Cr in normal range and within 180 days    Creat  Date Value Ref Range Status  09/10/2011 1.2 0.6 - 1.2 mg/dl Final   Creatinine, Ser  Date Value Ref Range Status   12/08/2017 1.07 0.40 - 1.50 mg/dL Final         Passed - K in normal range and within 180 days    Potassium  Date Value Ref Range Status  12/08/2017 4.5 3.5 - 5.1 mEq/L Final  09/10/2011 4.8 (H) 3.3 - 4.7 mEq/L Final         Passed - Patient is not pregnant      Passed - Last BP in normal range    BP Readings from Last 1 Encounters:  12/08/17 120/68         Passed - Valid encounter within last 6 months    Recent Outpatient Visits          5 months ago Preventative health care   [place] [facility], Brayton Mars, MD   9 months ago Type 2 diabetes mellitus without complication, with long-term current use of insulin [facility])   [facility] [place], Brayton Mars, MD   1 year ago Type 2 diabetes mellitus without complication, with long-term current use of insulin [facility])   [facility] [place], Brayton Mars, MD   1 year ago Type 2 diabetes mellitus without complication, with long-term current use of insulin ([place])  Therapist, music at [place], MD   1 year ago Preventative health care   [place] [facility] [place], MD      Future Appointments            In 2 weeks Marin Olp, MD [place], [facility]          desvenlafaxine (PRISTIQ) 50 MG 24 hr tablet 90 tablet 0    Sig: Take 1 tablet (50 mg total) by mouth daily.     Psychiatry: Antidepressants - SNRI - desvenlafaxine & venlafaxine Failed - 05/19/2018  3:15 PM      Failed - Completed PHQ-2 or PHQ-9 in the last 360 days.      Passed - LDL in normal range and within 360 days    LDL Cholesterol  Date Value Ref Range Status  12/08/2017 56 0 - 99 mg/dL Final         Passed - Total Cholesterol in normal range and within 360 days    Cholesterol  Date Value Ref Range Status  12/08/2017 134 0 - 200 mg/dL Final    Comment:    ATP III Classification       Desirable:  < 200 mg/dL                Borderline High:  200 - 239 mg/dL          High:  > = 240 mg/dL         Passed - Triglycerides in normal range and within 360 days    Triglycerides  Date Value Ref Range Status  12/08/2017 109.0 0.0 - 149.0 mg/dL Final    Comment:    Normal:  <150 mg/dLBorderline High:  150 - 199 mg/dL         Passed - Last BP in normal range    BP Readings from Last 1 Encounters:  12/08/17 120/68         Passed - Valid encounter within last 6 months    Recent Outpatient Visits          5 months ago Preventative health care   [place] [facility], Brayton Mars, MD   9 months ago Type 2 diabetes mellitus without complication, with long-term current use of insulin ([place])   [facility] [place], Brayton Mars, MD   1 year ago Type 2 diabetes mellitus without complication, with long-term current use of insulin ([place])   [facility] [place], Brayton Mars, MD   1 year ago Type 2 diabetes mellitus without complication, with long-term current use of insulin ([place])   [place] [facility] [place], MD   1 year ago Preventative health care   [place] [facility] [place], Brayton Mars, MD      Future Appointments            In 2 weeks Marin Olp, MD [place], [facility]          rosuvastatin (CRESTOR) 10 MG tablet 90 tablet 0    Sig: Take 1 tablet (10 mg total) by mouth daily.     Cardiovascular:  Antilipid - Statins Passed - 05/19/2018  3:15 PM      Passed - Total Cholesterol in normal range and within 360 days    Cholesterol  Date Value Ref Range Status  12/08/2017 134 0 - 200 mg/dL Final    Comment:    ATP III Classification  Desirable:  < 200 mg/dL               Borderline High:  200 - 239 mg/dL          High:  > = 240 mg/dL         Passed - LDL in normal range and within 360 days    LDL Cholesterol  Date Value Ref Range Status  12/08/2017 56 0 - 99 mg/dL Final          Passed - HDL in normal range and within 360 days    HDL  Date Value Ref Range Status  12/08/2017 55.80 >39.00 mg/dL Final         Passed - Triglycerides in normal range and within 360 days    Triglycerides  Date Value Ref Range Status  12/08/2017 109.0 0.0 - 149.0 mg/dL Final    Comment:    Normal:  <150 mg/dLBorderline High:  150 - 199 mg/dL         Passed - Patient is not pregnant      Passed - Valid encounter within last 12 months    Recent Outpatient Visits          5 months ago Preventative health care   [place] [facility], Brayton Mars, MD   9 months ago Type 2 diabetes mellitus without complication, with long-term current use of insulin ([place])   [facility] [place], Brayton Mars, MD   1 year ago Type 2 diabetes mellitus without complication, with long-term current use of insulin ([place])   [facility] [place], Brayton Mars, MD   1 year ago Type 2 diabetes mellitus without complication, with long-term current use of insulin ([place])   [place] [facility], Brayton Mars, MD   1 year ago Preventative health care   [place] [facility] [place], Brayton Mars, MD      Future Appointments            In 2 weeks Yong Channel, Brayton Mars, MD [place], [facility]          liraglutide (VICTOZA) 18 MG/3ML SOPN 27 mL 1    Sig: Inject subcutaneously 1.8mg  daily     Endocrinology:  Diabetes - GLP-1 Receptor Agonists Passed - 05/19/2018  3:15 PM      Passed - HBA1C is between 0 and 7.9 and within 180 days    Hgb A1c MFr Bld  Date Value Ref Range Status  12/08/2017 5.7 4.6 - 6.5 % Final    Comment:    Glycemic Control Guidelines for People with Diabetes:Non Diabetic:  <6%Goal of Therapy: <7%Additional Action Suggested:  >8%          Passed - Valid encounter within last 6 months    Recent Outpatient Visits          5 months ago Preventative health care   [place] [facility], Brayton Mars, MD   9 months ago Type 2 diabetes mellitus without complication, with long-term current use of insulin [facility])   [facility] [place], Brayton Mars, MD   1 year ago Type 2 diabetes mellitus without complication, with long-term current use of insulin [facility])   [facility] [place], Brayton Mars, MD   1 year ago Type 2 diabetes mellitus without complication, with long-term current use of insulin ([place])   [place] [facility], Brayton Mars, MD   1 year ago Preventative health  care   [employer] [facility] [place], Brayton Mars, MD      Future Appointments            In 2 weeks Yong Channel, Brayton Mars, MD [place], [place]

## 2018-05-22 MED ORDER — ARIPIPRAZOLE 5 MG PO TABS: 5.0000 mg | ORAL_TABLET | Freq: Every day | ORAL | 0 refills | Status: DC

## 2018-05-27 ENCOUNTER — Encounter: Payer: Self-pay | Admitting: Gastroenterology

## 2018-06-08 ENCOUNTER — Ambulatory Visit (INDEPENDENT_AMBULATORY_CARE_PROVIDER_SITE_OTHER): Payer: 59 | Admitting: Family Medicine

## 2018-06-08 ENCOUNTER — Encounter: Payer: Self-pay | Admitting: Family Medicine

## 2018-06-08 ENCOUNTER — Other Ambulatory Visit: Payer: Self-pay

## 2018-06-08 VITALS — BP 136/76 | HR 84 | Temp 98.3°F | Ht 69.0 in | Wt 280.0 lb

## 2018-06-08 DIAGNOSIS — I10 Essential (primary) hypertension: Secondary | ICD-10-CM

## 2018-06-08 DIAGNOSIS — E785 Hyperlipidemia, unspecified: Secondary | ICD-10-CM

## 2018-06-08 DIAGNOSIS — Z794 Long term (current) use of insulin: Secondary | ICD-10-CM | POA: Diagnosis not present

## 2018-06-08 DIAGNOSIS — E119 Type 2 diabetes mellitus without complications: Secondary | ICD-10-CM | POA: Diagnosis not present

## 2018-06-08 DIAGNOSIS — E1169 Type 2 diabetes mellitus with other specified complication: Secondary | ICD-10-CM

## 2018-06-08 DIAGNOSIS — F339 Major depressive disorder, recurrent, unspecified: Secondary | ICD-10-CM | POA: Diagnosis not present

## 2018-06-08 DIAGNOSIS — E1159 Type 2 diabetes mellitus with other circulatory complications: Secondary | ICD-10-CM | POA: Diagnosis not present

## 2018-06-08 LAB — COMPREHENSIVE METABOLIC PANEL
ALBUMIN: 4.3 g/dL (ref 3.5–5.2)
ALK PHOS: 45 U/L (ref 39–117)
ALT: 41 U/L (ref 0–53)
AST: 28 U/L (ref 0–37)
BUN: 13 mg/dL (ref 6–23)
CALCIUM: 9.1 mg/dL (ref 8.4–10.5)
CO2: 28 mEq/L (ref 19–32)
Chloride: 103 mEq/L (ref 96–112)
Creatinine, Ser: 1.07 mg/dL (ref 0.40–1.50)
GFR: 70.85 mL/min (ref >60.00)
Glucose, Bld: 107 mg/dL — ABNORMAL HIGH (ref 70–99)
POTASSIUM: 4.4 meq/L (ref 3.5–5.1)
SODIUM: 139 meq/L (ref 135–145)
TOTAL PROTEIN: 6.9 g/dL (ref 6.0–8.3)
Total Bilirubin: 0.3 mg/dL (ref 0.2–1.2)

## 2018-06-08 LAB — HEMOGLOBIN A1C: HEMOGLOBIN A1C: 5.8 % (ref 4.6–6.5)

## 2018-06-10 ENCOUNTER — Other Ambulatory Visit: Payer: Self-pay | Admitting: Family Medicine

## 2018-06-10 NOTE — Patient Instructions (Signed)
Health Maintenance Due  Topic Date Due  . COLONOSCOPY  05/01/2018    Depression screen [facility] 2/9 06/08/2018 03/30/2017 12/15/2011  Decreased Interest 0 0 1  Down, Depressed, Hopeless 1 0 1  PHQ - 2 Score 1 0 2  Altered sleeping 0 0 -  Tired, decreased energy 0 1 -  Change in appetite 1 0 -  Feeling bad or failure about yourself  2 1 -  Trouble concentrating 0 0 -  Moving slowly or fidgety/restless 0 0 -  Suicidal thoughts 1 0 -  PHQ-9 Score 5 2 -  Difficult doing work/chores - Not difficult at all -

## 2018-06-10 NOTE — Progress Notes (Signed)
Phone [phone]   Subjective:  [name] is a 58 y.o. year old very pleasant male patient who presents for/with See problem oriented charting ROS-  No chest pain or shortness of breath. No headache or blurry vision.     Past Medical History-  Patient Active Problem List   Diagnosis Date Noted  . Morbid obesity ([place]) 12/13/2008    Priority: High  . Type 2 diabetes mellitus ([place]) 03/05/2008    Priority: High  . History of melanoma. R ear.  12/16/2006    Priority: High  . Hypertension associated with diabetes ([place]) 03/04/2015    Priority: Medium  . GERD (gastroesophageal reflux disease) 03/04/2015    Priority: Medium  . Major depression, recurrent, full remission ([place]) 12/16/2006    Priority: Medium  . Hyperlipidemia associated with type 2 diabetes mellitus ([place]) 11/07/2006    Priority: Medium  . Eczema 12/02/2015    Priority: Low    Medications- reviewed and updated Current Outpatient Medications  Medication Sig Dispense Refill  . amLODipine-benazepril (LOTREL) 5-20 MG capsule Take 1 capsule by mouth daily. 90 capsule 0  . ARIPiprazole (ABILIFY) 5 MG tablet Take 1 tablet (5 mg total) by mouth daily. 30 tablet 0  . Blood Glucose Monitoring Suppl (ONETOUCH VERIO FLEX SYSTEM) w/Device KIT 1 each by Does not apply route 3 (three) times daily. 1 kit 0  . desvenlafaxine (PRISTIQ) 50 MG 24 hr tablet Take 1 tablet (50 mg total) by mouth daily. 90 tablet 0  . glucose blood (ONETOUCH VERIO) test strip USE TO CHECK BLOOD SUGAR THREE TIMES A DAY AND PRN 100 each 5  . insulin NPH-regular Human (NOVOLIN 70/30) (70-30) 100 UNIT/ML injection FOR DIRECTIONS ON HOW TO   TAKE THIS MEDICINE, READ   THE ENCLOSED MEDICATION    INFORMATION FORM 60 mL 3  . JANUMET XR 50-1000 MG TB24 TAKE 1 TABLET TWICE A DAY 180 tablet 3  . liraglutide (VICTOZA) 18 MG/3ML SOPN Inject subcutaneously 1.'8mg'$  daily 27 mL 1  . ONETOUCH DELICA LANCETS 41L MISC USE TO CHECK BLOOD SUGAR THREE TIMES A DAY AND PRN 100  each 5  . pantoprazole (PROTONIX) 20 MG tablet Take 1 tablet (20 mg total) by mouth daily. 90 tablet 3  . rosuvastatin (CRESTOR) 10 MG tablet Take 1 tablet (10 mg total) by mouth daily. 90 tablet 0  . TRINTELLIX 10 MG TABS tablet Take 1 tablet by mouth daily.     No current facility-administered medications for this visit.      Objective:  BP 136/76   Pulse 84   Temp 98.3 F (36.8 C) (Oral)   Ht '5\' 9"'$  (1.753 m)   Wt 280 lb (127 kg)   SpO2 97%   BMI 41.35 kg/m  Gen: NAD, resting comfortably  CV: RRR  Lungs: nonlabored, normal respiratory rate Abdomen: soft/nondistended     Assessment and Plan   #Diabetes/morbid obesity S:Patient using 70/30 and janumet and liraglutide. A1c 5.9 Sugars up slightly. No lows.  Wt up 11 lbs.   A/P: Fortunately even with weight gain diabetes has remained well controlled-continue current medications.  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 Continue current medications.    #  GERD S: Compliant with Protonix 20 mg.  Doing well with current dose.  B12 has been low normal so I recommended at least 250 mcg B12 daily A/P: Doing well-continue current medication-consider Pepcid option at follow-up   #Hypertension % #Depression-follows with psychiatry Dr. Toy Care S: Remains in full remission on Abilify '5mg'$ , pristiq '50mg'$ , trintellix.  No suicidal ideation A/P: Stable. Continue current medications.  PHQ 9 not significantly elevated today  Other notes: 1.  Due for colonoscopy- patient was encouraged to schedule this but perhaps wait a few months given current covid-19 environment  6 Month physical verbally  discussed  Lab/Order associations: Type 2 diabetes mellitus without complication, with long-term current use of insulin ([place]) - Plan: Comprehensive metabolic panel, Hemoglobin A1c  Hyperlipidemia associated with type 2 diabetes mellitus ([place])  Hypertension associated with diabetes ([place])  Return precautions advised.  Garret Reddish, MD

## 2018-08-07 ENCOUNTER — Other Ambulatory Visit: Payer: Self-pay | Admitting: Family Medicine

## 2018-09-21 ENCOUNTER — Other Ambulatory Visit: Payer: Self-pay | Admitting: Family Medicine

## 2018-09-26 ENCOUNTER — Other Ambulatory Visit: Payer: Self-pay

## 2018-09-26 MED ORDER — JANUMET XR 50-1000 MG PO TB24: 1.0000 | ORAL_TABLET | Freq: Two times a day (BID) | ORAL | 1 refills | Status: DC

## 2018-10-31 ENCOUNTER — Encounter: Payer: Self-pay | Admitting: Family Medicine

## 2018-10-31 ENCOUNTER — Other Ambulatory Visit: Payer: Self-pay

## 2018-10-31 ENCOUNTER — Ambulatory Visit: Payer: Self-pay

## 2018-10-31 ENCOUNTER — Ambulatory Visit (INDEPENDENT_AMBULATORY_CARE_PROVIDER_SITE_OTHER): Payer: 59 | Admitting: Family Medicine

## 2018-10-31 DIAGNOSIS — K529 Noninfective gastroenteritis and colitis, unspecified: Secondary | ICD-10-CM | POA: Diagnosis not present

## 2018-10-31 MED ORDER — ONDANSETRON HCL 4 MG PO TABS: 4.0000 mg | ORAL_TABLET | Freq: Three times a day (TID) | ORAL | 0 refills | Status: DC | PRN

## 2018-10-31 NOTE — Patient Instructions (Signed)
-  plenty of fluids with electrolytes (soup broth, gatorade, etc) if not eating; water is best if you are able to eat  -zofran if needed for the nausea/vomiting  -imodium if needed for diarrhea, available over the counter  -no dairy for 1-2 weeks  I hope you are feeling better soon! Seek care promptly if your symptoms worsen, new concerns arise or you are not improving with treatment.

## 2018-10-31 NOTE — Progress Notes (Signed)
Virtual Visit via Video Note  I connected with [name]  on 10/31/18 at 12:00 PM EDT by a video enabled telemedicine application and verified that I am speaking with the correct person using two identifiers.  Location patient: home Location provider:work or home office Persons participating in the virtual visit: patient, provider  I discussed the limitations of evaluation and management by telemedicine and the availability of in person appointments. The patient expressed understanding and agreed to proceed.   HPI:  Acute visit for Nausea and Vomiting: -started 2 days ago -has been nauseous today, no emesis today, watery diarrhea as well the first 24 hours and now better -for the first 24 hours threw up a number of times -non-bloody, non-bilious, no coffee grounds -denies abd pain, alcohol use, fevers, chills, cough, congestion, sore throat, body aches, loss of taste or smell, rashes -doing somewhat better today, but still with nausea -no known sick contacts recently -he has been been alone and has not working or visiting outside of his home  ROS: See pertinent positives and negatives per HPI.  Past Medical History:  Diagnosis Date  . Cancer ([place])    melanoma;  right ear  . Diabetes mellitus   . GERD (gastroesophageal reflux disease)   . Hyperlipidemia   . Hypertension   . Metabolic syndrome   . Obesity     Past Surgical History:  Procedure Laterality Date  . CYST REMOVAL TRUNK  1989   benign  . MELANOMA EXCISION  2008   right ear and neck  . reconstructive plastic surgery face  1967   dog bite at age 62; about 48 surgeries for 10 years  . TONSILLECTOMY  1966   age 31    Family History  Problem Relation Age of Onset  . Arthritis Mother   . Stroke Father 72       heavy smoker 4-5 packs a day unfiltered  . Colon cancer Neg Hx   . Esophageal cancer Neg Hx   . Rectal cancer Neg Hx   . Stomach cancer Neg Hx   . Schizophrenia Father        homeless and did not get medical  care in time  . Alcoholism Father     SOCIAL HX: see hpi   Current Outpatient Medications:  .  amLODipine-benazepril (LOTREL) 5-20 MG capsule, TAKE 1 CAPSULE DAILY, Disp: 90 capsule, Rfl: 0 .  ARIPiprazole (ABILIFY) 5 MG tablet, TAKE 1 TABLET DAILY, Disp: 30 tablet, Rfl: 11 .  Blood Glucose Monitoring Suppl (ONETOUCH VERIO FLEX SYSTEM) w/Device KIT, 1 each by Does not apply route 3 (three) times daily., Disp: 1 kit, Rfl: 0 .  desvenlafaxine (PRISTIQ) 50 MG 24 hr tablet, TAKE 1 TABLET DAILY, Disp: 90 tablet, Rfl: 3 .  glucose blood (ONETOUCH VERIO) test strip, USE TO CHECK BLOOD SUGAR THREE TIMES A DAY AND PRN, Disp: 100 each, Rfl: 5 .  insulin NPH-regular Human (NOVOLIN 70/30) (70-30) 100 UNIT/ML injection, FOR DIRECTIONS ON HOW TO   TAKE THIS MEDICINE, READ   THE ENCLOSED MEDICATION    INFORMATION FORM, Disp: 60 mL, Rfl: 3 .  liraglutide (VICTOZA) 18 MG/3ML SOPN, Inject subcutaneously 1.25m daily, Disp: 27 mL, Rfl: 1 .  ONETOUCH DELICA LANCETS 349ZMISC, USE TO CHECK BLOOD SUGAR THREE TIMES A DAY AND PRN, Disp: 100 each, Rfl: 5 .  pantoprazole (PROTONIX) 20 MG tablet, TAKE 1 TABLET DAILY, Disp: 90 tablet, Rfl: 3 .  rosuvastatin (CRESTOR) 10 MG tablet, TAKE 1 TABLET DAILY, Disp: 90  tablet, Rfl: 3 .  SitaGLIPtin-MetFORMIN HCl (JANUMET XR) 50-1000 MG TB24, Take 1 tablet by mouth 2 (two) times daily., Disp: 180 tablet, Rfl: 1 .  TRINTELLIX 10 MG TABS tablet, Take 1 tablet by mouth daily., Disp: , Rfl:  .  ondansetron (ZOFRAN) 4 MG tablet, Take 1 tablet (4 mg total) by mouth every 8 (eight) hours as needed for nausea or vomiting., Disp: 10 tablet, Rfl: 0  EXAM:  VITALS per patient if applicable: denies fever  GENERAL: alert, oriented, appears well and in no acute distress  HEENT: atraumatic, conjunttiva clear, no obvious abnormalities on inspection of external nose and ears  NECK: normal movements of the head and neck  LUNGS: on inspection no signs of respiratory distress, breathing rate  appears normal, no obvious gross SOB, gasping or wheezing  CV: no obvious cyanosis  ABD: denies TTP  MS: moves all visible extremities without noticeable abnormality  PSYCH/NEURO: pleasant and cooperative, no obvious depression or anxiety, speech and thought processing grossly intact  ASSESSMENT AND PLAN:  Discussed the following assessment and plan:  Gastroenteritis -  -we discussed possible serious and likely etiologies, workup and treatment, treatment risks and return precautions. Suspect gastroenteritis or food poisoning. Seems to be improving today with resolution of vomiting and diarrhea. He is currently tolerating PO intake of fluids per his report. Did discuss the onion recall and he will check to see if he has consumed them. -after this discussion, Amon opted for zofran rx, imodium if needed, oral rehydration, prompt re-eval if worsening or recurrent symptoms or concerns -of course, we advised Neils  to return or notify a doctor immediately if symptoms worsen or persist or new concerns arise.  HM: did note he is due for his colon ca screening  and advised him of this, he agrees to schedule.  I discussed the assessment and treatment plan with the patient. The patient was provided an opportunity to ask questions and all were answered. The patient agreed with the plan and demonstrated an understanding of the instructions.      Lucretia Kern, DO

## 2018-10-31 NOTE — Telephone Encounter (Signed)
Vomiting started Sunday afternoon. Pt has having dry heaves today. Pt just drank water. No signs of of dehydration. Last voided 30 minutes ago. Pt diarrhea but attributed to medication. Pt stated today he had formed BM. No abdominal pain or fever or other symptoms.  Care advice given and pt verbalized understanding.  Call transferred to [facility] in office.     Reason for Disposition . [1] MILD or MODERATE vomiting AND [2] present > 48 hours (2 days) (Exception: mild vomiting with associated diarrhea)  Answer Assessment - Initial Assessment Questions 1. VOMITING SEVERITY: "How many times have you vomited in the past 24 hours?"     - MILD:  1 - 2 times/day    - MODERATE: 3 - 5 times/day, decreased oral intake without significant weight loss or symptoms of dehydration    - SEVERE: 6 or more times/day, vomits everything or nearly everything, with significant weight loss, symptoms of dehydration      mild 2. ONSET: "When did the vomiting begin?"     Sunday 3. FLUIDS: "What fluids or food have you vomited up today?" "Have you been able to keep any fluids down?"     Water 4 oz- dryheavin but tlerated water 4. ABDOMINAL PAIN: "Are your having any abdominal pain?" If yes : "How bad is it and what does it feel like?" (e.g., crampy, dull, intermittent, constant)      no 5. DIARRHEA: "Is there any diarrhea?" If so, ask: "How many times today?"      no 6. CONTACTS: "Is there anyone else in the family with the same symptoms?"      no 7. CAUSE: "What do you think is causing your vomiting?"     Pt does not know h/o "bad stomach"  8. HYDRATION STATUS: "Any signs of dehydration?" (e.g., dry mouth [not only dry lips], too weak to stand) "When did you last urinate?"     No signs of dehydration 9. OTHER SYMPTOMS: "Do you have any other symptoms?" (e.g., fever, headache, vertigo, vomiting blood or coffee grounds, recent head injury)     no 10. PREGNANCY: "Is there any chance you are pregnant?" "When was  your last menstrual period?"       n/a  Protocols used: [facility]

## 2018-11-02 ENCOUNTER — Other Ambulatory Visit: Payer: Self-pay | Admitting: Family Medicine

## 2018-11-03 NOTE — Telephone Encounter (Signed)
Rx refilled pt has been scheduled for CPE.

## 2018-11-30 ENCOUNTER — Other Ambulatory Visit: Payer: Self-pay | Admitting: Family Medicine

## 2018-12-11 ENCOUNTER — Telehealth: Payer: 59 | Admitting: Physician Assistant

## 2018-12-11 DIAGNOSIS — R112 Nausea with vomiting, unspecified: Secondary | ICD-10-CM

## 2018-12-11 MED ORDER — ONDANSETRON HCL 4 MG PO TABS: 4.0000 mg | ORAL_TABLET | Freq: Three times a day (TID) | ORAL | 0 refills | Status: DC | PRN

## 2018-12-11 NOTE — Progress Notes (Signed)
We are sorry that you are not feeling well. Here is how we plan to help!  Based on what you have shared with me it looks like you have a reaction to a recent medication dose increase that is irritating your GI tract.  Please contact the prescribing provider to discuss this possible side effect.  A simple dose adjustment may help alleviate your symptoms.   Vomiting is the forceful emptying of a portion of the stomach's content through the mouth.  Although nausea and vomiting can make you feel miserable, it's important to remember that these are not diseases, but rather symptoms of an underlying illness.  When we treat short term symptoms, we always caution that any symptoms that persist should be fully evaluated in a medical office.  I have prescribed a medication that will help alleviate your symptoms and allow you to stay hydrated:  Zofran 4 mg 1 tablet every 8 hours as needed for nausea and vomiting  HOME CARE:  Drink clear liquids.  This is very important! Dehydration (the lack of fluid) can lead to a serious complication.  Start off with 1 tablespoon every 5 minutes for 8 hours.  You may begin eating bland foods after 8 hours without vomiting.  Start with saltine crackers, white bread, rice, mashed potatoes, applesauce.  After 48 hours on a bland diet, you may resume a normal diet.  Try to go to sleep.  Sleep often empties the stomach and relieves the need to vomit.  GET HELP RIGHT AWAY IF:   Your symptoms do not improve or worsen within 2 days after treatment.  You have a fever for over 3 days.  You cannot keep down fluids after trying the medication.  MAKE SURE YOU:   Understand these instructions.  Will watch your condition.  Will get help right away if you are not doing well or get worse.   Thank you for choosing an e-visit. Your e-visit answers were reviewed by a board certified advanced clinical practitioner to complete your personal care plan. Depending upon the  condition, your plan could have included both over the counter or prescription medications. Please review your pharmacy choice. Be sure that the pharmacy you have chosen is open so that you can pick up your prescription now.  If there is a problem you may message your provider in [place] to have the prescription routed to another pharmacy. Your safety is important to [place]. If you have drug allergies check your prescription carefully.  For the next 24 hours, you can use MyChart to ask questions about today's visit, request a non-urgent call back, or ask for a work or school excuse from your e-visit provider. You will get an e-mail in the next two days asking about your experience. I hope that your e-visit has been valuable and will speed your recovery.   Greater than 5 minutes, yet less than 10 minutes of time have been spent researching, coordinating and implementing care for this patient today.

## 2018-12-25 NOTE — Progress Notes (Signed)
Phone: [phone]   Subjective:  Patient presents today for their annual physical. Chief complaint-noted.   See problem oriented charting- ROS- full  review of systems was completed and negative  except for: diarrhea, nausea, vomitting- in AM  The following were reviewed and entered/updated in epic: Past Medical History:  Diagnosis Date  . Cancer ([place])    melanoma;  right ear  . Diabetes mellitus   . GERD (gastroesophageal reflux disease)   . Hyperlipidemia   . Hypertension   . Metabolic syndrome   . Obesity    Patient Active Problem List   Diagnosis Date Noted  . Morbid obesity ([place]) 12/13/2008    Priority: High  . Type 2 diabetes mellitus ([place]) 03/05/2008    Priority: High  . History of melanoma. R ear.  12/16/2006    Priority: High  . Hypertension associated with diabetes ([place]) 03/04/2015    Priority: Medium  . GERD (gastroesophageal reflux disease) 03/04/2015    Priority: Medium  . Major depression, recurrent, full remission ([place]) 12/16/2006    Priority: Medium  . Hyperlipidemia associated with type 2 diabetes mellitus ([place]) 11/07/2006    Priority: Medium  . Eczema 12/02/2015    Priority: Low   Past Surgical History:  Procedure Laterality Date  . CYST REMOVAL TRUNK  1989   benign  . MELANOMA EXCISION  2008   right ear and neck  . reconstructive plastic surgery face  1967   dog bite at age 59; about 54 surgeries for 10 years  . TONSILLECTOMY  1966   age 18    Family History  Problem Relation Age of Onset  . Arthritis Mother   . Stroke Father 7       heavy smoker 4-5 packs a day unfiltered  . Colon cancer Neg Hx   . Esophageal cancer Neg Hx   . Rectal cancer Neg Hx   . Stomach cancer Neg Hx   . Schizophrenia Father        homeless and did not get medical care in time  . Alcoholism Father     Medications- reviewed and updated Current Outpatient Medications  Medication Sig Dispense Refill  . amLODipine-benazepril (LOTREL) 5-20 MG capsule TAKE 1  CAPSULE DAILY 90 capsule 3  . ARIPiprazole (ABILIFY) 5 MG tablet TAKE 1 TABLET DAILY 30 tablet 11  . Blood Glucose Monitoring Suppl (ONETOUCH VERIO FLEX SYSTEM) w/Device KIT 1 each by Does not apply route 3 (three) times daily. 1 kit 0  . glucose blood (ONETOUCH VERIO) test strip USE TO CHECK BLOOD SUGAR THREE TIMES A DAY AND PRN 100 each 5  . insulin NPH-regular Human (NOVOLIN 70/30) (70-30) 100 UNIT/ML injection INJECT 30 UNITS SUBCUTANEOUSLY BEFORE BREAKFAST AND DINNER  (IF YOU SKIP A MEAL DO NOT USE THE INSULIN) 60 mL 2  . ondansetron (ZOFRAN) 8 MG tablet Take 1 tablet (8 mg total) by mouth every 8 (eight) hours as needed for nausea or vomiting. 30 tablet 1  . ONETOUCH DELICA LANCETS 76E MISC USE TO CHECK BLOOD SUGAR THREE TIMES A DAY AND PRN 100 each 5  . pantoprazole (PROTONIX) 20 MG tablet TAKE 1 TABLET DAILY 90 tablet 3  . rosuvastatin (CRESTOR) 10 MG tablet TAKE 1 TABLET DAILY 90 tablet 3  . SitaGLIPtin-MetFORMIN HCl (JANUMET XR) 50-1000 MG TB24 Take 1 tablet by mouth 2 (two) times daily. 180 tablet 1  . vortioxetine HBr (TRINTELLIX) 20 MG TABS tablet Take 20 mg by mouth daily.     No current facility-administered  medications for this visit.     Allergies-reviewed and updated Allergies  Allergen Reactions  . Codeine Nausea And Vomiting  . Food Hives    Coconut flakes    Social History   Social History Narrative   Single. 1 dog, 2 cats. Lives alone.       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 Enjoys dinner parties.    Objective  Objective:  BP 138/88   Pulse 76   Temp 98.1 F (36.7 C)   Ht 5' 9" (1.753 m)   Wt 280 lb 6.4 oz (127.2 kg)   SpO2 95%   BMI 41.41 kg/m  Gen: NAD, resting comfortably HEENT: Mucous membranes are moist. Oropharynx normal Neck: no thyromegaly CV: RRR no murmurs rubs or gallops Lungs: CTAB no crackles,  wheeze, rhonchi Abdomen: soft/nontender/nondistended/normal bowel sounds. No rebound or guarding.  Ext: trace edema Skin: warm, dry Neuro: grossly normal, moves all extremities, PERRLA  Diabetic Foot Exam - Simple   Simple Foot Form Diabetic Foot exam was performed with the following findings: Yes 01/01/2019 11:17 AM  Visual Inspection No deformities, no ulcerations, no other skin breakdown bilaterally: Yes Sensation Testing Intact to touch and monofilament testing bilaterally: Yes Pulse Check Posterior Tibialis and Dorsalis pulse intact bilaterally: Yes Comments   prior ingrown toenail removal - lateral 3rd with lateral 3rd of nail regrowing   Assessment and Plan  58 y.o. male presenting for annual physical.  Health Maintenance counseling: 1. Anticipatory guidance: Patient counseled regarding regular dental exams -q6 months, eye exams -yearly,  avoiding smoking and second hand smoke , limiting alcohol to 2 beverages per day .   2. Risk factor reduction:  Advised patient of need for regular exercise and diet rich and fruits and vegetables to reduce risk of heart attack and stroke. Exercise-getting up hourly from desk at home.  taking dog for longer walks- helping the dog as well! Diet- stress eating recently with covid 19 and loss of job.   Weight was down to 269 last year-has increased again to 80-encouraged reversing trend- feels like could cut down on snacking.  Wt Readings from Last 3 Encounters:  01/01/19 280 lb 6.4 oz (127.2 kg)  06/08/18 280 lb (127 kg)  12/08/17 269 lb 3.2 oz (122.1 kg)  3. Immunizations/screenings/ancillary studies-flu shot today given.  Otherwise up-to-date Immunization History  Administered Date(s) Administered  . Influenza Split 02/04/2011, 12/15/2011  . Influenza Whole 03/14/2009  . Influenza,inj,Quad PF,6+ Mos 01/26/2013, 12/28/2013, 03/04/2015, 12/02/2015, 03/30/2017, 12/08/2017, 01/01/2019  . Pneumococcal Polysaccharide-23 03/04/2015  . Td 03/29/2006   . Tdap 04/02/2016  . Zoster Recombinat (Shingrix) 08/03/2016, 10/08/2016  4. Prostate cancer screening- low risk for PSA trend-we will update again with labs.  Low risk rectal exam last year Lab Results  Component Value Date   PSA 0.81 07/27/2016   PSA 0.46 01/27/2010   5. Colon cancer screening -  2015 with 5-year repeat recommended 6. Skin cancer screening-follows with dermatology due to melanoma history- did not see last year and requests referral. advised regular sunscreen use. Denies worrisome, changing, or new skin lesions.  7.  Former smoker-quit in 1990s.  Will need AAA scan at 25. GET UA.   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  Feels stable at present.   Depression- controlled on Abilify  77m, Pristiq 59m Trintellix 2038mfollowed by psychiatry - Dr. KauToy Careyperlipidemia- controlled on Crestor 63m58mpdate lipids today  Diabetes-controlled on  Victoza 18mg78m sopn, Janumet 50-1000mg,33m/R insulin used twice a day typically 31units.  Pt states he has been exercising more and diet is good. Pt is experiencing nausea, diarrhea and vomiting in the mornings even before victoza and he thinks its medication related  Looking back has had diarrhea for years but noted more nausea/vomiting when went up on trintellix. Without zofran may have dry heaves rest of the day. Seemed to worsen after going up on trintellix. Denies abdominal pain Lab Results  Component Value Date   HGBA1C-point-of-care 5.6 01/01/2019   HGBA1C 5.8 06/08/2018   HGBA1C 5.7 12/08/2017   very good control today of diabetes. We are going to hold his victoza as longas home blood sugars do not trend up significantly. If this does not control sugars- could try going back on victoza and changing ot metformin XR only without januviTongaN- controlled on Lotrel 5-20mg, 33mtates he is not checking his BP at home but will get a blood pressure cuff. Pt  denies HA, blurred vision and chest discomfort.   BP Readings from Last 3 Encounters:  01/01/19 138/88  06/10/18 136/76  12/08/17 120/68  blood pressure appears to be trending up with weight gain- encouraged low salt diet, continued walking of dog/regular exercise/weight loss. Monitor with omron series 3 or other that you prefer at least twice a week for next few weeks and if <140/90 can space to once a week.   GERD- controlled on Protonix 20mg, p34mates reflux is under control  recommended follow up:  79-month 84-monthp suggested   Lab/Order associations: fasting   ICD-10-CM   1. Preventative health care  Z00.00 CBC    Comprehensive metabolic panel    Lipid panel    PSA    Ambulatory referral to Gastroenterology    POCT Urinalysis Dipstick (Automated)  2. Type 2 diabetes mellitus without complication, with long-term current use of insulin (HCC)  E11.9 POCT HgB A1C   Z79.4 CBC    Comprehensive metabolic panel    Lipid panel    POCT Urinalysis Dipstick (Automated)  3. History of melanoma. R ear.   Z85.820   4. Major depression, recurrent, full remission (HCC)  F33[place]   5. Hypertension associated with diabetes (HCC)  E11[place]    I10   6. Hyperlipidemia associated with type 2 diabetes mellitus (HCC)  E11.69    E78.5   7. Morbid obesity (HCC)  E66[place]   8. Need for immunization against influenza  Z23 Flu Vaccine QUAD 36+ mos IM  9. Screen for colon cancer  Z12.11 Ambulatory referral to Gastroenterology  10. Screening for prostate cancer  Z12.5 PSA  11. Screening exam for skin cancer  Z12.83 Ambulatory referral to Dermatology  12. Non-intractable vomiting with nausea, unspecified vomiting type  R11.2 Ambulatory referral to Gastroenterology   Return precautions advised.   HGarret Reddish

## 2018-12-25 NOTE — Patient Instructions (Addendum)
Health Maintenance Due  Topic Date Due  . COLONOSCOPY -need to schedule. We will call you within two weeks about your referral to GI and dermatology. If you do not hear within 3 weeks, give [place] a call.   05/01/2018  . INFLUENZA VACCINE -today 10/28/2018  . HEMOGLOBIN A1C -today  Lab Results  Component Value Date   HGBA1C 5.6 01/01/2019    12/09/2018   very good control today of diabetes. We are going to hold his victoza as longas home blood sugars do not trend up significantly. If this does not control sugars- could try going back on victoza and changing out metformin XR only without januvia in the long run but in short run may hold all 3.   Give me an update in about 3 weeks- I may pull off your janumet as well for at least a week to see if this resolves- if not may need to work up further such as stool studies or asking GI for their opinion. Could also be the trintellix.   blood pressure appears to be trending up with weight gain- encouraged low salt diet, continued walking of dog/regular exercise/weight loss. Monitor with omron series 3 home blood pressure cuff or other that you prefer at least twice a week for next few weeks and if <140/90 can space to once a week.

## 2019-01-01 ENCOUNTER — Other Ambulatory Visit: Payer: Self-pay

## 2019-01-01 ENCOUNTER — Encounter: Payer: Self-pay | Admitting: Family Medicine

## 2019-01-01 ENCOUNTER — Ambulatory Visit (INDEPENDENT_AMBULATORY_CARE_PROVIDER_SITE_OTHER): Payer: 59 | Admitting: Family Medicine

## 2019-01-01 VITALS — BP 138/88 | HR 76 | Temp 98.1°F | Ht 69.0 in | Wt 280.4 lb

## 2019-01-01 DIAGNOSIS — Z1283 Encounter for screening for malignant neoplasm of skin: Secondary | ICD-10-CM

## 2019-01-01 DIAGNOSIS — Z85820 Personal history of malignant melanoma of skin: Secondary | ICD-10-CM | POA: Diagnosis not present

## 2019-01-01 DIAGNOSIS — Z1211 Encounter for screening for malignant neoplasm of colon: Secondary | ICD-10-CM

## 2019-01-01 DIAGNOSIS — E1169 Type 2 diabetes mellitus with other specified complication: Secondary | ICD-10-CM

## 2019-01-01 DIAGNOSIS — Z125 Encounter for screening for malignant neoplasm of prostate: Secondary | ICD-10-CM | POA: Diagnosis not present

## 2019-01-01 DIAGNOSIS — F3342 Major depressive disorder, recurrent, in full remission: Secondary | ICD-10-CM

## 2019-01-01 DIAGNOSIS — Z794 Long term (current) use of insulin: Secondary | ICD-10-CM | POA: Diagnosis not present

## 2019-01-01 DIAGNOSIS — E119 Type 2 diabetes mellitus without complications: Secondary | ICD-10-CM | POA: Diagnosis not present

## 2019-01-01 DIAGNOSIS — Z0000 Encounter for general adult medical examination without abnormal findings: Secondary | ICD-10-CM

## 2019-01-01 DIAGNOSIS — E785 Hyperlipidemia, unspecified: Secondary | ICD-10-CM

## 2019-01-01 DIAGNOSIS — I10 Essential (primary) hypertension: Secondary | ICD-10-CM

## 2019-01-01 DIAGNOSIS — E1159 Type 2 diabetes mellitus with other circulatory complications: Secondary | ICD-10-CM

## 2019-01-01 DIAGNOSIS — Z23 Encounter for immunization: Secondary | ICD-10-CM | POA: Diagnosis not present

## 2019-01-01 DIAGNOSIS — R112 Nausea with vomiting, unspecified: Secondary | ICD-10-CM

## 2019-01-01 LAB — POC URINALSYSI DIPSTICK (AUTOMATED)
Bilirubin, UA: NEGATIVE
Blood, UA: NEGATIVE
Glucose, UA: NEGATIVE
Ketones, UA: NEGATIVE
Leukocytes, UA: NEGATIVE
Nitrite, UA: NEGATIVE
Protein, UA: NEGATIVE
Spec Grav, UA: 1.01 (ref 1.010–1.025)
Urobilinogen, UA: 0.2 E.U./dL
pH, UA: 8 (ref 5.0–8.0)

## 2019-01-01 LAB — COMPREHENSIVE METABOLIC PANEL
ALT: 28 U/L (ref 0–53)
AST: 21 U/L (ref 0–37)
Albumin: 4.3 g/dL (ref 3.5–5.2)
Alkaline Phosphatase: 42 U/L (ref 39–117)
BUN: 10 mg/dL (ref 6–23)
CO2: 31 mEq/L (ref 19–32)
Calcium: 9.6 mg/dL (ref 8.4–10.5)
Chloride: 101 mEq/L (ref 96–112)
Creatinine, Ser: 1.11 mg/dL (ref 0.40–1.50)
GFR: 67.78 mL/min (ref >60.00)
Glucose, Bld: 107 mg/dL — ABNORMAL HIGH (ref 70–99)
Potassium: 4.6 mEq/L (ref 3.5–5.1)
Sodium: 140 mEq/L (ref 135–145)
Total Bilirubin: 0.4 mg/dL (ref 0.2–1.2)
Total Protein: 6.7 g/dL (ref 6.0–8.3)

## 2019-01-01 LAB — PSA: PSA: 0.97 ng/mL (ref 0.10–4.00)

## 2019-01-01 LAB — LIPID PANEL
Cholesterol: 164 mg/dL (ref 0–200)
HDL: 69.4 mg/dL (ref >39.00)
LDL Cholesterol: 64 mg/dL (ref 0–99)
NonHDL: 94.7
Total CHOL/HDL Ratio: 2
Triglycerides: 152 mg/dL — ABNORMAL HIGH (ref 0.0–149.0)
VLDL: 30.4 mg/dL (ref 0.0–40.0)

## 2019-01-01 LAB — CBC
HCT: 42.1 % (ref 39.0–52.0)
Hemoglobin: 14 g/dL (ref 13.0–17.0)
MCHC: 33.2 g/dL (ref 30.0–36.0)
MCV: 90.2 fl (ref 78.0–100.0)
Platelets: 253 10*3/uL (ref 150.0–400.0)
RBC: 4.67 Mil/uL (ref 4.22–5.81)
RDW: 13.6 % (ref 11.5–15.5)
WBC: 6.5 10*3/uL (ref 4.0–10.5)

## 2019-01-01 LAB — POCT GLYCOSYLATED HEMOGLOBIN (HGB A1C): Hemoglobin A1C: 5.6 % (ref 4.0–5.6)

## 2019-01-01 MED ORDER — ONDANSETRON HCL 8 MG PO TABS: 8.0000 mg | ORAL_TABLET | Freq: Three times a day (TID) | ORAL | 1 refills | Status: DC | PRN

## 2019-01-01 NOTE — Assessment & Plan Note (Signed)
Diabetes-controlled on  Victoza 18mg /64ml sopn, Janumet 50-1000mg , NPH/R insulin used twice a day typically 31 units.  Pt states he has been exercising more and diet is good. Pt is experiencing nausea, diarrhea and vomiting in the mornings even before victoza and he thinks its medication related Lab Results  Component Value Date   HGBA1C-point-of-care 5.6 01/01/2019   HGBA1C 5.8 06/08/2018   HGBA1C 5.7 12/08/2017   very good control today of diabetes. We are going to hold his victoza as longas home blood sugars do not trend up significantly. If this does not control sugars- could try going back on victoza and changing ot metformin XR only without [place].

## 2019-01-01 NOTE — Addendum Note (Signed)
Addended by: Francis Dowse T on: 01/01/2019 12:14 PM   Modules accepted: Orders

## 2019-01-09 ENCOUNTER — Encounter: Payer: Self-pay | Admitting: Gastroenterology

## 2019-01-24 ENCOUNTER — Encounter: Payer: Self-pay | Admitting: Gastroenterology

## 2019-01-24 ENCOUNTER — Other Ambulatory Visit: Payer: Self-pay

## 2019-01-24 ENCOUNTER — Ambulatory Visit (AMBULATORY_SURGERY_CENTER): Payer: 59 | Admitting: *Deleted

## 2019-01-24 VITALS — Temp 96.9°F | Ht 69.0 in | Wt 285.0 lb

## 2019-01-24 DIAGNOSIS — Z1159 Encounter for screening for other viral diseases: Secondary | ICD-10-CM

## 2019-01-24 DIAGNOSIS — Z86010 Personal history of colonic polyps: Secondary | ICD-10-CM

## 2019-01-24 MED ORDER — NA SULFATE-K SULFATE-MG SULF 17.5-3.13-1.6 GM/177ML PO SOLN: ORAL | 0 refills | Status: DC

## 2019-01-24 NOTE — Progress Notes (Signed)
Patient is here in-person for PV. Patient denies any allergies to eggs or soy. Patient denies any problems with anesthesia/sedation. Patient denies any oxygen use at home. Patient denies taking any diet/weight loss medications or blood thinners. Patient is not being treated for MRSA or C-diff. EMMI education assisgned to the patient for the procedure, this was explained and instructions given to patient. Suprep 15 off coupon given to pt. COVID-19 screening test is on 02/02/2019 850am, the pt is aware.   Pt is aware that care partner will wait in the car during procedure; if they feel like they will be too hot or cold to wait in the car; they may wait in the 4 th floor lobby. Patient is aware to bring only one care partner. We want them to wear a mask (we do not have any that we can provide them), practice social distancing, and we will check their temperatures when they get here.  I did remind the patient that their care partner needs to stay in the parking lot the entire time and have a cell phone available, we will call them when the pt is ready for discharge. Patient will wear mask into building.

## 2019-02-02 ENCOUNTER — Other Ambulatory Visit: Payer: Self-pay | Admitting: Gastroenterology

## 2019-02-02 LAB — SARS CORONAVIRUS 2 (TAT 6-24 HRS): SARS Coronavirus 2: NEGATIVE

## 2019-02-07 ENCOUNTER — Other Ambulatory Visit: Payer: Self-pay | Admitting: Gastroenterology

## 2019-02-07 ENCOUNTER — Ambulatory Visit (AMBULATORY_SURGERY_CENTER): Payer: 59 | Admitting: Gastroenterology

## 2019-02-07 ENCOUNTER — Other Ambulatory Visit: Payer: Self-pay

## 2019-02-07 ENCOUNTER — Encounter: Payer: Self-pay | Admitting: Gastroenterology

## 2019-02-07 VITALS — BP 152/77 | HR 80 | Temp 98.0°F | Resp 15 | Ht 69.0 in | Wt 285.0 lb

## 2019-02-07 DIAGNOSIS — D120 Benign neoplasm of cecum: Secondary | ICD-10-CM

## 2019-02-07 DIAGNOSIS — D123 Benign neoplasm of transverse colon: Secondary | ICD-10-CM | POA: Diagnosis not present

## 2019-02-07 DIAGNOSIS — K635 Polyp of colon: Secondary | ICD-10-CM

## 2019-02-07 DIAGNOSIS — Z86010 Personal history of colonic polyps: Secondary | ICD-10-CM | POA: Diagnosis not present

## 2019-02-07 DIAGNOSIS — D124 Benign neoplasm of descending colon: Secondary | ICD-10-CM

## 2019-02-07 MED ORDER — SODIUM CHLORIDE 0.9 % IV SOLN: 500.0000 mL | Freq: Once | INTRAVENOUS | Status: DC

## 2019-02-07 NOTE — Patient Instructions (Signed)
Read all of the handouts given to you by your recovery room nurse.   No NSAIDS: aspirin, ibuprofen, aleve for 2 weeks per Dr. Fuller Plan.  Thank-you for choosing [place] for your healthcare needs today.  YOU HAD AN ENDOSCOPIC PROCEDURE TODAY AT [facility]:   Refer to the procedure report that was given to you for any specific questions about what was found during the examination.  If the procedure report does not answer your questions, please call your gastroenterologist to clarify.  If you requested that your care partner not be given the details of your procedure findings, then the procedure report has been included in a sealed envelope for you to review at your convenience later.  YOU SHOULD EXPECT: Some feelings of bloating in the abdomen. Passage of more gas than usual.  Walking can help get rid of the air that was put into your GI tract during the procedure and reduce the bloating. If you had a lower endoscopy (such as a colonoscopy or flexible sigmoidoscopy) you may notice spotting of blood in your stool or on the toilet paper. If you underwent a bowel prep for your procedure, you may not have a normal bowel movement for a few days.  Please Note:  You might notice some irritation and congestion in your nose or some drainage.  This is from the oxygen used during your procedure.  There is no need for concern and it should clear up in a day or so.  SYMPTOMS TO REPORT IMMEDIATELY:   Following lower endoscopy (colonoscopy or flexible sigmoidoscopy):  Excessive amounts of blood in the stool  Significant tenderness or worsening of abdominal pains  Swelling of the abdomen that is new, acute  Fever of 100F or higher   For urgent or emergent issues, a gastroenterologist can be reached at any hour by calling [phone].   DIET:  We do recommend a small meal at first, but then you may proceed to your regular diet.  Drink plenty of fluids but you should avoid alcoholic beverages for 24  hours.  ACTIVITY:  You should plan to take it easy for the rest of today and you should NOT DRIVE or use heavy machinery until tomorrow (because of the sedation medicines used during the test).    FOLLOW UP: Our staff will call the number listed on your records 48-72 hours following your procedure to check on you and address any questions or concerns that you may have regarding the information given to you following your procedure. If we do not reach you, we will leave a message.  We will attempt to reach you two times.  During this call, we will ask if you have developed any symptoms of COVID 19. If you develop any symptoms (ie: fever, flu-like symptoms, shortness of breath, cough etc.) before then, please call [phone].  If you test positive for Covid 19 in the 2 weeks post procedure, please call and report this information to [place].    If any biopsies were taken you will be contacted by phone or by letter within the next 1-3 weeks.  Please call us at [phone] if you have not heard about the biopsies in 3 weeks.    SIGNATURES/CONFIDENTIALITY: You and/or your care partner have signed paperwork which will be entered into your electronic medical record.  These signatures attest to the fact that that the information above on your After Visit Summary has been reviewed and is understood.  Full responsibility of the confidentiality  of this discharge information lies with you and/or your care-partner.

## 2019-02-07 NOTE — Progress Notes (Signed)
Called to room to assist during endoscopic procedure.  Patient ID and intended procedure confirmed with present staff. Received instructions for my participation in the procedure from the performing physician.  

## 2019-02-07 NOTE — Progress Notes (Signed)
To PACU, VSS. Report to [email or web address] 

## 2019-02-07 NOTE — Progress Notes (Signed)
Pt's states no medical or surgical changes since previsit or office visit.  Temp JB, VS SM

## 2019-02-07 NOTE — Op Note (Signed)
[facility] Patient Name: [name] Procedure Date: 02/07/2019 7:58 AM MRN: [id number] Endoscopist: Ladene Artist , MD Age: 58 Referring MD:  Date of Birth: [birth year] Gender: Male Account #: [id number] Procedure:                Colonoscopy Indications:              High risk colon cancer surveillance: Personal                            history of sessile serrated colon polyp (less than                            10 mm in size) with no dysplasia Medicines:                Monitored Anesthesia Care Procedure:                Pre-Anesthesia Assessment:                           - Prior to the procedure, a History and Physical                            was performed, and patient medications and                            allergies were reviewed. The patient's tolerance of                            previous anesthesia was also reviewed. The risks                            and benefits of the procedure and the sedation                            options and risks were discussed with the patient.                            All questions were answered, and informed consent                            was obtained. Prior Anticoagulants: The patient has                            taken no previous anticoagulant or antiplatelet                            agents. ASA Grade Assessment: II - A patient with                            mild systemic disease. After reviewing the risks                            and benefits, the patient was deemed in  satisfactory condition to undergo the procedure.                           After obtaining informed consent, the colonoscope                            was passed under direct vision. Throughout the                            procedure, the patient's blood pressure, pulse, and                            oxygen saturations were monitored continuously. The                            Colonoscope was introduced  through the anus and                            advanced to the the cecum, identified by                            appendiceal orifice and ileocecal valve. The                            ileocecal valve, appendiceal orifice, and rectum                            were photographed. The quality of the bowel                            preparation was good. The colonoscopy was performed                            without difficulty. The patient tolerated the                            procedure well. Scope In: 8:09:53 AM Scope Out: 8:23:35 AM Scope Withdrawal Time: 0 hours 12 minutes 23 seconds  Total Procedure Duration: 0 hours 13 minutes 42 seconds  Findings:                 The perianal and digital rectal examinations were                            normal.                           A 9 mm polyp was found in the cecum. The polyp was                            sessile. The polyp was removed with a hot snare.                            Resection and retrieval were complete.  A 10 mm polyp was found in the transverse colon.                            The polyp was semi-pedunculated. The polyp was                            removed with a hot snare. Resection and retrieval                            were complete.                           A 6 mm polyp was found in the descending colon. The                            polyp was sessile. The polyp was removed with a                            cold snare. Resection and retrieval were complete.                           Internal hemorrhoids were found during                            retroflexion. The hemorrhoids were small and Grade                            I (internal hemorrhoids that do not prolapse).                           The exam was otherwise without abnormality on                            direct and retroflexion views. Complications:            No immediate complications. Estimated blood loss:                             None. Estimated Blood Loss:     Estimated blood loss: none. Impression:               - One 9 mm polyp in the cecum, removed with a hot                            snare. Resected and retrieved.                           - One 10 mm polyp in the transverse colon, removed                            with a hot snare. Resected and retrieved.                           - One 6 mm polyp in the descending colon, removed  with a cold snare. Resected and retrieved.                           - Internal hemorrhoids.                           - The examination was otherwise normal on direct                            and retroflexion views. Recommendation:           - Repeat colonoscopy after studies are complete for                            surveillance based on pathology results.                           - Patient has a contact number available for                            emergencies. The signs and symptoms of potential                            delayed complications were discussed with the                            patient. Return to normal activities tomorrow.                            Written discharge instructions were provided to the                            patient.                           - Resume previous diet.                           - Continue present medications.                           - Await pathology results.                           - No aspirin, ibuprofen, naproxen, or other                            non-steroidal anti-inflammatory drugs for 2 weeks                            after polyp removal. Ladene Artist, MD 02/07/2019 8:27:13 AM This report has been signed electronically.

## 2019-02-09 ENCOUNTER — Telehealth: Payer: Self-pay

## 2019-02-09 ENCOUNTER — Telehealth: Payer: Self-pay | Admitting: *Deleted

## 2019-02-09 NOTE — Telephone Encounter (Signed)
Left message on follow up call. 

## 2019-02-09 NOTE — Telephone Encounter (Signed)
  Follow up Call-  Call back number 02/07/2019  Post procedure Call Back phone  # [phone]  Permission to leave phone message Yes  Some recent data might be hidden     Patient questions:  Do you have a fever, pain , or abdominal swelling? No. Pain Score  0 *  Have you tolerated food without any problems? Yes.    Have you been able to return to your normal activities? Yes.    Do you have any questions about your discharge instructions: Diet   No. Medications  No. Follow up visit  No.  Do you have questions or concerns about your Care? No.  Actions: * If pain score is 4 or above: No action needed, pain <4.  1. Have you developed a fever since your procedure? no  2.   Have you had an respiratory symptoms (SOB or cough) since your procedure? no  3.   Have you tested positive for COVID 19 since your procedure no  4.   Have you had any family members/close contacts diagnosed with the COVID 19 since your procedure?  no   If yes to any of these questions please route to Joylene John, RN and Alphonsa Gin, Therapist, sports.

## 2019-02-13 ENCOUNTER — Encounter: Payer: Self-pay | Admitting: Gastroenterology

## 2019-03-12 ENCOUNTER — Other Ambulatory Visit: Payer: Self-pay | Admitting: Family Medicine

## 2019-03-19 ENCOUNTER — Other Ambulatory Visit: Payer: Self-pay | Admitting: Family Medicine

## 2019-05-22 ENCOUNTER — Other Ambulatory Visit: Payer: Self-pay | Admitting: Family Medicine

## 2019-05-28 ENCOUNTER — Telehealth: Payer: Self-pay | Admitting: Family Medicine

## 2019-05-28 NOTE — Telephone Encounter (Signed)
Called the number provided below and got disconnected during the call. Will call back.

## 2019-05-28 NOTE — Telephone Encounter (Signed)
Patient called in this morning saying that the pharmacy is needing prior authorization for his medication JANUMET XR 50-1000 MG TB24, asked to call [phone]

## 2019-05-29 ENCOUNTER — Other Ambulatory Visit: Payer: Self-pay

## 2019-05-29 MED ORDER — JANUMET XR 50-1000 MG PO TB24: 1.0000 | ORAL_TABLET | Freq: Two times a day (BID) | ORAL | 3 refills | Status: DC

## 2019-05-29 NOTE — Telephone Encounter (Signed)
PA completed online and medication sent in.

## 2019-07-02 ENCOUNTER — Encounter: Payer: Self-pay | Admitting: Family Medicine

## 2019-07-02 ENCOUNTER — Ambulatory Visit: Payer: 59 | Admitting: Family Medicine

## 2019-07-02 ENCOUNTER — Other Ambulatory Visit: Payer: Self-pay

## 2019-07-02 VITALS — BP 140/78 | HR 78 | Temp 97.5°F | Ht 69.0 in | Wt 288.2 lb

## 2019-07-02 DIAGNOSIS — E119 Type 2 diabetes mellitus without complications: Secondary | ICD-10-CM

## 2019-07-02 DIAGNOSIS — E785 Hyperlipidemia, unspecified: Secondary | ICD-10-CM

## 2019-07-02 DIAGNOSIS — I10 Essential (primary) hypertension: Secondary | ICD-10-CM

## 2019-07-02 DIAGNOSIS — K219 Gastro-esophageal reflux disease without esophagitis: Secondary | ICD-10-CM

## 2019-07-02 DIAGNOSIS — F3342 Major depressive disorder, recurrent, in full remission: Secondary | ICD-10-CM

## 2019-07-02 DIAGNOSIS — E1159 Type 2 diabetes mellitus with other circulatory complications: Secondary | ICD-10-CM

## 2019-07-02 DIAGNOSIS — E1169 Type 2 diabetes mellitus with other specified complication: Secondary | ICD-10-CM

## 2019-07-02 DIAGNOSIS — Z794 Long term (current) use of insulin: Secondary | ICD-10-CM

## 2019-07-02 LAB — POCT GLYCOSYLATED HEMOGLOBIN (HGB A1C): Hemoglobin A1C: 7.3 % — AB (ref 4.0–5.6)

## 2019-07-02 MED ORDER — ONDANSETRON HCL 8 MG PO TABS: ORAL_TABLET | ORAL | 5 refills | Status: DC

## 2019-07-02 NOTE — Patient Instructions (Addendum)
Health Maintenance Due  Topic Date Due  . OPHTHALMOLOGY EXAM will call for app  05/06/2019   Lab Results  Component Value Date   HGBA1C 7.3 (A) 07/02/2019  A1c is slightly above goal 7 or less but honestly we can probably target 8 or less at this point as we are going to lean on nph/r insulin more.   We are going to hold the janumet due to expense as well as ongoing morning nausea/vomiting. He has 3 months left and we may restart at later date depending on results.   We will increase morning nph/r to 35 units  starting tomorrow and continue 31 units before dinner  He is going to update me in 2 weeks by mychart with all of his #s. He will let me know sooner than that if any #s under 80. We likely will need to increase insulin more as janumet clears from system.   Blood pressure hair high today- lets work on lifestyle and hopefully this will come back down. Regular exercise 30 minutes a day plus dash eating plan  Recommended follow up: Return in 4 months (on 11/01/2019) for F/U HTN, HLD, DM , follow up- or sooner if needed.    DASH Eating Plan DASH stands for "Dietary Approaches to Stop Hypertension." The DASH eating plan is a healthy eating plan that has been shown to reduce high blood pressure (hypertension). It may also reduce your risk for type 2 diabetes, heart disease, and stroke. The DASH eating plan may also help with weight loss. What are tips for following this plan?  General guidelines  Avoid eating more than 2,300 mg (milligrams) of salt (sodium) a day. If you have hypertension, you may need to reduce your sodium intake to 1,500 mg a day.  Limit alcohol intake to no more than 1 drink a day for nonpregnant women and 2 drinks a day for men. One drink equals 12 oz of beer, 5 oz of wine, or 1 oz of hard liquor.  Work with your health care provider to maintain a healthy body weight or to lose weight. Ask what an ideal weight is for you.  Get at least 30 minutes of exercise that  causes your heart to beat faster (aerobic exercise) most days of the week. Activities may include walking, swimming, or biking.  Work with your health care provider or diet and nutrition specialist (dietitian) to adjust your eating plan to your individual calorie needs. Reading food labels   Check food labels for the amount of sodium per serving. Choose foods with less than 5 percent of the Daily Value of sodium. Generally, foods with less than 300 mg of sodium per serving fit into this eating plan.  To find whole grains, look for the word "whole" as the first word in the ingredient list. Shopping  Buy products labeled as "low-sodium" or "no salt added."  Buy fresh foods. Avoid canned foods and premade or frozen meals. Cooking  Avoid adding salt when cooking. Use salt-free seasonings or herbs instead of table salt or sea salt. Check with your health care provider or pharmacist before using salt substitutes.  Do not fry foods. Cook foods using healthy methods such as baking, boiling, grilling, and broiling instead.  Cook with heart-healthy oils, such as olive, canola, soybean, or sunflower oil. Meal planning  Eat a balanced diet that includes: ? 5 or more servings of fruits and vegetables each day. At each meal, try to fill half of your plate with fruits  and vegetables. ? Up to 6-8 servings of whole grains each day. ? Less than 6 oz of lean meat, poultry, or fish each day. A 3-oz serving of meat is about the same size as a deck of cards. One egg equals 1 oz. ? 2 servings of low-fat dairy each day. ? A serving of nuts, seeds, or beans 5 times each week. ? Heart-healthy fats. Healthy fats called Omega-3 fatty acids are found in foods such as flaxseeds and coldwater fish, like sardines, salmon, and mackerel.  Limit how much you eat of the following: ? Canned or prepackaged foods. ? Food that is high in trans fat, such as fried foods. ? Food that is high in saturated fat, such as fatty  meat. ? Sweets, desserts, sugary drinks, and other foods with added sugar. ? Full-fat dairy products.  Do not salt foods before eating.  Try to eat at least 2 vegetarian meals each week.  Eat more home-cooked food and less restaurant, buffet, and fast food.  When eating at a restaurant, ask that your food be prepared with less salt or no salt, if possible. What foods are recommended? The items listed may not be a complete list. Talk with your dietitian about what dietary choices are best for you. Grains Whole-grain or whole-wheat bread. Whole-grain or whole-wheat pasta. Brown rice. [place]. Bulgur. Whole-grain and low-sodium cereals. Pita bread. Low-fat, low-sodium crackers. Whole-wheat flour tortillas. Vegetables Fresh or frozen vegetables (raw, steamed, roasted, or grilled). Low-sodium or reduced-sodium tomato and vegetable juice. Low-sodium or reduced-sodium tomato sauce and tomato paste. Low-sodium or reduced-sodium canned vegetables. Fruits All fresh, dried, or frozen fruit. Canned fruit in natural juice (without added sugar). Meat and other protein foods Skinless chicken or [place]. Ground chicken or [place]. Pork with fat trimmed off. Fish and seafood. Egg whites. Dried beans, peas, or lentils. Unsalted nuts, nut butters, and seeds. Unsalted canned beans. Lean cuts of beef with fat trimmed off. Low-sodium, lean deli meat. Dairy Low-fat (1%) or fat-free (skim) milk. Fat-free, low-fat, or reduced-fat cheeses. Nonfat, low-sodium ricotta or cottage cheese. Low-fat or nonfat yogurt. Low-fat, low-sodium cheese. Fats and oils Soft margarine without trans fats. Vegetable oil. Low-fat, reduced-fat, or light mayonnaise and salad dressings (reduced-sodium). Canola, safflower, olive, soybean, and sunflower oils. Avocado. Seasoning and other foods Herbs. Spices. Seasoning mixes without salt. Unsalted popcorn and pretzels. Fat-free sweets. What foods are not recommended? The items listed  may not be a complete list. Talk with your dietitian about what dietary choices are best for you. Grains Baked goods made with fat, such as croissants, muffins, or some breads. Dry pasta or rice meal packs. Vegetables Creamed or fried vegetables. Vegetables in a cheese sauce. Regular canned vegetables (not low-sodium or reduced-sodium). Regular canned tomato sauce and paste (not low-sodium or reduced-sodium). Regular tomato and vegetable juice (not low-sodium or reduced-sodium). Angie Fava. Olives. Fruits Canned fruit in a light or heavy syrup. Fried fruit. Fruit in cream or butter sauce. Meat and other protein foods Fatty cuts of meat. Ribs. Fried meat. Berniece Salines. Sausage. Bologna and other processed lunch meats. Salami. Fatback. Hotdogs. Bratwurst. Salted nuts and seeds. Canned beans with added salt. Canned or smoked fish. Whole eggs or egg yolks. Chicken or [place] with skin. Dairy Whole or 2% milk, cream, and half-and-half. Whole or full-fat cream cheese. Whole-fat or sweetened yogurt. Full-fat cheese. Nondairy creamers. Whipped toppings. Processed cheese and cheese spreads. Fats and oils Butter. Stick margarine. Lard. Shortening. Ghee. Bacon fat. Tropical oils, such as coconut, palm kernel,  or palm oil. Seasoning and other foods Salted popcorn and pretzels. Onion salt, garlic salt, seasoned salt, table salt, and sea salt. Worcestershire sauce. Tartar sauce. Barbecue sauce. Teriyaki sauce. Soy sauce, including reduced-sodium. Steak sauce. Canned and packaged gravies. Fish sauce. Oyster sauce. Cocktail sauce. Horseradish that you find on the shelf. Ketchup. Mustard. Meat flavorings and tenderizers. Bouillon cubes. Hot sauce and Tabasco sauce. Premade or packaged marinades. Premade or packaged taco seasonings. Relishes. Regular salad dressings. Where to find more information:  National Heart, Lung, and [place]: https://wilson-eaton.com/  American Heart Association: www.heart.org Summary  The DASH  eating plan is a healthy eating plan that has been shown to reduce high blood pressure (hypertension). It may also reduce your risk for type 2 diabetes, heart disease, and stroke.  With the DASH eating plan, you should limit salt (sodium) intake to 2,300 mg a day. If you have hypertension, you may need to reduce your sodium intake to 1,500 mg a day.  When on the DASH eating plan, aim to eat more fresh fruits and vegetables, whole grains, lean proteins, low-fat dairy, and heart-healthy fats.  Work with your health care provider or diet and nutrition specialist (dietitian) to adjust your eating plan to your individual calorie needs. This information is not intended to replace advice given to you by your health care provider. Make sure you discuss any questions you have with your health care provider. Document Revised: 02/25/2017 Document Reviewed: 03/08/2016 Elsevier Patient Education  2020 [employer].

## 2019-07-02 NOTE — Progress Notes (Addendum)
Phone [phone] In person visit   Subjective:   [name] is a 59 y.o. year old very pleasant male patient who presents for/with See problem oriented charting Chief Complaint  Patient presents with  . Hyperlipidemia   This visit occurred during the SARS-CoV-2 public health emergency.  Safety protocols were in place, including screening questions prior to the visit, additional usage of staff PPE, and extensive cleaning of exam room while observing appropriate contact time as indicated for disinfecting solutions.   Past Medical History-  Patient Active Problem List   Diagnosis Date Noted  . Morbid obesity ([place]) 12/13/2008    Priority: High  . Type 2 diabetes mellitus ([place]) 03/05/2008    Priority: High  . History of melanoma. R ear.  12/16/2006    Priority: High  . Hypertension associated with diabetes ([place]) 03/04/2015    Priority: Medium  . GERD (gastroesophageal reflux disease) 03/04/2015    Priority: Medium  . Major depression, recurrent, full remission ([place]) 12/16/2006    Priority: Medium  . Hyperlipidemia associated with type 2 diabetes mellitus ([place]) 11/07/2006    Priority: Medium  . Eczema 12/02/2015    Priority: Low    Medications- reviewed and updated Current Outpatient Medications  Medication Sig Dispense Refill  . amLODipine-benazepril (LOTREL) 5-20 MG capsule TAKE 1 CAPSULE DAILY 90 capsule 3  . ARIPiprazole (ABILIFY) 5 MG tablet TAKE 1 TABLET DAILY 30 tablet 11  . Blood Glucose Monitoring Suppl (ONETOUCH VERIO FLEX SYSTEM) w/Device KIT 1 each by Does not apply route 3 (three) times daily. 1 kit 0  . glucose blood (ONETOUCH VERIO) test strip USE TO CHECK BLOOD SUGAR THREE TIMES A DAY AND PRN 100 each 5  . insulin NPH-regular Human (NOVOLIN 70/30) (70-30) 100 UNIT/ML injection INJECT 30 UNITS SUBCUTANEOUSLY BEFORE BREAKFAST AND DINNER  (IF YOU SKIP A MEAL DO NOT USE THE INSULIN) (Patient taking differently: 31 Units 2 (two) times daily with a meal. INJECT 30  UNITS SUBCUTANEOUSLY BEFORE BREAKFAST AND DINNER  (IF YOU SKIP A MEAL DO NOT USE THE INSULIN)) 60 mL 2  . ondansetron (ZOFRAN) 8 MG tablet TAKE 1 TABLET(8 MG) BY MOUTH EVERY 8 HOURS AS NEEDED FOR NAUSEA OR VOMITING 30 tablet 5  . pantoprazole (PROTONIX) 20 MG tablet TAKE 1 TABLET DAILY 90 tablet 3  . rosuvastatin (CRESTOR) 10 MG tablet TAKE 1 TABLET DAILY 90 tablet 3  . SitaGLIPtin-MetFORMIN HCl (JANUMET XR) 50-1000 MG TB24 Take 1 tablet by mouth 2 (two) times daily. 180 tablet 3  . vortioxetine HBr (TRINTELLIX) 20 MG TABS tablet Take 20 mg by mouth daily.    [name] DELICA LANCETS 14E MISC USE TO CHECK BLOOD SUGAR THREE TIMES A DAY AND PRN 100 each 5   No current facility-administered medications for this visit.     Objective:  BP 140/78   Pulse 78   Temp (!) 97.5 F (36.4 C) (Temporal)   Ht '5\' 9"'$  (1.753 m)   Wt 288 lb 3.2 oz (130.7 kg)   SpO2 97%   BMI 42.56 kg/m  Gen: NAD, resting comfortably CV: RRR no murmurs rubs or gallops Lungs: CTAB no crackles, wheeze, rhonchi Ext: 1+ edema Skin: warm, dry     Assessment and Plan   #Diabetes/morbid obesity S:Patient using 70/30 insulin typically 31 units twice a day and janumet . He does check readings at home. Recently numbers have been little high. Has had some weight gain. He does take medications daily with no missed doses. Morning sugars  150s. Evening 130s.   Janumet around $200 a month.   From last avs  "very good control today of diabetes. We are going to hold his victoza as longas home blood sugars do not trend up significantly. If this does not control sugars- could try going back on victoza and changing out metformin XR only without januvia in the long run but in short run may hold all 3. " we did this due to diarrhea and vomiting in the AM - had to use zofran. He states still waking up with a bad stomach- once he has a heave feels better- no longer vomiting every morning but taking zofran. Issues started a few months prior  to October.  Lab Results  Component Value Date   HGBA1C POC 7.3 (A) 07/02/2019   HGBA1C 5.6 01/01/2019   HGBA1C 5.8 06/08/2018   A/P: From avs "A1c is slightly above goal 7 or less but honestly we can probably target 8 or less at this point as we are going to lean on nph/r insulin more.   We are going to hold the janumet due to expense as well as ongoing morning nausea/vomiting. He has 3 months left and we may restart at later date depending on results.   We will increase morning nph/r to 35 units  starting tomorrow and continue 31 units before dinner  He is going to update me in 2 weeks by mychart with all of his #s. He will let me know sooner than that if any #s under 80. We likely will need to increase insulin more as janumet clears from system. "  -also stopped trintellix last week and wonders if that may have contributed  Gained weight with bout of depression- up a few lbs- Encouraged need for healthy eating, regular exercise, weight loss. He is trying to restart exercise Wt Readings from Last 3 Encounters:  07/02/19 288 lb 3.2 oz (130.7 kg)  02/07/19 285 lb (129.3 kg)  01/24/19 285 lb (129.3 kg)    #Hyperlipidemia S: Compliant with rosuvastatin 10 mg Lab Results  Component Value Date   CHOL 164 01/01/2019   HDL 69.40 01/01/2019   LDLCALC 64 01/01/2019   LDLDIRECT 58.0 08/04/2017   TRIG 152.0 (H) 01/01/2019   CHOLHDL 2 01/01/2019  A/P: Stable. Continue current medications. LDL at ideal goal 70 or less   #Hypertension S:Compliant with amlodipine benazepril 5-'20mg'$ . Patient denies any chest pain, shortness of breath, changes or changes in vision. Does not add salt to food. Tries to maintain a heart healthy diet.   Does not check BP at home.  BP Readings from Last 3 Encounters:  07/02/19 140/78  02/07/19 (!) 152/77  01/01/19 138/88  A/P: patient running slightly high today- he would like to work on diet/exercise and recheck at 4 month visit . If still running high consider  hctz '25mg'$   - hesitant to increase amlodipine with edema  #GERD S: Compliant with Protonix 20 mg.  Doing well with current dose.  B12 has been low normal so I recommended at least 250 mcg B12 daily. Patient compliant with over the counter B-12 supplement.  Lab Results  Component Value Date   VITAMINB12 279 08/04/2017  A/P: I would consider reducing protonix and trying pepcid but with GI issues we will hold off for now until stablized -continue b12    % #Depression-follows with psychiatry Dr. Toy Care S: Remains in full remission on Abilify '5mg'$ . He had to come off of pristiq '50mg'$ , trintellix due to cost.  No suicidal ideation A/P: hopefully remains controlled off trintellix- has close psychiatry follow up    Recommended follow up: Return in 4 months (on 11/01/2019) for F/U HTN, HLD, DM , follow up- or sooner if needed. Future Appointments  Date Time Provider [place]  01/16/2020  9:20 AM Marin Olp, MD [facility]    Lab/Order associations:   ICD-10-CM   1. Type 2 diabetes mellitus without complication, with long-term current use of insulin (HCC)  E11.9 POCT glycosylated hemoglobin (Hb A1C)   Z79.4   2. Major depression, recurrent, full remission ([place])  F33.42   3. Hyperlipidemia associated with type 2 diabetes mellitus ([place])  E11.69    E78.5   4. Hypertension associated with diabetes ([place])  E11.59    I10   5. Gastroesophageal reflux disease without esophagitis  K21.9     Meds ordered this encounter  Medications  . ondansetron (ZOFRAN) 8 MG tablet    Sig: TAKE 1 TABLET(8 MG) BY MOUTH EVERY 8 HOURS AS NEEDED FOR NAUSEA OR VOMITING    Dispense:  30 tablet    Refill:  5   Return precautions advised.  Garret Reddish, MD

## 2019-07-02 NOTE — Assessment & Plan Note (Signed)
#  Diabetes/morbid obesity S:Patient using 70/30 insulin typically 31 units twice a day and janumet . He does check readings at home. Recently numbers have been little high. Has had some weight gain. He does take medications daily with no missed doses. Morning sugars 150s. Evening 130s.   Janumet around $200 a month.   From last avs  "very good control today of diabetes. We are going to hold his victoza as longas home blood sugars do not trend up significantly. If this does not control sugars- could try going back on victoza and changing out metformin XR only without januvia in the long run but in short run may hold all 3. " we did this due to diarrhea and vomiting in the AM - had to use zofran. He states still waking up with a bad stomach- once he has a heave feels better- no longer vomiting every morning but taking zofran. Issues started a few months prior to October.  Lab Results  Component Value Date   HGBA1C POC 7.3 (A) 07/02/2019   HGBA1C 5.6 01/01/2019   HGBA1C 5.8 06/08/2018   A/P: From avs "A1c is slightly above goal 7 or less but honestly we can probably target 8 or less at this point as we are going to lean on nph/r insulin more.   We are going to hold the janumet due to expense as well as ongoing morning nausea/vomiting. He has 3 months left and we may restart at later date depending on results.   We will increase morning nph/r to 35 units  starting tomorrow and continue 31 units before dinner  He is going to update me in 2 weeks by mychart with all of his #s. He will let me know sooner than that if any #s under 80. We likely will need to increase insulin more as janumet clears from system. "  -also stopped trintellix last week and wonders if that may have contributed  Gained weight with bout of depression- up a few lbs- Encouraged need for healthy eating, regular exercise, weight loss. He is trying to restart exercise Wt Readings from Last 3 Encounters:  07/02/19 288 lb 3.2 oz  (130.7 kg)  02/07/19 285 lb (129.3 kg)  01/24/19 285 lb (129.3 kg)

## 2019-07-02 NOTE — Assessment & Plan Note (Signed)
S:Compliant with amlodipine benazepril 5-20mg . Patient denies any chest pain, shortness of breath, changes or changes in vision. Does not add salt to food. Tries to maintain a heart healthy diet.   Does not check BP at home.  BP Readings from Last 3 Encounters:  07/02/19 140/78  02/07/19 (!) 152/77  01/01/19 138/88  A/P: patient running slightly high today- he would like to work on diet/exercise and recheck at 4 month visit . If still running high consider hctz 25mg 

## 2019-08-07 ENCOUNTER — Other Ambulatory Visit: Payer: Self-pay

## 2019-08-07 MED ORDER — PANTOPRAZOLE SODIUM 20 MG PO TBEC: 20.0000 mg | DELAYED_RELEASE_TABLET | Freq: Every day | ORAL | 3 refills | Status: DC

## 2019-08-07 MED ORDER — ROSUVASTATIN CALCIUM 10 MG PO TABS: 10.0000 mg | ORAL_TABLET | Freq: Every day | ORAL | 3 refills | Status: DC

## 2019-10-30 NOTE — Patient Instructions (Addendum)
Health Maintenance Due  Topic Date Due  . OPHTHALMOLOGY EXAM  Will call for appointment  05/06/2019  . INFLUENZA VACCINE  Do not have in office yet- may have at 1-2 month follow up 10/28/2019   Continue to consider the covid 19 vaccination. Home vaccine google search or [facility] are good resources.   Poor control of diabetes with A1c up to 12 off of Janumet and Victoza (stomach issues improved off of these).  We are going to increase insulin 7030 to 41 units in the morning from 35 units before breakfast and continue 31 units in the evening before dinner.  I asked him to update me in my chart in 1 week with how sugars are doing and we will further adjust. Also needs to increase hydration. -will go back on remaining janumet- he will let me know if nausea in morning resumes and he will let me know when has 2 weeks left.  still with slightly poor control of blood pressure-we are going to stop the amlodipine benazepril 5-20 mg and change to amlodipine benazepril 5-40 mg.  We will recheck at follow-up-I would like to do 1 2 months-we cannot check A1c at that time but we can recheck blood pressure and make sure blood sugar trend is improving  Also prior to 4 month physical schedule a half way check [place].   poor control of depression- has been a while since he has seen Dr. Toy Care- strongly advised follow up. Discussed if worsening/deeper thoughts of self harm or depression to call us or 911 immediately

## 2019-10-30 NOTE — Progress Notes (Signed)
Phone [phone] In person visit   Subjective:   [name] is a 59 y.o. year old very pleasant male patient who presents for/with See problem oriented charting Chief Complaint  Patient presents with  . Diabetes   This visit occurred during the SARS-CoV-2 public health emergency.  Safety protocols were in place, including screening questions prior to the visit, additional usage of staff PPE, and extensive cleaning of exam room while observing appropriate contact time as indicated for disinfecting solutions.   Past Medical History-  Patient Active Problem List   Diagnosis Date Noted  . Morbid obesity ([place]) 12/13/2008    Priority: High  . Type 2 diabetes mellitus ([place]) 03/05/2008    Priority: High  . History of melanoma. R ear.  12/16/2006    Priority: High  . Hypertension associated with diabetes ([place]) 03/04/2015    Priority: Medium  . GERD (gastroesophageal reflux disease) 03/04/2015    Priority: Medium  . Major depression, recurrent, full remission ([place]) 12/16/2006    Priority: Medium  . Hyperlipidemia associated with type 2 diabetes mellitus ([place]) 11/07/2006    Priority: Medium  . Eczema 12/02/2015    Priority: Low    Medications- reviewed and updated Current Outpatient Medications  Medication Sig Dispense Refill  . ARIPiprazole (ABILIFY) 5 MG tablet TAKE 1 TABLET DAILY 30 tablet 11  . Blood Glucose Monitoring Suppl (ONETOUCH VERIO FLEX SYSTEM) w/Device KIT 1 each by Does not apply route 3 (three) times daily. 1 kit 0  . glucose blood (ONETOUCH VERIO) test strip USE TO CHECK BLOOD SUGAR THREE TIMES A DAY AND PRN 100 each 5  . insulin NPH-regular Human (NOVOLIN 70/30) (70-30) 100 UNIT/ML injection INJECT 30 UNITS SUBCUTANEOUSLY BEFORE BREAKFAST AND DINNER  (IF YOU SKIP A MEAL DO NOT USE THE INSULIN) (Patient taking differently: 31 Units 2 (two) times daily with a meal. INJECT 30 UNITS SUBCUTANEOUSLY BEFORE BREAKFAST AND DINNER  (IF YOU SKIP A MEAL DO NOT USE THE  INSULIN)) 60 mL 2  . ondansetron (ZOFRAN) 8 MG tablet TAKE 1 TABLET(8 MG) BY MOUTH EVERY 8 HOURS AS NEEDED FOR NAUSEA OR VOMITING 30 tablet 5  . ONETOUCH DELICA LANCETS 86V MISC USE TO CHECK BLOOD SUGAR THREE TIMES A DAY AND PRN 100 each 5  . pantoprazole (PROTONIX) 20 MG tablet Take 1 tablet (20 mg total) by mouth daily. 90 tablet 3  . rosuvastatin (CRESTOR) 10 MG tablet Take 1 tablet (10 mg total) by mouth daily. 90 tablet 3  . amLODipine-benazepril (LOTREL) 5-40 MG capsule Take 1 capsule by mouth daily. 90 capsule 3  . SitaGLIPtin-MetFORMIN HCl (JANUMET XR) 50-1000 MG TB24 Take 1 tablet by mouth 2 (two) times daily. (Patient not taking: Reported on 10/31/2019) 180 tablet 3   No current facility-administered medications for this visit.     Objective:  BP (!) 148/80   Pulse 88   Temp 98.1 F (36.7 C) (Temporal)   Ht '5\' 9"'$  (1.753 m)   Wt 283 lb (128.4 kg)   SpO2 95%   BMI 41.79 kg/m  Gen: NAD, resting comfortably CV: RRR no murmurs rubs or gallops Lungs: CTAB no crackles, wheeze, rhonchi Ext: 1+ edema Skin: warm, dry     Assessment and Plan   #Diabetes/morbid obesity S:Patient using 70/30 insulin typically 31 units twice a day last visit but we increased to 35 in AM and continued 31 in PM as we were stopping janumet and had stopped victoza previously. Plan was for 2 week mychart update but  that did not occur.    We opted to hold janumet due to GI issues. and janumet also costly $200 a month. We also previously held victoza/ liraglutide. - diarrhea and vomiting in AM   Sugars at home 190s in the morning. After evening meal over 200.   Lab Results  Component Value Date   HGBA1C 12.0 (A) 10/31/2019   HGBA1C 7.3 (A) 07/02/2019   HGBA1C 5.6 01/01/2019   A/P: Poor control of diabetes with A1c up to 12 off of Janumet and Victoza (stomach issues improved off of these).  We are going to increase insulin 7030 to 41 units in the morning from 35 units before breakfast and continue 31  units in the evening before dinner.  I asked him to update me in my chart in 1 week with how sugars are doing and we will further adjust. Also needs to increase hydration.  -will go back on remaining janumet- he will let me know if nausea in morning resumes and he will let me know when has 2 weeks left. After he finishes this due to cost likely go to metformin extended release '500mg'$ - take 2 twice daily #360.  -weight down 5 lbs but could be due to high sugars -Any low blood sugars he will let us know immediately   #Hyperlipidemia S: Compliant with rosuvastatin 10 mg Lab Results  Component Value Date   CHOL 164 01/01/2019   HDL 69.40 01/01/2019   LDLCALC 64 01/01/2019   LDLDIRECT 58.0 08/04/2017   TRIG 152.0 (H) 01/01/2019   CHOLHDL 2 01/01/2019  A/P: Excellent control last check-continue current medications  #Hypertension S:Compliant with amlodipine benazepril 5-'20mg'$   . Not checking at home. No CP or SOB.  BP Readings from Last 3 Encounters:  10/31/19 (!) 148/80  07/02/19 140/78  02/07/19 (!) 152/77   A/P: Mild poor control on last check-today still with slightly poor control of blood pressure-we are going to stop the amlodipine benazepril 5-20 mg and change to amlodipine benazepril 5-40 mg.  We will recheck at follow-up-I would like to do 1 2 months-we cannot check A1c at that time but we can recheck blood pressure and make sure blood sugar trend is improving  #GERD S: Compliant with Protonix 20 mg.  Doing well with current dose.  B12 has been low normal so I recommended at least 250 mcg B12 daily- or 1,000 mcg once a week A/P: reasonable control- check b12 with next labs with high risk med use (now in b12)    % #Depression-follows with psychiatry Dr. Toy Care S: Remains in full remission on Abilify '5mg'$ . He had to come off of pristiq '50mg'$ --> later switched to trintellix due to cost. Also had another med which was even more expensive  Has had lifelong issues with some thoughts of self  arm. No plan in place. Psyciatry is well aware- even when depression is better controlled he still may have issues.  A/P: poor control of depression- has been a while since he has seen Dr. Toy Care- strongly advised follow up. Discussed if worsening/deeper thoughts of self harm or depression to call us or 911 immediately   #labs likely in 2 visits (4 months)  Recommended follow up:  1-2 month follow up.  Return in about 4 months (around 03/01/2020) for cpe DM, HTN, GERD, Phq9 after 01/01/2020. Future Appointments  Date Time Provider [place]  01/16/2020  9:20 AM Marin Olp, MD [facility]    Lab/Order associations:   ICD-10-CM   1.  Hypertension associated with diabetes ([place])  E11.59    I10   2. Gastroesophageal reflux disease without esophagitis  K21.9   3. Hyperlipidemia associated with type 2 diabetes mellitus ([place])  E11.69    E78.5   4. Type 2 diabetes mellitus without complication, with long-term current use of insulin (HCC)  E11.9 POCT glycosylated hemoglobin (Hb A1C)   Z79.4   5. Major depression, recurrent, full remission ([place])  F33.42     Meds ordered this encounter  Medications  . amLODipine-benazepril (LOTREL) 5-40 MG capsule    Sig: Take 1 capsule by mouth daily.    Dispense:  90 capsule    Refill:  3   Return precautions advised.  Garret Reddish, MD

## 2019-10-31 ENCOUNTER — Encounter: Payer: Self-pay | Admitting: Family Medicine

## 2019-10-31 ENCOUNTER — Other Ambulatory Visit: Payer: Self-pay

## 2019-10-31 ENCOUNTER — Ambulatory Visit (INDEPENDENT_AMBULATORY_CARE_PROVIDER_SITE_OTHER): Payer: 59 | Admitting: Family Medicine

## 2019-10-31 VITALS — BP 148/80 | HR 88 | Temp 98.1°F | Ht 69.0 in | Wt 283.0 lb

## 2019-10-31 DIAGNOSIS — E119 Type 2 diabetes mellitus without complications: Secondary | ICD-10-CM | POA: Diagnosis not present

## 2019-10-31 DIAGNOSIS — K219 Gastro-esophageal reflux disease without esophagitis: Secondary | ICD-10-CM | POA: Diagnosis not present

## 2019-10-31 DIAGNOSIS — F3342 Major depressive disorder, recurrent, in full remission: Secondary | ICD-10-CM

## 2019-10-31 DIAGNOSIS — E1169 Type 2 diabetes mellitus with other specified complication: Secondary | ICD-10-CM | POA: Diagnosis not present

## 2019-10-31 DIAGNOSIS — I10 Essential (primary) hypertension: Secondary | ICD-10-CM

## 2019-10-31 DIAGNOSIS — E1159 Type 2 diabetes mellitus with other circulatory complications: Secondary | ICD-10-CM

## 2019-10-31 DIAGNOSIS — Z794 Long term (current) use of insulin: Secondary | ICD-10-CM

## 2019-10-31 DIAGNOSIS — E785 Hyperlipidemia, unspecified: Secondary | ICD-10-CM

## 2019-10-31 LAB — POCT GLYCOSYLATED HEMOGLOBIN (HGB A1C): Hemoglobin A1C: 12 % — AB (ref 4.0–5.6)

## 2019-10-31 MED ORDER — AMLODIPINE BESY-BENAZEPRIL HCL 5-40 MG PO CAPS: 1.0000 | ORAL_CAPSULE | Freq: Every day | ORAL | 3 refills | Status: DC

## 2019-11-06 ENCOUNTER — Telehealth: Payer: Self-pay | Admitting: Family Medicine

## 2019-11-06 MED ORDER — NOVOLIN 70/30 (70-30) 100 UNIT/ML ~~LOC~~ SUSP: 31.0000 [IU] | Freq: Two times a day (BID) | SUBCUTANEOUS | 2 refills | Status: DC

## 2019-11-06 NOTE — Telephone Encounter (Signed)
Medication has been sent to the patient's pharmacy.  

## 2019-11-06 NOTE — Telephone Encounter (Signed)
..   LAST APPOINTMENT DATE: 10/31/2019   NEXT APPOINTMENT DATE:@10 /20/2021  MEDICATION:insulin NPH-regular Human (NOVOLIN 70/30) (70-30) 100 UNIT/ML injection  [place] ([place], [place]  **Let patient know to contact pharmacy at the end of the day to make sure medication is ready. **  ** Please notify patient to allow 48-72 hours to process**  **Encourage patient to contact the pharmacy for refills or they can request refills through [facility]**  CLINICAL FILLS OUT ALL BELOW:   LAST REFILL:  QTY:  REFILL DATE:    OTHER COMMENTS:    Okay for refill?  Please advise

## 2019-11-14 ENCOUNTER — Telehealth: Payer: Self-pay

## 2019-11-14 NOTE — Telephone Encounter (Signed)
.   LAST APPOINTMENT DATE: 11/06/2019   NEXT APPOINTMENT DATE:@10 /20/2021  MEDICATION:insulin NPH-regular Human (NOVOLIN 70/30) (70-30) 100 UNIT/ML injection

## 2019-11-15 ENCOUNTER — Other Ambulatory Visit: Payer: Self-pay

## 2019-11-15 MED ORDER — NOVOLIN 70/30 (70-30) 100 UNIT/ML ~~LOC~~ SUSP: 31.0000 [IU] | Freq: Two times a day (BID) | SUBCUTANEOUS | 2 refills | Status: DC

## 2019-11-15 NOTE — Telephone Encounter (Signed)
Called patient mail in never received script. I have resent he will call if any issues.

## 2020-01-16 ENCOUNTER — Encounter: Payer: 59 | Admitting: Family Medicine

## 2020-03-05 NOTE — Progress Notes (Signed)
Phone [phone]   Subjective:  Patient presents today for their annual physical. Chief complaint-noted.   See problem oriented charting- Review of Systems  Constitutional: Negative for chills and fever.  HENT: Negative for hearing loss and nosebleeds.   Eyes: Negative for blurred vision and double vision.  Respiratory: Positive for cough (recovered from cold last week - over 10 days from 1st symptom). Negative for shortness of breath.   Cardiovascular: Negative for chest pain and palpitations.  Gastrointestinal: Negative for constipation, diarrhea, nausea and vomiting.  Genitourinary: Negative for dysuria and frequency.  Musculoskeletal: Negative for joint pain and myalgias.  Skin: Negative for itching and rash.  Neurological: Negative for dizziness and headaches.  Endo/Heme/Allergies: Negative for polydipsia. Does not bruise/bleed easily.  Psychiatric/Behavioral: Positive for suicidal ideas (has had lifelong issues with this but no plans and improving with Dr. Carie Caddy help now on vibryyd). Negative for hallucinations and substance abuse.   The following were reviewed and entered/updated in epic: Past Medical History:  Diagnosis Date  . Cancer (HCC)    melanoma;  right ear  . Depression   . Diabetes mellitus   . GERD (gastroesophageal reflux disease)   . Hyperlipidemia   . Hypertension   . Metabolic syndrome   . Obesity    Patient Active Problem List   Diagnosis Date Noted  . Morbid obesity (HCC) 12/13/2008    Priority: High  . Type 2 diabetes mellitus (HCC) 03/05/2008    Priority: High  . History of melanoma. R ear.  12/16/2006    Priority: High  . Hypertension associated with diabetes (HCC) 03/04/2015    Priority: Medium  . GERD (gastroesophageal reflux disease) 03/04/2015    Priority: Medium  . Major depression, recurrent, full remission (HCC) 12/16/2006    Priority: Medium  . Hyperlipidemia associated with type 2 diabetes mellitus (HCC) 11/07/2006    Priority:  Medium  . Eczema 12/02/2015    Priority: Low   Past Surgical History:  Procedure Laterality Date  . COLONOSCOPY  05/01/2013  . CYST REMOVAL TRUNK  1989   benign  . MELANOMA EXCISION  2008   right ear and neck  . POLYPECTOMY    . reconstructive plastic surgery face  1967   dog bite at age 84; about 15 surgeries for 10 years  . TONSILLECTOMY  1966   age 19    Family History  Problem Relation Age of Onset  . Arthritis Mother   . Stroke Father 62       heavy smoker 4-5 packs a day unfiltered  . Schizophrenia Father        homeless and did not get medical care in time  . Alcoholism Father   . Colon cancer Neg Hx   . Esophageal cancer Neg Hx   . Rectal cancer Neg Hx   . Stomach cancer Neg Hx   . Colon polyps Neg Hx     Medications- reviewed and updated Current Outpatient Medications  Medication Sig Dispense Refill  . amLODipine-benazepril (LOTREL) 5-40 MG capsule Take 1 capsule by mouth daily. 90 capsule 3  . ARIPiprazole (ABILIFY) 5 MG tablet TAKE 1 TABLET DAILY 30 tablet 11  . Blood Glucose Monitoring Suppl (ONETOUCH VERIO FLEX SYSTEM) w/Device KIT 1 each by Does not apply route 3 (three) times daily. 1 kit 0  . glucose blood (ONETOUCH VERIO) test strip USE TO CHECK BLOOD SUGAR THREE TIMES A DAY AND PRN 100 each 5  . insulin NPH-regular Human (NOVOLIN 70/30) (70-30) 100 UNIT/ML injection  Inject 31 Units into the skin 2 (two) times daily with a meal. INJECT 30 UNITS SUBCUTANEOUSLY BEFORE BREAKFAST AND DINNER  (IF YOU SKIP A MEAL DO NOT USE THE INSULIN) (Patient taking differently: Inject 31 Units into the skin 2 (two) times daily with a meal. INJECT 31 UNITS SUBCUTANEOUSLY BEFORE BREAKFAST AND DINNER  (IF YOU SKIP A MEAL DO NOT USE THE INSULIN)) 60 mL 2  . ondansetron (ZOFRAN) 8 MG tablet TAKE 1 TABLET(8 MG) BY MOUTH EVERY 8 HOURS AS NEEDED FOR NAUSEA OR VOMITING 30 tablet 5  . ONETOUCH DELICA LANCETS 93X MISC USE TO CHECK BLOOD SUGAR THREE TIMES A DAY AND PRN 100 each 5  .  pantoprazole (PROTONIX) 20 MG tablet Take 1 tablet (20 mg total) by mouth daily. 90 tablet 3  . rosuvastatin (CRESTOR) 10 MG tablet Take 1 tablet (10 mg total) by mouth daily. 90 tablet 3  . SitaGLIPtin-MetFORMIN HCl (JANUMET XR) 50-1000 MG TB24 Take 1 tablet by mouth 2 (two) times daily. 180 tablet 3  . Vilazodone HCl (VIIBRYD) 10 MG TABS Take 10 mg by mouth daily.     No current facility-administered medications for this visit.    Allergies-reviewed and updated Allergies  Allergen Reactions  . Codeine Nausea And Vomiting  . Food Hives    Coconut flakes    Social History   Social History Narrative   Single. 1 dog, 2 cats. Lives alone.       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 Enjoys dinner parties.    Objective  Objective:  BP 138/86   Pulse 81   Temp 97.8 F (36.6 C) (Temporal)   Ht $R'5\' 9"'bR$  (1.753 m)   Wt 286 lb (129.7 kg)   SpO2 95%   BMI 42.23 kg/m  Gen: NAD, resting comfortably HEENT: Mucous membranes are moist. Oropharynx normal. Scars from prior surgery Neck: no thyromegaly CV: RRR no murmurs rubs or gallops Lungs: CTAB no crackles, wheeze, rhonchi Abdomen: soft/nontender/nondistended/normal bowel sounds. No rebound or guarding.  Ext: no edema Skin: warm, dry Neuro: grossly normal, moves all extremities, PERRLA Diabetic Foot Exam - Simple   Simple Foot Form Diabetic Foot exam was performed with the following findings: Yes 03/06/2020  8:54 AM  Visual Inspection No deformities, no ulcerations, no other skin breakdown bilaterally: Yes Sensation Testing Intact to touch and monofilament testing bilaterally: Yes Pulse Check Posterior Tibialis and Dorsalis pulse intact bilaterally: Yes Comments       Assessment and Plan   59 y.o. male presenting for annual physical.   Health Maintenance counseling: 1. Anticipatory guidance: Patient counseled regarding regular dental exams -q6 months, eye exams - yearly but overdue,  avoiding smoking and second hand smoke , limiting alcohol to 2 beverage per day- 5 a week .   2. Risk factor reduction:  Advised patient of need for regular exercise and diet rich and fruits and vegetables to reduce risk of heart attack and stroke. Exercise- was improving but with recent start of new job has been more challenging. Diet-has improved food choices- doing less saturated fat, working on portion sizes.  Wt Readings from Last 3 Encounters:  03/06/20 286 lb (129.7 kg)  10/31/19 283 lb (128.4 kg)  07/02/19 288 lb 3.2 oz (130.7 kg)  3. Immunizations/screenings/ancillary studies- flu shot today.  Needs to schedule diabetic eye exam.  Immunization History  Administered Date(s) Administered  . Influenza Split 02/04/2011, 12/15/2011  . Influenza Whole 03/14/2009  . Influenza,inj,Quad PF,6+ Mos 01/26/2013, 12/28/2013, 03/04/2015, 12/02/2015, 03/30/2017, 12/08/2017, 01/01/2019, 03/06/2020  . PFIZER SARS-COV-2 Vaccination 12/07/2019, 12/28/2019  . Pneumococcal Polysaccharide-23 03/04/2015  . Td 03/29/2006  . Tdap 04/02/2016  . Zoster Recombinat (Shingrix) 08/03/2016, 10/08/2016  4. Prostate cancer screening- low risk psa trend, will trend with labs today.  Lab Results  Component Value Date   PSA 0.97 01/01/2019   PSA 0.81 07/27/2016   PSA 0.46 01/27/2010  5. Colon cancer screening - 02/07/2019 with 3 year repeat due to polyp history 6. Skin cancer screening- [place] [facility] usually yearly. advised regular sunscreen use. Denies worrisome, changing, or new skin lesions.  7. Std Screening- declines- not dating 8. Former smoker- 2.5 pack years and quit in 1990. Already had AAA screen based off CT abd/pelvis in 2013 and negative. No other regular screening  Status of chronic or acute concerns   #Diabetes/morbid obesity S:Patient using 70/30  insulin typically 41 increased from 31 units last visit in the morning and up to 35 units before dinner up from 31 units before dinner.  Also back on janumet- more affordable with new insurance.   Last visit A1c was very poorly controlled but was off Victoza/liraglutide (prior diarrhea and vomiting in the morning) and Janumet (due to cost of $200 a month).  Plan was to finish his remaining Janumet and then likely convert to Metformin extended release which I believe would be less expensive     Plan was for 1 to 76-month follow-up but I am thankful patient released is here today approximately 4 months later  cbgs down 130-140 in the morning Lab Results  Component Value Date   HGBA1C 12.0 (A) 10/31/2019   HGBA1C 7.3 (A) 07/02/2019   HGBA1C 5.6 01/01/2019  A/P: hopefully improved- update a1c.  continue 41 units insulin in the AM and 35 in the PM most likely. Continue janumet. Consider jardiance if a1c is above 8 and may tweak meds- otherwise likely work on lifestyle   #Hyperlipidemia S: Compliant with rosuvastatin 10 mg Lab Results  Component Value Date   CHOL 164 01/01/2019   HDL 69.40 01/01/2019   LDLCALC 64 01/01/2019   LDLDIRECT 58.0 08/04/2017   TRIG 152.0 (H) 01/01/2019   CHOLHDL 2 01/01/2019   A/P: Excellent control last year-update lipid panel today  #Hypertension S:Compliant with amlodipine benazepril 5-40mg  increased from 5-20 mg last visit.  Not checking BP at home BP Readings from Last 3 Encounters:  03/06/20 138/86  10/31/19 (!) 148/80  07/02/19 140/78   A/P: blood pressure improved but high acceptable range. Encouraged home monitoring and if above 135/85 on average consider tweaking medications further. for now continue current medicine    #GERD S: Compliant with Protonix 20 mg.  Doing well with current dose.  B12 has been low normal so I recommended at least 250 mcg B12 daily- he is not taking  A/P: Reflux remains controlled.  Trial pepcid over the counter twice daily  and if reflux is controlled continue this. If not controlled- then can go back to protonix and update me on mychart Update B12 labs today as not on.    % #Depression-follows with psychiatry Dr. Toy Care S: Remains in full remission on Abilify 5mg , vibryyd has been added and very helpful.  lifelong suicidal ideation but without plan and not progressive. In past- He had to come off of  pristiq '50mg'$ , trintellix due to cost.  A/P: good control on pq9- continue current medications Depression screen [facility] 2/9 03/06/2020 10/31/2019 07/02/2019  Decreased Interest 1 1 0  Down, Depressed, Hopeless 0 3 0  PHQ - 2 Score 1 4 0  Altered sleeping 0 1 0  Tired, decreased energy 0 0 0  Change in appetite 0 0 0  Feeling bad or failure about yourself  $RemoveB'1 1 1  'XzYbWfqv$ Trouble concentrating 0 0 0  Moving slowly or fidgety/restless 0 0 0  Suicidal thoughts 0 1 0  PHQ-9 Score $RemoveBef'2 7 1  'yRrkUvVKMU$ Difficult doing work/chores Not difficult at all Not difficult at all Not difficult at all   Recommended follow up:  Return in about 4 months (around 07/05/2020) for follow up- or sooner if needed such as if a1c still over 9.  Lab/Order associations:   ICD-10-CM   1. Preventative health care  Z00.00 Vitamin B12    Hemoglobin A1c    CBC With Differential/Platelet    COMPLETE METABOLIC PANEL WITH GFR    Lipid Panel w/reflex Direct LDL    PSA  2. Hypertension associated with diabetes ([place])  E11.59    I15.2   3. Gastroesophageal reflux disease without esophagitis  K21.9   4. Hyperlipidemia associated with type 2 diabetes mellitus ([place])  E11.69    E78.5   5. Type 2 diabetes mellitus without complication, with long-term current use of insulin (HCC)  E11.9 Hemoglobin A1c   Z79.4 CBC With Differential/Platelet    COMPLETE METABOLIC PANEL WITH GFR    Lipid Panel w/reflex Direct LDL  6. Major depression, recurrent, full remission ([place])  F33.42   7. Need for immunization against influenza  Z23 Flu Vaccine QUAD 36+ mos IM  8. Screening for prostate cancer   Z12.5 PSA  9. High risk medication use  Z79.899 Vitamin B12   Return precautions advised.  Garret Reddish, MD

## 2020-03-05 NOTE — Patient Instructions (Addendum)
Health Maintenance Due  Topic Date Due  . OPHTHALMOLOGY EXAM - please call to schedule this 05/06/2019   blood pressure improved but high acceptable range. Encouraged home monitoring and if above 135/85 on average consider tweaking medications further. for now continue current medicine    Trial pepcid at least 10 mg over the counter twice daily and if reflux is controlled continue this. If pepcid works- we can try to send this in ut not always covered by insurance since OTC  Please stop by lab before you go If you have mychart- we will send your results within 3 business days of [place] receiving them.  If you do not have mychart- we will call you about results within 5 business days of [place] receiving them.  *please note we are currently using [facility] which has a longer processing time than [place] typically so labs may not come back as quickly as in the past *please also note that you will see labs on mychart as soon as they post. I will later go in and write notes on them- will say "notes from Dr. Yong Channel"

## 2020-03-06 ENCOUNTER — Other Ambulatory Visit: Payer: Self-pay

## 2020-03-06 ENCOUNTER — Encounter: Payer: Self-pay | Admitting: Family Medicine

## 2020-03-06 ENCOUNTER — Ambulatory Visit (INDEPENDENT_AMBULATORY_CARE_PROVIDER_SITE_OTHER): Payer: BC Managed Care – PPO | Admitting: Family Medicine

## 2020-03-06 ENCOUNTER — Ambulatory Visit: Payer: 59 | Admitting: Family Medicine

## 2020-03-06 VITALS — BP 138/86 | HR 81 | Temp 97.8°F | Ht 69.0 in | Wt 286.0 lb

## 2020-03-06 DIAGNOSIS — Z125 Encounter for screening for malignant neoplasm of prostate: Secondary | ICD-10-CM

## 2020-03-06 DIAGNOSIS — K219 Gastro-esophageal reflux disease without esophagitis: Secondary | ICD-10-CM

## 2020-03-06 DIAGNOSIS — E1159 Type 2 diabetes mellitus with other circulatory complications: Secondary | ICD-10-CM | POA: Diagnosis not present

## 2020-03-06 DIAGNOSIS — Z0000 Encounter for general adult medical examination without abnormal findings: Secondary | ICD-10-CM

## 2020-03-06 DIAGNOSIS — Z794 Long term (current) use of insulin: Secondary | ICD-10-CM

## 2020-03-06 DIAGNOSIS — E785 Hyperlipidemia, unspecified: Secondary | ICD-10-CM

## 2020-03-06 DIAGNOSIS — Z23 Encounter for immunization: Secondary | ICD-10-CM

## 2020-03-06 DIAGNOSIS — Z79899 Other long term (current) drug therapy: Secondary | ICD-10-CM

## 2020-03-06 DIAGNOSIS — E119 Type 2 diabetes mellitus without complications: Secondary | ICD-10-CM

## 2020-03-06 DIAGNOSIS — E1169 Type 2 diabetes mellitus with other specified complication: Secondary | ICD-10-CM | POA: Diagnosis not present

## 2020-03-06 DIAGNOSIS — I152 Hypertension secondary to endocrine disorders: Secondary | ICD-10-CM

## 2020-03-06 DIAGNOSIS — F3342 Major depressive disorder, recurrent, in full remission: Secondary | ICD-10-CM

## 2020-03-07 LAB — CBC WITH DIFFERENTIAL/PLATELET
Absolute Monocytes: 334 cells/uL (ref 200–950)
Basophils Absolute: 21 cells/uL (ref 0–200)
Basophils Relative: 0.4 %
Eosinophils Absolute: 334 cells/uL (ref 15–500)
Eosinophils Relative: 6.3 %
HCT: 44.8 % (ref 38.5–50.0)
Hemoglobin: 15 g/dL (ref 13.2–17.1)
Lymphs Abs: 1357 cells/uL (ref 850–3900)
MCH: 28.7 pg (ref 27.0–33.0)
MCHC: 33.5 g/dL (ref 32.0–36.0)
MCV: 85.7 fL (ref 80.0–100.0)
MPV: 9.8 fL (ref 7.5–12.5)
Monocytes Relative: 6.3 %
Neutro Abs: 3254 cells/uL (ref 1500–7800)
Neutrophils Relative %: 61.4 %
Platelets: 211 10*3/uL (ref 140–400)
RBC: 5.23 10*6/uL (ref 4.20–5.80)
RDW: 12.3 % (ref 11.0–15.0)
Total Lymphocyte: 25.6 %
WBC: 5.3 10*3/uL (ref 3.8–10.8)

## 2020-03-07 LAB — COMPLETE METABOLIC PANEL WITH GFR
AG Ratio: 1.6 (calc) (ref 1.0–2.5)
ALT: 40 U/L (ref 9–46)
AST: 41 U/L — ABNORMAL HIGH (ref 10–35)
Albumin: 4.2 g/dL (ref 3.6–5.1)
Alkaline phosphatase (APISO): 52 U/L (ref 35–144)
BUN: 10 mg/dL (ref 7–25)
CO2: 28 mmol/L (ref 20–32)
Calcium: 9.4 mg/dL (ref 8.6–10.3)
Chloride: 100 mmol/L (ref 98–110)
Creat: 1.01 mg/dL (ref 0.70–1.25)
GFR, Est African American: 93 mL/min/{1.73_m2} (ref >=60)
GFR, Est Non African American: 80 mL/min/{1.73_m2} (ref >=60)
Globulin: 2.7 g/dL (calc) (ref 1.9–3.7)
Glucose, Bld: 242 mg/dL — ABNORMAL HIGH (ref 65–99)
Potassium: 4.1 mmol/L (ref 3.5–5.3)
Sodium: 137 mmol/L (ref 135–146)
Total Bilirubin: 0.3 mg/dL (ref 0.2–1.2)
Total Protein: 6.9 g/dL (ref 6.1–8.1)

## 2020-03-07 LAB — HEMOGLOBIN A1C
Hgb A1c MFr Bld: 10.7 % of total Hgb — ABNORMAL HIGH (ref <5.7)
Mean Plasma Glucose: 260 mg/dL
eAG (mmol/L): 14.4 mmol/L

## 2020-03-07 LAB — LIPID PANEL W/REFLEX DIRECT LDL
Cholesterol: 150 mg/dL (ref <200)
HDL: 37 mg/dL — ABNORMAL LOW (ref >=40)
LDL Cholesterol (Calc): 79 mg/dL (calc)
Non-HDL Cholesterol (Calc): 113 mg/dL (calc) (ref <130)
Total CHOL/HDL Ratio: 4.1 (calc) (ref <5.0)
Triglycerides: 260 mg/dL — ABNORMAL HIGH (ref <150)

## 2020-03-07 LAB — VITAMIN B12: Vitamin B-12: 434 pg/mL (ref 200–1100)

## 2020-03-07 LAB — PSA: PSA: 0.5 ng/mL (ref <=4.0)

## 2020-03-12 ENCOUNTER — Other Ambulatory Visit: Payer: Self-pay

## 2020-03-12 MED ORDER — AMLODIPINE BESY-BENAZEPRIL HCL 5-40 MG PO CAPS
1.0000 | ORAL_CAPSULE | Freq: Every day | ORAL | 3 refills | Status: DC
Start: 2020-03-12 — End: 2020-12-26

## 2020-03-12 MED ORDER — ARIPIPRAZOLE 5 MG PO TABS
5.0000 mg | ORAL_TABLET | Freq: Every day | ORAL | 11 refills | Status: DC
Start: 2020-03-12 — End: 2020-11-27

## 2020-03-12 MED ORDER — ROSUVASTATIN CALCIUM 10 MG PO TABS
10.0000 mg | ORAL_TABLET | Freq: Every day | ORAL | 3 refills | Status: DC
Start: 2020-03-12 — End: 2020-12-26

## 2020-03-12 MED ORDER — EMPAGLIFLOZIN 10 MG PO TABS: ORAL_TABLET | ORAL | 3 refills | Status: DC

## 2020-03-13 ENCOUNTER — Telehealth: Payer: Self-pay

## 2020-03-13 NOTE — Telephone Encounter (Signed)
PA has been initiated on covermymeds for Jardiance 10 mg  Key: [id number] PA Case ID: [id number] Rx #: [id number]   I will update when a decision has been made.

## 2020-05-21 DIAGNOSIS — D225 Melanocytic nevi of trunk: Secondary | ICD-10-CM | POA: Diagnosis not present

## 2020-05-21 DIAGNOSIS — C44619 Basal cell carcinoma of skin of left upper limb, including shoulder: Secondary | ICD-10-CM | POA: Diagnosis not present

## 2020-05-21 DIAGNOSIS — D485 Neoplasm of uncertain behavior of skin: Secondary | ICD-10-CM | POA: Diagnosis not present

## 2020-05-21 DIAGNOSIS — D2371 Other benign neoplasm of skin of right lower limb, including hip: Secondary | ICD-10-CM | POA: Diagnosis not present

## 2020-05-21 DIAGNOSIS — L309 Dermatitis, unspecified: Secondary | ICD-10-CM | POA: Diagnosis not present

## 2020-05-21 DIAGNOSIS — L570 Actinic keratosis: Secondary | ICD-10-CM | POA: Diagnosis not present

## 2020-05-21 DIAGNOSIS — Z85828 Personal history of other malignant neoplasm of skin: Secondary | ICD-10-CM | POA: Diagnosis not present

## 2020-05-21 DIAGNOSIS — L821 Other seborrheic keratosis: Secondary | ICD-10-CM | POA: Diagnosis not present

## 2020-07-08 ENCOUNTER — Ambulatory Visit: Payer: BC Managed Care – PPO | Admitting: Family Medicine

## 2020-07-09 NOTE — Patient Instructions (Addendum)
Health Maintenance Due  Topic Date Due  . OPHTHALMOLOGY EXAM Needs to be scheduled. 05/06/2019  . COVID-19 Vaccine (3 - Booster for [employer] series) Needs to be scheduled. 06/27/2020   For diabetes-update A1c with labs today.  Continue insulin 70/30 43 units in the morning and 31 units in the evening for now, Janumet. We will add in  Jardiance 10mg  and hold off on changing insulin for now. I want him to update me in 1 month by mychart and may increase insulin at that point. I would like a week's worth of #s- with morning sugar, then at least one other reading per day of 2 hours after breakfast,  before lunch, 2 hours after lunch, before dinner, 2 hours after dinner , before bed   Please stop by lab before you go If you have mychart- we will send your results within 3 business days of [place] receiving them.  If you do not have mychart- we will call you about results within 5 business days of [place] receiving them.  *please also note that you will see labs on mychart as soon as they post. I will later go in and write notes on them- will say "notes from Dr. Yong Channel"  We will try to mail information about vibryyd for patient assistance to you   Recommended follow up: Return in about 4 months (around 11/14/2020) for follow up- or sooner if needed.

## 2020-07-09 NOTE — Progress Notes (Signed)
Phone [phone] In person visit   Subjective:   [name] is a 60 y.o. year old very pleasant male patient who presents for/with See problem oriented charting Chief Complaint  Patient presents with  . Hypertension  . Hyperlipidemia  . Diabetes  . Depression  . Gastroesophageal Reflux   This visit occurred during the SARS-CoV-2 public health emergency.  Safety protocols were in place, including screening questions prior to the visit, additional usage of staff PPE, and extensive cleaning of exam room while observing appropriate contact time as indicated for disinfecting solutions.   Past Medical History-  Patient Active Problem List   Diagnosis Date Noted  . Morbid obesity ([place]) 12/13/2008    Priority: High  . Type 2 diabetes mellitus ([place]) 03/05/2008    Priority: High  . History of melanoma. R ear.  12/16/2006    Priority: High  . Hypertension associated with diabetes ([place]) 03/04/2015    Priority: Medium  . GERD (gastroesophageal reflux disease) 03/04/2015    Priority: Medium  . Major depression, recurrent, full remission ([place]) 12/16/2006    Priority: Medium  . Hyperlipidemia associated with type 2 diabetes mellitus ([place]) 11/07/2006    Priority: Medium  . Eczema 12/02/2015    Priority: Low    Medications- reviewed and updated Current Outpatient Medications  Medication Sig Dispense Refill  . amLODipine-benazepril (LOTREL) 5-40 MG capsule Take 1 capsule by mouth daily. 90 capsule 3  . ARIPiprazole (ABILIFY) 5 MG tablet Take 1 tablet (5 mg total) by mouth daily. 30 tablet 11  . Blood Glucose Monitoring Suppl (ONETOUCH VERIO FLEX SYSTEM) w/Device KIT 1 each by Does not apply route 3 (three) times daily. 1 kit 0  . glucose blood (ONETOUCH VERIO) test strip USE TO CHECK BLOOD SUGAR THREE TIMES A DAY AND PRN 100 each 5  . ondansetron (ZOFRAN) 8 MG tablet TAKE 1 TABLET(8 MG) BY MOUTH EVERY 8 HOURS AS NEEDED FOR NAUSEA OR VOMITING 30 tablet 5  . ONETOUCH DELICA LANCETS  46K MISC USE TO CHECK BLOOD SUGAR THREE TIMES A DAY AND PRN 100 each 5  . rosuvastatin (CRESTOR) 10 MG tablet Take 1 tablet (10 mg total) by mouth daily. 90 tablet 3  . empagliflozin (JARDIANCE) 10 MG TABS tablet Take One 10 mg Tablet Daily. 90 tablet 3  . insulin NPH-regular Human (NOVOLIN 70/30) (70-30) 100 UNIT/ML injection Inject 31 Units into the skin 2 (two) times daily with a meal. INJECT 30-50 UNITS SUBCUTANEOUSLY BEFORE BREAKFAST AND DINNER  (IF YOU SKIP A MEAL DO NOT USE THE INSULIN) 60 mL 3  . pantoprazole (PROTONIX) 20 MG tablet Take 1 tablet (20 mg total) by mouth daily. (Patient not taking: Reported on 07/15/2020) 90 tablet 3  . SitaGLIPtin-MetFORMIN HCl (JANUMET XR) 50-1000 MG TB24 Take 1 tablet by mouth 2 (two) times daily. 180 tablet 3  . Vilazodone HCl (VIIBRYD) 10 MG TABS Take 10 mg by mouth daily. (Patient not taking: Reported on 07/15/2020)     No current facility-administered medications for this visit.     Objective:  BP 138/82   Pulse 80   Temp 97.8 F (36.6 C) (Temporal)   Ht 5' 9" (1.753 m)   Wt 288 lb 12.8 oz (131 kg)   SpO2 94%   BMI 42.65 kg/m  Gen: NAD, resting comfortably CV: RRR no murmurs rubs or gallops Lungs: CTAB no crackles, wheeze, rhonchi Abdomen: soft/nontender/nondistended/normal bowel sounds. No rebound or guarding.  Ext: trace edema Skin: warm, dry    Assessment  and Plan   #Diabetes/morbid obesity S:Patient using 70/30 insulin typically 43 units in the morning and 31 units in the evening.  Also on janumet (just ran out a few days ago) and we also added Jardiance 10 mg last visit (but he has not had a chance to fill the rx yet)  -On liraglutide in the past had diarrhea and vomiting -Weight up 2 pounds from last visit-TSH normal within 6 months - 150s or 160s in the morning lately. No lows  Has improved eating recently- exercise has fallen off- doesn't like it Lab Results  Component Value Date   HGBA1C 10.7 (H) 03/06/2020   HGBA1C 12.0  (A) 10/31/2019   HGBA1C 7.3 (A) 07/02/2019   A/P:  For diabetes-update A1c with labs today.  Continue insulin 70/30 43 units in the morning and 31 units in the evening for now, Janumet. We will add in  Jardiance 69m and hold off on changing insulin for now. I want him to update me in 1 month by mychart and may increase insulin at that point. I would like a week's worth of #s- with morning sugar, then at least one other reading per day of 2 hours after breakfast,  before lunch, 2 hours after lunch, before dinner, 2 hours after dinner , before bed - discussed importance of hydration on jardiance again  For morbid obesity- Encouraged need for healthy eating, regular exercise, weight loss.     #Hyperlipidemia S: Compliant with rosuvastatin 10 mg Lab Results  Component Value Date   CHOL 150 03/06/2020   HDL 37 (L) 03/06/2020   LDLCALC 79 03/06/2020   LDLDIRECT 58.0 08/04/2017   TRIG 260 (H) 03/06/2020   CHOLHDL 4.1 03/06/2020   A/P: Reasonable though not ideal control- I think triglycerides will come down with lifestyle changes and improving A1c  #Hypertension S:Compliant with amlodipine benazepril 5-472m  BP Readings from Last 3 Encounters:  07/15/20 138/82  03/06/20 138/86  10/31/19 (!) 148/80   A/P: High normal-we will monitor for now and work on lifestyle-if increases further would likely tweak medicines.  #GERD S: Compliant with Protonix 20 mg in past- has transitioned to omeprazole 20 mg.  I recommended Pepcid as needed for backup -B12 has been low normal so I recommended at least 250 mcg B12 daily  A/P: doing well with OTC medicine- continue to monitor. Did not do well with an alternate brand (may have been pepcid)     % #Depression-follows with psychiatry Dr. KaToy Care: on Abilify 33m48mvibryyd has been too costly.  Lifelong issues with suicidal ideation but without plan or progressive thoughts-psychiatry is in the loop. He had to come off of pristiq 67m58mrintellix due to cost,  now off vibryyd as well Depression screen PHQ [facility] 07/15/2020 03/06/2020 10/31/2019  Decreased Interest _0 Down, Depressed, Hopeless 1 0 3  PHQ - 2 Score _1 Altered sleeping 0 0 1  Tired, decreased energy 1 0 0  Change in appetite 1 0 0  Feeling bad or failure about yourself  _2 Trouble concentrating 1 0 0  Moving slowly or fidgety/restless 0 0 0  Suicidal thoughts 1 0 1  PHQ-9 Score _3 Difficult doing work/chores Somewhat difficult Not difficult at all Not difficult at all  A/P: depression in partial remisison- not doing as well off vibryyd but cannot affod   #Mild LFT elevation- suspect fatty liver- we will work on lifestyle and update LFTs  today, elevations not significant enough for me to stop statin at this time Lab Results  Component Value Date   ALT 40 03/06/2020   AST 41 (H) 03/06/2020   ALKPHOS 42 01/01/2019   BILITOT 0.3 03/06/2020    Recommended follow up: Return in about 4 months (around 11/14/2020) for follow up- or sooner if needed.   Lab/Order associations:   ICD-10-CM   1. Hypertension associated with diabetes ([place])  E11.59    I15.2   2. Type 2 diabetes mellitus without complication, with long-term current use of insulin (HCC)  E11.9 Comprehensive metabolic panel   E45.4 Hemoglobin A1c  3. Hyperlipidemia associated with type 2 diabetes mellitus ([place])  E11.69    E78.5   4. Major depression, recurrent, full remission ([place])  F33.42   5. Morbid obesity ([place]) Chronic E66.01     Meds ordered this encounter  Medications  . insulin NPH-regular Human (NOVOLIN 70/30) (70-30) 100 UNIT/ML injection    Sig: Inject 31 Units into the skin 2 (two) times daily with a meal. INJECT 30-50 UNITS SUBCUTANEOUSLY BEFORE BREAKFAST AND DINNER  (IF YOU SKIP A MEAL DO NOT USE THE INSULIN)    Dispense:  60 mL    Refill:  3  . SitaGLIPtin-MetFORMIN HCl (JANUMET XR) 50-1000 MG TB24    Sig: Take 1 tablet by mouth 2 (two) times daily.    Dispense:  180 tablet    Refill:  3   . empagliflozin (JARDIANCE) 10 MG TABS tablet    Sig: Take One 10 mg Tablet Daily.    Dispense:  90 tablet    Refill:  3    Return precautions advised.  Garret Reddish, MD

## 2020-07-15 ENCOUNTER — Other Ambulatory Visit: Payer: Self-pay

## 2020-07-15 ENCOUNTER — Encounter: Payer: Self-pay | Admitting: Family Medicine

## 2020-07-15 ENCOUNTER — Ambulatory Visit (INDEPENDENT_AMBULATORY_CARE_PROVIDER_SITE_OTHER): Payer: BC Managed Care – PPO | Admitting: Family Medicine

## 2020-07-15 VITALS — BP 138/82 | HR 80 | Temp 97.8°F | Ht 69.0 in | Wt 288.8 lb

## 2020-07-15 DIAGNOSIS — E1159 Type 2 diabetes mellitus with other circulatory complications: Secondary | ICD-10-CM

## 2020-07-15 DIAGNOSIS — E785 Hyperlipidemia, unspecified: Secondary | ICD-10-CM

## 2020-07-15 DIAGNOSIS — F3342 Major depressive disorder, recurrent, in full remission: Secondary | ICD-10-CM | POA: Diagnosis not present

## 2020-07-15 DIAGNOSIS — E1169 Type 2 diabetes mellitus with other specified complication: Secondary | ICD-10-CM | POA: Diagnosis not present

## 2020-07-15 DIAGNOSIS — I152 Hypertension secondary to endocrine disorders: Secondary | ICD-10-CM

## 2020-07-15 DIAGNOSIS — Z794 Long term (current) use of insulin: Secondary | ICD-10-CM

## 2020-07-15 DIAGNOSIS — E119 Type 2 diabetes mellitus without complications: Secondary | ICD-10-CM

## 2020-07-15 LAB — COMPREHENSIVE METABOLIC PANEL
ALT: 31 U/L (ref 0–53)
AST: 30 U/L (ref 0–37)
Albumin: 3.9 g/dL (ref 3.5–5.2)
Alkaline Phosphatase: 54 U/L (ref 39–117)
BUN: 13 mg/dL (ref 6–23)
CO2: 26 mEq/L (ref 19–32)
Calcium: 9.3 mg/dL (ref 8.4–10.5)
Chloride: 99 mEq/L (ref 96–112)
Creatinine, Ser: 1.03 mg/dL (ref 0.40–1.50)
GFR: 78.89 mL/min (ref >60.00)
Glucose, Bld: 268 mg/dL — ABNORMAL HIGH (ref 70–99)
Potassium: 4 mEq/L (ref 3.5–5.1)
Sodium: 135 mEq/L (ref 135–145)
Total Bilirubin: 0.4 mg/dL (ref 0.2–1.2)
Total Protein: 6.8 g/dL (ref 6.0–8.3)

## 2020-07-15 LAB — HEMOGLOBIN A1C: Hgb A1c MFr Bld: 10.8 % — ABNORMAL HIGH (ref 4.6–6.5)

## 2020-07-15 MED ORDER — FREESTYLE LIBRE 14 DAY SENSOR MISC: 2.0000 | Freq: Every day | 11 refills | Status: DC

## 2020-07-15 MED ORDER — JANUMET XR 50-1000 MG PO TB24: 1.0000 | ORAL_TABLET | Freq: Two times a day (BID) | ORAL | 3 refills | Status: DC

## 2020-07-15 MED ORDER — FREESTYLE LIBRE 14 DAY READER DEVI: 2.0000 | Freq: Every day | 11 refills | Status: DC

## 2020-07-15 MED ORDER — NOVOLIN 70/30 (70-30) 100 UNIT/ML ~~LOC~~ SUSP: 31.0000 [IU] | Freq: Two times a day (BID) | SUBCUTANEOUS | 3 refills | Status: DC

## 2020-07-15 MED ORDER — EMPAGLIFLOZIN 10 MG PO TABS: ORAL_TABLET | ORAL | 3 refills | Status: DC

## 2020-07-16 ENCOUNTER — Encounter: Payer: Self-pay | Admitting: Family Medicine

## 2020-07-17 DIAGNOSIS — M25522 Pain in left elbow: Secondary | ICD-10-CM | POA: Diagnosis not present

## 2020-09-05 ENCOUNTER — Other Ambulatory Visit: Payer: Self-pay

## 2020-09-05 ENCOUNTER — Telehealth: Payer: Self-pay

## 2020-09-05 NOTE — Telephone Encounter (Signed)
Patient is requesting a new [place] [phone]   [facility]  ([place], [place], [address]

## 2020-09-05 NOTE — Telephone Encounter (Signed)
Pt responded via my chart

## 2020-09-05 NOTE — Telephone Encounter (Signed)
Called and lm for pt to call back, I sent a mychart as well. There is nothing coming up for a medication by that name, request patient contact insurance for proper name of that medication OR a list of meds that they will cover.

## 2020-09-26 ENCOUNTER — Telehealth: Payer: Self-pay

## 2020-09-26 NOTE — Telephone Encounter (Signed)
  LAST APPOINTMENT DATE: 07/15/2020   NEXT APPOINTMENT DATE:@8 /25/2022  MEDICATION:insulin NPH-regular Human (NOVOLIN 70/30) (70-30) 100 UNIT/ML injection  PHARMACY: [facility]  ([place], [place]   Please advise

## 2020-09-30 MED ORDER — NOVOLIN 70/30 (70-30) 100 UNIT/ML ~~LOC~~ SUSP: 31.0000 [IU] | Freq: Two times a day (BID) | SUBCUTANEOUS | 3 refills | Status: DC

## 2020-09-30 NOTE — Telephone Encounter (Signed)
Left detailed message on personal voicemail Rx sent to mail order as requested. Any questions please call office.

## 2020-10-14 ENCOUNTER — Other Ambulatory Visit: Payer: Self-pay

## 2020-10-14 ENCOUNTER — Encounter: Payer: Self-pay | Admitting: Family Medicine

## 2020-10-14 MED ORDER — HUMULIN 70/30 KWIKPEN (70-30) 100 UNIT/ML ~~LOC~~ SUPN: PEN_INJECTOR | SUBCUTANEOUS | 11 refills | Status: DC

## 2020-11-20 ENCOUNTER — Encounter: Payer: Self-pay | Admitting: Family Medicine

## 2020-11-20 ENCOUNTER — Telehealth (INDEPENDENT_AMBULATORY_CARE_PROVIDER_SITE_OTHER): Payer: BC Managed Care – PPO | Admitting: Family Medicine

## 2020-11-20 DIAGNOSIS — Z125 Encounter for screening for malignant neoplasm of prostate: Secondary | ICD-10-CM | POA: Diagnosis not present

## 2020-11-20 DIAGNOSIS — E1159 Type 2 diabetes mellitus with other circulatory complications: Secondary | ICD-10-CM | POA: Diagnosis not present

## 2020-11-20 DIAGNOSIS — E119 Type 2 diabetes mellitus without complications: Secondary | ICD-10-CM

## 2020-11-20 DIAGNOSIS — F3342 Major depressive disorder, recurrent, in full remission: Secondary | ICD-10-CM

## 2020-11-20 DIAGNOSIS — E1169 Type 2 diabetes mellitus with other specified complication: Secondary | ICD-10-CM | POA: Diagnosis not present

## 2020-11-20 DIAGNOSIS — I152 Hypertension secondary to endocrine disorders: Secondary | ICD-10-CM

## 2020-11-20 DIAGNOSIS — Z794 Long term (current) use of insulin: Secondary | ICD-10-CM

## 2020-11-20 DIAGNOSIS — E785 Hyperlipidemia, unspecified: Secondary | ICD-10-CM

## 2020-11-20 NOTE — Patient Instructions (Addendum)
Health Maintenance Due  Topic Date Due   Pneumococcal Vaccine 78-61 Years old (2 - PCV) Please discuss at next in office visit.  03/03/2016   OPHTHALMOLOGY EXAM Will get this scheduled.  05/06/2019   COVID-19 Vaccine (3 - Pfizer risk series) Will get this scheduled at his earliest convenience.  01/25/2020   INFLUENZA VACCINE Not available in office YET. Patient will get this done in the office when he can.  10/27/2020    Recommended follow up: No follow-ups on file.

## 2020-11-20 NOTE — Progress Notes (Signed)
Phone [phone] Virtual visit via Video note   Subjective:  Chief complaint: Chief Complaint  Patient presents with   Hypertension   Diabetes    This visit type was conducted due to national recommendations for restrictions regarding the COVID-19 Pandemic (e.g. social distancing).  This format is felt to be most appropriate for this patient at this time balancing risks to patient and risks to population by having him in for in person visit.  No physical exam was performed (except for noted visual exam or audio findings with Telehealth visits).    Our team/I connected with [name] at  9:40 AM EDT by a video enabled telemedicine application ([email or web address] or caregility through epic) and verified that I am speaking with the correct person using two identifiers.  Location patient: [place] Location provider: [facility], office Persons participating in the virtual visit:  patient  Our team/I discussed the limitations of evaluation and management by telemedicine and the availability of in person appointments. In light of current covid-19 pandemic, patient also understands that we are trying to protect them by minimizing in office contact if at all possible.  The patient expressed consent for telemedicine visit and agreed to proceed. Patient understands insurance will be billed.   Past Medical History-  Patient Active Problem List   Diagnosis Date Noted   Morbid obesity ([place]) 12/13/2008    Priority: High   Type 2 diabetes mellitus ([place]) 03/05/2008    Priority: High   History of melanoma. R ear.  12/16/2006    Priority: High   Hypertension associated with diabetes ([place]) 03/04/2015    Priority: Medium   GERD (gastroesophageal reflux disease) 03/04/2015    Priority: Medium   Major depression, recurrent, full remission ([place]) 12/16/2006    Priority: Medium   Hyperlipidemia associated with type 2 diabetes mellitus ([place]) 11/07/2006    Priority: Medium   Eczema 12/02/2015    Priority:  Low    Medications- reviewed and updated Current Outpatient Medications  Medication Sig Dispense Refill   amLODipine-benazepril (LOTREL) 5-40 MG capsule Take 1 capsule by mouth daily. 90 capsule 3   ARIPiprazole (ABILIFY) 5 MG tablet Take 1 tablet (5 mg total) by mouth daily. 30 tablet 11   Blood Glucose Monitoring Suppl ([place]) w/Device KIT 1 each by Does not apply route 3 (three) times daily. 1 kit 0   Continuous Blood Gluc Receiver (FREESTYLE LIBRE 14 DAY READER) DEVI 2 each by Does not apply route daily. 2 each 11   Continuous Blood Gluc Sensor (FREESTYLE LIBRE 14 DAY SENSOR) MISC 2 each by Does not apply route daily. 2 each 11   empagliflozin (JARDIANCE) 10 MG TABS tablet Take One 10 mg Tablet Daily. 90 tablet 3   glucose blood (ONETOUCH VERIO) test strip USE TO CHECK BLOOD SUGAR THREE TIMES A DAY AND PRN 100 each 5   insulin isophane & regular human KwikPen (HUMULIN 70/30 KWIKPEN) (70-30) 100 UNIT/ML KwikPen Inject 31 Units into the skin 2 (two) times daily with a meal. INJECT 30-50 UNITS SUBCUTANEOUSLY BEFORE BREAKFAST AND DINNER 15 mL 11   ondansetron (ZOFRAN) 8 MG tablet TAKE 1 TABLET(8 MG) BY MOUTH EVERY 8 HOURS AS NEEDED FOR NAUSEA OR VOMITING 30 tablet 5   ONETOUCH DELICA LANCETS 00T MISC USE TO CHECK BLOOD SUGAR THREE TIMES A DAY AND PRN 100 each 5   rosuvastatin (CRESTOR) 10 MG tablet Take 1 tablet (10 mg total) by mouth daily. 90 tablet 3   No current facility-administered  medications for this visit.     Objective:  No self reported vitals Gen: NAD, resting comfortably Lungs: nonlabored, normal respiratory rate  Skin: appears dry, no obvious rash     Assessment and Plan   # sick recently- started day before yesterday. Antihistamines helping.  Symptoms include cough, congestion, runny nose, mild dizziness. Working to stay well hydrated. Recommended rapid test at home and also get set up for PCR if negative . No sob or fever. Does not have pulse ox.   - molnupiravir would be the choice for him due to 3 potential interactions on paxlovid  #Diabetes/morbid obesity S:Patient using 70/30 insulin typically 43 units AM, 31 units PM  (had a period where he was completely out- restarted consistently for 2-3 weeks) -off janumet- he thought was to stop this when starting jardiance -adding jardiance 07/15/20 - liraglutide/victoza was expensive  Lab Results  Component Value Date   HGBA1C 10.8 (H) 07/15/2020   HGBA1C 10.7 (H) 03/06/2020   HGBA1C 12.0 (A) 10/31/2019  -scale broke today A/P:  hopefully improved- update a1c in 6 weeks (that would be at  least 2 months on Humalin, janumet, jardiance gother)  today. Continue current meds for now.   #Hyperlipidemia S: Compliant with rosuvastatin 10 mg Lab Results  Component Value Date   CHOL 150 03/06/2020   HDL 37 (L) 03/06/2020   LDLCALC 79 03/06/2020   LDLDIRECT 58.0 08/04/2017   TRIG 260 (H) 03/06/2020   CHOLHDL 4.1 03/06/2020  A/P: next visit will be due for full lipid panel- for now continue current meds- close to ideal control last visit   #Hypertension S:Compliant with amlodipine benazepril 5-40mg   -not checking recently BP Readings from Last 3 Encounters:  07/15/20 138/82  03/06/20 138/86  10/31/19 (!) 148/80  A/P: presume stable- he is going to try to get home cuff to monitor and we can recheck next visit- for now continue current meds   #Depression-follows with psychiatry Dr. Toy Care yearly S: Remains in full remission on Abilify 5mg . No suicidal ideation. Trying to get a 2nd med but pending approval for financial assistance- Dewaine Conger - He had to come off of pristiq 50mg , trintellix, vibryyd due to cost Depression screen [facility] 2/9 11/20/2020 07/15/2020 03/06/2020  Decreased Interest 1 1 1   Down, Depressed, Hopeless 2 1 0  PHQ - 2 Score 3 2 1   Altered sleeping 0 0 0  Tired, decreased energy 0 1 0  Change in appetite 0 1 0  Feeling bad or failure about yourself  1 2 1   Trouble  concentrating 0 1 0  Moving slowly or fidgety/restless 0 0 0  Suicidal thoughts 1 1 0  PHQ-9 Score 5 8 2   Difficult doing work/chores Somewhat difficult Somewhat difficult Not difficult at all  A/P: mild poor control but pending new rx- contineu to follow up with pharmacy and psychiatry  # Mild LFt elevations- likely fatty liver- monitor LFTs with next labs  Recommended follow up: call to schedule 6-8 week lab visit . 6 month physical  Lab/Order associations:   ICD-10-CM   1. Type 2 diabetes mellitus without complication, with long-term current use of insulin (HCC)  E11.9 CBC with Differential/Platelet   Z79.4 Comprehensive metabolic panel    Lipid panel    Hemoglobin A1c    2. Hyperlipidemia associated with type 2 diabetes mellitus (HCC)  E11.69 CBC with Differential/Platelet   E78.5 Comprehensive metabolic panel    Lipid panel    Hemoglobin A1c    3. Hypertension  associated with diabetes ([place])  E11.59    I15.2     4. Major depression, recurrent, full remission ([place])  F33.42     5. Screening for prostate cancer  Z12.5 PSA      No orders of the defined types were placed in this encounter. Return precautions advised.  Garret Reddish, MD

## 2020-11-26 ENCOUNTER — Other Ambulatory Visit: Payer: Self-pay | Admitting: Family Medicine

## 2020-12-18 ENCOUNTER — Emergency Department (HOSPITAL_COMMUNITY): Payer: BC Managed Care – PPO

## 2020-12-18 ENCOUNTER — Emergency Department (HOSPITAL_COMMUNITY)
Admission: EM | Admit: 2020-12-18 | Discharge: 2020-12-18 | Disposition: A | Discharge status: Home / Self Care | Payer: BC Managed Care – PPO | Attending: Emergency Medicine | Admitting: Emergency Medicine

## 2020-12-18 ENCOUNTER — Encounter (HOSPITAL_COMMUNITY): Payer: Self-pay | Admitting: Emergency Medicine

## 2020-12-18 ENCOUNTER — Telehealth: Payer: Self-pay

## 2020-12-18 DIAGNOSIS — W109XXA Fall (on) (from) unspecified stairs and steps, initial encounter: Secondary | ICD-10-CM | POA: Diagnosis not present

## 2020-12-18 DIAGNOSIS — W19XXXA Unspecified fall, initial encounter: Secondary | ICD-10-CM

## 2020-12-18 DIAGNOSIS — I10 Essential (primary) hypertension: Secondary | ICD-10-CM | POA: Diagnosis not present

## 2020-12-18 DIAGNOSIS — E119 Type 2 diabetes mellitus without complications: Secondary | ICD-10-CM | POA: Insufficient documentation

## 2020-12-18 DIAGNOSIS — S42212A Unspecified displaced fracture of surgical neck of left humerus, initial encounter for closed fracture: Secondary | ICD-10-CM | POA: Diagnosis not present

## 2020-12-18 DIAGNOSIS — Z79899 Other long term (current) drug therapy: Secondary | ICD-10-CM | POA: Insufficient documentation

## 2020-12-18 DIAGNOSIS — Z87891 Personal history of nicotine dependence: Secondary | ICD-10-CM | POA: Insufficient documentation

## 2020-12-18 DIAGNOSIS — S42215A Unspecified nondisplaced fracture of surgical neck of left humerus, initial encounter for closed fracture: Secondary | ICD-10-CM | POA: Diagnosis not present

## 2020-12-18 DIAGNOSIS — Z85828 Personal history of other malignant neoplasm of skin: Secondary | ICD-10-CM | POA: Insufficient documentation

## 2020-12-18 DIAGNOSIS — S6992XA Unspecified injury of left wrist, hand and finger(s), initial encounter: Secondary | ICD-10-CM | POA: Diagnosis not present

## 2020-12-18 DIAGNOSIS — Z7984 Long term (current) use of oral hypoglycemic drugs: Secondary | ICD-10-CM | POA: Insufficient documentation

## 2020-12-18 DIAGNOSIS — Z794 Long term (current) use of insulin: Secondary | ICD-10-CM | POA: Insufficient documentation

## 2020-12-18 MED ORDER — ONDANSETRON 8 MG PO TBDP: 8.0000 mg | ORAL_TABLET | Freq: Three times a day (TID) | ORAL | 0 refills | Status: DC | PRN

## 2020-12-18 MED ORDER — ACETAMINOPHEN 325 MG PO TABS
650.0000 mg | ORAL_TABLET | Freq: Once | ORAL | Status: DC
Start: 2020-12-18 — End: 2020-12-18

## 2020-12-18 MED ORDER — OXYCODONE-ACETAMINOPHEN 5-325 MG PO TABS: 1.0000 | ORAL_TABLET | Freq: Four times a day (QID) | ORAL | 0 refills | Status: DC | PRN

## 2020-12-18 NOTE — ED Triage Notes (Signed)
Per pt, states he fell down 13 stairs last night at his condo-complaining of left arm and shoulder pain-sno signs of a deformity

## 2020-12-18 NOTE — Telephone Encounter (Signed)
Nurse Assessment Nurse: Raphael Gibney, RN, Vanita Ingles Date/Time (Eastern Time): 12/18/2020 9:07:35 AM Confirm and document reason for call. If symptomatic, describe symptoms. ---Caller states he fell last night and may have broken left arm. Does the patient have any new or worsening symptoms? ---Yes Will a triage be completed? ---Yes Related visit to physician within the last 2 weeks? ---No Does the PT have any chronic conditions? (i.e. diabetes, asthma, this includes High risk factors for pregnancy, etc.) ---Yes List chronic conditions. ---diabetes Is this a behavioral health or substance abuse call? ---No Guidelines Guideline Title Affirmed Question Affirmed Notes Nurse Date/Time (Eastern Time) Arm Injury Looks like a broken bone (crooked or deformed) Raphael Gibney, RN, Vanita Ingles 12/18/2020 9:08:48 AM Disp. Time Eilene Ghazi Time) Disposition Final User 12/18/2020 9:11:39 AM Go to ED Now Yes Raphael Gibney, RN, Vanita Ingles PLEASE NOTE: All timestamps contained within this report are represented as [place] Standard Time. CONFIDENTIALTY NOTICE: This fax transmission is intended only for the addressee. It contains information that is legally privileged, confidential or otherwise protected from use or disclosure. If you are not the intended recipient, you are strictly prohibited from reviewing, disclosing, copying using or disseminating any of this information or taking any action in reliance on or regarding this information. If you have received this fax in error, please notify us immediately by telephone so that we can arrange for its return to [place]. Phone: [phone], Toll-Free: [phone], Fax: [phone] Page: 2 of 2 Call Id: [id number] [place] Disagree/Comply Comply Caller Understands Yes PreDisposition Call Doctor Care Advice Given Per Guideline GO TO ED NOW: * You need to be seen in the Emergency Department. ANOTHER ADULT SHOULD DRIVE: * It is better and safer if another adult drives instead of you. FIRST AID -  SPLINT: * Splint the fracture (broken bone) before moving the patient. * You can make a splint from: a wooden board, rolled-up newspaper, cardboard, or a pillow. CARE ADVICE given per Arm Injury (Adult) guideline. NOTHING BY MOUTH: * Do not eat or drink anything for now. Referrals [facility] - ED

## 2020-12-18 NOTE — Progress Notes (Signed)
Orthopedic Tech Progress Note Patient Details:  [name] May 28, 1960 [id number]  Ortho Devices Type of Ortho Device: Sling immobilizer Ortho Device/Splint Location: Left Arm Ortho Device/Splint Interventions: Ordered, Application, Adjustment   Post Interventions Patient Tolerated: Well Instructions Provided: Adjustment of device, Care of device, Poper ambulation with device  Rosana Hoes 12/18/2020, 12:48 PM

## 2020-12-18 NOTE — ED Provider Notes (Signed)
[facility] DEPT Provider Note   CSN: [id number] Arrival date & time: 12/18/20  8177     History Chief Complaint  Patient presents with   [name] is a 60 y.o. male.  HPI 60 year old male presents today after fall down multiple stairs yesterday.  He states he had several drinks prior to this.  He fell down several stairs does not think he struck his head or had any other injury.  He is complaining of pain in his left shoulder.  He denies numbness tingling or weakness.  He states that he drinks 2-3 times per week and has couple drinks each time.  He is not on any blood thinners denies any head injury, neck pain, back pain, or lateralized weakness.    Past Medical History:  Diagnosis Date   Cancer ([place])    melanoma;  right ear   Depression    Diabetes mellitus    GERD (gastroesophageal reflux disease)    Hyperlipidemia    Hypertension    Metabolic syndrome    Obesity     Patient Active Problem List   Diagnosis Date Noted   Eczema 12/02/2015   Hypertension associated with diabetes ([place]) 03/04/2015   GERD (gastroesophageal reflux disease) 03/04/2015   Morbid obesity ([place]) 12/13/2008   Type 2 diabetes mellitus ([place]) 03/05/2008   History of melanoma. R ear.  12/16/2006   Major depression, recurrent, full remission ([place]) 12/16/2006   Hyperlipidemia associated with type 2 diabetes mellitus ([place]) 11/07/2006    Past Surgical History:  Procedure Laterality Date   COLONOSCOPY  05/01/2013   CYST REMOVAL TRUNK  1989   benign   MELANOMA EXCISION  2008   right ear and neck   POLYPECTOMY     reconstructive plastic surgery face  1967   dog bite at age 56; about 77 surgeries for 10 years   TONSILLECTOMY  1966   age 15       Family History  Problem Relation Age of Onset   Arthritis Mother    Stroke Father 46       heavy smoker 4-5 packs a day unfiltered   Schizophrenia Father        homeless and did not get medical care in time    Alcoholism Father    Colon cancer Neg Hx    Esophageal cancer Neg Hx    Rectal cancer Neg Hx    Stomach cancer Neg Hx    Colon polyps Neg Hx     Social History   Tobacco Use   Smoking status: Former    Packs/day: 0.50    Years: 5.00    Pack years: 2.50    Types: Cigarettes    Quit date: 05/19/1988    Years since quitting: 32.6   Smokeless tobacco: Never   Tobacco comments:    quit in 1990  Vaping Use   Vaping Use: Never used  Substance Use Topics   Alcohol use: Yes    Alcohol/week: 5.0 standard drinks    Types: 3 Cans of beer, 2 Glasses of wine per week   Drug use: No    Home Medications Prior to Admission medications   Medication Sig Start Date End Date Taking? Authorizing Provider  ondansetron (ZOFRAN ODT) 8 MG disintegrating tablet Take 1 tablet (8 mg total) by mouth every 8 (eight) hours as needed for nausea or vomiting. 12/18/20  Yes Pattricia Boss, MD  oxyCODONE-acetaminophen (PERCOCET/ROXICET) 5-325 MG tablet Take 1 tablet  by mouth every 6 (six) hours as needed for severe pain. 12/18/20  Yes Pattricia Boss, MD  amLODipine-benazepril (LOTREL) 5-40 MG capsule Take 1 capsule by mouth daily. 03/12/20   Marin Olp, MD  ARIPiprazole (ABILIFY) 5 MG tablet TAKE 1 TABLET BY MOUTH  DAILY 11/27/20   Marin Olp, MD  Blood Glucose Monitoring Suppl ([place]) w/Device KIT 1 each by Does not apply route 3 (three) times daily. 04/06/16   Marin Olp, MD  Continuous Blood Gluc Receiver (FREESTYLE LIBRE 14 DAY READER) DEVI 2 each by Does not apply route daily. 07/15/20   Marin Olp, MD  Continuous Blood Gluc Sensor (FREESTYLE LIBRE 14 DAY SENSOR) MISC 2 each by Does not apply route daily. 07/15/20   Marin Olp, MD  empagliflozin (JARDIANCE) 10 MG TABS tablet Take One 10 mg Tablet Daily. 07/15/20   Marin Olp, MD  glucose blood (ONETOUCH VERIO) test strip USE TO CHECK BLOOD SUGAR THREE TIMES A DAY AND PRN 04/06/16   Marin Olp, MD   insulin isophane & regular human KwikPen (HUMULIN 70/30 KWIKPEN) (70-30) 100 UNIT/ML KwikPen Inject 31 Units into the skin 2 (two) times daily with a meal. INJECT 30-50 UNITS SUBCUTANEOUSLY BEFORE BREAKFAST AND DINNER 10/14/20   Marin Olp, MD  ondansetron (ZOFRAN) 8 MG tablet TAKE 1 TABLET(8 MG) BY MOUTH EVERY 8 HOURS AS NEEDED FOR NAUSEA OR VOMITING 07/02/19   Marin Olp, MD  [facility] DELICA LANCETS 57I MISC USE TO CHECK BLOOD SUGAR THREE TIMES A DAY AND PRN 04/06/16   Marin Olp, MD  rosuvastatin (CRESTOR) 10 MG tablet Take 1 tablet (10 mg total) by mouth daily. 03/12/20   Marin Olp, MD    Allergies    Codeine and Food  Review of Systems   Review of Systems  All other systems reviewed and are negative.  Physical Exam Updated Vital Signs BP 114/90 (BP Location: Right Arm)   Pulse (!) 112   Temp 98.2 F (36.8 C) (Oral)   Resp 18   Ht 1.727 m ($Remove'5\' 8"'irPMHod$ )   Wt 127 kg   SpO2 96%   BMI 42.57 kg/m   Physical Exam Vitals and nursing note reviewed.  Constitutional:      Appearance: Normal appearance. He is obese.  HENT:     Head: Normocephalic.     Right Ear: External ear normal.     Left Ear: External ear normal.     Nose: Nose normal.     Mouth/Throat:     Pharynx: Oropharynx is clear.  Eyes:     Extraocular Movements: Extraocular movements intact.     Pupils: Pupils are equal, round, and reactive to light.  Cardiovascular:     Rate and Rhythm: Normal rate and regular rhythm.  Pulmonary:     Effort: Pulmonary effort is normal.     Breath sounds: Normal breath sounds.     Comments: No external signs of trauma noted on chest wall No tenderness palpation No crepitance Abdominal:     General: Bowel sounds are normal. There is no distension.     Palpations: Abdomen is soft.     Comments: No external signs of trauma noted on abdominal exam No tenderness to palpation  Musculoskeletal:     Cervical back: Normal range of motion and neck supple.      Comments: Right upper extremity with normal exam Pelvis stable Mild tenderness left buttock No tenderness palpation of her bilateral  hips Bilateral lower extremities without tenderness and normal full active range of motion There is tenderness palpated in the left shoulder and left upper arm There is some bruising noted on the anterior aspect of the upper arm to the elbow There is no tenderness noted over the elbow there is no tenderness noted over the forearm, wrist, or hand Pulses and sensation intact distal to injury  Skin:    General: Skin is warm and dry.     Capillary Refill: Capillary refill takes less than 2 seconds.  Neurological:     General: No focal deficit present.     Mental Status: He is alert.  Psychiatric:        Mood and Affect: Mood normal.        Behavior: Behavior normal.    ED Results / Procedures / Treatments   Labs (all labs ordered are listed, but only abnormal results are displayed) Labs Reviewed - No data to display  EKG None  Radiology DG Shoulder Left  Result Date: 12/18/2020 CLINICAL DATA:  fall EXAM: LEFT SHOULDER - 2 VIEW; LEFT HUMERUS - 2 VIEW COMPARISON:  None. FINDINGS: Left shoulder, left humerus: Severely displaced comminuted fracture of the left humeral neck, with lateral displacement and foreshortening of the shaft. The humeral head also appears inferiorly displaced relative to the glenoid on the scapular Y-view. No other fractures identified. The distal humerus appears normal. IMPRESSION: Severely displaced and comminuted fracture of the left humeral neck as described. Electronically Signed   By: Albin Felling M.D.   On: 12/18/2020 11:52   DG Humerus Left  Result Date: 12/18/2020 CLINICAL DATA:  fall EXAM: LEFT SHOULDER - 2 VIEW; LEFT HUMERUS - 2 VIEW COMPARISON:  None. FINDINGS: Left shoulder, left humerus: Severely displaced comminuted fracture of the left humeral neck, with lateral displacement and foreshortening of the shaft. The humeral  head also appears inferiorly displaced relative to the glenoid on the scapular Y-view. No other fractures identified. The distal humerus appears normal. IMPRESSION: Severely displaced and comminuted fracture of the left humeral neck as described. Electronically Signed   By: Albin Felling M.D.   On: 12/18/2020 11:52    Procedures Procedures   Medications Ordered in ED Medications  acetaminophen (TYLENOL) tablet 650 mg (has no administration in time range)    ED Course  I have reviewed the triage vital signs and the nursing notes.  Pertinent labs & imaging results that were available during my care of the patient were reviewed by me and considered in my medical decision making (see chart for details).    MDM Rules/Calculators/A&P                           Reviewed x-Sukhman Kocher with Hilbert Odor, on-call for orthopedic surgery.  He advises sling and follow-up with Dr. Doreatha Martin. Plan sling, pain medicine, will advise regarding conservative therapy.  Patient advised regarding return precautions and need for follow-up and voices understanding. Final Clinical Impression(s) / ED Diagnoses Final diagnoses:  Fall, initial encounter  Unspecified displaced fracture of surgical neck of left humerus, initial encounter for closed fracture    Rx / DC Orders ED Discharge Orders          Ordered    oxyCODONE-acetaminophen (PERCOCET/ROXICET) 5-325 MG tablet  Every 6 hours PRN        12/18/20 1222    ondansetron (ZOFRAN ODT) 8 MG disintegrating tablet  Every 8 hours PRN  12/18/20 1222             Pattricia Boss, MD 12/18/20 1222

## 2020-12-18 NOTE — ED Notes (Signed)
Pt ambulatory in ED lobby. 

## 2020-12-18 NOTE — Telephone Encounter (Signed)
Pt went to ER

## 2020-12-18 NOTE — Discharge Instructions (Signed)
Your left humerus is broken just below the shoulder with some displacement .  Initial treatment for this is a sling and pain medication. Please keep the area immobilized and use cold therapy in addition to pain medications. Call Dr. Tama Headings office to arrange for outpatient follow-up in the next week Return to the emergency department if you have severe swelling, loss of sensation or strength in your upper extremity from the elbow down.  Do not drink alcohol with your pain medicine.  Please begin to move to acetaminophen and ibuprofen from the narcotic pain medicine as tolerated.

## 2020-12-21 ENCOUNTER — Emergency Department (HOSPITAL_COMMUNITY)
Admission: EM | Admit: 2020-12-21 | Discharge: 2020-12-21 | Disposition: A | Discharge status: Home / Self Care | Payer: BC Managed Care – PPO | Attending: Emergency Medicine | Admitting: Emergency Medicine

## 2020-12-21 ENCOUNTER — Encounter (HOSPITAL_COMMUNITY): Payer: Self-pay | Admitting: Emergency Medicine

## 2020-12-21 DIAGNOSIS — I10 Essential (primary) hypertension: Secondary | ICD-10-CM | POA: Insufficient documentation

## 2020-12-21 DIAGNOSIS — E119 Type 2 diabetes mellitus without complications: Secondary | ICD-10-CM | POA: Diagnosis not present

## 2020-12-21 DIAGNOSIS — S42212A Unspecified displaced fracture of surgical neck of left humerus, initial encounter for closed fracture: Secondary | ICD-10-CM | POA: Diagnosis not present

## 2020-12-21 DIAGNOSIS — S42352A Displaced comminuted fracture of shaft of humerus, left arm, initial encounter for closed fracture: Secondary | ICD-10-CM | POA: Diagnosis not present

## 2020-12-21 DIAGNOSIS — Z87891 Personal history of nicotine dependence: Secondary | ICD-10-CM | POA: Insufficient documentation

## 2020-12-21 DIAGNOSIS — M25512 Pain in left shoulder: Secondary | ICD-10-CM | POA: Diagnosis not present

## 2020-12-21 DIAGNOSIS — Z79899 Other long term (current) drug therapy: Secondary | ICD-10-CM | POA: Insufficient documentation

## 2020-12-21 DIAGNOSIS — Z794 Long term (current) use of insulin: Secondary | ICD-10-CM | POA: Insufficient documentation

## 2020-12-21 DIAGNOSIS — Z85828 Personal history of other malignant neoplasm of skin: Secondary | ICD-10-CM | POA: Diagnosis not present

## 2020-12-21 DIAGNOSIS — W108XXA Fall (on) (from) other stairs and steps, initial encounter: Secondary | ICD-10-CM | POA: Diagnosis not present

## 2020-12-21 DIAGNOSIS — Z7984 Long term (current) use of oral hypoglycemic drugs: Secondary | ICD-10-CM | POA: Insufficient documentation

## 2020-12-21 DIAGNOSIS — S4992XA Unspecified injury of left shoulder and upper arm, initial encounter: Secondary | ICD-10-CM | POA: Diagnosis not present

## 2020-12-21 MED ORDER — OXYCODONE-ACETAMINOPHEN 5-325 MG PO TABS: 2.0000 | ORAL_TABLET | Freq: Four times a day (QID) | ORAL | 0 refills | Status: AC | PRN

## 2020-12-21 NOTE — ED Provider Notes (Addendum)
Emergency Medicine Provider Triage Evaluation Note  [name] , a 60 y.o. male  was evaluated in triage.  Pt complains of worsening pain to left shoulder.  Patient suffered fall on 9/22.  Known humeral fracture.  Patient reports that brace and pain medication are having minimal improvement in his symptoms.  No new falls or injuries.  Patient is right-hand dominant.  Review of Systems  Positive: Arthralgia, myalgia Negative: Numbness, weakness  Physical Exam  BP 119/67 (BP Location: Right Arm)   Pulse (!) 104   Temp 97.9 F (36.6 C) (Oral)   Resp 16   SpO2 98%  Gen:   Awake, no distress   Resp:  Normal effort  MSK:   Ecchymosis noted to left upper chest and left upper arm.  Tenderness to palpation.  Pulse, motor, and sensation intact distally Other:    Medical Decision Making  Medically screening exam initiated at 10:07 AM.  Appropriate orders placed.  [name] was informed that the remainder of the evaluation will be completed by another provider, this initial triage assessment does not replace that evaluation, and the importance of remaining in the ED until their evaluation is complete.  The patient appears stable so that the remainder of the work up may be completed by another provider.      Loni Beckwith, PA-C 12/21/20 1008    Loni Beckwith, PA-C 12/21/20 1009    Charlesetta Shanks, MD 01/14/21 1644

## 2020-12-21 NOTE — ED Provider Notes (Signed)
[facility] DEPT Provider Note   CSN: [id number] Arrival date & time: 12/21/20  4098     History Chief Complaint  Patient presents with   Arm Pain    [name] is a 60 y.o. male presents to the emergency department with a chief plaint of worsening left shoulder pain.  Patient reports that he suffered a fall on 9/22.  Patient's pain has been constant since then.  Patient reports that he went to the emergency department was told he has a broken shoulder.  Patient reports that he was prescribed Percocet and placed in a sling.  Patient has not followed up with orthopedic provider yet.  Patient reports that he took a shower and unable was unable to reapply his sling.  Patient reports that he has had minimal improvement in pain with Percocet medication.  Patient rates pain 8/10 on the pain scale.  Pain is worse with touch or movement.  Patient denies any numbness, weakness, color change, wound.  Patient denies any new falls or injuries.  Patient is right-hand dominant.   Arm Pain      Past Medical History:  Diagnosis Date   Cancer ([place])    melanoma;  right ear   Depression    Diabetes mellitus    GERD (gastroesophageal reflux disease)    Hyperlipidemia    Hypertension    Metabolic syndrome    Obesity     Patient Active Problem List   Diagnosis Date Noted   Eczema 12/02/2015   Hypertension associated with diabetes ([place]) 03/04/2015   GERD (gastroesophageal reflux disease) 03/04/2015   Morbid obesity ([place]) 12/13/2008   Type 2 diabetes mellitus ([place]) 03/05/2008   History of melanoma. R ear.  12/16/2006   Major depression, recurrent, full remission ([place]) 12/16/2006   Hyperlipidemia associated with type 2 diabetes mellitus ([place]) 11/07/2006    Past Surgical History:  Procedure Laterality Date   COLONOSCOPY  05/01/2013   CYST REMOVAL TRUNK  1989   benign   MELANOMA EXCISION  2008   right ear and neck   POLYPECTOMY     reconstructive plastic  surgery face  1967   dog bite at age 89; about 39 surgeries for 10 years   TONSILLECTOMY  1966   age 22       Family History  Problem Relation Age of Onset   Arthritis Mother    Stroke Father 47       heavy smoker 4-5 packs a day unfiltered   Schizophrenia Father        homeless and did not get medical care in time   Alcoholism Father    Colon cancer Neg Hx    Esophageal cancer Neg Hx    Rectal cancer Neg Hx    Stomach cancer Neg Hx    Colon polyps Neg Hx     Social History   Tobacco Use   Smoking status: Former    Packs/day: 0.50    Years: 5.00    Pack years: 2.50    Types: Cigarettes    Quit date: 05/19/1988    Years since quitting: 32.6   Smokeless tobacco: Never   Tobacco comments:    quit in 1990  Vaping Use   Vaping Use: Never used  Substance Use Topics   Alcohol use: Yes    Alcohol/week: 5.0 standard drinks    Types: 3 Cans of beer, 2 Glasses of wine per week   Drug use: No    Home Medications  Prior to Admission medications   Medication Sig Start Date End Date Taking? Authorizing Provider  amLODipine-benazepril (LOTREL) 5-40 MG capsule Take 1 capsule by mouth daily. 03/12/20   Marin Olp, MD  ARIPiprazole (ABILIFY) 5 MG tablet TAKE 1 TABLET BY MOUTH  DAILY 11/27/20   Marin Olp, MD  Blood Glucose Monitoring Suppl ([place]) w/Device KIT 1 each by Does not apply route 3 (three) times daily. 04/06/16   Marin Olp, MD  Continuous Blood Gluc Receiver (FREESTYLE LIBRE 14 DAY READER) DEVI 2 each by Does not apply route daily. 07/15/20   Marin Olp, MD  Continuous Blood Gluc Sensor (FREESTYLE LIBRE 14 DAY SENSOR) MISC 2 each by Does not apply route daily. 07/15/20   Marin Olp, MD  empagliflozin (JARDIANCE) 10 MG TABS tablet Take One 10 mg Tablet Daily. 07/15/20   Marin Olp, MD  glucose blood (ONETOUCH VERIO) test strip USE TO CHECK BLOOD SUGAR THREE TIMES A DAY AND PRN 04/06/16   Marin Olp, MD  insulin  isophane & regular human KwikPen (HUMULIN 70/30 KWIKPEN) (70-30) 100 UNIT/ML KwikPen Inject 31 Units into the skin 2 (two) times daily with a meal. INJECT 30-50 UNITS SUBCUTANEOUSLY BEFORE BREAKFAST AND DINNER 10/14/20   Marin Olp, MD  ondansetron (ZOFRAN ODT) 8 MG disintegrating tablet Take 1 tablet (8 mg total) by mouth every 8 (eight) hours as needed for nausea or vomiting. 12/18/20   Pattricia Boss, MD  ondansetron (ZOFRAN) 8 MG tablet TAKE 1 TABLET(8 MG) BY MOUTH EVERY 8 HOURS AS NEEDED FOR NAUSEA OR VOMITING 07/02/19   Marin Olp, MD  [facility] DELICA LANCETS 62G MISC USE TO CHECK BLOOD SUGAR THREE TIMES A DAY AND PRN 04/06/16   Marin Olp, MD  oxyCODONE-acetaminophen (PERCOCET/ROXICET) 5-325 MG tablet Take 1 tablet by mouth every 6 (six) hours as needed for severe pain. 12/18/20   Pattricia Boss, MD  rosuvastatin (CRESTOR) 10 MG tablet Take 1 tablet (10 mg total) by mouth daily. 03/12/20   Marin Olp, MD    Allergies    Codeine and Food  Review of Systems   Review of Systems  Constitutional:  Negative for chills and fever.  Musculoskeletal:  Positive for arthralgias and myalgias.  Skin:  Negative for color change, pallor, rash and wound.  Neurological:  Negative for weakness and numbness.   Physical Exam Updated Vital Signs BP 119/67 (BP Location: Right Arm)   Pulse (!) 104   Temp 97.9 F (36.6 C) (Oral)   Resp 16   SpO2 98%   Physical Exam Vitals and nursing note reviewed.  Constitutional:      General: He is not in acute distress.    Appearance: He is not ill-appearing, toxic-appearing or diaphoretic.  HENT:     Head: Normocephalic.  Eyes:     General: No scleral icterus.       Right eye: No discharge.        Left eye: No discharge.  Cardiovascular:     Rate and Rhythm: Normal rate.     Pulses:          Radial pulses are 2+ on the right side and 2+ on the left side.  Pulmonary:     Effort: Pulmonary effort is normal.  Musculoskeletal:     Left  shoulder: Tenderness and bony tenderness present. No swelling, deformity, effusion, laceration or crepitus. Decreased range of motion.     Left upper arm: Deformity, tenderness  and bony tenderness present. No swelling, edema or lacerations.     Left elbow: Normal.     Left forearm: Normal.     Left wrist: Normal.     Left hand: No swelling, deformity, lacerations, tenderness or bony tenderness. Normal range of motion. Normal sensation. Normal capillary refill.     Comments: Ecchymosis noted to left upper chest and left upper arm.  Tenderness to left upper arm.   No tenderness or bony tenderness to left hand, left wrist, left forearm, and left elbow.   Skin:    General: Skin is warm and dry.  Neurological:     General: No focal deficit present.     Mental Status: He is alert.  Psychiatric:        Behavior: Behavior is cooperative.    ED Results / Procedures / Treatments   Labs (all labs ordered are listed, but only abnormal results are displayed) Labs Reviewed - No data to display  EKG None  Radiology No results found.  Procedures Procedures   Medications Ordered in ED Medications - No data to display  ED Course  I have reviewed the triage vital signs and the nursing notes.  Pertinent labs & imaging results that were available during my care of the patient were reviewed by me and considered in my medical decision making (see chart for details).    MDM Rules/Calculators/A&P                           Alert 60 year old male no acute distress, nontoxic-appearing.  Presents to ED with chief complaint of continued left shoulder and upper arm pain.  Per chart review patient was seen on 9/22 for a fall.  X-ray imaging showed Severely displaced and comminuted fracture of the left humeral neck.  Orthopedic consult was placed, patient was placed in sling and told to follow-up with Dr. Doreatha Martin in outpatient setting.  Patient has not been able to follow-up with Ortho team in  outpatient setting.  Patient is currently not wearing sling because he removed it for a shower and has not been able to reapply it.  Patient reports minimal improvement in pain with 1 Percocet every 6 hours.  Pulse, motor, and sensation intact distally in left upper extremity.  Will place patient in shoulder sling.  We will increase patient's pain medication to 2 Percocets every 6 hours as needed.  Importance of Ortho follow-up stressed to patient.  Patient care and treatment were discussed with attending physician Dr. Vallery Ridge.  Discussed results, findings, treatment and follow up. Patient advised of return precautions. Patient verbalized understanding and agreed with plan.   Final Clinical Impression(s) / ED Diagnoses Final diagnoses:  None    Rx / DC Orders ED Discharge Orders     None        Dyann Ruddle 12/21/20 1034    Charlesetta Shanks, MD 01/14/21 1645

## 2020-12-21 NOTE — ED Triage Notes (Signed)
Patient here from home reporting shoulder immobilizer/sling "is not working". Reports injury to left shoulder on 9/22 when he fell down stairs.

## 2020-12-21 NOTE — Discharge Instructions (Addendum)
You came to the emergency department today to be evaluated for your continued left upper arm pain.  Your physical exam was reassuring.  Please keep your sling on.  You can read attached paperwork for proper sling use.  I have increased your pain medication, please take this medication as prescribed.  It is very important that you follow-up with Dr. Doreatha Martin.  Please call their office tomorrow to schedule a follow-up appointment.  Get help right away if: Your skin or fingers on your injured arm turn blue or gray. Your arm feels cold or numb. You have severe pain in your injured arm.

## 2020-12-22 ENCOUNTER — Encounter (HOSPITAL_COMMUNITY): Payer: Self-pay | Admitting: Orthopedic Surgery

## 2020-12-22 ENCOUNTER — Other Ambulatory Visit: Payer: Self-pay

## 2020-12-22 DIAGNOSIS — S42292A Other displaced fracture of upper end of left humerus, initial encounter for closed fracture: Secondary | ICD-10-CM | POA: Diagnosis not present

## 2020-12-22 NOTE — Progress Notes (Signed)
Need orders in epic.  Surgery on 12/23/20.

## 2020-12-22 NOTE — Progress Notes (Signed)
Called pt and made sure that he had phone number for [facility] if he had any questions or concerns in am of 9/27 before he arrived.  Gave pt the phone number of [phone].

## 2020-12-22 NOTE — Progress Notes (Addendum)
Anesthesia Review:  PCP: stephen hunter Last visit was a video visit on 11/20/20.   Cardiologist : none  Chest x-ray : EKG :12/23/20  Echo : Stress test: Cardiac Cath :  Activity level: can do a flight of stairs without cifficulty  Sleep Study/ CPAP : none  Fasting Blood Sugar :      / Checks Blood Sugar -- times a day:   Blood Thinner/ Instructions /Last Dose: ASA / Instructions/ Last Dose :   Dm- -type 2  Hgba1c-12/23/20.   Last hgba1c was 10.8 on 07/15/20  Completed medical hx and prep instructions via phone.  Pt voiced understanding.  PT is a DM- type 2.  No solid foods after midnite.  Clear liquids from 12 midnite until 1145am on 12/23/20.  Pt instructed to eat a good healthy snack prior to bedtime such a speanut butter crackers, cheese crackers or [place] sandwich.    Reviewed clear liquid diet with pt.  PT voiced understanding.   Pt aware to take no DM meds am of surgery.  Pt aware to only take 21 units of Humulin 70/30 evening dose on 12/22/20.  Pt voied understanding.  PT aware to take a sponge bath tonite before going to bed and in am before coming to hospital.   Note via epic sent to DR Supple requesting orders.

## 2020-12-23 ENCOUNTER — Encounter (HOSPITAL_COMMUNITY)
Admission: RE | Disposition: A | Discharge status: Home / Self Care | Payer: Self-pay | Source: Ambulatory Visit | Attending: Orthopedic Surgery

## 2020-12-23 ENCOUNTER — Ambulatory Visit (HOSPITAL_COMMUNITY): Payer: BC Managed Care – PPO

## 2020-12-23 ENCOUNTER — Ambulatory Visit (HOSPITAL_COMMUNITY)
Admission: RE | Admit: 2020-12-23 | Discharge: 2020-12-24 | Disposition: A | Discharge status: Home / Self Care | Payer: BC Managed Care – PPO | Source: Ambulatory Visit | Attending: Orthopedic Surgery | Admitting: Orthopedic Surgery

## 2020-12-23 ENCOUNTER — Encounter (HOSPITAL_COMMUNITY): Payer: Self-pay | Admitting: Orthopedic Surgery

## 2020-12-23 ENCOUNTER — Ambulatory Visit (HOSPITAL_COMMUNITY): Payer: BC Managed Care – PPO | Admitting: Physician Assistant

## 2020-12-23 DIAGNOSIS — S42309A Unspecified fracture of shaft of humerus, unspecified arm, initial encounter for closed fracture: Secondary | ICD-10-CM

## 2020-12-23 DIAGNOSIS — Z9889 Other specified postprocedural states: Secondary | ICD-10-CM

## 2020-12-23 DIAGNOSIS — W109XXA Fall (on) (from) unspecified stairs and steps, initial encounter: Secondary | ICD-10-CM | POA: Diagnosis not present

## 2020-12-23 DIAGNOSIS — Z85820 Personal history of malignant melanoma of skin: Secondary | ICD-10-CM | POA: Diagnosis not present

## 2020-12-23 DIAGNOSIS — Z79899 Other long term (current) drug therapy: Secondary | ICD-10-CM | POA: Insufficient documentation

## 2020-12-23 DIAGNOSIS — I10 Essential (primary) hypertension: Secondary | ICD-10-CM | POA: Diagnosis not present

## 2020-12-23 DIAGNOSIS — Z87891 Personal history of nicotine dependence: Secondary | ICD-10-CM | POA: Insufficient documentation

## 2020-12-23 DIAGNOSIS — S42292A Other displaced fracture of upper end of left humerus, initial encounter for closed fracture: Secondary | ICD-10-CM | POA: Diagnosis not present

## 2020-12-23 DIAGNOSIS — Z885 Allergy status to narcotic agent status: Secondary | ICD-10-CM | POA: Insufficient documentation

## 2020-12-23 DIAGNOSIS — Z7984 Long term (current) use of oral hypoglycemic drugs: Secondary | ICD-10-CM | POA: Diagnosis not present

## 2020-12-23 DIAGNOSIS — Z8781 Personal history of (healed) traumatic fracture: Secondary | ICD-10-CM

## 2020-12-23 DIAGNOSIS — S42232A 3-part fracture of surgical neck of left humerus, initial encounter for closed fracture: Secondary | ICD-10-CM | POA: Diagnosis not present

## 2020-12-23 DIAGNOSIS — G8918 Other acute postprocedural pain: Secondary | ICD-10-CM | POA: Diagnosis not present

## 2020-12-23 DIAGNOSIS — S42202A Unspecified fracture of upper end of left humerus, initial encounter for closed fracture: Secondary | ICD-10-CM | POA: Insufficient documentation

## 2020-12-23 DIAGNOSIS — Z23 Encounter for immunization: Secondary | ICD-10-CM | POA: Insufficient documentation

## 2020-12-23 DIAGNOSIS — Z9181 History of falling: Secondary | ICD-10-CM | POA: Diagnosis not present

## 2020-12-23 DIAGNOSIS — Z794 Long term (current) use of insulin: Secondary | ICD-10-CM | POA: Insufficient documentation

## 2020-12-23 DIAGNOSIS — E1159 Type 2 diabetes mellitus with other circulatory complications: Secondary | ICD-10-CM | POA: Diagnosis not present

## 2020-12-23 HISTORY — PX: ORIF HUMERUS FRACTURE: SHX2126

## 2020-12-23 HISTORY — DX: Other specified postprocedural states: Z98.890

## 2020-12-23 HISTORY — DX: Nausea with vomiting, unspecified: R11.2

## 2020-12-23 LAB — BASIC METABOLIC PANEL
Anion gap: 14 (ref 5–15)
BUN: 17 mg/dL (ref 8–23)
CO2: 24 mmol/L (ref 22–32)
Calcium: 8.8 mg/dL — ABNORMAL LOW (ref 8.9–10.3)
Chloride: 91 mmol/L — ABNORMAL LOW (ref 98–111)
Creatinine, Ser: 1.12 mg/dL (ref 0.61–1.24)
GFR, Estimated: >60 mL/min (ref >60)
Glucose, Bld: 313 mg/dL — ABNORMAL HIGH (ref 70–99)
Potassium: 5.1 mmol/L (ref 3.5–5.1)
Sodium: 129 mmol/L — ABNORMAL LOW (ref 135–145)

## 2020-12-23 LAB — CBC
HCT: 34.3 % — ABNORMAL LOW (ref 39.0–52.0)
Hemoglobin: 10.9 g/dL — ABNORMAL LOW (ref 13.0–17.0)
MCH: 28.9 pg (ref 26.0–34.0)
MCHC: 31.8 g/dL (ref 30.0–36.0)
MCV: 91 fL (ref 80.0–100.0)
Platelets: 271 10*3/uL (ref 150–400)
RBC: 3.77 MIL/uL — ABNORMAL LOW (ref 4.22–5.81)
RDW: 13.2 % (ref 11.5–15.5)
WBC: 6.7 10*3/uL (ref 4.0–10.5)
nRBC: 0 % (ref 0.0–0.2)

## 2020-12-23 LAB — GLUCOSE, CAPILLARY
Glucose-Capillary: 184 mg/dL — ABNORMAL HIGH (ref 70–99)
Glucose-Capillary: 184 mg/dL — ABNORMAL HIGH (ref 70–99)
Glucose-Capillary: 186 mg/dL — ABNORMAL HIGH (ref 70–99)
Glucose-Capillary: 198 mg/dL — ABNORMAL HIGH (ref 70–99)

## 2020-12-23 SURGERY — OPEN REDUCTION INTERNAL FIXATION (ORIF) PROXIMAL HUMERUS FRACTURE
Anesthesia: General | Laterality: Left

## 2020-12-23 MED ORDER — METHOCARBAMOL 1000 MG/10ML IJ SOLN
500.0000 mg | Freq: Four times a day (QID) | INTRAVENOUS | Status: DC | PRN
  Filled 2020-12-23: qty 5

## 2020-12-23 MED ORDER — PROPOFOL 10 MG/ML IV BOLUS
INTRAVENOUS | Status: DC | PRN
  Administered 2020-12-23: 150 mg via INTRAVENOUS
  Administered 2020-12-23: 50 mg via INTRAVENOUS

## 2020-12-23 MED ORDER — DOCUSATE SODIUM 100 MG PO CAPS
100.0000 mg | ORAL_CAPSULE | Freq: Two times a day (BID) | ORAL | Status: DC
  Administered 2020-12-23 – 2020-12-24 (×2): 100 mg via ORAL
  Filled 2020-12-23 (×2): qty 1

## 2020-12-23 MED ORDER — HYDROMORPHONE HCL 1 MG/ML IJ SOLN: 0.2500 mg | INTRAMUSCULAR | Status: DC | PRN

## 2020-12-23 MED ORDER — INFLUENZA VAC SPLIT QUAD 0.5 ML IM SUSY
0.5000 mL | PREFILLED_SYRINGE | INTRAMUSCULAR | Status: AC
  Administered 2020-12-24: 0.5 mL via INTRAMUSCULAR
  Filled 2020-12-23: qty 0.5

## 2020-12-23 MED ORDER — CYCLOBENZAPRINE HCL 10 MG PO TABS: 10.0000 mg | ORAL_TABLET | Freq: Three times a day (TID) | ORAL | 1 refills | Status: DC | PRN

## 2020-12-23 MED ORDER — BISACODYL 5 MG PO TBEC: 5.0000 mg | DELAYED_RELEASE_TABLET | Freq: Every day | ORAL | Status: DC | PRN

## 2020-12-23 MED ORDER — METHOCARBAMOL 500 MG PO TABS
500.0000 mg | ORAL_TABLET | Freq: Four times a day (QID) | ORAL | Status: DC | PRN
  Administered 2020-12-23 – 2020-12-24 (×2): 500 mg via ORAL
  Filled 2020-12-23 (×2): qty 1

## 2020-12-23 MED ORDER — CEFAZOLIN IN SODIUM CHLORIDE 3-0.9 GM/100ML-% IV SOLN
3.0000 g | INTRAVENOUS | Status: AC
  Administered 2020-12-23: 3 g via INTRAVENOUS
  Filled 2020-12-23: qty 100

## 2020-12-23 MED ORDER — ONDANSETRON HCL 4 MG/2ML IJ SOLN
INTRAMUSCULAR | Status: DC | PRN
  Administered 2020-12-23: 4 mg via INTRAVENOUS

## 2020-12-23 MED ORDER — METOCLOPRAMIDE HCL 5 MG PO TABS: 5.0000 mg | ORAL_TABLET | Freq: Three times a day (TID) | ORAL | Status: DC | PRN

## 2020-12-23 MED ORDER — VANCOMYCIN HCL 1000 MG IV SOLR
INTRAVENOUS | Status: AC
  Filled 2020-12-23: qty 20

## 2020-12-23 MED ORDER — INSULIN ASPART 100 UNIT/ML IJ SOLN
INTRAMUSCULAR | Status: DC | PRN
  Administered 2020-12-23: 10 [IU] via INTRAVENOUS

## 2020-12-23 MED ORDER — FENTANYL CITRATE PF 50 MCG/ML IJ SOSY
50.0000 ug | PREFILLED_SYRINGE | INTRAMUSCULAR | Status: DC
Start: 2020-12-23 — End: 2020-12-23
  Administered 2020-12-23: 100 ug via INTRAVENOUS
  Filled 2020-12-23: qty 2

## 2020-12-23 MED ORDER — 0.9 % SODIUM CHLORIDE (POUR BTL) OPTIME
TOPICAL | Status: DC | PRN
  Administered 2020-12-23: 1000 mL

## 2020-12-23 MED ORDER — FENTANYL CITRATE (PF) 100 MCG/2ML IJ SOLN
INTRAMUSCULAR | Status: AC
  Filled 2020-12-23: qty 2

## 2020-12-23 MED ORDER — PHENYLEPHRINE HCL (PRESSORS) 10 MG/ML IV SOLN
INTRAVENOUS | Status: AC
  Filled 2020-12-23: qty 2

## 2020-12-23 MED ORDER — FENTANYL CITRATE (PF) 100 MCG/2ML IJ SOLN
INTRAMUSCULAR | Status: DC | PRN
  Administered 2020-12-23: 100 ug via INTRAVENOUS

## 2020-12-23 MED ORDER — ACETAMINOPHEN 500 MG PO TABS
1000.0000 mg | ORAL_TABLET | Freq: Once | ORAL | Status: AC
  Administered 2020-12-23: 1000 mg via ORAL
  Filled 2020-12-23: qty 2

## 2020-12-23 MED ORDER — ONDANSETRON HCL 4 MG PO TABS: 4.0000 mg | ORAL_TABLET | Freq: Three times a day (TID) | ORAL | 0 refills | Status: DC | PRN

## 2020-12-23 MED ORDER — SUGAMMADEX SODIUM 500 MG/5ML IV SOLN
INTRAVENOUS | Status: AC
  Filled 2020-12-23: qty 5

## 2020-12-23 MED ORDER — CHLORHEXIDINE GLUCONATE 0.12 % MT SOLN
15.0000 mL | Freq: Once | OROMUCOSAL | Status: AC
  Administered 2020-12-23: 15 mL via OROMUCOSAL

## 2020-12-23 MED ORDER — HYDROMORPHONE HCL 1 MG/ML IJ SOLN
0.5000 mg | INTRAMUSCULAR | Status: DC | PRN
  Administered 2020-12-24: 1 mg via INTRAVENOUS
  Filled 2020-12-23: qty 1

## 2020-12-23 MED ORDER — ARIPIPRAZOLE 5 MG PO TABS
5.0000 mg | ORAL_TABLET | Freq: Every day | ORAL | Status: DC
  Administered 2020-12-23 – 2020-12-24 (×2): 5 mg via ORAL
  Filled 2020-12-23 (×2): qty 1

## 2020-12-23 MED ORDER — PROPOFOL 10 MG/ML IV BOLUS
INTRAVENOUS | Status: AC
  Filled 2020-12-23: qty 20

## 2020-12-23 MED ORDER — PHENYLEPHRINE HCL-NACL 20-0.9 MG/250ML-% IV SOLN
INTRAVENOUS | Status: DC | PRN
Start: 2020-12-23 — End: 2020-12-23
  Administered 2020-12-23: 20 ug/min via INTRAVENOUS

## 2020-12-23 MED ORDER — INSULIN ASPART 100 UNIT/ML IJ SOLN
INTRAMUSCULAR | Status: AC
  Filled 2020-12-23: qty 1

## 2020-12-23 MED ORDER — INSULIN ASPART 100 UNIT/ML IJ SOLN
0.0000 [IU] | Freq: Three times a day (TID) | INTRAMUSCULAR | Status: DC
  Administered 2020-12-24: 5 [IU] via SUBCUTANEOUS

## 2020-12-23 MED ORDER — BENAZEPRIL HCL 20 MG PO TABS
40.0000 mg | ORAL_TABLET | Freq: Every day | ORAL | Status: DC
  Administered 2020-12-23 – 2020-12-24 (×2): 40 mg via ORAL
  Filled 2020-12-23 (×2): qty 2

## 2020-12-23 MED ORDER — OXYCODONE HCL 5 MG PO TABS
10.0000 mg | ORAL_TABLET | ORAL | Status: DC | PRN
  Administered 2020-12-23: 10 mg via ORAL
  Administered 2020-12-24: 15 mg via ORAL
  Filled 2020-12-23: qty 3

## 2020-12-23 MED ORDER — AMLODIPINE BESY-BENAZEPRIL HCL 5-40 MG PO CAPS: 1.0000 | ORAL_CAPSULE | Freq: Every day | ORAL | Status: DC

## 2020-12-23 MED ORDER — ORAL CARE MOUTH RINSE: 15.0000 mL | Freq: Once | OROMUCOSAL | Status: AC

## 2020-12-23 MED ORDER — ONDANSETRON HCL 4 MG/2ML IJ SOLN
INTRAMUSCULAR | Status: AC
  Filled 2020-12-23: qty 2

## 2020-12-23 MED ORDER — DEXAMETHASONE SODIUM PHOSPHATE 10 MG/ML IJ SOLN
INTRAMUSCULAR | Status: AC
  Filled 2020-12-23: qty 1

## 2020-12-23 MED ORDER — LIDOCAINE HCL (PF) 2 % IJ SOLN
INTRAMUSCULAR | Status: AC
  Filled 2020-12-23: qty 5

## 2020-12-23 MED ORDER — ROCURONIUM BROMIDE 10 MG/ML (PF) SYRINGE
PREFILLED_SYRINGE | INTRAVENOUS | Status: AC
  Filled 2020-12-23: qty 10

## 2020-12-23 MED ORDER — TRANEXAMIC ACID-NACL 1000-0.7 MG/100ML-% IV SOLN
1000.0000 mg | INTRAVENOUS | Status: AC
  Administered 2020-12-23: 1000 mg via INTRAVENOUS
  Filled 2020-12-23: qty 100

## 2020-12-23 MED ORDER — METOCLOPRAMIDE HCL 5 MG/ML IJ SOLN: 5.0000 mg | Freq: Three times a day (TID) | INTRAMUSCULAR | Status: DC | PRN

## 2020-12-23 MED ORDER — PHENOL 1.4 % MT LIQD: 1.0000 | OROMUCOSAL | Status: DC | PRN

## 2020-12-23 MED ORDER — OXYCODONE HCL 5 MG PO TABS
5.0000 mg | ORAL_TABLET | ORAL | Status: DC | PRN
  Filled 2020-12-23: qty 2

## 2020-12-23 MED ORDER — MIDAZOLAM HCL 2 MG/2ML IJ SOLN
1.0000 mg | INTRAMUSCULAR | Status: DC
  Administered 2020-12-23: 2 mg via INTRAVENOUS
  Filled 2020-12-23: qty 2

## 2020-12-23 MED ORDER — BUPIVACAINE-EPINEPHRINE (PF) 0.5% -1:200000 IJ SOLN
INTRAMUSCULAR | Status: DC | PRN
  Administered 2020-12-23: 15 mL via PERINEURAL

## 2020-12-23 MED ORDER — SUGAMMADEX SODIUM 200 MG/2ML IV SOLN
INTRAVENOUS | Status: DC | PRN
  Administered 2020-12-23: 300 mg via INTRAVENOUS

## 2020-12-23 MED ORDER — ONDANSETRON HCL 4 MG/2ML IJ SOLN: 4.0000 mg | Freq: Four times a day (QID) | INTRAMUSCULAR | Status: DC | PRN

## 2020-12-23 MED ORDER — LACTATED RINGERS IV SOLN: INTRAVENOUS | Status: DC

## 2020-12-23 MED ORDER — PANTOPRAZOLE SODIUM 40 MG PO TBEC
40.0000 mg | DELAYED_RELEASE_TABLET | Freq: Every day | ORAL | Status: DC
  Administered 2020-12-24: 40 mg via ORAL
  Filled 2020-12-23: qty 1

## 2020-12-23 MED ORDER — DIPHENHYDRAMINE HCL 12.5 MG/5ML PO ELIX: 12.5000 mg | ORAL_SOLUTION | ORAL | Status: DC | PRN

## 2020-12-23 MED ORDER — BUPIVACAINE LIPOSOME 1.3 % IJ SUSP
INTRAMUSCULAR | Status: DC | PRN
  Administered 2020-12-23: 10 mL via PERINEURAL

## 2020-12-23 MED ORDER — OXYCODONE-ACETAMINOPHEN 5-325 MG PO TABS: 1.0000 | ORAL_TABLET | ORAL | 0 refills | Status: DC | PRN

## 2020-12-23 MED ORDER — MENTHOL 3 MG MT LOZG: 1.0000 | LOZENGE | OROMUCOSAL | Status: DC | PRN

## 2020-12-23 MED ORDER — ONDANSETRON HCL 4 MG PO TABS: 4.0000 mg | ORAL_TABLET | Freq: Four times a day (QID) | ORAL | Status: DC | PRN

## 2020-12-23 MED ORDER — LIDOCAINE HCL (CARDIAC) PF 100 MG/5ML IV SOSY
PREFILLED_SYRINGE | INTRAVENOUS | Status: DC | PRN
  Administered 2020-12-23: 20 mg via INTRAVENOUS

## 2020-12-23 MED ORDER — AMLODIPINE BESYLATE 5 MG PO TABS
5.0000 mg | ORAL_TABLET | Freq: Every day | ORAL | Status: DC
  Administered 2020-12-23 – 2020-12-24 (×2): 5 mg via ORAL
  Filled 2020-12-23 (×2): qty 1

## 2020-12-23 MED ORDER — ROCURONIUM BROMIDE 10 MG/ML (PF) SYRINGE
PREFILLED_SYRINGE | INTRAVENOUS | Status: DC | PRN
  Administered 2020-12-23: 10 mg via INTRAVENOUS
  Administered 2020-12-23: 60 mg via INTRAVENOUS

## 2020-12-23 MED ORDER — POLYETHYLENE GLYCOL 3350 17 G PO PACK: 17.0000 g | PACK | Freq: Every day | ORAL | Status: DC | PRN

## 2020-12-23 MED ORDER — ACETAMINOPHEN 325 MG PO TABS
325.0000 mg | ORAL_TABLET | Freq: Four times a day (QID) | ORAL | Status: DC | PRN
  Administered 2020-12-23 – 2020-12-24 (×2): 650 mg via ORAL
  Filled 2020-12-23 (×2): qty 2

## 2020-12-23 SURGICAL SUPPLY — 67 items
BAG COUNTER SPONGE SURGICOUNT (BAG) IMPLANT
BAG ZIPLOCK 12X15 (MISCELLANEOUS) IMPLANT
BIT DRILL 3.2 (BIT) ×2
BIT DRILL 3.2XCALB NS DISP (BIT) ×1 IMPLANT
BIT DRILL CALIBRATED 2.7 (BIT) ×2 IMPLANT
BIT DRL 3.2XCALB NS DISP (BIT) ×1
COVER SURGICAL LIGHT HANDLE (MISCELLANEOUS) ×2 IMPLANT
DERMABOND ADVANCED (GAUZE/BANDAGES/DRESSINGS) ×1
DERMABOND ADVANCED .7 DNX12 (GAUZE/BANDAGES/DRESSINGS) ×1 IMPLANT
DRAPE C-ARM 42X120 X-RAY (DRAPES) ×2 IMPLANT
DRAPE C-ARMOR (DRAPES) IMPLANT
DRAPE ORTHO SPLIT 77X108 STRL (DRAPES) ×4
DRAPE SURG 17X11 SM STRL (DRAPES) ×2 IMPLANT
DRAPE SURG ORHT 6 SPLT 77X108 (DRAPES) ×2 IMPLANT
DRAPE U-SHAPE 47X51 STRL (DRAPES) ×2 IMPLANT
DRESSING AQUACEL AG SP 3.5X10 (GAUZE/BANDAGES/DRESSINGS) ×1 IMPLANT
DRSG AQUACEL AG ADV 3.5X 6 (GAUZE/BANDAGES/DRESSINGS) IMPLANT
DRSG AQUACEL AG ADV 3.5X10 (GAUZE/BANDAGES/DRESSINGS) IMPLANT
DRSG AQUACEL AG SP 3.5X10 (GAUZE/BANDAGES/DRESSINGS) ×2
DURAPREP 26ML APPLICATOR (WOUND CARE) ×2 IMPLANT
ELECT BLADE TIP CTD 4 INCH (ELECTRODE) ×2 IMPLANT
ELECT REM PT RETURN 15FT ADLT (MISCELLANEOUS) ×2 IMPLANT
GLOVE SURG ENC MOIS LTX SZ7.5 (GLOVE) ×2 IMPLANT
GLOVE SURG ENC MOIS LTX SZ8 (GLOVE) ×2 IMPLANT
GLOVE SURG MICRO LTX SZ7 (GLOVE) ×2 IMPLANT
GLOVE SURG MICRO LTX SZ7.5 (GLOVE) ×2 IMPLANT
GOWN STRL REUS W/TWL LRG LVL3 (GOWN DISPOSABLE) ×4 IMPLANT
K-WIRE 2X5 SS THRDED S3 (WIRE) ×6
KIT BASIN OR (CUSTOM PROCEDURE TRAY) ×2 IMPLANT
KIT TURNOVER KIT A (KITS) ×2 IMPLANT
KWIRE 2X5 SS THRDED S3 (WIRE) ×3 IMPLANT
MANIFOLD NEPTUNE II (INSTRUMENTS) ×2 IMPLANT
NEEDLE TAPERED W/ NITINOL LOOP (MISCELLANEOUS) ×2 IMPLANT
NS IRRIG 1000ML POUR BTL (IV SOLUTION) ×2 IMPLANT
PACK SHOULDER (CUSTOM PROCEDURE TRAY) ×2 IMPLANT
PAD ARMBOARD 7.5X6 YLW CONV (MISCELLANEOUS) ×4 IMPLANT
PEG LOCKING 3.2MMX44 (Peg) ×4 IMPLANT
PEG LOCKING 3.2MMX46 (Peg) ×6 IMPLANT
PEG LOCKING 3.2MMX54 (Peg) ×2 IMPLANT
PEG LOCKING 3.2MMX56 (Peg) ×2 IMPLANT
PEG LOCKING 3.2X 60MM (Peg) ×4 IMPLANT
PEG LOCKING 3.2X42 (Screw) ×2 IMPLANT
PEG LOCKING 3.2X48 (Peg) ×4 IMPLANT
PEG LOCKING 3.2X50 (Screw) ×2 IMPLANT
PLATE PROX HUMERUS HI LT 4H (Plate) ×2 IMPLANT
PROTECTOR NERVE ULNAR (MISCELLANEOUS) ×2 IMPLANT
PUTTY DBM STAGRAFT PLUS 10CC (Putty) ×2 IMPLANT
RESTRAINT HEAD UNIVERSAL NS (MISCELLANEOUS) ×2 IMPLANT
SCREW LOW PROF TIS 3.5X28MM (Screw) ×8 IMPLANT
SLEEVE MEASURING 3.2 (BIT) ×2 IMPLANT
SLING ARM FOAM STRAP LRG (SOFTGOODS) IMPLANT
SLING ARM FOAM STRAP MED (SOFTGOODS) IMPLANT
SLING ARM FOAM STRAP SML (SOFTGOODS) IMPLANT
SLING ARM IMMOBILIZER XL (CAST SUPPLIES) ×2 IMPLANT
SUCTION FRAZIER HANDLE 12FR (TUBING) ×2
SUCTION TUBE FRAZIER 12FR DISP (TUBING) ×1 IMPLANT
SUT FIBERTAPE CERCLAGE 2 48 (SUTURE) ×4 IMPLANT
SUT FIBERWIRE #2 38 T-5 BLUE (SUTURE) ×4
SUT MNCRL AB 3-0 PS2 18 (SUTURE) ×2 IMPLANT
SUT MON AB 2-0 CT1 36 (SUTURE) ×2 IMPLANT
SUT VIC AB 1 CT1 36 (SUTURE) ×2 IMPLANT
SUTURE FIBERWR #2 38 T-5 BLUE (SUTURE) ×2 IMPLANT
SUTURE TAPE TIGERLINK 1.3MM BL (SUTURE) ×1 IMPLANT
SUTURETAPE TIGERLINK 1.3MM BL (SUTURE) ×2
TOWEL OR 17X26 10 PK STRL BLUE (TOWEL DISPOSABLE) ×2 IMPLANT
TOWEL OR NON WOVEN STRL DISP B (DISPOSABLE) ×2 IMPLANT
YANKAUER SUCT BULB TIP 10FT TU (MISCELLANEOUS) ×2 IMPLANT

## 2020-12-23 NOTE — Transfer of Care (Signed)
Immediate Anesthesia Transfer of Care Note  Patient: [name]  Procedure(s) Performed: OPEN REDUCTION INTERNAL FIXATION (ORIF) PROXIMAL HUMERUS FRACTURE (Left)  Patient Location: PACU  Anesthesia Type:General  Level of Consciousness: awake, alert  and patient cooperative  Airway & Oxygen Therapy: Patient Spontanous Breathing and Patient connected to face mask oxygen  Post-op Assessment: Report given to RN and Post -op Vital signs reviewed and stable  Post vital signs: Reviewed and stable  Last Vitals:  Vitals Value Taken Time  BP 134/74 12/23/20 1831  Temp    Pulse 89 12/23/20 1834  Resp 25 12/23/20 1834  SpO2 93 % 12/23/20 1834  Vitals shown include unvalidated device data.  Last Pain:  Vitals:   12/23/20 1249  TempSrc: Oral  PainSc: 8       Patients Stated Pain Goal: 4 (54/36/06 7703)  Complications: No notable events documented.

## 2020-12-23 NOTE — Anesthesia Preprocedure Evaluation (Addendum)
Anesthesia Evaluation  Patient identified by MRN, date of birth, ID band Patient awake    Reviewed: Allergy & Precautions, H&P , NPO status , Patient's Chart, lab work & pertinent test results  History of Anesthesia Complications (+) PONV  Airway Mallampati: III  TM Distance: >3 FB Neck ROM: Full  Mouth opening: Limited Mouth Opening  Dental no notable dental hx. (+) Teeth Intact, Dental Advisory Given   Pulmonary neg pulmonary ROS, former smoker,    Pulmonary exam normal breath sounds clear to auscultation       Cardiovascular hypertension, Pt. on medications  Rhythm:Regular Rate:Normal     Neuro/Psych Depression negative neurological ROS     GI/Hepatic Neg liver ROS, GERD  Medicated,  Endo/Other  diabetes, Insulin Dependent, Oral Hypoglycemic AgentsMorbid obesity  Renal/GU negative Renal ROS  negative genitourinary   Musculoskeletal   Abdominal   Peds  Hematology negative hematology ROS (+)   Anesthesia Other Findings   Reproductive/Obstetrics negative OB ROS                            Anesthesia Physical Anesthesia Plan  ASA: 3  Anesthesia Plan: General   Post-op Pain Management:  Regional for Post-op pain   Induction: Intravenous  PONV Risk Score and Plan: 4 or greater and Ondansetron, Dexamethasone and Midazolam  Airway Management Planned: Oral ETT  Additional Equipment:   Intra-op Plan:   Post-operative Plan: Extubation in OR  Informed Consent: I have reviewed the patients History and Physical, chart, labs and discussed the procedure including the risks, benefits and alternatives for the proposed anesthesia with the patient or authorized representative who has indicated his/her understanding and acceptance.     Dental advisory given  Plan Discussed with: CRNA  Anesthesia Plan Comments:         Anesthesia Quick Evaluation

## 2020-12-23 NOTE — Plan of Care (Signed)
Plan of care discussed.   

## 2020-12-23 NOTE — Anesthesia Procedure Notes (Signed)
Anesthesia Regional Block: Interscalene brachial plexus block   Pre-Anesthetic Checklist: , timeout performed,  Correct Patient, Correct Site, Correct Laterality,  Correct Procedure, Correct Position, site marked,  Risks and benefits discussed,  Pre-op evaluation,  At surgeon's request and post-op pain management  Laterality: Left  Prep: Maximum Sterile Barrier Precautions used, chloraprep       Needles:  Injection technique: Single-shot  Needle Type: Echogenic Stimulator Needle     Needle Length: 5cm  Needle Gauge: 22     Additional Needles:   Procedures:, nerve stimulator,,, ultrasound used (permanent image in chart),,     Nerve Stimulator or Paresthesia:  Response: Biceps response, 0.6 mA  Additional Responses:   Narrative:  Start time: 12/23/2020 2:05 PM End time: 12/23/2020 2:15 PM Injection made incrementally with aspirations every 5 mL. Anesthesiologist: Roderic Palau, MD

## 2020-12-23 NOTE — Anesthesia Procedure Notes (Signed)
Procedure Name: Intubation Date/Time: 12/23/2020 3:24 PM Performed by: Annye Asa, MD Pre-anesthesia Checklist: Patient identified, Emergency Drugs available, Suction available and Patient being monitored Patient Re-evaluated:Patient Re-evaluated prior to induction Oxygen Delivery Method: Circle system utilized Preoxygenation: Pre-oxygenation with 100% oxygen Induction Type: IV induction Ventilation: Mask ventilation without difficulty and Oral airway inserted - appropriate to patient size Laryngoscope Size: Glidescope and 4 Grade View: Grade I Tube type: Oral Tube size: 7.5 mm Number of attempts: 3 Airway Equipment and Method: Video-laryngoscopy and Stylet Placement Confirmation: ETT inserted through vocal cords under direct vision, positive ETCO2 and breath sounds checked- equal and bilateral Secured at: 22 cm Tube secured with: Tape Dental Injury: Teeth and Oropharynx as per pre-operative assessment  Comments: DL x1 with MAC 4 by CRNA--unable to intubate. DL x1 with MAC 4 by MDA. DL x1 with Glidescope 4 by MDA--Grade 1 view.

## 2020-12-23 NOTE — Discharge Instructions (Signed)
Metta Clines. Supple, M.D., F.A.A.O.S. Orthopaedic Surgery Specializing in Arthroscopic and Reconstructive Surgery of the Shoulder [phone] [address] [place], [place] [address] - Fax [phone]   POST-OP SHOULDER INSTRUCTIONS  1. Follow up in the office for your first post-op appointment 10-14 days from the date of your surgery. If you do not already have a scheduled appointment, our office will contact you to schedule.  2. The bandage over your incision is waterproof. You may begin showering with this dressing on. You may leave this dressing on until first follow up appointment within 2 weeks. We prefer you leave this dressing in place until follow up however after 5-7 days if you are having itching or skin irritation and would like to remove it you may do so. Go slow and tug at the borders gently to break the bond the dressing has with the skin. At this point if there is no drainage it is okay to go without a bandage or you may cover it with a light guaze and tape. You can also expect significant bruising around your shoulder that will drift down your arm and into your chest wall. This is very normal and should resolve over several days.   3. Wear your sling/immobilizer at all times except to perform the exercises below or to occasionally let your arm dangle by your side to stretch your elbow. You also need to sleep in your sling immobilizer until instructed otherwise. It is ok to remove your sling if you are sitting in a controlled environment and allow your arm to rest in a position of comfort by your side or on your lap with pillows to give your neck and skin a break from the sling. You may remove it to allow arm to dangle by side to shower. If you are up walking around and when you go to sleep at night you need to wear it.  4. Range of motion to your elbow, wrist, and hand are encouraged 3-5 times daily. Exercise to your hand and fingers helps to reduce swelling you may  experience.   5. Prescriptions for a pain medication and a muscle relaxant are provided for you. It is recommended that if you are experiencing pain that you pain medication alone is not controlling, add the muscle relaxant along with the pain medication which can give additional pain relief. The first 1-2 days is generally the most severe of your pain and then should gradually decrease. As your pain lessens it is recommended that you decrease your use of the pain medications to an "as needed basis'" only and to always comply with the recommended dosages of the pain medications.  6. Pain medications can produce constipation along with their use. If you experience this, the use of an over the counter stool softener or laxative daily is recommended.   7. For additional questions or concerns, please do not hesitate to call the office. If after hours there is an answering service to forward your concerns to the physician on call.  8.Pain control following an exparel block  To help control your post-operative pain you received a nerve block  performed with Exparel which is a long acting anesthetic (numbing agent) which can provide pain relief and sensations of numbness (and relief of pain) in the operative shoulder and arm for up to 3 days. Sometimes it provides mixed relief, meaning you may still have numbness in certain areas of the arm but can still be able to move  parts of that arm, hand, and fingers. We recommend that your prescribed pain medications  be used as needed. We do not feel it is necessary to "pre medicate" and "stay ahead" of pain.  Taking narcotic pain medications when you are not having any pain can lead to unnecessary and potentially dangerous side effects.    9. Use the ice machine as much as possible in the first 5-7 days from surgery, then you can wean its use to as needed. The ice typically needs to be replaced every 6 hours, instead of ice you can actually freeze water bottles to  put in the cooler and then fill water around them to avoid having to purchase ice. You can have spare water bottles freezing to allow you to rotate them once they have melted. Try to have a thin shirt or light cloth or towel under the ice wrap to protect your skin.   FOR ADDITIONAL INFO ON ICE MACHINE AND INSTRUCTIONS GO TO THE WEBSITE AT  [email or web address]    POST-OP EXERCISES  OK to allow arm to dangle and move elbow wrist and hand for hygiene.

## 2020-12-23 NOTE — Anesthesia Postprocedure Evaluation (Signed)
Anesthesia Post Note  Patient: [name]  Procedure(s) Performed: OPEN REDUCTION INTERNAL FIXATION (ORIF) PROXIMAL HUMERUS FRACTURE (Left)     Patient location during evaluation: PACU Anesthesia Type: General Level of consciousness: awake Pain management: pain level controlled Vital Signs Assessment: post-procedure vital signs reviewed and stable Respiratory status: spontaneous breathing Cardiovascular status: stable Postop Assessment: no apparent nausea or vomiting Anesthetic complications: no   No notable events documented.  Last Vitals:  Vitals:   12/23/20 1831 12/23/20 1845  BP: 134/74 (!) 141/70  Pulse: 88 85  Resp: (!) 23 11  Temp: 36.9 C   SpO2: 94% 95%    Last Pain:  Vitals:   12/23/20 1845  TempSrc:   PainSc: 0-No pain                 [name]

## 2020-12-23 NOTE — Progress Notes (Signed)
Assisted Dr. Oren Bracket with left, ultrasound guided, interscalene  block. Side rails up, monitors on throughout procedure. See vital signs in flow sheet. Tolerated Procedure well.

## 2020-12-23 NOTE — H&P (Signed)
[name]    Chief Complaint: Displaced left proximal humerus fracture HPI: The patient is a 60 y.o. male status post fall downstairs with immediate complaints of severe left shoulder pain.  He was seen at an outside facility where x-rays were obtained demonstrating a severely displaced proximal humerus fracture.  He was subsequently referred to our practice for evaluation and take over care.  Past Medical History:  Diagnosis Date   Cancer ([place])    melanoma;  right ear   Depression    Diabetes mellitus    GERD (gastroesophageal reflux disease)    Hyperlipidemia    Hypertension    Metabolic syndrome    Obesity    PONV (postoperative nausea and vomiting)    YRS AGO WITHER ETHER    Past Surgical History:  Procedure Laterality Date   COLONOSCOPY  05/01/2013   CYST REMOVAL TRUNK  1989   benign   MELANOMA EXCISION  2008   right ear and neck   POLYPECTOMY     reconstructive plastic surgery face  1967   dog bite at age 65; about 56 surgeries for 10 years   TONSILLECTOMY  1966   age 86    Family History  Problem Relation Age of Onset   Arthritis Mother    Stroke Father 45       heavy smoker 4-5 packs a day unfiltered   Schizophrenia Father        homeless and did not get medical care in time   Alcoholism Father    Colon cancer Neg Hx    Esophageal cancer Neg Hx    Rectal cancer Neg Hx    Stomach cancer Neg Hx    Colon polyps Neg Hx     Social History:  reports that he quit smoking about 32 years ago. His smoking use included cigarettes. He has a 2.50 pack-year smoking history. He has never used smokeless tobacco. He reports current alcohol use of about 5.0 standard drinks per week. He reports that he does not currently use drugs after having used the following drugs: Marijuana.   Medications Prior to Admission  Medication Sig Dispense Refill   amLODipine-benazepril (LOTREL) 5-40 MG capsule Take 1 capsule by mouth daily. 90 capsule 3   ARIPiprazole (ABILIFY) 5 MG  tablet TAKE 1 TABLET BY MOUTH  DAILY 30 tablet 11   Blood Glucose Monitoring Suppl (House) w/Device KIT 1 each by Does not apply route 3 (three) times daily. 1 kit 0   Continuous Blood Gluc Receiver (FREESTYLE LIBRE 14 DAY READER) DEVI 2 each by Does not apply route daily. 2 each 11   Continuous Blood Gluc Sensor (FREESTYLE LIBRE 14 DAY SENSOR) MISC 2 each by Does not apply route daily. 2 each 11   empagliflozin (JARDIANCE) 10 MG TABS tablet Take One 10 mg Tablet Daily. 90 tablet 3   insulin isophane & regular human KwikPen (HUMULIN 70/30 KWIKPEN) (70-30) 100 UNIT/ML KwikPen Inject 31 Units into the skin 2 (two) times daily with a meal. INJECT 30-50 UNITS SUBCUTANEOUSLY BEFORE BREAKFAST AND DINNER (Patient taking differently: Inject 32-42 Units into the skin See admin instructions. Inject 42 units in the morning and 32 units in the evening) 15 mL 11   lansoprazole (PREVACID) 15 MG capsule Take 15 mg by mouth daily.     ONETOUCH DELICA LANCETS 40J MISC USE TO CHECK BLOOD SUGAR THREE TIMES A DAY AND PRN 100 each 5   oxyCODONE-acetaminophen (PERCOCET/ROXICET) 5-325 MG tablet Take 2  tablets by mouth every 6 (six) hours as needed for up to 3 days for severe pain. 24 tablet 0   rosuvastatin (CRESTOR) 10 MG tablet Take 1 tablet (10 mg total) by mouth daily. 90 tablet 3   glucose blood (ONETOUCH VERIO) test strip USE TO CHECK BLOOD SUGAR THREE TIMES A DAY AND PRN 100 each 5   ondansetron (ZOFRAN ODT) 8 MG disintegrating tablet Take 1 tablet (8 mg total) by mouth every 8 (eight) hours as needed for nausea or vomiting. 20 tablet 0     Physical Exam: Patient is a large statured gentleman in obvious discomfort secondary to left shoulder and arm pain.  Broad areas of resolving ecchymosis noted about the shoulder with diffuse swelling.  He is intact to light touch sensation in the axillary nerve distribution and is grossly neurovascular intact distally in the left upper  extremity.  Radiographs  Plain film x-rays of the left shoulder demonstrate a severely displaced three-part proximal humerus fracture.  Vitals  Temp:  [99.2 F (37.3 C)] 99.2 F (37.3 C) (09/27 1249) Pulse Rate:  [85-89] 87 (09/27 1411) Resp:  [11-20] 16 (09/27 1411) BP: (128-142)/(77-80) 142/79 (09/27 1411) SpO2:  [89 %-97 %] 93 % (09/27 1411) Weight:  [127 kg] 127 kg (09/27 1249)  Assessment/Plan  Impression: Severely displaced left three-part proximal humerus fracture  Plan of Action: Procedure(s): OPEN REDUCTION INTERNAL FIXATION (ORIF) PROXIMAL HUMERUS FRACTURE  [name] [name] 12/23/2020, 2:21 PM Contact # [phone]

## 2020-12-23 NOTE — Op Note (Signed)
12/23/2020  6:02 PM  PATIENT:   [name]  60 y.o. male  PRE-OPERATIVE DIAGNOSIS: Severely displaced left proximal humerus fracture  POST-OPERATIVE DIAGNOSIS: Same  PROCEDURE: Open reduction and internal fixation of severely displaced left three-part proximal humerus fracture with allograft bone grafting of the fracture site  SURGEON:  Arby Dahir, Metta Clines M.D.  ASSISTANTS: Jenetta Loges, PA-C  ANESTHESIA:   General endotracheal and interscalene block with Exparel  EBL: 150 cc  SPECIMEN: None  Drains: None   PATIENT DISPOSITION:  PACU - hemodynamically stable.    PLAN OF CARE: Discharge to home after PACU  Brief history:  Patient is a 60 year old male who fell sustaining a severely displaced left three-part proximal humerus fracture.  Due to the degree of displacement he has been counseled regarding treatment options as well as potential risks versus benefits thereof.  Possible surgical complications reviewed including including the potential for bleeding, infection, neurovascular injury, persistence of pain, loss of motion, anesthetic complication, malunion, nonunion, loss of fixation, and possible need for additional surgery.  He understands, and accepts, and agrees with our planned procedure.  Procedure detail:  After undergoing routine preop evaluation patient received prophylactic antibiotics and interscalene block with Exparel was established in the holding area by the anesthesia department.  Patient was subsequently placed supine on the operating table and underwent the smooth induction of a general endotracheal anesthesia.  Subsequently placed in the beachchair position and appropriately padded and protected.  At this point fluoroscopic imaging was then brought in to confirm proper visualization of the fracture site about the left shoulder.  This point the left shoulder girdle region was then sterilely prepped and draped in standard fashion.  Timeout was called.  A  deltopectoral approach was made to the left shoulder through an approximate 12 cm incision.  Skin flaps were elevated and dissection carried deeply with electrocautery used for hemostasis.  The deltopectoral interval was then developed from proximal to distal with the vein taken laterally.  The deltopectoral interval distally was then divided with elevation of the distal deltoid insertion to allow placement of our proximal humeral plate.  Conjoined tendon mobilized and retracted medially.  We then identified the long head biceps tendon to help [place] identify the proper alignment of the fracture site and the humeral head had displaced posteriorly and the humeral shaft was anteriorly and proximally displaced.  We irrigated soft tissue and clot from the fracture site and manipulated the fracture ultimately gaining proper reduction and then used fluoroscopic imaging to confirm proper alignment and provisionally fastened a 4-hole Biomet proximal humeral plate on the lateral cortex of the proximal humerus and then performed number of manipulations to gain the proper reduction and positioning and also to help reapproximate a butterfly fragment on the humeral neck region.  Once we are satisfied with the overall alignment we then passed a guidepin up into the humeral head through the plate in the "center/center position.  Once we are pleased with the position and alignment we then provisionally fixed into the humeral head with a number of pegs and then into the humeral shaft with 2 cortical screws.  Once we were certain that the overall alignment was appropriate into our satisfaction we then completed fixation with the balance of the pegs up to the humeral head and total of 4 screws into the shaft with good fixation.  We then used live fluoroscopic imaging to confirm that all screws were properly positioned with the humeral head and we did go ahead  and adjust some screw lengths to assure that each screw was properly positioned  and of appropriate length and were maintained within the humeral head.  At this point we then passed to Arthrex fiber cerclage tapes around the humeral neck to transfix the displaced butterfly fragment and these were then appropriately tightened and final fluoroscopic imaging confirmed good position good alignment at the fracture site and good position of the hardware.  The fiber tapes were then tied and trimmed appropriately.  Final irrigation was completed completed.  Hemostasis was obtained.  We did pack 10 cc of DBX bone putty into the fracture site.  The deltopectoral interval was then reapproximated with a series of figure-of-eight #1 Vicryl sutures.  2-0 Monocryl used for the subcu layer and intracuticular 3-0 Monocryl for the skin followed by Dermabond and Aquacel dressing.  The left arm was then placed into a sling and the patient was then awakened, extubated, and taken to the recovery room in stable condition.  Jenetta Loges, PA-C was utilized as an Environmental consultant throughout this case, essential for help with positioning the patient, positioning extremity, tissue manipulation, implantation of the prosthesis, suture management, wound closure, and intraoperative decision-making.  Marin Shutter MD   Contact # [phone]

## 2020-12-24 ENCOUNTER — Encounter (HOSPITAL_COMMUNITY): Payer: Self-pay | Admitting: Orthopedic Surgery

## 2020-12-24 DIAGNOSIS — W109XXA Fall (on) (from) unspecified stairs and steps, initial encounter: Secondary | ICD-10-CM | POA: Diagnosis not present

## 2020-12-24 DIAGNOSIS — Z885 Allergy status to narcotic agent status: Secondary | ICD-10-CM | POA: Diagnosis not present

## 2020-12-24 DIAGNOSIS — Z87891 Personal history of nicotine dependence: Secondary | ICD-10-CM | POA: Diagnosis not present

## 2020-12-24 DIAGNOSIS — Z79899 Other long term (current) drug therapy: Secondary | ICD-10-CM | POA: Diagnosis not present

## 2020-12-24 DIAGNOSIS — S42202A Unspecified fracture of upper end of left humerus, initial encounter for closed fracture: Secondary | ICD-10-CM | POA: Diagnosis not present

## 2020-12-24 DIAGNOSIS — Z7984 Long term (current) use of oral hypoglycemic drugs: Secondary | ICD-10-CM | POA: Diagnosis not present

## 2020-12-24 DIAGNOSIS — Z23 Encounter for immunization: Secondary | ICD-10-CM | POA: Diagnosis not present

## 2020-12-24 DIAGNOSIS — Z794 Long term (current) use of insulin: Secondary | ICD-10-CM | POA: Diagnosis not present

## 2020-12-24 DIAGNOSIS — Z9181 History of falling: Secondary | ICD-10-CM | POA: Diagnosis not present

## 2020-12-24 DIAGNOSIS — Z85820 Personal history of malignant melanoma of skin: Secondary | ICD-10-CM | POA: Diagnosis not present

## 2020-12-24 LAB — GLUCOSE, CAPILLARY: Glucose-Capillary: 232 mg/dL — ABNORMAL HIGH (ref 70–99)

## 2020-12-24 NOTE — Discharge Summary (Signed)
PATIENT ID:      [name]  MRN:     [id number] DOB/AGE:    1960/08/09 / 60 y.o.     DISCHARGE SUMMARY  ADMISSION DATE:    12/23/2020 DISCHARGE DATE:  12/24/2020  ADMISSION DIAGNOSIS: Displaced left proximal humerus fracture Past Medical History:  Diagnosis Date   Cancer ([place])    melanoma;  right ear   Depression    Diabetes mellitus    GERD (gastroesophageal reflux disease)    Hyperlipidemia    Hypertension    Metabolic syndrome    Obesity    PONV (postoperative nausea and vomiting)    YRS AGO WITHER ETHER    DISCHARGE DIAGNOSIS:   Active Problems:   S/P ORIF (open reduction internal fixation) fracture   PROCEDURE: Procedure(s): OPEN REDUCTION INTERNAL FIXATION (ORIF) PROXIMAL HUMERUS FRACTURE on 12/23/2020  CONSULTS:    HISTORY:  See H&P in chart.  HOSPITAL COURSE:  [name] is a 60 y.o. admitted on 12/23/2020 with a diagnosis of Displaced left proximal humerus fracture.  They were brought to the operating room on 12/23/2020 and underwent Procedure(s): OPEN REDUCTION INTERNAL FIXATION (ORIF) PROXIMAL HUMERUS FRACTURE.    They were given perioperative antibiotics:  Anti-infectives (From admission, onward)    Start     Dose/Rate Route Frequency Ordered Stop   12/23/20 1230  ceFAZolin (ANCEF) IVPB 3g/100 mL premix        3 g 200 mL/hr over 30 Minutes Intravenous On call to O.R. 12/23/20 1214 12/23/20 1559     .  Patient underwent the above named procedure and tolerated it well. The following day they were hemodynamically stable and pain was controlled on oral analgesics. They were neurovascularly intact to the operative extremity. OT was ordered and worked with patient per protocol. They were medically and orthopaedically stable for discharge on .    DIAGNOSTIC STUDIES:  RECENT RADIOGRAPHIC STUDIES :  DG Shoulder Left  Result Date: 12/23/2020 CLINICAL DATA:  ORIF femur fracture EXAM: LEFT SHOULDER - 2+ VIEW COMPARISON:  12/18/2020 FINDINGS: Dynamic plate  fixation of proximal humeral fracture. Multiple cortical screws enter the humeral head and humeral shaft. Glenoid humeral joint in tact. IMPRESSION: ORIF proximal humerus fracture. Electronically Signed   By: Suzy Bouchard M.D.   On: 12/23/2020 18:49   DG Shoulder Left  Result Date: 12/18/2020 CLINICAL DATA:  fall EXAM: LEFT SHOULDER - 2 VIEW; LEFT HUMERUS - 2 VIEW COMPARISON:  None. FINDINGS: Left shoulder, left humerus: Severely displaced comminuted fracture of the left humeral neck, with lateral displacement and foreshortening of the shaft. The humeral head also appears inferiorly displaced relative to the glenoid on the scapular Y-view. No other fractures identified. The distal humerus appears normal. IMPRESSION: Severely displaced and comminuted fracture of the left humeral neck as described. Electronically Signed   By: Albin Felling M.D.   On: 12/18/2020 11:52   DG Humerus Left  Result Date: 12/18/2020 CLINICAL DATA:  fall EXAM: LEFT SHOULDER - 2 VIEW; LEFT HUMERUS - 2 VIEW COMPARISON:  None. FINDINGS: Left shoulder, left humerus: Severely displaced comminuted fracture of the left humeral neck, with lateral displacement and foreshortening of the shaft. The humeral head also appears inferiorly displaced relative to the glenoid on the scapular Y-view. No other fractures identified. The distal humerus appears normal. IMPRESSION: Severely displaced and comminuted fracture of the left humeral neck as described. Electronically Signed   By: Albin Felling M.D.   On: 12/18/2020 11:52   DG C-Arm 1-60 Min-No  Report  Result Date: 12/23/2020 Fluoroscopy was utilized by the requesting physician.  No radiographic interpretation.    RECENT VITAL SIGNS:  Patient Vitals for the past 24 hrs:  BP Temp Temp src Pulse Resp SpO2 Height Weight  12/24/20 0620 (!) 150/72 98.8 F (37.1 C) Oral 96 18 99 % -- --  12/24/20 0145 (!) 153/68 98.7 F (37.1 C) Oral 89 18 96 % -- --  12/23/20 2301 (!) 189/81 98.9 F  (37.2 C) Oral 87 18 100 % -- --  12/23/20 2113 (!) 189/80 98.8 F (37.1 C) Oral 87 18 99 % -- --  12/23/20 2048 121/72 98.9 F (37.2 C) -- 90 20 96 % -- --  12/23/20 2015 (!) 152/67 98.6 F (37 C) -- 96 20 98 % -- --  12/23/20 2000 129/67 -- -- 88 (!) 22 100 % -- --  12/23/20 1945 131/66 -- -- 85 18 100 % -- --  12/23/20 1930 131/63 -- -- 84 20 100 % -- --  12/23/20 1915 129/67 -- -- 85 10 100 % -- --  12/23/20 1900 (!) 142/72 -- -- 86 (!) 21 96 % -- --  12/23/20 1845 (!) 141/70 -- -- 85 11 95 % -- --  12/23/20 1831 134/74 98.4 F (36.9 C) -- 88 (!) 23 94 % -- --  12/23/20 1449 -- -- -- 85 11 95 % -- --  12/23/20 1444 133/76 -- -- 86 16 94 % -- --  12/23/20 1439 138/74 -- -- 87 16 94 % -- --  12/23/20 1434 135/75 -- -- 86 15 95 % -- --  12/23/20 1429 138/75 -- -- 87 20 93 % -- --  12/23/20 1424 132/75 -- -- 87 17 94 % -- --  12/23/20 1419 117/75 -- -- 87 15 93 % -- --  12/23/20 1414 (!) 146/80 -- -- 88 19 93 % -- --  12/23/20 1411 (!) 142/79 -- -- 87 16 93 % -- --  12/23/20 1410 -- -- -- 86 15 92 % -- --  12/23/20 1409 -- -- -- 87 15 92 % -- --  12/23/20 1408 -- -- -- 87 16 93 % -- --  12/23/20 1407 -- -- -- 86 16 93 % -- --  12/23/20 1406 138/77 -- -- 88 14 (!) 89 % -- --  12/23/20 1405 -- -- -- 87 15 (!) 89 % -- --  12/23/20 1404 -- -- -- 85 11 94 % -- --  12/23/20 1403 -- -- -- 86 14 96 % -- --  12/23/20 1402 -- -- -- 87 13 97 % -- --  12/23/20 1401 128/80 -- -- 89 16 96 % -- --  12/23/20 1257 -- -- -- 88 13 96 % -- --  12/23/20 1256 -- -- -- 88 19 92 % -- --  12/23/20 1255 -- -- -- 88 19 93 % -- --  12/23/20 1254 -- -- -- 89 20 92 % -- --  12/23/20 1253 -- -- -- 88 -- 93 % -- --  12/23/20 1252 135/80 -- -- 88 -- 94 % -- --  12/23/20 1249 135/80 99.2 F (37.3 C) Oral 89 18 95 % 5\' 8"  (1.727 m) 127 kg  .  RECENT EKG RESULTS:    Orders placed or performed during the hospital encounter of 12/23/20   EKG 12 lead per protocol   EKG 12 lead per protocol    DISCHARGE  INSTRUCTIONS:  Discharge Instructions     Discharge patient  Complete by: As directed    Discharge disposition: 01-Home or Self Care   Discharge patient date: 12/23/2020       DISCHARGE MEDICATIONS:   Allergies as of 12/24/2020       Reactions   Codeine Nausea And Vomiting   Food Hives   Coconut flakes        Medication List     TAKE these medications    amLODipine-benazepril 5-40 MG capsule Commonly known as: LOTREL Take 1 capsule by mouth daily.   ARIPiprazole 5 MG tablet Commonly known as: ABILIFY TAKE 1 TABLET BY MOUTH  DAILY   cyclobenzaprine 10 MG tablet Commonly known as: FLEXERIL Take 1 tablet (10 mg total) by mouth 3 (three) times daily as needed for muscle spasms.   empagliflozin 10 MG Tabs tablet Commonly known as: Jardiance Take One 10 mg Tablet Daily.   FreeStyle Libre 14 Day Reader Kerrin Mo 2 each by Does not apply route daily.   FreeStyle Libre 14 Day Sensor Misc 2 each by Does not apply route daily.   glucose blood test strip Commonly known as: OneTouch Verio USE TO CHECK BLOOD SUGAR THREE TIMES A DAY AND PRN   HumuLIN 70/30 KwikPen (70-30) 100 UNIT/ML KwikPen Generic drug: insulin isophane & regular human KwikPen Inject 31 Units into the skin 2 (two) times daily with a meal. INJECT 30-50 UNITS SUBCUTANEOUSLY BEFORE BREAKFAST AND DINNER What changed:  how much to take how to take this when to take this additional instructions   lansoprazole 15 MG capsule Commonly known as: PREVACID Take 15 mg by mouth daily.   ondansetron 4 MG tablet Commonly known as: Zofran Take 1 tablet (4 mg total) by mouth every 8 (eight) hours as needed for nausea or vomiting.   ondansetron 8 MG disintegrating tablet Commonly known as: Zofran ODT Take 1 tablet (8 mg total) by mouth every 8 (eight) hours as needed for nausea or vomiting.   OneTouch Delica Lancets 06T Misc USE TO CHECK BLOOD SUGAR THREE TIMES A DAY AND PRN   OneTouch Verio Flex System  w/Device Kit 1 each by Does not apply route 3 (three) times daily.   oxyCODONE-acetaminophen 5-325 MG tablet Commonly known as: PERCOCET/ROXICET Take 2 tablets by mouth every 6 (six) hours as needed for up to 3 days for severe pain. What changed: Another medication with the same name was added. Make sure you understand how and when to take each.   oxyCODONE-acetaminophen 5-325 MG tablet Commonly known as: Percocet Take 1 tablet by mouth every 4 (four) hours as needed (max 6 q). What changed: You were already taking a medication with the same name, and this prescription was added. Make sure you understand how and when to take each.   rosuvastatin 10 MG tablet Commonly known as: CRESTOR Take 1 tablet (10 mg total) by mouth daily.        FOLLOW UP VISIT:    Follow-up Information     Justice Britain, MD Follow up.   Specialty: Orthopedic Surgery Why: 01-05-2021 at 10:00 AM for -post-op Contact information: [address] [place] [address] [phone]                 DISCHARGE TO: Home   DISCHARGE CONDITION:  [name] for Dr. Justice Britain 12/24/2020, 8:17 AM

## 2020-12-24 NOTE — Evaluation (Signed)
Occupational Therapy Evaluation Patient Details Name: [name] MRN: [id number] DOB: Sep 15, 1960 Today's Date: 12/24/2020   History of Present Illness Patient is a 60 year old male that fell resulting in severely displaced left proximal humerus fracture. Pt s/p ORIF L humerus on 9/27. PMH includes DM, GERD, Metabolic syndrome   Clinical Impression   Patient is a 60 year old male s/p shoulder surgery without functional use of left non-dominant upper extremity secondary to effects of surgery, interscalene block and shoulder precautions. Therapist provided education and instruction to patient in regards to exercises, precautions, positioning, donning upper extremity clothing and bathing while maintaining shoulder precautions, ice and edema management and donning/doffing sling. Patient verbalized understanding and demonstrated as needed. Patient needed assistance to donn shirt, underwear, pants, socks and shoes and provided with instruction on compensatory strategies to perform ADLs. Patient to follow up with MD for further therapy needs.        Recommendations for follow up therapy are one component of a multi-disciplinary discharge planning process, led by the attending physician.  Recommendations may be updated based on patient status, additional functional criteria and insurance authorization.   Follow Up Recommendations  Follow surgeon's recommendation for DC plan and follow-up therapies    Equipment Recommendations  None recommended by OT       Precautions / Restrictions Precautions Precautions: Shoulder Type of Shoulder Precautions: PROM 10 ER, 45 ABD,60 FE , PASSIVE ROM FOR ADL's ONLY, NOT for EXERCISES. OK to exercise elbow wrist and hand rom and for edema control   No pendulums, may allow arm to dangle   Pt may shower Shoulder Interventions: Shoulder sling/immobilizer;Off for dressing/bathing/exercises Precaution Booklet Issued: Yes (comment) Required Braces or Orthoses:  Sling Restrictions Weight Bearing Restrictions: Yes LUE Weight Bearing: Non weight bearing      Mobility Bed Mobility Overal bed mobility: Modified Independent             General bed mobility comments: increased time to sit upright    Transfers Overall transfer level: Independent                    Balance Overall balance assessment: Independent                                         ADL either performed or assessed with clinical judgement   ADL Overall ADL's : Needs assistance/impaired Eating/Feeding: Independent   Grooming: Wash/dry hands;Independent;Standing   Upper Body Bathing: Minimal assistance;Sitting;Standing   Lower Body Bathing: Independent   Upper Body Dressing : Moderate assistance;Sitting;Standing Upper Body Dressing Details (indicate cue type and reason): assist to thread L UE into sleeve and to pull shirt down in back Lower Body Dressing: Independent;Sitting/lateral leans;Sit to/from stand Lower Body Dressing Details (indicate cue type and reason): able to don pajama pants without assistance Toilet Transfer: Independent;Ambulation   Toileting- Clothing Manipulation and Hygiene: Independent;Sit to/from stand       Functional mobility during ADLs: Independent General ADL Comments: patient educated in compensatory strategies for self care tasks in order to maintain shoulder precautions                         Pertinent Vitals/Pain  Faces Hurts a little bit L UE Heaviness, Numbness Monitored during session     Hand Dominance Right   Extremity/Trunk Assessment Upper Extremity Assessment Upper Extremity Assessment: LUE  deficits/detail LUE Deficits / Details: + nerve block   Lower Extremity Assessment Lower Extremity Assessment: Overall WFL for tasks assessed   Cervical / Trunk Assessment Cervical / Trunk Assessment: Normal   Communication Communication Communication: No difficulties   Cognition  Arousal/Alertness: Awake/alert Behavior During Therapy: WFL for tasks assessed/performed Overall Cognitive Status: Within Functional Limits for tasks assessed                                        Exercises Exercises: Shoulder   Shoulder Instructions Shoulder Instructions Donning/doffing shirt without moving shoulder: Patient able to independently direct caregiver;Moderate assistance Method for sponge bathing under operated UE: Minimal assistance;Patient able to independently direct caregiver Donning/doffing sling/immobilizer: Moderate assistance;Patient able to independently direct caregiver Correct positioning of sling/immobilizer: Independent;Patient able to independently direct caregiver Pendulum exercises (written home exercise program):  (N/A) ROM for elbow, wrist and digits of operated UE: Independent;Patient able to independently direct caregiver Sling wearing schedule (on at all times/off for ADL's): Independent;Patient able to independently direct caregiver Proper positioning of operated UE when showering: Independent;Patient able to independently direct caregiver Dressing change:  (N/A) Positioning of UE while sleeping: Independent;Patient able to independently direct caregiver    Home Living Family/patient expects to be discharged to:: Private residence Living Arrangements: Alone Available Help at Discharge: Friend(s);Family Type of Home: Other(Comment) (condo) Home Access: Stairs to enter Technical brewer of Steps: 1   Home Layout: Two level Alternate Level Stairs-Number of Steps: 13 Alternate Level Stairs-Rails: Right Bathroom Shower/Tub: Occupational psychologist: Standard     Home Equipment: None          Prior Functioning/Environment Level of Independence: Independent                 OT Problem List: Pain;Impaired UE functional use;Decreased knowledge of precautions         OT Goals(Current goals can be found in the  care plan section) Acute Rehab OT Goals Patient Stated Goal: not fall again OT Goal Formulation: All assessment and education complete, DC therapy      AM-PAC OT "6 Clicks" Daily Activity     Outcome Measure Help from another person eating meals?: None Help from another person taking care of personal grooming?: None Help from another person toileting, which includes using toliet, bedpan, or urinal?: None Help from another person bathing (including washing, rinsing, drying)?: A Little Help from another person to put on and taking off regular upper body clothing?: A Lot Help from another person to put on and taking off regular lower body clothing?: None 6 Click Score: 21   End of Session Equipment Utilized During Treatment: Other (comment) (sling) Nurse Communication: Other (comment) (OT complete)  Activity Tolerance: Patient tolerated treatment well Patient left: in chair;with call bell/phone within reach  OT Visit Diagnosis: Pain;History of falling (Z91.81) Pain - Right/Left: Left Pain - part of body: Shoulder                Time: 1443-1540 OT Time Calculation (min): 35 min Charges:  OT General Charges $OT Visit: 1 Visit OT Evaluation $OT Eval Low Complexity: 1 Low OT Treatments $Self Care/Home Management : 8-22 mins  Delbert Phenix OT OT pager: [place] 12/24/2020, 10:34 AM

## 2020-12-24 NOTE — TOC Transition Note (Signed)
Transition of Care [facility]) - CM/SW Discharge Note  Patient Details  Name: [name] MRN: [id number] Date of Birth: 06-17-1960  [facility]) CM/SW Contact:  Sherie Don, LCSW Phone Number: 12/24/2020, 11:11 AM  Clinical Narrative: [facility] consulted for HH/DME needs. CSW met with patient to discuss needs. Per patient, he did not need HH and already has the DME he will need. TOC signing off.  Final next level of care: Home/Self Care Barriers to Discharge: No Barriers Identified  Patient Goals and CMS Choice Patient states their goals for this hospitalization and ongoing recovery are:: Discharge home CMS Medicare.gov Compare Post Acute Care list provided to:: Patient Choice offered to / list presented to : NA  Discharge Plan and Services       DME Arranged: N/A HH Arranged: NA  Readmission Risk Interventions No flowsheet data found.

## 2020-12-25 ENCOUNTER — Other Ambulatory Visit: Payer: Self-pay | Admitting: Family Medicine

## 2020-12-25 LAB — HEMOGLOBIN A1C
Hgb A1c MFr Bld: 11.6 % — ABNORMAL HIGH (ref 4.8–5.6)
Mean Plasma Glucose: 286 mg/dL

## 2021-01-05 DIAGNOSIS — Z4789 Encounter for other orthopedic aftercare: Secondary | ICD-10-CM | POA: Diagnosis not present

## 2021-02-16 DIAGNOSIS — Z4789 Encounter for other orthopedic aftercare: Secondary | ICD-10-CM | POA: Diagnosis not present

## 2021-04-03 ENCOUNTER — Telehealth: Payer: Self-pay | Admitting: Family Medicine

## 2021-04-03 MED ORDER — HUMULIN 70/30 KWIKPEN (70-30) 100 UNIT/ML ~~LOC~~ SUPN: PEN_INJECTOR | SUBCUTANEOUS | 11 refills | Status: DC

## 2021-04-03 NOTE — Telephone Encounter (Signed)
Pt called and said his pharmacy said they had sent over a refill request for insulin isophane & regular human KwikPen (HUMULIN 70/30 KWIKPEN) (70-30) 100 UNIT/ML KwikPen but havent heard anything back yet. Please advise.  [facility] mail order pharmacy

## 2021-04-03 NOTE — Telephone Encounter (Signed)
Refill sent to [facility].  

## 2021-04-07 NOTE — Telephone Encounter (Signed)
Please resend with only the following signature "INJECT 30-50 UNITS SUBCUTANEOUSLY BEFORE BREAKFAST AND DINNER" -remove the 31 units twice daily portion.

## 2021-04-07 NOTE — Telephone Encounter (Signed)
I called and spoke with the pharmacist at [facility] and they stated there ar 2 Sig instructions for taking the medication Humulin 70/30  and they want to know which one should the patient be taking, please advise.  I will call them back at [phone] Reference # [id number].  Clemence Lengyel,cma

## 2021-04-07 NOTE — Telephone Encounter (Signed)
Patient has called back in  stating that [facility] is requesting our office to give them a call with more information in regard to the script.   Please follow back up patient in regard.

## 2021-04-08 MED ORDER — HUMULIN 70/30 KWIKPEN (70-30) 100 UNIT/ML ~~LOC~~ SUPN: PEN_INJECTOR | SUBCUTANEOUS | 11 refills | Status: DC

## 2021-04-08 NOTE — Telephone Encounter (Signed)
Rx resent to [facility].

## 2021-04-08 NOTE — Addendum Note (Signed)
Addended by: Clyde Lundborg A on: 04/08/2021 08:27 AM   Modules accepted: Orders

## 2021-04-27 NOTE — Progress Notes (Signed)
° °Phone [phone] °In person visit °  °Subjective:  ° °[name] is a 60 y.o. year old very pleasant male patient who presents for/with See problem oriented charting °Chief Complaint  °Patient presents with  ° Follow-up  °  Pt is here fo surgery clearance.   ° °This visit occurred during the SARS-CoV-2 public health emergency.  Safety protocols were in place, including screening questions prior to the visit, additional usage of staff PPE, and extensive cleaning of exam room while observing appropriate contact time as indicated for disinfecting solutions.  ° °Past Medical History-  °Patient Active Problem List  ° Diagnosis Date Noted  ° Morbid obesity (HCC) 12/13/2008  °  Priority: High  ° Type 2 diabetes mellitus (HCC) 03/05/2008  °  Priority: High  ° History of melanoma. R ear.  12/16/2006  °  Priority: High  ° Hypertension associated with diabetes (HCC) 03/04/2015  °  Priority: Medium   ° GERD (gastroesophageal reflux disease) 03/04/2015  °  Priority: Medium   ° Major depression, recurrent, full remission (HCC) 12/16/2006  °  Priority: Medium   ° Hyperlipidemia associated with type 2 diabetes mellitus (HCC) 11/07/2006  °  Priority: Medium   ° Eczema 12/02/2015  °  Priority: Low  ° S/P ORIF (open reduction internal fixation) fracture 12/23/2020  ° ° °Medications- reviewed and updated °Current Outpatient Medications  °Medication Sig Dispense Refill  ° amLODipine-benazepril (LOTREL) 5-40 MG capsule TAKE 1 CAPSULE BY MOUTH  DAILY 90 capsule 3  ° ARIPiprazole (ABILIFY) 5 MG tablet TAKE 1 TABLET BY MOUTH  DAILY 30 tablet 11  ° Blood Glucose Monitoring Suppl (ONETOUCH VERIO FLEX SYSTEM) w/Device KIT 1 each by Does not apply route 3 (three) times daily. 1 kit 0  ° Continuous Blood Gluc Receiver (FREESTYLE LIBRE 14 DAY READER) DEVI 2 each by Does not apply route daily. 2 each 11  ° Continuous Blood Gluc Sensor (FREESTYLE LIBRE 14 DAY SENSOR) MISC 2 each by Does not apply route daily. 2 each 11  ° cyclobenzaprine  (FLEXERIL) 10 MG tablet Take 1 tablet (10 mg total) by mouth 3 (three) times daily as needed for muscle spasms. 30 tablet 1  ° glucose blood (ONETOUCH VERIO) test strip USE TO CHECK BLOOD SUGAR THREE TIMES A DAY AND PRN 100 each 5  ° insulin isophane & regular human KwikPen (HUMULIN 70/30 KWIKPEN) (70-30) 100 UNIT/ML KwikPen INJECT 30-50 UNITS SUBCUTANEOUSLY BEFORE BREAKFAST AND DINNER 15 mL 11  ° JARDIANCE 10 MG TABS tablet TAKE 1 TABLET BY MOUTH  DAILY 90 tablet 3  ° lansoprazole (PREVACID) 15 MG capsule Take 15 mg by mouth daily.    ° ondansetron (ZOFRAN ODT) 8 MG disintegrating tablet Take 1 tablet (8 mg total) by mouth every 8 (eight) hours as needed for nausea or vomiting. 20 tablet 0  ° ondansetron (ZOFRAN) 4 MG tablet Take 1 tablet (4 mg total) by mouth every 8 (eight) hours as needed for nausea or vomiting. 10 tablet 0  ° ONETOUCH DELICA LANCETS 33G MISC USE TO CHECK BLOOD SUGAR THREE TIMES A DAY AND PRN 100 each 5  ° oxyCODONE-acetaminophen (PERCOCET) 5-325 MG tablet Take 1 tablet by mouth every 4 (four) hours as needed (max 6 q). 20 tablet 0  ° rosuvastatin (CRESTOR) 10 MG tablet TAKE 1 TABLET BY MOUTH  DAILY 90 tablet 3  ° SitaGLIPtin-MetFORMIN HCl (JANUMET XR) 50-1000 MG TB24 Take 1 tablet by mouth 2 (two) times daily. 180 tablet 3  ° °No current   facility-administered medications for this visit.  ° °  °Objective:  °BP 138/78    Pulse 82    Temp 97.9 °F (36.6 °C)    Ht 5' 8" (1.727 m)    Wt 268 lb 12.8 oz (121.9 kg)    SpO2 96%    BMI 40.87 kg/m²  °Gen: NAD, resting comfortably °CV: RRR no murmurs rubs or gallops °Lungs: CTAB no crackles, wheeze, rhonchi °Abdomen: soft/nontender/nondistended/normal bowel sounds. No rebound or guarding.  °Ext: no edema °Skin: warm, dry °Neuro: grossly normal, moves all extremities °Msk: let arm in sling ° °  ° °Assessment and Plan  ° ° °#Surgical clearance- planned for general anesthesia to remove hardware after prior left proximal humerus fracture  (5/10 pain most of the  time) that occurred 12/23/20 after a fall that occurred several days prior  on 12/18/20 down multiple stairs (he has cut back on alcohol since that time). Able to complete 4 mets of activity- briskly go up stairs without chest pain or shortness of breath- would not need further cardiac clearance.  °- not having to take pain medicine at moment ° °#Diabetes/morbid obesity °S:Patient using 70/30 insulin typically 42 units AM, 32 units PM  °-adding jardiance 07/15/20- he Is tolerating this.  °- janumet- has some at home but not taking- he thought he was to stop when starting jardiance °- liraglutide/victoza was expensive  °CBGs- sugars around 200 in the morning, 180 in the evening.  °-notes frequent urination °Exercise and diet- doing some walking- walking  °Lab Results  °Component Value Date  ° HGBA1C 11.4 (A) 05/04/2021  ° HGBA1C 11.6 (H) 12/23/2020  ° HGBA1C 10.8 (H) 07/15/2020  ° A/P: poor control of diabetes. Advised to increase to 43 units in Am and 33 in PM for 70/30 insulin and restart janumet (for 1 week take 1 in AM then can take twice daily) but continue jardiance °-refer to endocrine to see if we can get sugars better more quickly and get cleared for surgery °-update me in 2 weeks with home readings as I may tweak insulin higher as well.  °- bmi over 40- but has lost 12 lbs since last visit- some could be from high sugars °-congratulate don cutting down to 4 beverages a week- good for wieght, diabetes, and fall prevention ° °#Hyperlipidemia °S: Compliant with rosuvastatin 10 mg daily °Lab Results  °Component Value Date  ° CHOL 150 03/06/2020  ° HDL 37 (L) 03/06/2020  ° LDLCALC 79 03/06/2020  ° LDLDIRECT 58.0 08/04/2017  ° TRIG 260 (H) 03/06/2020  ° CHOLHDL 4.1 03/06/2020  ° A/P: slightly high on last check- due for repeat ° °#Hypertension °S:Compliant with amlodipine benazepril 5-40mg daily °Home readings #s: does not check °BP Readings from Last 3 Encounters:  °05/04/21 138/78  °12/24/20 124/74  °12/21/20  120/68  °A/P: reasonable control- continue current meds ° °#GERD °S: Compliant with otc omeprazole.  °-B12 had been low normal so I recommended at least 250 mcg B12 daily  °A/P:  Controlled. Continue current medications.  ° ° ° °#Depression-follows with psychiatry Dr. Kaur °S: Remains in full remission on Abilify 5mg and vibryyd (was able to overcome cost barrier with generic). No suicidal ideation.  °He had to come off of pristiq 50mg, trintellix °Depression screen PHQ 2/9 05/04/2021 11/20/2020 07/15/2020  °Decreased Interest 0 1 1  °Down, Depressed, Hopeless 1 2 1  °PHQ - 2 Score 1 3 2  °Altered sleeping 0 0 0  °Tired, decreased energy 0 0 1  °  Change in appetite 0 0 1  Feeling bad or failure about yourself  _0 Trouble concentrating 0 0 1  Moving slowly or fidgety/restless 0 0 0  Suicidal thoughts 0 1 1  PHQ-9 Score _1 Difficult doing work/chores Not difficult at all Somewhat difficult Somewhat difficult  Some recent data might be hidden  A/P: reports reasonable control/full remission- continue psychiatry follow up  - continue current meds  # Mild LFt elevations- likely fatty liver- monitor - was better on last check Lab Results  Component Value Date   ALT 31 07/15/2020   AST 30 07/15/2020   ALKPHOS 54 07/15/2020   BILITOT 0.4 07/15/2020   Recommended follow up: Return in about 4 months (around 09/01/2021) for follow up- or sooner if needed.  Lab/Order associations:   ICD-10-CM   1. Type 2 diabetes mellitus without complication, with long-term current use of insulin (HCC)  E11.9 POCT HgB A1C   Z79.4 Ambulatory referral to Endocrinology    2. Hyperlipidemia associated with type 2 diabetes mellitus ([place])  E11.69    E78.5     3. Primary hypertension  I10     4. Gastroesophageal reflux disease without esophagitis  K21.9     5. Major depression, recurrent, full remission ([place])  F33.42     6. Morbid obesity (HCC) Chronic E66.01       Meds ordered this encounter  Medications    SitaGLIPtin-MetFORMIN HCl (JANUMET XR) 50-1000 MG TB24    Sig: Take 1 tablet by mouth 2 (two) times daily.    Dispense:  180 tablet    Refill:  3   I,Jada Bradford,acting as a scribe for Garret Reddish, MD.,have documented all relevant documentation on the behalf of Garret Reddish, MD,as directed by  Garret Reddish, MD while in the presence of Garret Reddish, MD.  I, Garret Reddish, MD, have reviewed all documentation for this visit. The documentation on 05/04/21 for the exam, diagnosis, procedures, and orders are all accurate and complete.  Return precautions advised.  Garret Reddish, MD

## 2021-04-28 ENCOUNTER — Other Ambulatory Visit: Payer: Self-pay | Admitting: Family Medicine

## 2021-04-29 ENCOUNTER — Telehealth: Payer: Self-pay

## 2021-04-29 NOTE — Telephone Encounter (Signed)
Calling in regard to surgery clear and also needing an up to date A1C.   Will fax again.   Did in form her that patient had an appt scheduled for 2/6 but not sure it it was for this surgery clear.  Please follow up in regard.

## 2021-04-29 NOTE — Telephone Encounter (Signed)
Called and lm on [name] letting her know that pt has an appointment for surgery clearance and updated a1c on 05/04/21 and once Dr. Yong Channel signs the note and his a1c comes back I will fax everything over.

## 2021-05-04 ENCOUNTER — Encounter: Payer: Self-pay | Admitting: Family Medicine

## 2021-05-04 ENCOUNTER — Other Ambulatory Visit: Payer: Self-pay

## 2021-05-04 ENCOUNTER — Ambulatory Visit (INDEPENDENT_AMBULATORY_CARE_PROVIDER_SITE_OTHER): Payer: 59 | Admitting: Family Medicine

## 2021-05-04 VITALS — BP 138/78 | HR 82 | Temp 97.9°F | Ht 68.0 in | Wt 268.8 lb

## 2021-05-04 DIAGNOSIS — Z794 Long term (current) use of insulin: Secondary | ICD-10-CM | POA: Diagnosis not present

## 2021-05-04 DIAGNOSIS — K219 Gastro-esophageal reflux disease without esophagitis: Secondary | ICD-10-CM | POA: Diagnosis not present

## 2021-05-04 DIAGNOSIS — E785 Hyperlipidemia, unspecified: Secondary | ICD-10-CM

## 2021-05-04 DIAGNOSIS — I10 Essential (primary) hypertension: Secondary | ICD-10-CM

## 2021-05-04 DIAGNOSIS — E1169 Type 2 diabetes mellitus with other specified complication: Secondary | ICD-10-CM

## 2021-05-04 DIAGNOSIS — E119 Type 2 diabetes mellitus without complications: Secondary | ICD-10-CM | POA: Diagnosis not present

## 2021-05-04 DIAGNOSIS — F3342 Major depressive disorder, recurrent, in full remission: Secondary | ICD-10-CM

## 2021-05-04 LAB — POCT GLYCOSYLATED HEMOGLOBIN (HGB A1C): Hemoglobin A1C: 11.4 % — AB (ref 4.0–5.6)

## 2021-05-04 MED ORDER — JANUMET XR 50-1000 MG PO TB24: 1.0000 | ORAL_TABLET | Freq: Two times a day (BID) | ORAL | 3 refills | Status: DC

## 2021-05-04 NOTE — Addendum Note (Signed)
Addended by: Marin Olp on: 05/04/2021 01:57 PM   Modules accepted: Orders

## 2021-05-04 NOTE — Patient Instructions (Addendum)
poor control of diabetes. Advised to increase to 43 units in Am and 33 in PM for 70/30 insulin and restart janumet (for 1 week take 1 in AM then can take twice daily) but continue jardiance -refer to endocrine to see if we can get sugars better more quickly and get cleared for surgery -update me in 2 weeks with home readings as I may tweak insulin higher as well.  Schedule a lab visit at the check out desk within 2 weeks. Return for future fasting labs meaning nothing but water after midnight please. Ok to take your medications with water.   Recommended follow up: Return in about 4 months (around 09/01/2021) for follow up- or sooner if needed.

## 2021-05-18 ENCOUNTER — Other Ambulatory Visit (INDEPENDENT_AMBULATORY_CARE_PROVIDER_SITE_OTHER): Payer: 59

## 2021-05-18 ENCOUNTER — Other Ambulatory Visit: Payer: Self-pay

## 2021-05-18 DIAGNOSIS — E1169 Type 2 diabetes mellitus with other specified complication: Secondary | ICD-10-CM | POA: Diagnosis not present

## 2021-05-18 DIAGNOSIS — Z794 Long term (current) use of insulin: Secondary | ICD-10-CM | POA: Diagnosis not present

## 2021-05-18 DIAGNOSIS — Z125 Encounter for screening for malignant neoplasm of prostate: Secondary | ICD-10-CM

## 2021-05-18 DIAGNOSIS — E119 Type 2 diabetes mellitus without complications: Secondary | ICD-10-CM | POA: Diagnosis not present

## 2021-05-18 DIAGNOSIS — E785 Hyperlipidemia, unspecified: Secondary | ICD-10-CM | POA: Diagnosis not present

## 2021-05-18 LAB — CBC WITH DIFFERENTIAL/PLATELET
Basophils Absolute: 0 10*3/uL (ref 0.0–0.1)
Basophils Relative: 0.6 % (ref 0.0–3.0)
Eosinophils Absolute: 0.6 10*3/uL (ref 0.0–0.7)
Eosinophils Relative: 8.7 % — ABNORMAL HIGH (ref 0.0–5.0)
HCT: 42.9 % (ref 39.0–52.0)
Hemoglobin: 14 g/dL (ref 13.0–17.0)
Lymphocytes Relative: 26 % (ref 12.0–46.0)
Lymphs Abs: 1.8 10*3/uL (ref 0.7–4.0)
MCHC: 32.7 g/dL (ref 30.0–36.0)
MCV: 87.6 fl (ref 78.0–100.0)
Monocytes Absolute: 0.4 10*3/uL (ref 0.1–1.0)
Monocytes Relative: 6.1 % (ref 3.0–12.0)
Neutro Abs: 4.1 10*3/uL (ref 1.4–7.7)
Neutrophils Relative %: 58.6 % (ref 43.0–77.0)
Platelets: 228 10*3/uL (ref 150.0–400.0)
RBC: 4.9 Mil/uL (ref 4.22–5.81)
RDW: 14.2 % (ref 11.5–15.5)
WBC: 7.1 10*3/uL (ref 4.0–10.5)

## 2021-05-18 LAB — COMPREHENSIVE METABOLIC PANEL
ALT: 19 U/L (ref 0–53)
AST: 15 U/L (ref 0–37)
Albumin: 4.2 g/dL (ref 3.5–5.2)
Alkaline Phosphatase: 59 U/L (ref 39–117)
BUN: 13 mg/dL (ref 6–23)
CO2: 29 mEq/L (ref 19–32)
Calcium: 9.1 mg/dL (ref 8.4–10.5)
Chloride: 102 mEq/L (ref 96–112)
Creatinine, Ser: 1.07 mg/dL (ref 0.40–1.50)
GFR: 74.92 mL/min (ref >60.00)
Glucose, Bld: 252 mg/dL — ABNORMAL HIGH (ref 70–99)
Potassium: 4.5 mEq/L (ref 3.5–5.1)
Sodium: 138 mEq/L (ref 135–145)
Total Bilirubin: 0.4 mg/dL (ref 0.2–1.2)
Total Protein: 6.8 g/dL (ref 6.0–8.3)

## 2021-05-18 LAB — LIPID PANEL
Cholesterol: 186 mg/dL (ref 0–200)
HDL: 59.5 mg/dL (ref >39.00)
NonHDL: 126.05
Total CHOL/HDL Ratio: 3
Triglycerides: 279 mg/dL — ABNORMAL HIGH (ref 0.0–149.0)
VLDL: 55.8 mg/dL — ABNORMAL HIGH (ref 0.0–40.0)

## 2021-05-18 LAB — PSA: PSA: 0.45 ng/mL (ref 0.10–4.00)

## 2021-05-18 LAB — LDL CHOLESTEROL, DIRECT: Direct LDL: 97 mg/dL

## 2021-05-22 ENCOUNTER — Other Ambulatory Visit: Payer: Self-pay

## 2021-05-22 MED ORDER — ROSUVASTATIN CALCIUM 20 MG PO TABS: 20.0000 mg | ORAL_TABLET | Freq: Every day | ORAL | 3 refills | Status: DC

## 2021-06-18 ENCOUNTER — Ambulatory Visit: Payer: 59 | Admitting: Family Medicine

## 2021-09-02 ENCOUNTER — Ambulatory Visit: Payer: 59 | Admitting: Family Medicine

## 2021-09-02 DIAGNOSIS — E785 Hyperlipidemia, unspecified: Secondary | ICD-10-CM | POA: Insufficient documentation

## 2021-09-03 ENCOUNTER — Encounter: Payer: Self-pay | Admitting: Family Medicine

## 2022-02-17 ENCOUNTER — Encounter: Payer: Self-pay | Admitting: Gastroenterology

## 2022-09-27 DIAGNOSIS — F4312 Post-traumatic stress disorder, chronic: Secondary | ICD-10-CM | POA: Insufficient documentation

## 2022-09-27 DIAGNOSIS — F1210 Cannabis abuse, uncomplicated: Secondary | ICD-10-CM | POA: Insufficient documentation

## 2022-10-03 ENCOUNTER — Other Ambulatory Visit: Payer: Self-pay | Admitting: Family Medicine

## 2023-02-08 ENCOUNTER — Encounter: Payer: Self-pay | Admitting: Family Medicine

## 2023-02-09 ENCOUNTER — Other Ambulatory Visit: Payer: Self-pay | Admitting: Family Medicine

## 2023-02-09 MED ORDER — HUMULIN 70/30 KWIKPEN (70-30) 100 UNIT/ML ~~LOC~~ SUPN: PEN_INJECTOR | SUBCUTANEOUS | 11 refills | Status: DC

## 2023-02-22 ENCOUNTER — Ambulatory Visit (HOSPITAL_COMMUNITY)
Admission: EM | Admit: 2023-02-22 | Discharge: 2023-02-23 | Disposition: A | Discharge status: Home / Self Care | Payer: 59 | Attending: Psychiatry | Admitting: Psychiatry

## 2023-02-22 DIAGNOSIS — F332 Major depressive disorder, recurrent severe without psychotic features: Secondary | ICD-10-CM | POA: Insufficient documentation

## 2023-02-22 DIAGNOSIS — R9431 Abnormal electrocardiogram [ECG] [EKG]: Secondary | ICD-10-CM | POA: Diagnosis not present

## 2023-02-22 DIAGNOSIS — F411 Generalized anxiety disorder: Secondary | ICD-10-CM | POA: Diagnosis not present

## 2023-02-22 DIAGNOSIS — Z9151 Personal history of suicidal behavior: Secondary | ICD-10-CM | POA: Insufficient documentation

## 2023-02-22 DIAGNOSIS — R45851 Suicidal ideations: Secondary | ICD-10-CM | POA: Diagnosis not present

## 2023-02-22 DIAGNOSIS — Z818 Family history of other mental and behavioral disorders: Secondary | ICD-10-CM | POA: Diagnosis not present

## 2023-02-22 LAB — CBC WITH DIFFERENTIAL/PLATELET
Abs Immature Granulocytes: 0.01 10*3/uL (ref 0.00–0.07)
Basophils Absolute: 0 10*3/uL (ref 0.0–0.1)
Basophils Relative: 0 %
Eosinophils Absolute: 0.1 10*3/uL (ref 0.0–0.5)
Eosinophils Relative: 1 %
HCT: 49.6 % (ref 39.0–52.0)
Hemoglobin: 16 g/dL (ref 13.0–17.0)
Immature Granulocytes: 0 %
Lymphocytes Relative: 22 %
Lymphs Abs: 1.8 10*3/uL (ref 0.7–4.0)
MCH: 27.4 pg (ref 26.0–34.0)
MCHC: 32.3 g/dL (ref 30.0–36.0)
MCV: 84.9 fL (ref 80.0–100.0)
Monocytes Absolute: 0.4 10*3/uL (ref 0.1–1.0)
Monocytes Relative: 5 %
Neutro Abs: 6.2 10*3/uL (ref 1.7–7.7)
Neutrophils Relative %: 72 %
Platelets: 318 10*3/uL (ref 150–400)
RBC: 5.84 MIL/uL — ABNORMAL HIGH (ref 4.22–5.81)
RDW: 13.1 % (ref 11.5–15.5)
WBC: 8.5 10*3/uL (ref 4.0–10.5)
nRBC: 0 % (ref 0.0–0.2)

## 2023-02-22 LAB — COMPREHENSIVE METABOLIC PANEL
ALT: 24 U/L (ref 0–44)
AST: 23 U/L (ref 15–41)
Albumin: 4.4 g/dL (ref 3.5–5.0)
Alkaline Phosphatase: 52 U/L (ref 38–126)
Anion gap: 8 (ref 5–15)
BUN: 9 mg/dL (ref 8–23)
CO2: 30 mmol/L (ref 22–32)
Calcium: 9.8 mg/dL (ref 8.9–10.3)
Chloride: 99 mmol/L (ref 98–111)
Creatinine, Ser: 0.84 mg/dL (ref 0.61–1.24)
GFR, Estimated: >60 mL/min (ref >60)
Glucose, Bld: 109 mg/dL — ABNORMAL HIGH (ref 70–99)
Potassium: 4.1 mmol/L (ref 3.5–5.1)
Sodium: 137 mmol/L (ref 135–145)
Total Bilirubin: 0.6 mg/dL (ref <1.2)
Total Protein: 7.5 g/dL (ref 6.5–8.1)

## 2023-02-22 LAB — POCT URINE DRUG SCREEN - MANUAL ENTRY (I-SCREEN)
POC Amphetamine UR: NOT DETECTED
POC Buprenorphine (BUP): NOT DETECTED
POC Cocaine UR: NOT DETECTED
POC Marijuana UR: POSITIVE — AB
POC Methadone UR: NOT DETECTED
POC Methamphetamine UR: NOT DETECTED
POC Morphine: NOT DETECTED
POC Oxazepam (BZO): NOT DETECTED
POC Oxycodone UR: NOT DETECTED
POC Secobarbital (BAR): NOT DETECTED

## 2023-02-22 LAB — ETHANOL: Alcohol, Ethyl (B): <10 mg/dL (ref <10)

## 2023-02-22 LAB — HEMOGLOBIN A1C
Hgb A1c MFr Bld: 11.6 % — ABNORMAL HIGH (ref 4.8–5.6)
Mean Plasma Glucose: 286.22 mg/dL

## 2023-02-22 LAB — TSH: TSH: 1.339 u[IU]/mL (ref 0.350–4.500)

## 2023-02-22 MED ORDER — ALUM & MAG HYDROXIDE-SIMETH 200-200-20 MG/5ML PO SUSP: 30.0000 mL | ORAL | Status: DC | PRN

## 2023-02-22 MED ORDER — HYDROXYZINE HCL 25 MG PO TABS
25.0000 mg | ORAL_TABLET | Freq: Three times a day (TID) | ORAL | Status: DC | PRN
  Administered 2023-02-22: 25 mg via ORAL
  Filled 2023-02-22: qty 1

## 2023-02-22 MED ORDER — ACETAMINOPHEN 325 MG PO TABS: 650.0000 mg | ORAL_TABLET | Freq: Four times a day (QID) | ORAL | Status: DC | PRN

## 2023-02-22 MED ORDER — MAGNESIUM HYDROXIDE 400 MG/5ML PO SUSP: 30.0000 mL | Freq: Every day | ORAL | Status: DC | PRN

## 2023-02-22 MED ORDER — TRAZODONE HCL 50 MG PO TABS
50.0000 mg | ORAL_TABLET | Freq: Every evening | ORAL | Status: DC | PRN
  Administered 2023-02-22: 50 mg via ORAL
  Filled 2023-02-22: qty 1

## 2023-02-22 NOTE — ED Notes (Signed)
Pt admitted to obs  endorsing SI/ denies HI/AVH. Calm, cooperative throughout interview process. Skin assessment completed. Oriented to unit. Meal and drink offered. At currrent, pt continue to endorse SI/ denies HI/AVH. Pt could not contract for safety. Will monitor for safety.

## 2023-02-22 NOTE — ED Notes (Signed)
Pt sleeping at present, no distress noted.  Monitoring for safety. 

## 2023-02-22 NOTE — ED Notes (Signed)
Patient in milieu. Environment is secured. Will continue to monitor for safety. 
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
## 2023-02-22 NOTE — ED Notes (Signed)
Pt A&O x 4, sleeping at present, no distress noted. Monitoring for safety.  Remains SI, unable to contract for safety.

## 2023-02-23 DIAGNOSIS — F411 Generalized anxiety disorder: Secondary | ICD-10-CM | POA: Diagnosis not present

## 2023-02-23 DIAGNOSIS — F332 Major depressive disorder, recurrent severe without psychotic features: Secondary | ICD-10-CM | POA: Diagnosis not present

## 2023-02-23 NOTE — ED Notes (Signed)
Pt ambulatory to bathroom x 1, gait steady, no distress noted.  Monitoring for safety.  Calm & cooperative.

## 2023-02-23 NOTE — ED Notes (Signed)
Patient was provided breakfast

## 2023-02-23 NOTE — ED Notes (Signed)
Patient alert and oriented x 3. Denies SI/HI/AVH. Denies intent or plan to harm self or others. Routine conducted according to faculty protocol. Encourage patient to notify staff with any needs or concerns. Patient verbalized agreement and understanding. Will continue to monitor for safety. 

## 2023-02-23 NOTE — ED Notes (Signed)
Patient A&O x 4, ambulatory. Patient discharged in no acute distress. Patient denied SI/HI, A/VH upon discharge. Patient verbalized understanding of all discharge instructions explained by staff, to include follow up appointments, RX's and safety plan.   Pt belongings returned to patient from locker # 3 intact. Patient escorted to lobby via staff for transport to destination. Safety maintained.

## 2023-02-23 NOTE — ED Notes (Signed)
Patient  sleeping in no acute stress. RR even and unlabored .Environment secured .Will continue to monitor for safely. 

## 2023-02-23 NOTE — ED Provider Notes (Signed)
FBC/OBS ASAP Discharge Summary  Date and Time: 02/23/2023 8:33 AM  Name: [name]  MRN:  [id number]   Discharge Diagnoses:  Final diagnoses:  MDD (major depressive disorder), recurrent severe, without psychosis (HCC)  GAD (generalized anxiety disorder)    Subjective: [name], 62 y.o., male patient seen face to face by this provider, consulted with Dr. Clovis Riley; and chart reviewed on 02/23/23.  On evaluation [name] reports he "feels ok" this morning.  Patient states he no longer has any suicidal thoughts.  He says it was triggered by "messing up" the food orders yesterday.  He has been in contact with his outpatient provider and has a follow up appointment scheduled.  He says he is ready to go home.  He lives with 2 roommates; he states he has solid social support.  Patient is not a danger to himself or others and does not meet criteria for IVC.   During evaluation [name] is sitting on the pull out chair in no acute distress.  He is alert, oriented x 4, calm, cooperative and attentive.  His mood is euthymic with congruent affect.  He has normal speech, and behavior.  Objectively there is no evidence of psychosis/mania or delusional thinking.  Patient is able to converse coherently, goal directed thoughts, no distractibility, or pre-occupation.  He  denies suicidal/self-harm/homicidal ideation, psychosis, and paranoia.  Patient answered questions appropriately.    Stay Summary: On admission, '[name] reports recent ongoing depression.  He states that today he remembered 3 manage order, and it was the wrong order to what his clients, and states that he began hearing voices that he was a failure and that he needed to commit suicide.  Patient states that he has a possible plan to take his life by giving himself a bolus of insulin on Saturday 02[place]26/2023 [place] a loading dock.  He states that he is waiting until Saturday because he promised his housemate [name] that he  would go with her to her family's house for Thanksgiving holidays, and states Saturday is the only time he has an opportunity to execute his plan.  He states that he lives with [name] and her boyfriend [name], since September 2024, and feels that it is a very good living situation.  He states that he has been diagnosed with depression for a very long time, states he just recently started having overwhelming and bombarding thoughts of hurting himself in July 2024, states they are commanding and negative thoughts to kill himself. He states in 1984 he was to jump off a building where he, that was overlooking a graveyard and wanted to fall onto one of the cemented graves.  In 1996 he attempted suicide with an overdose, states he has also had 2 attempts as a teenager.  Patient states he has been been admitted to a psychiatric inpatient facility throughout his lifetime about 8-9 times, denies having any legal issues, no access to guns.  Patient does not voice childhood abuse, of all kinds states that his father was "schizophrenic and alcoholic" and his mother had a diagnosis of depression and attempted suicide.Patient endorses using marijuana and alcohol occasionally, about once a week, states his last usage of both was last night."  Overnight, patient slept well.  This morning, patient denies all and is requesting discharge.  He says that he was triggered by the food mistake which is an isolated thing. He no longer has any thoughts of suicide.    Total Time  spent with patient: 20 minutes  Past Psychiatric History: MDD, GAD, previous hospitalizations Past Medical History: Diabetes, HTN, obesity, GERD Family History: None noted Family Psychiatric History: none noted Social History: Lives with 2 roommates Tobacco Cessation:  N/A, patient does not currently use tobacco products  Current Medications:  Current Facility-Administered Medications  Medication Dose Route Frequency Provider Last Rate Last Admin    acetaminophen (TYLENOL) tablet 650 mg  650 mg Oral Q6H PRN Motley-Mangrum, Jadeka A, PMHNP       alum & mag hydroxide-simeth (MAALOX/MYLANTA) 200-200-20 MG/5ML suspension 30 mL  30 mL Oral Q4H PRN Motley-Mangrum, Jadeka A, PMHNP       hydrOXYzine (ATARAX) tablet 25 mg  25 mg Oral TID PRN Motley-Mangrum, Jadeka A, PMHNP   25 mg at 02/22/23 2120   magnesium hydroxide (MILK OF MAGNESIA) suspension 30 mL  30 mL Oral Daily PRN Motley-Mangrum, Jadeka A, PMHNP       traZODone (DESYREL) tablet 50 mg  50 mg Oral QHS PRN Motley-Mangrum, Jadeka A, PMHNP   50 mg at 02/22/23 2120   Current Outpatient Medications  Medication Sig Dispense Refill   amLODipine-benazepril (LOTREL) 5-40 MG capsule TAKE 1 CAPSULE BY MOUTH  DAILY 90 capsule 3   ARIPiprazole (ABILIFY) 10 MG tablet Take 10 mg by mouth daily.     atorvastatin (LIPITOR) 40 MG tablet Take 40 mg by mouth daily.     buPROPion (WELLBUTRIN XL) 150 MG 24 hr tablet Take 150 mg by mouth daily.     glipiZIDE (GLUCOTROL XL) 5 MG 24 hr tablet Take 5 mg by mouth daily.     insulin isophane & regular human KwikPen (HUMULIN 70/30 KWIKPEN) (70-30) 100 UNIT/ML KwikPen INJECT 30-50 UNITS SUBCUTANEOUSLY BEFORE BREAKFAST AND DINNER (Patient taking differently: 30-50 Units. 30 units if CBG<200, 40 units if 201-299, 50 units if >300 INJECT 30-50 UNITS SUBCUTANEOUSLY BEFORE BREAKFAST AND DINNER) 15 mL 11   metFORMIN (GLUCOPHAGE) 500 MG tablet Take 500 mg by mouth 2 (two) times daily.     pantoprazole (PROTONIX) 40 MG tablet Take 40 mg by mouth daily.      PTA Medications:  Facility Ordered Medications  Medication   acetaminophen (TYLENOL) tablet 650 mg   alum & mag hydroxide-simeth (MAALOX/MYLANTA) 200-200-20 MG/5ML suspension 30 mL   magnesium hydroxide (MILK OF MAGNESIA) suspension 30 mL   hydrOXYzine (ATARAX) tablet 25 mg   traZODone (DESYREL) tablet 50 mg   PTA Medications  Medication Sig   amLODipine-benazepril (LOTREL) 5-40 MG capsule TAKE 1 CAPSULE BY MOUTH   DAILY   insulin isophane & regular human KwikPen (HUMULIN 70/30 KWIKPEN) (70-30) 100 UNIT/ML KwikPen INJECT 30-50 UNITS SUBCUTANEOUSLY BEFORE BREAKFAST AND DINNER (Patient taking differently: 30-50 Units. 30 units if CBG<200, 40 units if 201-299, 50 units if >300 INJECT 30-50 UNITS SUBCUTANEOUSLY BEFORE BREAKFAST AND DINNER)   atorvastatin (LIPITOR) 40 MG tablet Take 40 mg by mouth daily.   glipiZIDE (GLUCOTROL XL) 5 MG 24 hr tablet Take 5 mg by mouth daily.   pantoprazole (PROTONIX) 40 MG tablet Take 40 mg by mouth daily.       02/22/2023    3:28 PM 05/04/2021    1:15 PM 11/20/2020    9:54 AM  Depression screen PHQ 2/9  Decreased Interest 2 0 1  Down, Depressed, Hopeless 2 1 2   PHQ - 2 Score 4 1 3   Altered sleeping 1 0 0  Tired, decreased energy 2 0 0  Change in appetite 1 0 0  Feeling bad or  failure about yourself  2 1 1   Trouble concentrating 1 0 0  Moving slowly or fidgety/restless 1 0 0  Suicidal thoughts 2 0 1  PHQ-9 Score 14 2 5   Difficult doing work/chores Somewhat difficult Not difficult at all Somewhat difficult    Flowsheet Row ED from 02/22/2023 in [facility] Admission (Discharged) from 12/23/2020 in [facility] ED from 12/21/2020 in [facility] Department at [facility]  C-SSRS RISK CATEGORY High Risk No Risk No Risk       Musculoskeletal  Strength & Muscle Tone: within normal limits Gait & Station: normal Patient leans: N/A  Psychiatric Specialty Exam  Presentation  General Appearance:  Appropriate for Environment  Eye Contact: Good  Speech: Clear and Coherent  Speech Volume: Normal  Handedness: Right   Mood and Affect  Mood: Euthymic  Affect: Congruent   Thought Process  Thought Processes: Coherent  Descriptions of Associations:Intact  Orientation:Full (Time, Place and Person)  Thought Content:Logical  Diagnosis of Schizophrenia or Schizoaffective disorder in past:  No    Hallucinations:Hallucinations: None  Ideas of Reference:None  Suicidal Thoughts:Suicidal Thoughts: No  Homicidal Thoughts:Homicidal Thoughts: No   Sensorium  Memory: Immediate Good; Recent Good; Remote Good  Judgment: Impaired  Insight: Fair   Chartered certified accountant: Fair  Attention Span: Fair  Recall: Fair  Fund of Knowledge: Good  Language: Good   Psychomotor Activity  Psychomotor Activity: Psychomotor Activity: Normal   Assets  Assets: Communication Skills; Desire for Improvement; Social Support   Sleep  Sleep: Sleep: Fair Number of Hours of Sleep: 6   Nutritional Assessment (For OBS and FBC admissions only) Has the patient had a weight loss or gain of 10 pounds or more in the last 3 months?: Yes Has the patient had a decrease in food intake/or appetite?: Yes Does the patient have dental problems?: No Does the patient have eating habits or behaviors that may be indicators of an eating disorder including binging or inducing vomiting?: No Has the patient recently lost weight without trying?: 1 Has the patient been eating poorly because of a decreased appetite?: 1 Malnutrition Screening Tool Score: 2    Physical Exam  Physical Exam Vitals and nursing note reviewed.  Constitutional:      Appearance: He is obese.  Eyes:     Pupils: Pupils are equal, round, and reactive to light.  Pulmonary:     Effort: Pulmonary effort is normal.  Skin:    General: Skin is dry.  Neurological:     Mental Status: He is alert and oriented to person, place, and time.    Review of Systems  Psychiatric/Behavioral:  Positive for depression.   All other systems reviewed and are negative.  Blood pressure 121/75, pulse 72, temperature 98.1 F (36.7 C), temperature source Oral, resp. rate 17, SpO2 96%. There is no height or weight on file to calculate BMI.  Demographic Factors:  Male and Caucasian  Loss Factors: Decrease in vocational  status  Historical Factors: Prior suicide attempts  Risk Reduction Factors:   Living with another person, especially a relative and Positive social support  Continued Clinical Symptoms:  More than one psychiatric diagnosis Previous Psychiatric Diagnoses and Treatments  Cognitive Features That Contribute To Risk:  None    Suicide Risk:  Mild:  Suicidal ideation of limited frequency, intensity, duration, and specificity.  There are no identifiable plans, no associated intent, mild dysphoria and related symptoms, good self-control (both objective and subjective  assessment), few other risk factors, and identifiable protective factors, including available and accessible social support.  Plan Of Care/Follow-up recommendations:  Activity:  Resume as tolerated Diet:  Resume diabetic diet low in refined carbohydrates Other:  Follow up with your outpatient psychiatric provider  Disposition: Discharge home to self care  Thomes Lolling, NP 02/23/2023, 8:33 AM

## 2023-03-10 ENCOUNTER — Ambulatory Visit: Payer: 59 | Admitting: Family Medicine

## 2023-03-10 ENCOUNTER — Encounter: Payer: Self-pay | Admitting: Family Medicine

## 2023-03-10 VITALS — BP 140/62 | HR 89 | Temp 98.2°F | Ht 68.0 in | Wt 268.0 lb

## 2023-03-10 DIAGNOSIS — E1169 Type 2 diabetes mellitus with other specified complication: Secondary | ICD-10-CM

## 2023-03-10 DIAGNOSIS — Z23 Encounter for immunization: Secondary | ICD-10-CM

## 2023-03-10 DIAGNOSIS — Z794 Long term (current) use of insulin: Secondary | ICD-10-CM

## 2023-03-10 DIAGNOSIS — I10 Essential (primary) hypertension: Secondary | ICD-10-CM | POA: Diagnosis not present

## 2023-03-10 DIAGNOSIS — E119 Type 2 diabetes mellitus without complications: Secondary | ICD-10-CM

## 2023-03-10 DIAGNOSIS — Z6841 Body Mass Index (BMI) 40.0 and over, adult: Secondary | ICD-10-CM

## 2023-03-10 DIAGNOSIS — F3342 Major depressive disorder, recurrent, in full remission: Secondary | ICD-10-CM | POA: Diagnosis not present

## 2023-03-10 DIAGNOSIS — Z79899 Other long term (current) drug therapy: Secondary | ICD-10-CM

## 2023-03-10 DIAGNOSIS — E785 Hyperlipidemia, unspecified: Secondary | ICD-10-CM

## 2023-03-10 MED ORDER — METFORMIN HCL 500 MG PO TABS: 500.0000 mg | ORAL_TABLET | Freq: Two times a day (BID) | ORAL | 3 refills | Status: DC

## 2023-03-10 MED ORDER — AMLODIPINE BESY-BENAZEPRIL HCL 5-40 MG PO CAPS: 1.0000 | ORAL_CAPSULE | Freq: Every day | ORAL | 3 refills | Status: DC

## 2023-03-10 MED ORDER — GLIPIZIDE ER 5 MG PO TB24: 5.0000 mg | ORAL_TABLET | Freq: Every day | ORAL | 3 refills | Status: DC

## 2023-03-10 MED ORDER — ATORVASTATIN CALCIUM 40 MG PO TABS
40.0000 mg | ORAL_TABLET | Freq: Every day | ORAL | 2 refills | Status: DC
  Filled 2023-11-18: qty 90, 90d supply, fill #0

## 2023-03-10 MED ORDER — FREESTYLE LIBRE 3 SENSOR MISC: 1.0000 | 11 refills | Status: DC

## 2023-03-10 MED ORDER — PANTOPRAZOLE SODIUM 40 MG PO TBEC: 40.0000 mg | DELAYED_RELEASE_TABLET | Freq: Every day | ORAL | 3 refills | Status: DC

## 2023-03-10 NOTE — Progress Notes (Signed)
Phone [phone] In person visit   Subjective:   [name] is a 62 y.o. year old very pleasant male patient who presents for/with See problem oriented charting Chief Complaint  Patient presents with   discuss meds   Past Medical History-  Patient Active Problem List   Diagnosis Date Noted   Morbid obesity (HCC) 12/13/2008    Priority: High   Type 2 diabetes mellitus (HCC) 03/05/2008    Priority: High   History of melanoma. R ear.  12/16/2006    Priority: High   Hypertension associated with diabetes (HCC) 03/04/2015    Priority: Medium    GERD (gastroesophageal reflux disease) 03/04/2015    Priority: Medium    Major depression, recurrent, full remission (HCC) 12/16/2006    Priority: Medium    Hyperlipidemia associated with type 2 diabetes mellitus (HCC) 11/07/2006    Priority: Medium    Eczema 12/02/2015    Priority: Low   S/P ORIF (open reduction internal fixation) fracture 12/23/2020    Medications- reviewed and updated Current Outpatient Medications  Medication Sig Dispense Refill   ARIPiprazole (ABILIFY) 10 MG tablet Take 10 mg by mouth daily.     buPROPion (WELLBUTRIN XL) 150 MG 24 hr tablet Take 150 mg by mouth daily.     Continuous Glucose Sensor (FREESTYLE LIBRE 3 SENSOR) MISC 1 each by Does not apply route every 14 (fourteen) days. Place 1 sensor on the skin every 14 days. Use to check glucose continuously 2 each 11   insulin isophane & regular human KwikPen (HUMULIN 70/30 KWIKPEN) (70-30) 100 UNIT/ML KwikPen INJECT 30-50 UNITS SUBCUTANEOUSLY BEFORE BREAKFAST AND DINNER (Patient taking differently: 30-50 Units. 30 units if CBG<200, 40 units if 201-299, 50 units if >300 INJECT 30-50 UNITS SUBCUTANEOUSLY BEFORE BREAKFAST AND DINNER) 15 mL 11   sertraline (ZOLOFT) 50 MG tablet Take 50 mg by mouth in the morning and at bedtime. Through Dr. Evelene Croon     amLODipine-benazepril (LOTREL) 5-40 MG capsule Take 1 capsule by mouth daily. 90 capsule 3   atorvastatin  (LIPITOR) 40 MG tablet Take 1 tablet (40 mg total) by mouth daily. 90 tablet 3   glipiZIDE (GLUCOTROL XL) 5 MG 24 hr tablet Take 1 tablet (5 mg total) by mouth daily. 90 tablet 3   metFORMIN (GLUCOPHAGE) 500 MG tablet Take 1 tablet (500 mg total) by mouth 2 (two) times daily. 180 tablet 3   pantoprazole (PROTONIX) 40 MG tablet Take 1 tablet (40 mg total) by mouth daily. 90 tablet 3   No current facility-administered medications for this visit.     Objective:  BP (!) 140/62   Pulse 89   Temp 98.2 F (36.8 C)   Ht 5\' 8"  (1.727 m)   Wt 268 lb (121.6 kg)   SpO2 95%   BMI 40.75 kg/m  Gen: NAD, resting comfortably CV: RRR no murmurs rubs or gallops Lungs: CTAB no crackles, wheeze, rhonchi Ext: trace edema Skin: warm, dry  Diabetic Foot Exam - Simple   Simple Foot Form Diabetic Foot exam was performed with the following findings: Yes 03/10/2023 11:15 AM  Visual Inspection No deformities, no ulcerations, no other skin breakdown bilaterally: Yes Sensation Testing Intact to touch and monofilament testing bilaterally: Yes Pulse Check Posterior Tibialis and Dorsalis pulse intact bilaterally: Yes Comments        Assessment and Plan   #social update- -slowed drinking way down to 1 a week -stopped marijuana -still in [place] and moving in January with friends Meagan and Theodoro Grist -  will move to [place]- retired and doing Research scientist (physical sciences) -stress with family- he's hoping will get better  #Diabetes/morbid obesity S:Patient using 70/30 insulin typically 40-50 units twice daily -Also orally on glipizide 5 mg extended release and metformin 5 mg twice daily -adding jardiance 07/15/20 but now off  and Ozempic as well - liraglutide/victoza was expensive  - freestyle libre 3 is covered and helpful- has had much improved readings lately. Did have one as low as 75 Lab Results  Component Value Date   HGBA1C 11.6 (H) 02/22/2023   HGBA1C 11.4 (A) 05/04/2021   HGBA1C 11.6 (H) 12/23/2020  A/P:  diabetes hopefully improving- sounds like it is- update a1c in future but too soon for now - will plan on 3 month follow up     #Hyperlipidemia S: Compliant with rosuvastatin 10 mg in past- now on atorvastatin 40 mg Lab Results  Component Value Date   CHOL 186 05/18/2021   HDL 59.50 05/18/2021   LDLCALC 79 03/06/2020   LDLDIRECT 97.0 05/18/2021   TRIG 279.0 (H) 05/18/2021   CHOLHDL 3 05/18/2021  A/P: not sure why they changed this but update lipids to assess control- for now continue current medications and refill today   #Hypertension S:Compliant with amlodipine benazepril 5-40mg   BP Readings from Last 3 Encounters:  03/10/23 (!) 140/62  02/23/23 121/75  05/04/21 138/78  A/P: blood pressure slightly high today but has been controled- he has home cuff and asked him to update me in a week with home readings- for now continue current medications and I refilled this    #GERD S: Medication: Pantoprazole 40 mg daily  -B12 has been low normal so I recommended at least 250 mcg B12 daily in past- has been off  A/P: stable- continue current medicines -off B12 but was on in past- update B12 today    % #Depression-follows with psychiatry Dr. Evelene Croon S: medication(s):  Abilify 10mg , Wellbutrin 150 mg and sertraline 50 mg twice daily- recently increased around time of 2nd hospital visit .  No suicidal ideation.  -Significant worsening of depression in June 2024  with week hospitalization leading to suicidal thoughts through at least November 2024 including overnight stay on November 26 - previously he had to come off of pristiq 50mg , trintellix, vibryyd due to cost    03/10/2023   10:23 AM 02/22/2023    3:28 PM 05/04/2021    1:15 PM  Depression screen PHQ 2/9  Decreased Interest 0 2 0  Down, Depressed, Hopeless 2 2 1   PHQ - 2 Score 2 4 1   Altered sleeping 3 1 0  Tired, decreased energy 1 2 0  Change in appetite 1 1 0  Feeling bad or failure about yourself  3 2 1   Trouble concentrating 1 1  0  Moving slowly or fidgety/restless 0 1 0  Suicidal thoughts 2 2 0  PHQ-9 Score 13 14 2   Difficult doing work/chores Somewhat difficult Somewhat difficult Not difficult at all  A/P: depression still noted but hopefully will  improve with sertraline 50 mg twice daily. Prior audible voices to kill himself- he is not longer hearing those - still bombardment of thoughts but far less frequent- had been daily.  - he would use [facility] if more intrusive   # Mild LFt elevations- likely fatty liver- monitor - looked good on 02/22/23 Lab Results  Component Value Date   ALT 24 02/22/2023   AST 23 02/22/2023   ALKPHOS 52 02/22/2023  BILITOT 0.6 02/22/2023   Recommended follow up: Return in about 3 months (around 06/08/2023) for followup or sooner if needed.Schedule b4 you leave. Future Appointments  Date Time Provider Department Center  07/05/2023  1:20 PM Shelva Majestic, MD [facility]   Lab/Order associations:   ICD-10-CM   1. Type 2 diabetes mellitus without complication, with long-term current use of insulin (HCC)  E11.9 Urine Microalbumin w/creat. ratio   Z79.4     2. Primary hypertension  I10     3. Hyperlipidemia associated with type 2 diabetes mellitus (HCC)  E11.69 Lipid panel   E78.5     4. Major depression, recurrent, full remission (HCC)  F33.42     5. Morbid obesity (HCC)  E66.01     6. High risk medication use  Z79.899 Vitamin B12    7. Need for influenza vaccination  Z23 Flu vaccine trivalent PF, 6mos and older(Flulaval,Afluria,Fluarix,Fluzone)      Meds ordered this encounter  Medications   metFORMIN (GLUCOPHAGE) 500 MG tablet    Sig: Take 1 tablet (500 mg total) by mouth 2 (two) times daily.    Dispense:  180 tablet    Refill:  3   glipiZIDE (GLUCOTROL XL) 5 MG 24 hr tablet    Sig: Take 1 tablet (5 mg total) by mouth daily.    Dispense:  90 tablet    Refill:  3   pantoprazole (PROTONIX) 40 MG tablet    Sig: Take 1 tablet (40  mg total) by mouth daily.    Dispense:  90 tablet    Refill:  3   atorvastatin (LIPITOR) 40 MG tablet    Sig: Take 1 tablet (40 mg total) by mouth daily.    Dispense:  90 tablet    Refill:  3   amLODipine-benazepril (LOTREL) 5-40 MG capsule    Sig: Take 1 capsule by mouth daily.    Dispense:  90 capsule    Refill:  3   Continuous Glucose Sensor (FREESTYLE LIBRE 3 SENSOR) MISC    Sig: 1 each by Does not apply route every 14 (fourteen) days. Place 1 sensor on the skin every 14 days. Use to check glucose continuously    Dispense:  2 each    Refill:  11    Return precautions advised.  Tana Conch, MD

## 2023-03-10 NOTE — Patient Instructions (Addendum)
[facility] contact- Please call to schedule visit and/or procedure colonoscopy as overdue Address: [address] [name], [place], [place] [address] Phone: [phone]    blood pressure slightly high today but has been controled- he has home cuff and asked him to update me in a week with home readings- for now continue current medications and I refilled this    Get diabetic eye exam scheduled.  Schedule a lab visit at the check out desk within 2 weeks. Return for future fasting labs meaning nothing but water after midnight please. Ok to take your medications with water.   I wonder if Eye Movement Desensitization and Reprocessing (EMDR) type therapy may be helpful for you or brain spotting  Recommended follow up: Return in about 3 months (around 06/08/2023) for followup or sooner if needed.Schedule b4 you leave.

## 2023-03-16 ENCOUNTER — Telehealth: Payer: Self-pay

## 2023-03-16 ENCOUNTER — Other Ambulatory Visit (HOSPITAL_COMMUNITY): Payer: Self-pay

## 2023-03-16 NOTE — Telephone Encounter (Signed)
Pharmacy Patient Advocate Encounter   Received notification from CoverMyMeds that prior authorization for FreeStyle Libre 3 Sensor is required/requested.   Insurance verification completed.   The patient is insured through [facility] .   Per test claim: PA required; PA submitted to above mentioned insurance via CoverMyMeds Key/confirmation #/EOC [id number] Status is pending

## 2023-03-16 NOTE — Telephone Encounter (Signed)
Pharmacy Patient Advocate Encounter  Received notification from [facility] that Prior Authorization for FreeStyle Libre 3 Sensor  has been DENIED.  See denial reason below. No denial letter attached in CMM. Will attach denial letter to Media tab once received.    PA #/Case ID/Reference #: [id number]

## 2023-04-25 DIAGNOSIS — F329 Major depressive disorder, single episode, unspecified: Secondary | ICD-10-CM | POA: Insufficient documentation

## 2023-05-09 DIAGNOSIS — F1020 Alcohol dependence, uncomplicated: Secondary | ICD-10-CM | POA: Insufficient documentation

## 2023-05-11 DIAGNOSIS — G510 Bell's palsy: Secondary | ICD-10-CM | POA: Insufficient documentation

## 2023-06-09 ENCOUNTER — Ambulatory Visit: Payer: 59 | Admitting: Family Medicine

## 2023-06-16 ENCOUNTER — Ambulatory Visit: Payer: 59 | Admitting: Family Medicine

## 2023-07-05 ENCOUNTER — Encounter: Payer: 59 | Admitting: Family Medicine

## 2023-07-20 ENCOUNTER — Ambulatory Visit: Admitting: Family Medicine

## 2023-07-25 ENCOUNTER — Ambulatory Visit: Admitting: Family Medicine

## 2023-08-12 ENCOUNTER — Ambulatory Visit: Admitting: Family Medicine

## 2023-08-25 ENCOUNTER — Ambulatory Visit (INDEPENDENT_AMBULATORY_CARE_PROVIDER_SITE_OTHER)
Admission: EM | Admit: 2023-08-25 | Discharge: 2023-08-26 | Disposition: A | Discharge status: Other Acute Inpatient Hospital | Source: Home / Self Care

## 2023-08-25 DIAGNOSIS — I10 Essential (primary) hypertension: Secondary | ICD-10-CM | POA: Insufficient documentation

## 2023-08-25 DIAGNOSIS — Z9151 Personal history of suicidal behavior: Secondary | ICD-10-CM | POA: Insufficient documentation

## 2023-08-25 DIAGNOSIS — Z794 Long term (current) use of insulin: Secondary | ICD-10-CM | POA: Insufficient documentation

## 2023-08-25 DIAGNOSIS — F1121 Opioid dependence, in remission: Secondary | ICD-10-CM | POA: Insufficient documentation

## 2023-08-25 DIAGNOSIS — R45851 Suicidal ideations: Secondary | ICD-10-CM

## 2023-08-25 DIAGNOSIS — R9431 Abnormal electrocardiogram [ECG] [EKG]: Secondary | ICD-10-CM | POA: Insufficient documentation

## 2023-08-25 DIAGNOSIS — Z79899 Other long term (current) drug therapy: Secondary | ICD-10-CM | POA: Insufficient documentation

## 2023-08-25 DIAGNOSIS — E119 Type 2 diabetes mellitus without complications: Secondary | ICD-10-CM | POA: Insufficient documentation

## 2023-08-25 DIAGNOSIS — Z7984 Long term (current) use of oral hypoglycemic drugs: Secondary | ICD-10-CM | POA: Insufficient documentation

## 2023-08-25 DIAGNOSIS — F17201 Nicotine dependence, unspecified, in remission: Secondary | ICD-10-CM

## 2023-08-25 DIAGNOSIS — F333 Major depressive disorder, recurrent, severe with psychotic symptoms: Secondary | ICD-10-CM | POA: Diagnosis not present

## 2023-08-25 DIAGNOSIS — Z87891 Personal history of nicotine dependence: Secondary | ICD-10-CM | POA: Insufficient documentation

## 2023-08-25 LAB — POCT URINE DRUG SCREEN - MANUAL ENTRY (I-SCREEN)
POC Amphetamine UR: NOT DETECTED
POC Buprenorphine (BUP): NOT DETECTED
POC Cocaine UR: NOT DETECTED
POC Marijuana UR: NOT DETECTED
POC Methadone UR: NOT DETECTED
POC Methamphetamine UR: NOT DETECTED
POC Morphine: NOT DETECTED
POC Oxazepam (BZO): NOT DETECTED
POC Oxycodone UR: NOT DETECTED
POC Secobarbital (BAR): NOT DETECTED

## 2023-08-25 LAB — COMPREHENSIVE METABOLIC PANEL WITH GFR
ALT: 30 U/L (ref 0–44)
AST: 29 U/L (ref 15–41)
Albumin: 4.2 g/dL (ref 3.5–5.0)
Alkaline Phosphatase: 53 U/L (ref 38–126)
Anion gap: 13 (ref 5–15)
BUN: 13 mg/dL (ref 8–23)
CO2: 21 mmol/L — ABNORMAL LOW (ref 22–32)
Calcium: 9.7 mg/dL (ref 8.9–10.3)
Chloride: 102 mmol/L (ref 98–111)
Creatinine, Ser: 1.14 mg/dL (ref 0.61–1.24)
GFR, Estimated: >60 mL/min (ref >60)
Glucose, Bld: 178 mg/dL — ABNORMAL HIGH (ref 70–99)
Potassium: 3.9 mmol/L (ref 3.5–5.1)
Sodium: 136 mmol/L (ref 135–145)
Total Bilirubin: 0.6 mg/dL (ref 0.0–1.2)
Total Protein: 7.4 g/dL (ref 6.5–8.1)

## 2023-08-25 LAB — ETHANOL: Alcohol, Ethyl (B): <15 mg/dL (ref <15)

## 2023-08-25 LAB — CBC WITH DIFFERENTIAL/PLATELET
Abs Immature Granulocytes: 0.02 10*3/uL (ref 0.00–0.07)
Basophils Absolute: 0.1 10*3/uL (ref 0.0–0.1)
Basophils Relative: 1 %
Eosinophils Absolute: 0.3 10*3/uL (ref 0.0–0.5)
Eosinophils Relative: 3 %
HCT: 43.9 % (ref 39.0–52.0)
Hemoglobin: 14.8 g/dL (ref 13.0–17.0)
Immature Granulocytes: 0 %
Lymphocytes Relative: 23 %
Lymphs Abs: 2.1 10*3/uL (ref 0.7–4.0)
MCH: 27.5 pg (ref 26.0–34.0)
MCHC: 33.7 g/dL (ref 30.0–36.0)
MCV: 81.6 fL (ref 80.0–100.0)
Monocytes Absolute: 0.5 10*3/uL (ref 0.1–1.0)
Monocytes Relative: 5 %
Neutro Abs: 6.1 10*3/uL (ref 1.7–7.7)
Neutrophils Relative %: 68 %
Platelets: 282 10*3/uL (ref 150–400)
RBC: 5.38 MIL/uL (ref 4.22–5.81)
RDW: 13.1 % (ref 11.5–15.5)
WBC: 9 10*3/uL (ref 4.0–10.5)
nRBC: 0 % (ref 0.0–0.2)

## 2023-08-25 LAB — LIPID PANEL
Cholesterol: 138 mg/dL (ref 0–200)
HDL: 41 mg/dL (ref >40)
LDL Cholesterol: 57 mg/dL (ref 0–99)
Total CHOL/HDL Ratio: 3.4 ratio
Triglycerides: 201 mg/dL — ABNORMAL HIGH (ref <150)
VLDL: 40 mg/dL (ref 0–40)

## 2023-08-25 LAB — URINALYSIS, ROUTINE W REFLEX MICROSCOPIC
Bilirubin Urine: NEGATIVE
Glucose, UA: 50 mg/dL — AB
Hgb urine dipstick: NEGATIVE
Ketones, ur: 5 mg/dL — AB
Leukocytes,Ua: NEGATIVE
Nitrite: NEGATIVE
Protein, ur: 30 mg/dL — AB
Specific Gravity, Urine: 1.028 (ref 1.005–1.030)
pH: 5 (ref 5.0–8.0)

## 2023-08-25 LAB — HEMOGLOBIN A1C
Hgb A1c MFr Bld: 9.4 % — ABNORMAL HIGH (ref 4.8–5.6)
Mean Plasma Glucose: 223.08 mg/dL

## 2023-08-25 LAB — TSH: TSH: 1.049 u[IU]/mL (ref 0.350–4.500)

## 2023-08-25 LAB — MAGNESIUM: Magnesium: 1.9 mg/dL (ref 1.7–2.4)

## 2023-08-25 MED ORDER — HALOPERIDOL 5 MG PO TABS: 5.0000 mg | ORAL_TABLET | Freq: Three times a day (TID) | ORAL | Status: DC | PRN

## 2023-08-25 MED ORDER — GLIPIZIDE ER 2.5 MG PO TB24
5.0000 mg | ORAL_TABLET | Freq: Every day | ORAL | Status: DC
  Administered 2023-08-26: 5 mg via ORAL
  Filled 2023-08-25: qty 2

## 2023-08-25 MED ORDER — DIPHENHYDRAMINE HCL 50 MG PO CAPS
50.0000 mg | ORAL_CAPSULE | Freq: Three times a day (TID) | ORAL | Status: DC | PRN
Start: 2023-08-25 — End: 2023-08-26

## 2023-08-25 MED ORDER — DIPHENHYDRAMINE HCL 25 MG PO CAPS: 25.0000 mg | ORAL_CAPSULE | Freq: Four times a day (QID) | ORAL | Status: DC | PRN

## 2023-08-25 MED ORDER — LORAZEPAM 2 MG/ML IJ SOLN
2.0000 mg | Freq: Three times a day (TID) | INTRAMUSCULAR | Status: DC | PRN
Start: 2023-08-25 — End: 2023-08-26

## 2023-08-25 MED ORDER — BENAZEPRIL HCL 20 MG PO TABS
40.0000 mg | ORAL_TABLET | Freq: Every day | ORAL | Status: DC
  Administered 2023-08-25 – 2023-08-26 (×2): 40 mg via ORAL
  Filled 2023-08-25 (×2): qty 2

## 2023-08-25 MED ORDER — TRAZODONE HCL 50 MG PO TABS: 50.0000 mg | ORAL_TABLET | Freq: Every evening | ORAL | Status: DC | PRN

## 2023-08-25 MED ORDER — PANTOPRAZOLE SODIUM 40 MG PO TBEC
40.0000 mg | DELAYED_RELEASE_TABLET | Freq: Every day | ORAL | Status: DC
  Administered 2023-08-26: 40 mg via ORAL
  Filled 2023-08-25: qty 1

## 2023-08-25 MED ORDER — METFORMIN HCL 500 MG PO TABS
1000.0000 mg | ORAL_TABLET | Freq: Two times a day (BID) | ORAL | Status: DC
  Administered 2023-08-25 – 2023-08-26 (×2): 1000 mg via ORAL
  Filled 2023-08-25 (×2): qty 2

## 2023-08-25 MED ORDER — ATORVASTATIN CALCIUM 40 MG PO TABS
40.0000 mg | ORAL_TABLET | Freq: Every day | ORAL | Status: DC
  Administered 2023-08-26: 40 mg via ORAL
  Filled 2023-08-25: qty 1

## 2023-08-25 MED ORDER — QUETIAPINE FUMARATE 100 MG PO TABS
100.0000 mg | ORAL_TABLET | Freq: Every day | ORAL | Status: DC
  Administered 2023-08-25: 100 mg via ORAL
  Filled 2023-08-25: qty 1

## 2023-08-25 MED ORDER — DIPHENHYDRAMINE HCL 50 MG/ML IJ SOLN: 50.0000 mg | Freq: Three times a day (TID) | INTRAMUSCULAR | Status: DC | PRN

## 2023-08-25 MED ORDER — LORAZEPAM 2 MG/ML IJ SOLN: 2.0000 mg | Freq: Three times a day (TID) | INTRAMUSCULAR | Status: DC | PRN

## 2023-08-25 MED ORDER — AMLODIPINE BESYLATE 5 MG PO TABS
5.0000 mg | ORAL_TABLET | Freq: Every day | ORAL | Status: DC
  Administered 2023-08-25 – 2023-08-26 (×2): 5 mg via ORAL
  Filled 2023-08-25 (×2): qty 1

## 2023-08-25 MED ORDER — SERTRALINE HCL 50 MG PO TABS
50.0000 mg | ORAL_TABLET | Freq: Once | ORAL | Status: AC
  Administered 2023-08-25: 50 mg via ORAL
  Filled 2023-08-25: qty 1

## 2023-08-25 MED ORDER — SERTRALINE HCL 100 MG PO TABS: 100.0000 mg | ORAL_TABLET | Freq: Every day | ORAL | Status: DC

## 2023-08-25 MED ORDER — ALUM & MAG HYDROXIDE-SIMETH 200-200-20 MG/5ML PO SUSP: 30.0000 mL | ORAL | Status: DC | PRN

## 2023-08-25 MED ORDER — MAGNESIUM HYDROXIDE 400 MG/5ML PO SUSP: 30.0000 mL | Freq: Every day | ORAL | Status: DC | PRN

## 2023-08-25 MED ORDER — HALOPERIDOL LACTATE 5 MG/ML IJ SOLN
10.0000 mg | Freq: Three times a day (TID) | INTRAMUSCULAR | Status: DC | PRN
Start: 2023-08-25 — End: 2023-08-26

## 2023-08-25 MED ORDER — HALOPERIDOL LACTATE 5 MG/ML IJ SOLN: 5.0000 mg | Freq: Three times a day (TID) | INTRAMUSCULAR | Status: DC | PRN

## 2023-08-25 MED ORDER — ENSURE ENLIVE PO LIQD
237.0000 mL | Freq: Three times a day (TID) | ORAL | Status: DC
  Administered 2023-08-25 (×2): 237 mL via ORAL
  Filled 2023-08-25 (×3): qty 237

## 2023-08-25 MED ORDER — SERTRALINE HCL 100 MG PO TABS
100.0000 mg | ORAL_TABLET | Freq: Every day | ORAL | Status: DC
  Administered 2023-08-26: 100 mg via ORAL
  Filled 2023-08-25: qty 1

## 2023-08-25 MED ORDER — ACETAMINOPHEN 325 MG PO TABS
650.0000 mg | ORAL_TABLET | Freq: Four times a day (QID) | ORAL | Status: DC | PRN
  Administered 2023-08-25 – 2023-08-26 (×2): 650 mg via ORAL
  Filled 2023-08-25 (×2): qty 2

## 2023-08-25 MED ORDER — HYDROXYZINE HCL 25 MG PO TABS: 25.0000 mg | ORAL_TABLET | Freq: Three times a day (TID) | ORAL | Status: DC | PRN
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
## 2023-08-25 NOTE — ED Notes (Signed)
 Pt admitted to [facility] for AH telling him to kill himself. Pt denies knowing how he would do it. Reports feeling safe here at [facility] and believes his medication needs to be adjusted. Reports he is medication compliant although he feels the meds are not working. Pt is a Diabetic and takes oral medication only for control. Pt appearance is slightly disheveled. His affect is sad. Pt reported his dog passed away last week, "my only companion" and that's when his mood began to shift. Pt cooperative with admission process and skin check. No contraband, no lice. Pt oriented to unit. Meal and fluids offered. pt contracted to safety and encouraged to come to staff if his symptoms worsen. He verbalized understanding. Pt is fall risk as he reported he fell last week at home and that they are happening more often. Pt given skid resistant socks and Yellow fall risk band applied. Will continue to monitor and report changes as noted.
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
## 2023-08-25 NOTE — ED Notes (Signed)
 Patient resting quietly in bed with eyes closed. Unlabored breathing, skin warm and dry, NAD. Facility protocol safety checks in place. Pt is currently safe on the unit.

## 2023-08-25 NOTE — ED Notes (Signed)
 Pt awake and calm at end ogf shift. Denies AH at this time. Plan is to transfer to [facility] in the morning. Pt verbalized understanding. Safety maintained

## 2023-08-25 NOTE — ED Notes (Signed)
 Patient resting quietly in bed with eyes closed. Unlabored breathing, skin warm and dry, NAD noted. Facility protocol safety checks in place. Pt is currently safe on the unit.

## 2023-08-25 NOTE — ED Provider Notes (Signed)
 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 He reported the symptoms to his outpatient psychiatrist, who called 911, and patient was escorted via [employer] to this facility.  HPI: Recently, [name] has been obsessing about suicide, particularly since the death of his dog last week, which has intensified his feelings of loss and grief. He reported his suicidal thoughts and plans to his outpatient psychiatrist, who then called 911, leading to his escort by [employer] to the facility. Although [name] owns a firearm, his sister has taken it and stored it in a facility in [place], ensuring he does not have access to it.  The patient reports he lives alone in a boarding house in [place] and reports having no family nearby. However, he has a supportive church community and friends, and his pastor encouraged him to seek help at the facility. Despite his struggles, [name] attributes his faith in god and his friends and church community as a huge source of support.   [name] reports experiencing auditory hallucinations that began last year, describing a male voice, sometimes resembling his own, telling him to kill himself. He is unsure if these are part of his internal monologue but feels they sometimes come from outside the room. He denies any current or past  visual hallucinations, paranoia, or delusional beliefs. His depressive symptoms include low mood, anhedonia, poor concentration, fatigue, psychomotor slowing, feelings of hopelessness, worthlessness, and guilt. He also reports a poor appetite, having not eaten since Monday except for a piece of bread on Wednesday, and difficulty sleeping.  In January, [name] was treated at [facility] in [facility] and attended [facility] for two weeks of residential treatment for alcohol and cannabis use. He was then transferred to [facility] for five weeks due to worsening suicidal ideation. Despite these challenges, [name] remains committed to seeking help and improving his mental health.   Past Psychiatric Hx: Current Psychiatrist: Dr. Daphine Eagle Current Therapist: Denies  Psychiatric Dx: treatment resistant depression, MDD,  Current meds: seroquel, zoloft,  Psychiatric medication history: abilify , wellbutrin ,  priror TCA and Maoi use in the 1970s, lithium, lexapro, prozac, celexa, effexor, cymbalta, risperdal Psychiatric medication compliance history: Psychiatric hospitalizations:  reports 10 prior psychiatric hospitalizations Neuromodulation history: denies History of suicide: reports 6 lifetime attempts, 5 attempts via overdose, and 1 was jumping form abuilding. Most recent attempt in January was to overdose on insulin  but police were doing a wellness check and intervened.  NSSIB Hx: reports prior hx of cutting and burning himself when he was younger, he has not doesn't this since he was in college.  History of homicide or aggression: Denies  Past Medical Hx: PCP: Clarisa Crooked, MD Chronic Medical Illness: T2DM, HTN, GERD, HLD Medications/Supplements:metformin , glipizide , pantoprazole , lipitor, lisinopril Allergies: Denies Hospitalizations: Trauma: pt reports prior concussions Seizures: denies  Substance Use Hx: Alcohol: reports ocassional use, 1 beer every coupel fo weeks.  Reports prior hx of abuse.  Denies seizures, Dts. Tobacco: quit 30 years ago Cannabis: Quit since Jan 2025 after residential treatment Opioids: pt reports heroin use in HS, reports last sue of opiates 20 years ago Cocaine: reports last use in the 1980s, reports prior hx of abuse Illicit drugs: reports prior hx of of LSD (decades ago), mushrooms (last year)  Rehab hx: [facility] 2025  Family Medical Hx:  Patient reports heart disease runs in the family Father - died of stroke at 37 Davis Maternal grandmother - died of MI in his 7s   Family Psychiatric Hx:  Maternal grandmother - completed suicide Paternal great grandfather - completed suicide Paternal uncle - completed suicide  Social Hx: Living situation: Lives in [place] in a boarding house by himself Educational Hx: completed college, bachelors in [place] Occupational Hx: retired, 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 He reports he worked in the [employer], which he found most rewarding.  Legal Hx: No Children: Denies Spiritual Hx: Christian Access to weapons: Denies   Psychiatric Specialty Exam  Presentation General Appearance:  Appropriate for Environment  Eye Contact: Good  Speech: Clear and Coherent; Normal Rate  Speech Volume: Normal  Handedness: Right   Mood and Affect  Mood: Hopeless; Depressed  Affect: Congruent; Tearful   Thought Process  Thought Processes: Linear  Descriptions of Associations:Intact  Orientation:None  Thought Content:Logical  Diagnosis of Schizophrenia or Schizoaffective disorder in past: No   Hallucinations:Hallucinations: None  Ideas of Reference:None  Suicidal Thoughts:Suicidal Thoughts: Yes, Active SI Active Intent and/or Plan: With Plan; With Intent  Homicidal Thoughts:Homicidal Thoughts: No   Sensorium  Memory: Immediate Good; Recent Good; Remote  Good  Judgment: Fair  Insight: Fair   Art therapist  Concentration: Good  Attention Span: Good  Recall: Good  Fund of Knowledge: Good  Language: Good   Psychomotor Activity  Psychomotor Activity:Psychomotor Activity: Decreased   Assets  Assets: Desire for Improvement; Resilience; Communication Skills   Sleep  Sleep:Sleep: Good    Physical Exam Vitals and nursing note reviewed.  HENT:     Head: Normocephalic and atraumatic.  Pulmonary:     Effort: Pulmonary effort is normal. No respiratory distress.  Musculoskeletal:        General: Normal range of motion.  Skin:    General: Skin is warm and dry.    Review of Systems  All other systems reviewed and are negative.   Blood pressure (!) 146/89, pulse 78, temperature 97.8 F (36.6 C), temperature source Oral, resp. rate 18, SpO2 99%. There is no height or weight on file to calculate BMI.    Last Labs:  Admission on 08/25/2023  Component Date Value Ref Range Status   WBC 08/25/2023 9.0  4.0 - 10.5 K/uL Final   RBC 08/25/2023 5.38  4.22 - 5.81 MIL/uL Final   Hemoglobin 08/25/2023 14.8  13.0 - 17.0 g/dL Final   HCT 09/81/1914 43.9  39.0 - 52.0 % Final   MCV 08/25/2023 81.6  80.0 - 100.0 fL Final   MCH 08/25/2023 27.5  26.0 - 34.0 pg Final   MCHC 08/25/2023 33.7  30.0 - 36.0 g/dL Final   RDW 78/29/5621 13.1  11.5 - 15.5 % Final   Platelets 08/25/2023 282  150 - 400 K/uL Final   nRBC 08/25/2023 0.0  0.0 - 0.2 % Final   Neutrophils Relative % 08/25/2023 68  % Final   Neutro Abs 08/25/2023 6.1  1.7 - 7.7 K/uL Final   Lymphocytes Relative  08/25/2023 23  % Final   Lymphs Abs 08/25/2023 2.1  0.7 - 4.0 K/uL Final   Monocytes Relative 08/25/2023 5  % Final   Monocytes Absolute 08/25/2023 0.5  0.1 - 1.0 K/uL Final   Eosinophils Relative 08/25/2023 3  % Final   Eosinophils Absolute 08/25/2023 0.3  0.0 - 0.5 K/uL Final   Basophils Relative 08/25/2023 1  % Final   Basophils Absolute 08/25/2023 0.1   0.0 - 0.1 K/uL Final   Immature Granulocytes 08/25/2023 0  % Final   Abs Immature Granulocytes 08/25/2023 0.02  0.00 - 0.07 K/uL Final   Performed at [facility], [address], [place], [place] [address]   Hgb A1c MFr Bld 08/25/2023 9.4 (H)  4.8 - 5.6 % Final   Comment: (NOTE) Diagnosis of Diabetes The following HbA1c ranges recommended by the American Diabetes Association (ADA) may be used as an aid in the diagnosis of diabetes mellitus.  Hemoglobin             Suggested A1C NGSP%              Diagnosis  <5.7                   Non Diabetic  5.7-6.4                Pre-Diabetic  >6.4                   Diabetic  <7.0                   Glycemic control for                       adults with diabetes.     Mean Plasma Glucose 08/25/2023 223.08  mg/dL Final   Performed at [facility], [address] [place], [place] [address]   Color, Urine 08/25/2023 YELLOW  YELLOW Final   APPearance 08/25/2023 TURBID (A)  CLEAR Final   Specific Gravity, Urine 08/25/2023 1.028  1.005 - 1.030 Final   pH 08/25/2023 5.0  5.0 - 8.0 Final   Glucose, UA 08/25/2023 50 (A)  NEGATIVE mg/dL Final   Hgb urine dipstick 08/25/2023 NEGATIVE  NEGATIVE Final   Bilirubin Urine 08/25/2023 NEGATIVE  NEGATIVE Final   Ketones, ur 08/25/2023 5 (A)  NEGATIVE mg/dL Final   Protein, ur 56/43/3295 30 (A)  NEGATIVE mg/dL Final   Nitrite 18/84/1660 NEGATIVE  NEGATIVE Final   Leukocytes,Ua 08/25/2023 NEGATIVE  NEGATIVE Final   RBC / HPF 08/25/2023 6-10  0 - 5 RBC/hpf Final   WBC, UA 08/25/2023 0-5  0 - 5 WBC/hpf Final   Bacteria, UA 08/25/2023 FEW (A)  NONE SEEN Final   Squamous Epithelial / HPF 08/25/2023 0-5  0 - 5 /HPF Final   Mucus 08/25/2023 PRESENT   Final   Performed at [facility], [address], [place], [place] [address]   POC Amphetamine UR 08/25/2023 None Detected  NONE DETECTED (Cut Off Level 1000 ng/mL) Final   POC Secobarbital (BAR) 08/25/2023 None Detected  NONE DETECTED (Cut Off  Level 300 ng/mL) Final   POC Buprenorphine (BUP) 08/25/2023 None Detected  NONE DETECTED (Cut Off Level 10 ng/mL) Final   POC Oxazepam (BZO) 08/25/2023 None Detected  NONE DETECTED (Cut Off Level 300 ng/mL) Final   POC Cocaine UR 08/25/2023 None Detected  NONE DETECTED (Cut Off Level 300 ng/mL) Final   POC  Methamphetamine UR 08/25/2023 None Detected  NONE DETECTED (Cut Off Level 1000 ng/mL) Final   POC Morphine 08/25/2023 None Detected  NONE DETECTED (Cut Off Level 300 ng/mL) Final   POC Methadone UR 08/25/2023 None Detected  NONE DETECTED (Cut Off Level 300 ng/mL) Final   POC Oxycodone  UR 08/25/2023 None Detected  NONE DETECTED (Cut Off Level 100 ng/mL) Final   POC Marijuana UR 08/25/2023 None Detected  NONE DETECTED (Cut Off Level 50 ng/mL) Final    Allergies: Other  Medications:  Facility Ordered Medications  Medication   sertraline  (ZOLOFT ) tablet 50 mg   QUEtiapine  (SEROQUEL ) tablet 100 mg   [START ON 08/26/2023] atorvastatin  (LIPITOR) tablet 40 mg   metFORMIN  (GLUCOPHAGE ) tablet 1,000 mg   [START ON 08/26/2023] pantoprazole  (PROTONIX ) EC tablet 40 mg   [START ON 08/26/2023] glipiZIDE  (GLUCOTROL  XL) 24 hr tablet 5 mg   [START ON 08/26/2023] sertraline  (ZOLOFT ) tablet 100 mg   amLODipine  (NORVASC ) tablet 5 mg   And   benazepril  (LOTENSIN ) tablet 40 mg   acetaminophen  (TYLENOL ) tablet 650 mg   alum & mag hydroxide-simeth (MAALOX/MYLANTA) 200-200-20 MG/5ML suspension 30 mL   magnesium  hydroxide (MILK OF MAGNESIA) suspension 30 mL   haloperidol  (HALDOL ) tablet 5 mg   And   diphenhydrAMINE  (BENADRYL ) capsule 50 mg   haloperidol  lactate (HALDOL ) injection 5 mg   And   diphenhydrAMINE  (BENADRYL ) injection 50 mg   And   LORazepam  (ATIVAN ) injection 2 mg   haloperidol  lactate (HALDOL ) injection 10 mg   And   diphenhydrAMINE  (BENADRYL ) injection 50 mg   And   LORazepam  (ATIVAN ) injection 2 mg   diphenhydrAMINE  (BENADRYL ) capsule 25 mg   hydrOXYzine  (ATARAX ) tablet 25 mg   feeding  supplement (ENSURE ENLIVE / ENSURE PLUS) liquid 237 mL   traZODone  (DESYREL ) tablet 50 mg   PTA Medications  Medication Sig   sertraline  (ZOLOFT ) 50 MG tablet Take 50 mg by mouth daily.   metFORMIN  (GLUCOPHAGE ) 500 MG tablet Take 1 tablet (500 mg total) by mouth 2 (two) times daily. (Patient taking differently: Take 1,000 mg by mouth 2 (two) times daily.)   glipiZIDE  (GLUCOTROL  XL) 5 MG 24 hr tablet Take 1 tablet (5 mg total) by mouth daily.   pantoprazole  (PROTONIX ) 40 MG tablet Take 1 tablet (40 mg total) by mouth daily.   atorvastatin  (LIPITOR) 40 MG tablet Take 1 tablet (40 mg total) by mouth daily.   gabapentin (NEURONTIN) 300 MG capsule Take 600 mg by mouth at bedtime.   lisinopril (ZESTRIL) 40 MG tablet Take 40 mg by mouth daily.       Suicide Risk Assessment  Suicide Risk Assessment: Demographic Factors:  Male, Caucasian, Low socioeconomic status, and Living alone Loss Factors: Loss of significant relationship Historical Factors: Prior suicide attempts and Family history of suicide Risk Reduction Factors:   Positive social support, Positive therapeutic relationship, and Positive coping skills or problem solving skills  CLINICAL FACTORS:   Depression:   Anhedonia Hopelessness Severe  COGNITIVE FEATURES THAT CONTRIBUTE TO RISK:  None    SUICIDE RISK:   Moderate:  Frequent suicidal ideation with limited intensity, and duration, some specificity in terms of plans, no associated intent, good self-control, limited dysphoria/symptomatology, some risk factors present, and identifiable protective factors, including available and accessible social support.  Medical Decision Making   Long Term Goals: Improvement in symptoms so as ready for discharge   Short Term Goals: Patient will verbalize feelings in meetings with treatment team members., Patient will attend at  least of 50% of the groups daily., Pt will complete the PHQ9 on admission, day 3 and discharge., and Patient will  take medications as prescribed daily.  Medications: Mood/anxiety: continue group therapy, milieu therapy, 1:1 evaluation with provider.  Medication management:  Restarted home zoloft 50 mg daily, with plan of increasing to 100 mg daily, for better control of depression Restarted home seroquel 100 mg nightly GERD - restarted home protonix  T2DM - restarted home metformin  and glipizide  HLD - restarted home lipitor  Substance Abuse:  No active substance abuse reported by ptd Nicotine and tobacco use: N/A Medical: PRNs for pain, constipation, indigestion available.  Labs/studies: ordered CBC, CMP, Mag, TSH, UDS, EtOH, UA, EKG Safety and Monitoring: voluntarily admission to [facility]/Observation unit for safety, stabilization and treatment Daily contact with patient to assess and evaluate symptoms and progress in treatment Patient's case to be discussed in multi-disciplinary team meeting Observation Level : q15 minute checks Vital signs: q12 hours Precautions: Routine   Recommendations  Disposition: Patient recommended for admission to inpatient psychiatric services. The patient appears reasonably stabilized for transfer considering the current resources, flow, and capabilities available in the UC at this time, and I doubt any other [facility] requiring further screening and/or treatment in the UC prior to transfer is present.   Baltazar Bonier, MD 08/25/23  3:34 PM

## 2023-08-26 ENCOUNTER — Inpatient Hospital Stay (HOSPITAL_COMMUNITY)
Admission: AD | Admit: 2023-08-26 | Discharge: 2023-08-31 | DRG: 885 | Disposition: A | Discharge status: Home / Self Care | Source: Intra-hospital | Attending: Psychiatry | Admitting: Psychiatry

## 2023-08-26 ENCOUNTER — Encounter (HOSPITAL_COMMUNITY): Payer: Self-pay | Admitting: Student in an Organized Health Care Education/Training Program

## 2023-08-26 ENCOUNTER — Other Ambulatory Visit: Payer: Self-pay

## 2023-08-26 DIAGNOSIS — F419 Anxiety disorder, unspecified: Secondary | ICD-10-CM | POA: Diagnosis present

## 2023-08-26 DIAGNOSIS — E785 Hyperlipidemia, unspecified: Secondary | ICD-10-CM | POA: Diagnosis present

## 2023-08-26 DIAGNOSIS — Z6837 Body mass index (BMI) 37.0-37.9, adult: Secondary | ICD-10-CM | POA: Diagnosis not present

## 2023-08-26 DIAGNOSIS — Z5941 Food insecurity: Secondary | ICD-10-CM

## 2023-08-26 DIAGNOSIS — Z79899 Other long term (current) drug therapy: Secondary | ICD-10-CM | POA: Diagnosis not present

## 2023-08-26 DIAGNOSIS — Z7984 Long term (current) use of oral hypoglycemic drugs: Secondary | ICD-10-CM | POA: Diagnosis not present

## 2023-08-26 DIAGNOSIS — F4310 Post-traumatic stress disorder, unspecified: Secondary | ICD-10-CM | POA: Diagnosis present

## 2023-08-26 DIAGNOSIS — F333 Major depressive disorder, recurrent, severe with psychotic symptoms: Secondary | ICD-10-CM | POA: Diagnosis present

## 2023-08-26 DIAGNOSIS — R45851 Suicidal ideations: Secondary | ICD-10-CM | POA: Diagnosis present

## 2023-08-26 DIAGNOSIS — Z9152 Personal history of nonsuicidal self-harm: Secondary | ICD-10-CM

## 2023-08-26 DIAGNOSIS — Z818 Family history of other mental and behavioral disorders: Secondary | ICD-10-CM | POA: Diagnosis not present

## 2023-08-26 DIAGNOSIS — K219 Gastro-esophageal reflux disease without esophagitis: Secondary | ICD-10-CM | POA: Diagnosis present

## 2023-08-26 DIAGNOSIS — Z8261 Family history of arthritis: Secondary | ICD-10-CM | POA: Diagnosis not present

## 2023-08-26 DIAGNOSIS — Z823 Family history of stroke: Secondary | ICD-10-CM | POA: Diagnosis not present

## 2023-08-26 DIAGNOSIS — Z811 Family history of alcohol abuse and dependence: Secondary | ICD-10-CM

## 2023-08-26 DIAGNOSIS — Z5982 Transportation insecurity: Secondary | ICD-10-CM

## 2023-08-26 DIAGNOSIS — Z9151 Personal history of suicidal behavior: Secondary | ICD-10-CM

## 2023-08-26 DIAGNOSIS — I10 Essential (primary) hypertension: Secondary | ICD-10-CM | POA: Diagnosis present

## 2023-08-26 DIAGNOSIS — E669 Obesity, unspecified: Secondary | ICD-10-CM | POA: Diagnosis present

## 2023-08-26 DIAGNOSIS — F332 Major depressive disorder, recurrent severe without psychotic features: Principal | ICD-10-CM | POA: Diagnosis present

## 2023-08-26 DIAGNOSIS — Z85820 Personal history of malignant melanoma of skin: Secondary | ICD-10-CM

## 2023-08-26 DIAGNOSIS — Z62810 Personal history of physical and sexual abuse in childhood: Secondary | ICD-10-CM | POA: Diagnosis not present

## 2023-08-26 DIAGNOSIS — E119 Type 2 diabetes mellitus without complications: Secondary | ICD-10-CM | POA: Diagnosis present

## 2023-08-26 DIAGNOSIS — Z5986 Financial insecurity: Secondary | ICD-10-CM | POA: Diagnosis not present

## 2023-08-26 DIAGNOSIS — F4312 Post-traumatic stress disorder, chronic: Secondary | ICD-10-CM | POA: Insufficient documentation

## 2023-08-26 MED ORDER — METFORMIN HCL 500 MG PO TABS
1000.0000 mg | ORAL_TABLET | Freq: Two times a day (BID) | ORAL | Status: DC
  Administered 2023-08-26 – 2023-08-31 (×10): 1000 mg via ORAL
  Filled 2023-08-26 (×10): qty 2

## 2023-08-26 MED ORDER — DIPHENHYDRAMINE HCL 50 MG/ML IJ SOLN: 50.0000 mg | Freq: Three times a day (TID) | INTRAMUSCULAR | Status: DC | PRN

## 2023-08-26 MED ORDER — ACETAMINOPHEN 325 MG PO TABS
650.0000 mg | ORAL_TABLET | Freq: Four times a day (QID) | ORAL | Status: DC | PRN
  Administered 2023-08-26 – 2023-08-30 (×5): 650 mg via ORAL
  Filled 2023-08-26 (×5): qty 2

## 2023-08-26 MED ORDER — QUETIAPINE FUMARATE 100 MG PO TABS: 100.0000 mg | ORAL_TABLET | Freq: Every day | ORAL | Status: DC

## 2023-08-26 MED ORDER — BENAZEPRIL HCL 5 MG PO TABS
40.0000 mg | ORAL_TABLET | Freq: Every day | ORAL | Status: DC
  Administered 2023-08-27: 40 mg via ORAL
  Filled 2023-08-26 (×2): qty 8

## 2023-08-26 MED ORDER — METFORMIN HCL 1000 MG PO TABS: 1000.0000 mg | ORAL_TABLET | Freq: Two times a day (BID) | ORAL | Status: DC

## 2023-08-26 MED ORDER — HALOPERIDOL LACTATE 5 MG/ML IJ SOLN: 10.0000 mg | Freq: Three times a day (TID) | INTRAMUSCULAR | Status: DC | PRN

## 2023-08-26 MED ORDER — HYDROXYZINE HCL 25 MG PO TABS
25.0000 mg | ORAL_TABLET | Freq: Three times a day (TID) | ORAL | Status: DC | PRN
  Administered 2023-08-27: 25 mg via ORAL
  Filled 2023-08-26 (×2): qty 1

## 2023-08-26 MED ORDER — GLIPIZIDE ER 2.5 MG PO TB24
5.0000 mg | ORAL_TABLET | Freq: Every day | ORAL | Status: DC
  Administered 2023-08-27 – 2023-08-31 (×5): 5 mg via ORAL
  Filled 2023-08-26 (×5): qty 2

## 2023-08-26 MED ORDER — PANTOPRAZOLE SODIUM 40 MG PO TBEC
40.0000 mg | DELAYED_RELEASE_TABLET | Freq: Every day | ORAL | Status: DC
  Administered 2023-08-27 – 2023-08-31 (×5): 40 mg via ORAL
  Filled 2023-08-26 (×5): qty 1

## 2023-08-26 MED ORDER — HALOPERIDOL 5 MG PO TABS: 5.0000 mg | ORAL_TABLET | Freq: Three times a day (TID) | ORAL | Status: DC | PRN

## 2023-08-26 MED ORDER — TRAZODONE HCL 50 MG PO TABS
50.0000 mg | ORAL_TABLET | Freq: Every evening | ORAL | Status: DC | PRN
  Administered 2023-08-26: 50 mg via ORAL
  Filled 2023-08-26: qty 1

## 2023-08-26 MED ORDER — ENSURE ENLIVE PO LIQD
237.0000 mL | Freq: Three times a day (TID) | ORAL | Status: DC | PRN
  Administered 2023-08-26: 237 mL via ORAL

## 2023-08-26 MED ORDER — LORAZEPAM 2 MG/ML IJ SOLN: 2.0000 mg | Freq: Three times a day (TID) | INTRAMUSCULAR | Status: DC | PRN

## 2023-08-26 MED ORDER — DIPHENHYDRAMINE HCL 25 MG PO CAPS: 50.0000 mg | ORAL_CAPSULE | Freq: Three times a day (TID) | ORAL | Status: DC | PRN

## 2023-08-26 MED ORDER — ATORVASTATIN CALCIUM 40 MG PO TABS
40.0000 mg | ORAL_TABLET | Freq: Every day | ORAL | Status: DC
  Administered 2023-08-27 – 2023-08-31 (×5): 40 mg via ORAL
  Filled 2023-08-26 (×5): qty 1

## 2023-08-26 MED ORDER — HALOPERIDOL LACTATE 5 MG/ML IJ SOLN: 5.0000 mg | Freq: Three times a day (TID) | INTRAMUSCULAR | Status: DC | PRN

## 2023-08-26 MED ORDER — ALUM & MAG HYDROXIDE-SIMETH 200-200-20 MG/5ML PO SUSP: 30.0000 mL | ORAL | Status: DC | PRN

## 2023-08-26 MED ORDER — MAGNESIUM HYDROXIDE 400 MG/5ML PO SUSP: 30.0000 mL | Freq: Every day | ORAL | Status: DC | PRN

## 2023-08-26 MED ORDER — QUETIAPINE FUMARATE 100 MG PO TABS
100.0000 mg | ORAL_TABLET | Freq: Every day | ORAL | Status: DC
  Administered 2023-08-26: 100 mg via ORAL
  Filled 2023-08-26 (×2): qty 1

## 2023-08-26 MED ORDER — SERTRALINE HCL 100 MG PO TABS: 100.0000 mg | ORAL_TABLET | Freq: Every day | ORAL | Status: DC

## 2023-08-26 MED ORDER — SERTRALINE HCL 100 MG PO TABS
100.0000 mg | ORAL_TABLET | Freq: Every day | ORAL | Status: DC
  Administered 2023-08-27: 100 mg via ORAL
  Filled 2023-08-26: qty 1

## 2023-08-26 MED ORDER — AMLODIPINE BESYLATE 5 MG PO TABS
5.0000 mg | ORAL_TABLET | Freq: Every day | ORAL | Status: DC
  Administered 2023-08-27: 5 mg via ORAL
  Filled 2023-08-26: qty 1

## 2023-08-26 NOTE — Progress Notes (Addendum)
 [facility] Admission Note:  [name] is a 63 year old male pt voluntarily admitted to [facility] from [facility] on 08/26/23 due to SI with a plan to OD on fentanyl . Pt reports that his main stressors are that his dog of 17 years passed away last week and his car is currently in the shop so he is unable to work at his job at [employer] which has caused him to sit in his home preoccupied with his thoughts. Pt endorsed passive SI on admission but was able to verbally contract for safety while on the unit. Pt denies HI/AVH on admission. Pt reports that he occasionally experiences AH of voices telling him to hurt himself. Pt denies tobacco or drug use. Pt reports occasional alcohol use reporting that he drank 1 beer last week. Pt's skin assessed, pt has a surgical scar on his right shoulder and several old scars on his face from being attacked by a dog as a child. Pt oriented to the unit and provided with a water cup. Q 15 minute safety checks initiated for safety.

## 2023-08-26 NOTE — Plan of Care (Signed)
   Problem: Education: Goal: Knowledge of [facility] Education information/materials will improve Outcome: Progressing Goal: Emotional status will improve Outcome: Progressing Goal: Mental status will improve Outcome: Progressing

## 2023-08-26 NOTE — Tx Team (Signed)
 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  Wilton Hasting Kavitha Lansdale, RN 08/26/2023, 4:07 PM

## 2023-08-26 NOTE — Plan of Care (Signed)
   Problem: Education: Goal: Knowledge of [facility] Education information/materials will improve Outcome: Progressing Goal: Verbalization of understanding the information provided will improve Outcome: Progressing

## 2023-08-26 NOTE — ED Notes (Signed)
 [name] transferred to [facility] per MD order. Discussed with the patient and all questions fully answered.  An EMTALA and Med Necessity forms were  printed and to be given to the receiving nurse. All belongings returned.  Patient escorted out and transferred via safe transport..  [name]  08/26/2023 3:00 PM

## 2023-08-26 NOTE — Progress Notes (Signed)
 Pt has been accepted to [facility] on 08/26/2023 Bed assignment: 400-2  Pt meets inpatient criteria per: Princess Brooks MD   Attending Physician will be: Dr. Alver Jobs, MD   Report can be called to:  Adult unit: [phone]  Pt can arrive after 2pm   Care Team Notified: [facility] RN, Princess Brooks MD, Randi McCombs RN   [place] Edit Ricciardelli LCSW-A   08/26/2023 1:14 PM

## 2023-08-26 NOTE — Discharge Instructions (Signed)
 You are being transferred to [facility] for a higher level of psychiatric care that is better suited to address your current mental health needs. Our team has made arrangements for your transfer, and you will be under the care of the mental health specialists at [facility] moving forward.  Instructions:  Please follow all recommendations provided by your current care team prior to transfer.  Bring any essential personal items, including identification and a list of current medications if available.  Upon arrival at [facility], inform staff of any known allergies, current medications, or specific concerns you may have related to your care.

## 2023-08-26 NOTE — Progress Notes (Signed)
 Pt is awake, alert and oriented X3. Pt complained of generalized pain. No signs of acute distress noted. PRN Acetaminophen  and scheduled meds administered per order. Pt endorses SI with plan to overdose on Fentanyl . Pt verbally contracts for safety on the unit. Pt advised to notify staff when having intrusive thoughts of hurting self or others. Pt verbalized understanding. Pt denies current HI/AVH. Staff will monitor for pt's safety.

## 2023-08-26 NOTE — Group Note (Signed)
 Date:  08/26/2023 Time:  9:47 PM  Group Topic/Focus:  Wrap-Up Group:   The focus of this group is to help patients review their daily goal of treatment and discuss progress on daily workbooks.    Participation Level:  Did Not Attend  Participation Quality:    Affect:    Cognitive:    Insight:   Engagement in Group:    Modes of Intervention:    Additional Comments:    Dellia Ferguson 08/26/2023, 9:47 PM

## 2023-08-26 NOTE — ED Notes (Signed)
 Report called to Erie Noe, RN [facility].

## 2023-08-26 NOTE — ED Provider Notes (Signed)
 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 He reported the symptoms to his outpatient psychiatrist, who called 911, and patient was escorted via [employer] to this facility.   HPI: Recently, [name] has been obsessing about suicide, particularly since the death of his dog last week, which has intensified his feelings of loss and grief. He reported his suicidal thoughts and plans to his outpatient psychiatrist, who then called 911, leading to his escort by [employer] to the facility. Although [name] owns a firearm, his sister has taken it and stored it in a facility in [place], ensuring he does not have access to it.   The patient reports he lives alone in a boarding house in [place] and reports having no family nearby. However, he has a supportive church community and friends, and his pastor encouraged him to seek help at the facility. Despite his struggles, [name] attributes his faith in god and his friends and church community as a huge source of support.    [name] reports experiencing auditory hallucinations that began last year, describing a male voice, sometimes resembling his own, telling him to kill himself. He is unsure if these are part of his internal monologue but feels they sometimes come from outside the  room. He denies any current or past visual hallucinations, paranoia, or delusional beliefs. His depressive symptoms include low mood, anhedonia, poor concentration, fatigue, psychomotor slowing, feelings of hopelessness, worthlessness, and guilt. He also reports a poor appetite, having not eaten since Monday except for a piece of bread on Wednesday, and difficulty sleeping.   In January, [name] was treated at [facility] in [facility] and attended [facility] for two weeks of residential treatment for alcohol and cannabis use. He was then transferred to [facility] for five weeks due to worsening suicidal ideation. Despite these challenges, [name] remains committed to seeking help and improving his mental health.  Total Time spent with patient: 20 minutes  Past Psychiatric Hx: Current Psychiatrist: Dr. Daphine Eagle Current Therapist: Denies  Psychiatric Dx: treatment resistant depression, MDD,  Current meds: seroquel, zoloft,  Psychiatric medication history: abilify , wellbutrin ,  priror TCA and Maoi use in the 1970s, lithium, lexapro, prozac, celexa, effexor, cymbalta, risperdal Psychiatric medication compliance history: Psychiatric hospitalizations:  reports 10 prior psychiatric hospitalizations Neuromodulation history: denies History of suicide: reports 6 lifetime attempts, 5 attempts via overdose, and 1 was jumping form abuilding. Most recent attempt in January was to overdose on insulin  but police were doing a wellness check and intervened.  NSSIB Hx: reports prior hx of cutting and burning himself when he was younger, he has not doesn't this since he was in college.  History of homicide or aggression: Denies   Past Medical Hx: PCP: Clarisa Crooked, MD Chronic Medical Illness: T2DM, HTN, GERD, HLD Medications/Supplements:metformin , glipizide , pantoprazole , lipitor, lisinopril Allergies: Denies Hospitalizations: Trauma: pt reports prior concussions Seizures:  denies    Substance Use Hx: Alcohol: reports ocassional use, 1 beer every coupel fo weeks. Reports prior hx of abuse. Denies seizures, Dts. Tobacco: quit 30 years ago Cannabis: Quit since Jan 2025 after residential treatment Opioids: pt reports heroin use in HS, reports last sue of opiates 20 years ago Cocaine: reports last use in the 1980s, reports prior hx of abuse Illicit drugs: reports prior hx of of LSD (decades ago), mushrooms (last year)   Rehab hx: [facility] Jan 2025   Family Medical Hx:  Patient reports heart disease runs in the family Father - died of stroke at 40 yo Maternal grandmother - died of MI in his 52s     Family Psychiatric Hx:  Maternal grandmother - completed suicide Paternal great grandfather - completed suicide Paternal uncle - completed suicide   Social Hx: Living situation: Lives in [place] in a boarding house by himself Educational Hx: completed college, bachelors in [place] Occupational Hx: retired, 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 He reports he worked in the [employer], which he found most rewarding.  Legal Hx: No Children: Denies Spiritual Hx: Christian Access to weapons: Denies   Current Medications:  Current Facility-Administered Medications  Medication Dose Route Frequency Provider Last Rate Last Admin   acetaminophen  (TYLENOL ) tablet 650 mg  650 mg Oral Q6H PRN Carrion-Carrero, Jacalyn Martin, MD   650 mg at 08/26/23 0853   alum & mag hydroxide-simeth (MAALOX/MYLANTA) 200-200-20 MG/5ML suspension 30 mL  30 mL Oral Q4H PRN Carrion-Carrero, Jacalyn Martin, MD       amLODipine  (NORVASC ) tablet 5 mg  5 mg Oral Daily Carrion-Carrero, Heyli Min, MD   5 mg at 08/26/23 1027   And   benazepril  (LOTENSIN ) tablet 40 mg  40 mg Oral Daily Carrion-Carrero, Jacalyn Martin, MD   40 mg at 08/26/23 0844   atorvastatin  (LIPITOR) tablet 40 mg  40 mg Oral Daily Carrion-Carrero, Jacalyn Martin, MD   40 mg at 08/26/23 0844    diphenhydrAMINE  (BENADRYL ) capsule 25 mg  25 mg Oral Q6H PRN Carrion-Carrero, Emmery Seiler, MD       haloperidol (HALDOL) tablet 5 mg  5 mg Oral TID PRN Carrion-Carrero, Yocheved Depner, MD       And   diphenhydrAMINE  (BENADRYL ) capsule 50 mg  50 mg Oral TID PRN Carrion-Carrero, Mililani Murthy, MD       haloperidol lactate (HALDOL) injection 5 mg  5 mg Intramuscular TID PRN Carrion-Carrero, Jakie Debow, MD       And   diphenhydrAMINE  (BENADRYL ) injection 50 mg  50 mg Intramuscular TID PRN Carrion-Carrero, Corinthian Kemler, MD       And   LORazepam (ATIVAN) injection 2 mg  2 mg Intramuscular TID PRN Carrion-Carrero, Charmine Bockrath, MD       haloperidol lactate (HALDOL) injection 10 mg  10 mg Intramuscular TID PRN Carrion-Carrero, Hae Ahlers, MD       And   diphenhydrAMINE  (BENADRYL ) injection 50 mg  50 mg Intramuscular TID PRN Carrion-Carrero, Xian Alves, MD       And   LORazepam (ATIVAN) injection 2 mg  2 mg Intramuscular TID PRN Carrion-Carrero, Teriah Muela, MD       feeding supplement (ENSURE ENLIVE / ENSURE PLUS) liquid 237 mL  237 mL Oral TID BM PRN Carrion-Carrero, Hedi Barkan, MD   237 mL at 08/26/23 0845   glipiZIDE  (GLUCOTROL  XL) 24 hr tablet 5 mg  5 mg Oral Daily Carrion-Carrero, Kylynn Street, MD   5 mg at 08/26/23 0844   hydrOXYzine  (ATARAX ) tablet 25 mg  25 mg Oral TID PRN Carrion-Carrero, Jacalyn Martin, MD       magnesium  hydroxide (MILK OF MAGNESIA) suspension 30 mL  30 mL Oral Daily PRN Carrion-Carrero, Yonatan Guitron, MD       metFORMIN  (GLUCOPHAGE ) tablet 1,000 mg  1,000 mg Oral BID WC Carrion-Carrero, Ondrea Dow, MD   1,000 mg at 08/26/23 0844   pantoprazole  (PROTONIX ) EC tablet 40 mg  40 mg Oral Daily Carrion-Carrero, Antaeus Karel, MD   40 mg at 08/26/23 0845   QUEtiapine (SEROQUEL) tablet 100 mg  100 mg Oral QHS Carrion-Carrero, Payten Beaumier, MD   100 mg at 08/25/23 2122   sertraline (ZOLOFT) tablet 100 mg  100 mg Oral Daily Carrion-Carrero, Jacalyn Martin, MD   100 mg at 08/26/23 0845   traZODone  (DESYREL ) tablet 50 mg  50 mg Oral QHS PRN Carrion-Carrero, Jacalyn Martin,  MD       Current Outpatient Medications  Medication Sig Dispense Refill   atorvastatin  (LIPITOR) 40 MG tablet Take 1 tablet (40 mg total) by mouth daily. 90 tablet 3   glipiZIDE  (GLUCOTROL  XL) 5 MG 24 hr tablet Take 1 tablet (5 mg total) by mouth daily. 90 tablet 3   lisinopril (ZESTRIL) 40 MG tablet Take 40 mg by mouth daily.     metFORMIN  (GLUCOPHAGE ) 500 MG tablet Take 1 tablet (500 mg total) by mouth 2 (two) times daily. (Patient taking differently: Take 1,000 mg by mouth 2 (two) times daily.) 180 tablet 3   pantoprazole  (PROTONIX ) 40 MG tablet Take 1 tablet (40 mg total) by mouth daily. 90 tablet 3   metFORMIN  (GLUCOPHAGE ) 1000 MG tablet Take 1 tablet (1,000 mg total) by mouth 2 (two) times daily with a meal.     QUEtiapine (SEROQUEL) 100 MG tablet Take 1 tablet (100 mg total) by mouth at bedtime.     [START ON 08/27/2023] sertraline (ZOLOFT) 100 MG tablet Take 1 tablet (100 mg total) by mouth daily.      PTA Medications:  Facility Ordered Medications  Medication   [COMPLETED] sertraline (ZOLOFT) tablet 50 mg   QUEtiapine (SEROQUEL) tablet 100 mg   atorvastatin  (LIPITOR) tablet 40 mg   metFORMIN  (GLUCOPHAGE ) tablet 1,000 mg   pantoprazole  (PROTONIX ) EC tablet 40 mg   glipiZIDE  (GLUCOTROL  XL) 24 hr tablet 5 mg   sertraline (ZOLOFT) tablet 100 mg   amLODipine  (NORVASC ) tablet 5 mg   And   benazepril  (LOTENSIN ) tablet 40 mg   acetaminophen  (TYLENOL ) tablet 650 mg   alum & mag hydroxide-simeth (MAALOX/MYLANTA) 200-200-20 MG/5ML suspension 30 mL   magnesium  hydroxide (MILK OF MAGNESIA) suspension 30 mL   haloperidol (HALDOL) tablet 5 mg   And   diphenhydrAMINE  (BENADRYL ) capsule 50 mg   haloperidol lactate (HALDOL) injection 5 mg   And   diphenhydrAMINE  (BENADRYL ) injection 50 mg   And   LORazepam (ATIVAN) injection 2 mg   haloperidol lactate (HALDOL) injection 10 mg   And   diphenhydrAMINE  (BENADRYL ) injection 50 mg   And   LORazepam (ATIVAN) injection 2 mg    diphenhydrAMINE  (BENADRYL ) capsule 25 mg   hydrOXYzine  (ATARAX ) tablet 25 mg   traZODone  (DESYREL ) tablet 50 mg   feeding supplement (ENSURE ENLIVE / ENSURE PLUS) liquid 237 mL   PTA Medications  Medication Sig   metFORMIN  (GLUCOPHAGE ) 500 MG tablet Take 1 tablet (500 mg total) by mouth 2 (two) times daily. (Patient taking differently: Take 1,000 mg by mouth 2 (two) times daily.)   glipiZIDE  (GLUCOTROL  XL) 5 MG 24 hr tablet Take 1 tablet (5 mg  total) by mouth daily.   pantoprazole  (PROTONIX ) 40 MG tablet Take 1 tablet (40 mg total) by mouth daily.   atorvastatin  (LIPITOR) 40 MG tablet Take 1 tablet (40 mg total) by mouth daily.   lisinopril (ZESTRIL) 40 MG tablet Take 40 mg by mouth daily.   metFORMIN  (GLUCOPHAGE ) 1000 MG tablet Take 1 tablet (1,000 mg total) by mouth 2 (two) times daily with a meal.   [START ON 08/27/2023] sertraline (ZOLOFT) 100 MG tablet Take 1 tablet (100 mg total) by mouth daily.   QUEtiapine (SEROQUEL) 100 MG tablet Take 1 tablet (100 mg total) by mouth at bedtime.       03/10/2023   10:23 AM 02/22/2023    3:28 PM 05/04/2021    1:15 PM  Depression screen PHQ 2/9  Decreased Interest 0 2 0  Down, Depressed, Hopeless 2 2 1   PHQ - 2 Score 2 4 1   Altered sleeping 3 1 0  Tired, decreased energy 1 2 0  Change in appetite 1 1 0  Feeling bad or failure about yourself  3 2 1   Trouble concentrating 1 1 0  Moving slowly or fidgety/restless 0 1 0  Suicidal thoughts 2 2 0  PHQ-9 Score 13 14 2   Difficult doing work/chores Somewhat difficult Somewhat difficult Not difficult at all    Flowsheet Row ED from 08/25/2023 in [facility] ED from 02/22/2023 in [facility] Admission (Discharged) from 12/23/2020 in [place] [facility]  C-SSRS RISK CATEGORY Low Risk High Risk No Risk        Psychiatric Specialty Exam  Presentation  General Appearance:  Appropriate for Environment  Eye  Contact: Good  Speech: Clear and Coherent; Normal Rate  Speech Volume: Normal     Mood and Affect  Mood: Depressed  Affect: Congruent; Full Range; Depressed   Thought Process  Thought Processes: Linear  Descriptions of Associations:Intact  Orientation:None  Thought Content:Logical  Diagnosis of Schizophrenia or Schizoaffective disorder in past: No    Hallucinations:Hallucinations: None  Ideas of Reference:None  Suicidal Thoughts:Suicidal Thoughts: Yes, Passive SI Active Intent and/or Plan: Without Intent; Without Plan  Homicidal Thoughts:Homicidal Thoughts: No   Sensorium  Memory: Immediate Good; Recent Good; Remote Good  Judgment: Fair  Insight: Fair   Art therapist  Concentration: Good  Attention Span: Good  Recall: Good  Fund of Knowledge: Good  Language: Good   Psychomotor Activity  Psychomotor Activity: Psychomotor Activity: Normal   Assets  Assets: Desire for Improvement; Resilience; Communication Skills   Sleep  Sleep: Sleep: Fair   Nutritional Assessment (For OBS and FBC admissions only) Has the patient had a weight loss or gain of 10 pounds or more in the last 3 months?: No Has the patient had a decrease in food intake/or appetite?: No Does the patient have dental problems?: No Does the patient have eating habits or behaviors that may be indicators of an eating disorder including binging or inducing vomiting?: No Has the patient recently lost weight without trying?: 0 Has the patient been eating poorly because of a decreased appetite?: 0 Malnutrition Screening Tool Score: 0    Physical Exam  Physical Exam Vitals and nursing note reviewed.  Constitutional:      General: He is not in acute distress.    Appearance: He is not ill-appearing.  HENT:     Head: Normocephalic and atraumatic.  Eyes:     Extraocular Movements: Extraocular movements intact.     Conjunctiva/sclera: Conjunctivae normal.  Pulmonary:     Effort: Pulmonary effort is normal. No respiratory distress.  Musculoskeletal:        General: Normal range of motion.  Skin:    General: Skin is warm and dry.  Neurological:     General: No focal deficit present.    Review of Systems  All other systems reviewed and are negative.  Blood pressure 137/88, pulse 74, temperature 98.3 F (36.8 C), resp. rate 18, SpO2 97%. There is no height or weight on file to calculate BMI.  Suicide Risk Assessment: Demographic Factors:  Male, Caucasian, Low socioeconomic status, and Living alone Loss Factors: Loss of significant relationship Historical Factors: Prior suicide attempts and Family history of suicide Risk Reduction Factors:   Positive social support, Positive therapeutic relationship, and Positive coping skills or problem solving skills   CLINICAL FACTORS:   Depression:   Anhedonia Hopelessness Severe   COGNITIVE FEATURES THAT CONTRIBUTE TO RISK:  None     SUICIDE RISK:   Moderate:  Frequent suicidal ideation with limited intensity, and duration, some specificity in terms of plans, no associated intent, good self-control, limited dysphoria/symptomatology, some risk factors present, and identifiable protective factors, including available and accessible social support.  Plan Of Care/Follow-up recommendations:  Activity: as tolerated  Diet: heart healthy  Other: -Follow-up with your outpatient psychiatric provider -instructions on appointment date, time, and address (location) are provided to you in discharge paperwork.  -Take your psychiatric medications as prescribed at discharge - instructions are provided to you in the discharge paperwork   Disposition: [facility]  Baltazar Bonier, MD 08/26/2023, 2:50 PM

## 2023-08-26 NOTE — Group Note (Signed)
 Date:  08/26/2023 Time:  8:55 PM  Group Topic/Focus:  Wrap-Up Group:   The focus of this group is to help patients review their daily goal of treatment and discuss progress on daily workbooks.    Additional Comments:  Pt was encouraged, but opted out of attending the AA meeting this evening.   Eligah Grow 08/26/2023, 8:55 PM

## 2023-08-26 NOTE — ED Notes (Signed)
 Patient observed/assessed in bed/chair resting quietly appearing in no distress and verbalizing no complaints at this time. Will continue to monitor.

## 2023-08-27 DIAGNOSIS — F4310 Post-traumatic stress disorder, unspecified: Secondary | ICD-10-CM | POA: Insufficient documentation

## 2023-08-27 MED ORDER — TRAZODONE HCL 50 MG PO TABS
50.0000 mg | ORAL_TABLET | Freq: Every day | ORAL | Status: DC
  Administered 2023-08-27 – 2023-08-30 (×4): 50 mg via ORAL
  Filled 2023-08-27 (×4): qty 1

## 2023-08-27 MED ORDER — ARIPIPRAZOLE 5 MG PO TABS
5.0000 mg | ORAL_TABLET | Freq: Every day | ORAL | Status: DC
  Administered 2023-08-29 – 2023-08-30 (×2): 5 mg via ORAL
  Filled 2023-08-27 (×2): qty 1

## 2023-08-27 MED ORDER — SERTRALINE HCL 50 MG PO TABS
150.0000 mg | ORAL_TABLET | Freq: Every day | ORAL | Status: DC
  Administered 2023-08-28 – 2023-08-31 (×4): 150 mg via ORAL
  Filled 2023-08-27 (×4): qty 1

## 2023-08-27 MED ORDER — ARIPIPRAZOLE 2 MG PO TABS
2.0000 mg | ORAL_TABLET | Freq: Every day | ORAL | Status: AC
  Administered 2023-08-27 – 2023-08-28 (×2): 2 mg via ORAL
  Filled 2023-08-27 (×2): qty 1

## 2023-08-27 NOTE — Group Note (Signed)
 LCSW Group Therapy Note   Date/Time:  @TD @ 11:30am-12:00pm  Type of Therapy and Topic:  Group Therapy:  commitment  Participation Level:  Active   Description of Group This process group involved a discussion with and between patients about commitment.   That is, much change occurs by making a plan to do something and then doing it, no matter how one feels at the time. , then examples were elicited from group members for the payoff is that self-esteem builds with accomplishment.  This then was followed by a discussion about how the patient are committed to their self, what it is, how important it is, and why we choose the coping techniques we choose.  Participants were encouraged to think about commitment as necessary and positive.  Therapeutic Goals Patient will identify and describe one commitment Patient will participate in generating ideas about being committed to themselves and how it feels when you accomplishment the commitment What is the patient self-esteem level and how they feel about themself The patient are encourage to commit on a work sheet listing their promise and how it will get accomplish  Summary of Patient Progress:  The patient expressed that he is committed to being and Programmer, multimedia and a Clinical research associate. The patient share that he feels good when he finish a promise to himself and his self esteem is good. The patient share that he does not get depress and work on things more.    Therapeutic Modalities Brief Solution-Focused Therapy Psychoeducation  Alta Shober O Davanee Klinkner, LCSWA 08/27/2023  4:50 PM

## 2023-08-27 NOTE — Plan of Care (Signed)

## 2023-08-27 NOTE — Progress Notes (Signed)
   08/27/23 2000  Psych Admission Type (Psych Patients Only)  Admission Status Voluntary  Psychosocial Assessment  Patient Complaints Depression  Eye Contact Fair  Facial Expression Animated  Affect Appropriate to circumstance  Speech Logical/coherent  Interaction Assertive  Motor Activity Slow  Appearance/Hygiene Unremarkable  Behavior Characteristics Cooperative;Appropriate to situation  Mood Depressed  Thought Process  Coherency WDL  Content WDL  Delusions None reported or observed  Perception WDL  Hallucination None reported or observed  Judgment Impaired  Confusion None  Danger to Self  Current suicidal ideation? Denies  Description of Suicide Plan none  Self-Injurious Behavior No self-injurious ideation or behavior indicators observed or expressed   Agreement Not to Harm Self Yes  Description of Agreement verbal  Danger to Others  Danger to Others None reported or observed

## 2023-08-27 NOTE — Plan of Care (Signed)
   Problem: Education: Goal: Knowledge of [facility] Education information/materials will improve Outcome: Progressing Goal: Verbalization of understanding the information provided will improve Outcome: Progressing
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
## 2023-08-27 NOTE — Group Note (Signed)
 Date:  08/27/2023 Time:  9:13 AM  Group Topic/Focus:  Goals Group:   The focus of this group is to help patients establish daily goals to achieve during treatment and discuss how the patient can incorporate goal setting into their daily lives to aide in recovery.    Participation Level:  Did Not Attend    Vallarie Gauze 08/27/2023, 9:13 AM

## 2023-08-27 NOTE — Progress Notes (Addendum)
 D. Pt presents with a sad affect, depressed mood- reported feeling "sad" today- endorsed SI without plan- agreed to contract staff before acting on harmful thoughts. Pt has been visible in the milieu, observed attending group. Pt denies HI and A/VH. A. Labs and vitals monitored. Pt given and educated on medications. Pt supported emotionally and encouraged to express concerns and ask questions.   R. Pt remains safe with 15 minute checks. Will continue POC.    08/27/23 1400  Psych Admission Type (Psych Patients Only)  Admission Status Voluntary  Psychosocial Assessment  Patient Complaints Depression  Eye Contact Fair  Facial Expression Sad  Affect Appropriate to circumstance  Speech Logical/coherent  Interaction Assertive  Motor Activity Slow  Appearance/Hygiene Unremarkable  Behavior Characteristics Cooperative;Aggressive physically;Calm  Mood Depressed  Thought Process  Coherency WDL  Content WDL  Delusions None reported or observed  Perception WDL  Hallucination None reported or observed  Judgment Impaired  Confusion None  Danger to Self  Current suicidal ideation? Passive  Self-Injurious Behavior Some self-injurious ideation observed or expressed.  No lethal plan expressed   Agreement Not to Harm Self Yes  Description of Agreement agreed to contact staff before acting on harmful thoughts  Danger to Others  Danger to Others None reported or observed     08/27/23 1400  Psych Admission Type (Psych Patients Only)  Admission Status Voluntary  Psychosocial Assessment  Patient Complaints Depression  Eye Contact Fair  Facial Expression Sad  Affect Appropriate to circumstance  Speech Logical/coherent  Interaction Assertive  Motor Activity Slow  Appearance/Hygiene Unremarkable  Behavior Characteristics Cooperative;Aggressive physically;Calm  Mood Depressed  Thought Process  Coherency WDL  Content WDL  Delusions None reported or observed  Perception WDL  Hallucination  None reported or observed  Judgment Impaired  Confusion None  Danger to Self  Current suicidal ideation? Passive  Self-Injurious Behavior Some self-injurious ideation observed or expressed.  No lethal plan expressed   Agreement Not to Harm Self Yes  Description of Agreement agreed to contact staff before acting on harmful thoughts  Danger to Others  Danger to Others None reported or observed

## 2023-08-27 NOTE — H&P (Addendum)
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 other chronic and acute stressors, which contributed to the patient's worsening depression, SI with plan to overdose, in order to reduce the risk of self-harm at discharge.   Physician Treatment Plan for Primary Diagnosis: MDD (major depressive disorder), recurrent episode, severe (HCC) Long Term Goal(s): Improvement in symptoms so as ready for discharge  Short Term Goals: Ability to identify changes in lifestyle to reduce recurrence of condition will improve, Ability to verbalize feelings  will improve, Ability to disclose and discuss suicidal ideas, Ability to demonstrate self-control will improve, and Ability to identify and develop effective coping behaviors will improve   I certify that inpatient services furnished can reasonably be expected to improve the patient's condition.    Total Time Spent  in Direct Patient Care:  I personally spent 55 minutes on the unit in direct patient care. The direct patient care time included face-to-face time with the patient, reviewing the patient's chart, communicating with other professionals, and coordinating care. Greater than 50% of this time was spent in counseling or coordinating care with the patient regarding goals of hospitalization, psycho-education, and discharge planning needs.   Diantha Paxson Linnie Riches, MD 5/31/202511:16 AM

## 2023-08-27 NOTE — [facility] Group Notes (Signed)
 [facility] Group Notes:  (Nursing/MHT/Case Management/Adjunct)  Date:  08/27/2023  Time:  2000  Type of Therapy:  Wrap up group  Participation Level:  Active  Participation Quality:  Appropriate and Attentive  Affect:  Depressed and Flat  Cognitive:  Lacking  Insight:  Limited  Engagement in Group:  Developing/Improving  Modes of Intervention:  Clarification, Education, and Socialization  Summary of Progress/Problems: Positive thinking and self-care were discussed.   Catharine Clock 08/27/2023, 9:29 PM

## 2023-08-27 NOTE — [facility] Suicide Risk Assessment (Signed)
 [facility] Admission Suicide Risk Assessment   Nursing information obtained from:  Patient Demographic factors:  Male, Unemployed Current Mental Status:  Suicidal ideation indicated by patient Loss Factors:  Loss of significant relationship Historical Factors:  Prior suicide attempts Risk Reduction Factors:  Religious beliefs about death  Total Time spent with patient: 1 hour Principal Problem: MDD (major depressive disorder), recurrent episode, severe (HCC) Diagnosis:  Principal Problem:   MDD (major depressive disorder), recurrent episode, severe (HCC)  Subjective Data: See H&P  Continued Clinical Symptoms:  Alcohol Use Disorder Identification Test Final Score (AUDIT): 0 The "Alcohol Use Disorders Identification Test", Guidelines for Use in Primary Care, Second Edition.  World Science writer [facility]). Score between 0-7:  no or low risk or alcohol related problems. Score between 8-15:  moderate risk of alcohol related problems. Score between 16-19:  high risk of alcohol related problems. Score 20 or above:  warrants further diagnostic evaluation for alcohol dependence and treatment.   CLINICAL FACTORS:   Depression:   Anhedonia Delusional Hopelessness Impulsivity Insomnia Severe Command auditory hallucinations  Musculoskeletal: Strength & Muscle Tone: within normal limits Gait & Station: normal Patient leans: N/A  Psychiatric Specialty Exam:  Presentation  General Appearance:  Appropriate for Environment  Eye Contact: Good  Speech: Clear and Coherent; Normal Rate  Speech Volume: Normal  Handedness: Right   Mood and Affect  Mood: Depressed  Affect: Congruent; Full Range; Depressed   Thought Process  Thought Processes: Linear  Descriptions of Associations:Intact  Orientation:None  Thought Content:Logical  History of Schizophrenia/Schizoaffective disorder:No  Duration of Psychotic Symptoms:No data recorded Hallucinations:Hallucinations:  None  Ideas of Reference:None  Suicidal Thoughts:Suicidal Thoughts: Yes, Passive SI Active Intent and/or Plan: Without Intent; Without Plan  Homicidal Thoughts:Homicidal Thoughts: No   Sensorium  Memory: Immediate Good; Recent Good; Remote Good  Judgment: Fair  Insight: Fair   Art therapist  Concentration: Good  Attention Span: Good  Recall: Good  Fund of Knowledge: Good  Language: Good   Psychomotor Activity  Psychomotor Activity: Psychomotor Activity: Normal   Assets  Assets: Desire for Improvement; Resilience; Communication Skills   Sleep  Sleep: Sleep: Fair    Physical Exam: Physical Exam ROS Blood pressure (!) 124/91, pulse 76, temperature 97.9 F (36.6 C), temperature source Oral, resp. rate 16, height 5\' 8"  (1.727 m), weight 112.5 kg, SpO2 96%. Body mass index is 37.71 kg/m.   COGNITIVE FEATURES THAT CONTRIBUTE TO RISK:  None    SUICIDE RISK:   Moderate:  Frequent suicidal ideation with limited intensity, and duration, some specificity in terms of plans, no associated intent, good self-control, limited dysphoria/symptomatology, some risk factors present, and identifiable protective factors, including available and accessible social support.  PLAN OF CARE: See H&P  I certify that inpatient services furnished can reasonably be expected to improve the patient's condition.   Ngoc Daughtridge Linnie Riches, MD 08/27/2023, 11:05 AM

## 2023-08-28 LAB — GLUCOSE, CAPILLARY
Glucose-Capillary: 163 mg/dL — ABNORMAL HIGH (ref 70–99)
Glucose-Capillary: 151 mg/dL — ABNORMAL HIGH (ref 70–99)
Glucose-Capillary: 126 mg/dL — ABNORMAL HIGH (ref 70–99)

## 2023-08-28 MED ORDER — INSULIN ASPART 100 UNIT/ML IJ SOLN
0.0000 [IU] | Freq: Three times a day (TID) | INTRAMUSCULAR | Status: DC
  Administered 2023-08-28 – 2023-08-29 (×5): 4 [IU] via SUBCUTANEOUS
  Administered 2023-08-30 (×2): 7 [IU] via SUBCUTANEOUS
  Administered 2023-08-31: 4 [IU] via SUBCUTANEOUS

## 2023-08-28 MED ORDER — BENAZEPRIL HCL 20 MG PO TABS: 40.0000 mg | ORAL_TABLET | Freq: Every day | ORAL | Status: DC

## 2023-08-28 MED ORDER — BENAZEPRIL HCL 20 MG PO TABS
40.0000 mg | ORAL_TABLET | Freq: Every day | ORAL | Status: DC
  Administered 2023-08-28 – 2023-08-31 (×4): 40 mg via ORAL
  Filled 2023-08-28 (×2): qty 2

## 2023-08-28 MED ORDER — LIVING WELL WITH DIABETES BOOK
Freq: Once | Status: AC
  Filled 2023-08-28: qty 1

## 2023-08-28 NOTE — Group Note (Signed)
 Date:  08/28/2023 Time:  9:36 AM  Group Topic/Focus:  Goals Group:   The focus of this group is to help patients establish daily goals to achieve during treatment and discuss how the patient can incorporate goal setting into their daily lives to aide in recovery. Orientation:   The focus of this group is to educate the patient on the purpose and policies of crisis stabilization and provide a format to answer questions about their admission.  The group details unit policies and expectations of patients while admitted.    Participation Level:  Active  Participation Quality:  Appropriate  Affect:  Appropriate  Cognitive:  Appropriate  Insight: Appropriate  Engagement in Group:  Engaged  Modes of Intervention:  Discussion and Orientation  Additional Comments:    [name] Ralph D Keyan Folson 08/28/2023, 9:36 AM

## 2023-08-28 NOTE — Group Note (Signed)
 Date:  08/28/2023 Time:  5:51 PM  Group Topic/Focus:  Rediscovering Joy:   The focus of this group is to explore various ways to relieve stress in a positive manner. (Patients created a 'life collage')   Cultivating creativity can help synthesize knowledge, experiences, frustrations, and support our mental health   Participation Level:  Active  Participation Quality:  Appropriate and Attentive  Affect:  Appropriate  Cognitive:  Alert and Appropriate  Insight: Appropriate  Engagement in Group:  Engaged  Modes of Intervention:  Activity, Exploration, Rapport Building, Socialization, and Support  Additional Comments:    Victorine Grater 08/28/2023, 5:51 PM
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
## 2023-08-28 NOTE — Inpatient Diabetes Management (Signed)
 Inpatient Diabetes Program Recommendations  AACE/ADA: New Consensus Statement on Inpatient Glycemic Control (2015)  Target Ranges:  Prepandial:   less than 140 mg/dL      Peak postprandial:   less than 180 mg/dL (1-2 hours)      Critically ill patients:  140 - 180 mg/dL   Lab Results  Component Value Date   GLUCAP 232 (H) 12/24/2020   HGBA1C 9.4 (H) 08/25/2023    Review of Glycemic Control  Diabetes history: DM2 Outpatient Diabetes medications: Glucotrol  5 mg daily, metformin  1 gm bid Current orders for Inpatient glycemic control: Glucotrol  5 mg daily, metformin  1 gm bid  Inpatient Diabetes Program Recommendations:   Received consult regarding diabetes management. Please consider: -Glycemic control order set with 0-9 units tid 0-5 units hs correction -Add carb mod to current diet order Ordered Living Well With diabetes for Patient review  Thank you, Norma Beckers. Druanne Bosques, RN, MSN, CDCES  Diabetes Coordinator Inpatient Glycemic Control Team Team Pager [phone] (8am-5pm) 08/28/2023 9:04 AM

## 2023-08-28 NOTE — Group Note (Signed)
 Date:  08/28/2023 Time:  1:06 PM  Group Topic/Focus:   Setbacks in Recovery:   The focus of this group is to identify unhealthy thought patterns and develop a plan to handle them in a healthier way upon discharge.    Participation Level:  Minimal  Participation Quality:  Appropriate  Affect:  Appropriate  Cognitive:  Appropriate  Insight: Appropriate  Engagement in Group:  Developing/Improving  Modes of Intervention:  Discussion and Education  Additional Comments:    Sheryl Donna 08/28/2023, 1:06 PM

## 2023-08-28 NOTE — Progress Notes (Addendum)
 Patient presented much brighter today- reported that his thoughts "are much better than yesterday". Pt has been visible in the milieu throughout the shift, observed attending groups. Pt denied SI/HI and A/VH.   08/28/23 1200  Psych Admission Type (Psych Patients Only)  Admission Status Voluntary  Psychosocial Assessment  Patient Complaints Depression  Eye Contact Fair  Facial Expression Animated  Affect Appropriate to circumstance  Speech Logical/coherent  Interaction Assertive  Motor Activity Slow  Appearance/Hygiene Unremarkable  Behavior Characteristics Cooperative;Appropriate to situation  Mood Depressed  Thought Process  Coherency WDL  Content WDL  Delusions None reported or observed  Perception WDL  Hallucination None reported or observed  Judgment Impaired  Confusion None  Danger to Self  Current suicidal ideation? Denies  Self-Injurious Behavior No self-injurious ideation or behavior indicators observed or expressed   Danger to Others  Danger to Others None reported or observed

## 2023-08-28 NOTE — [facility] Group Notes (Signed)
 [facility] Group Notes:  (Nursing/MHT/Case Management/Adjunct)  Date:  08/28/2023  Time:  2000  Type of Therapy:  Wrap up group  Participation Level:  Active  Participation Quality:  Appropriate, Attentive, Sharing, and Supportive  Affect:  Flat  Cognitive:  Alert  Insight:  Improving  Engagement in Group:  Engaged  Modes of Intervention:  Clarification, Education, and Support  Summary of Progress/Problems: Positive thinking and self-care were discussed.   [name] 08/28/2023, 9:31 PM

## 2023-08-29 ENCOUNTER — Ambulatory Visit: Admitting: Family Medicine

## 2023-08-29 ENCOUNTER — Encounter (HOSPITAL_COMMUNITY): Payer: Self-pay

## 2023-08-29 DIAGNOSIS — F333 Major depressive disorder, recurrent, severe with psychotic symptoms: Principal | ICD-10-CM

## 2023-08-29 LAB — GLUCOSE, CAPILLARY
Glucose-Capillary: 191 mg/dL — ABNORMAL HIGH (ref 70–99)
Glucose-Capillary: 177 mg/dL — ABNORMAL HIGH (ref 70–99)
Glucose-Capillary: 157 mg/dL — ABNORMAL HIGH (ref 70–99)
Glucose-Capillary: 168 mg/dL — ABNORMAL HIGH (ref 70–99)

## 2023-08-29 NOTE — Group Note (Signed)
 Recreation Therapy Group Note   Group Topic:Stress Management  Group Date: 08/29/2023 Start Time: 0935 End Time: 0955 Facilitators: Teandra Harlan-McCall, LRT,CTRS Location: [address] Dayroom   Group Topic: Stress Management  Goal Area(s) Addresses:  Patient will identify positive stress management techniques. Patient will identify benefits of using stress management post d/c.  Behavioral Response:   Intervention: Insight Timer App  Activity: Meditation. LRT played a meditation that focused encouraged patients to envision their purpose for the day. Patients were to sit, focus and visualize how they wanted their purpose to guide their day.    Education:  Stress Management, Discharge Planning.   Education Outcome: Acknowledges Education   Affect/Mood: N/A   Participation Level: Did not attend    Clinical Observations/Individualized Feedback:     Plan: Continue to engage patient in RT group sessions 2-3x/week.   Lindie Roberson-McCall, LRT,CTRS 08/29/2023 12:25 PM

## 2023-08-29 NOTE — Plan of Care (Signed)
  Problem: Activity: Goal: Interest or engagement in activities will improve Outcome: Progressing   Problem: Coping: Goal: Ability to verbalize frustrations and anger appropriately will improve Outcome: Progressing   Problem: Safety: Goal: Periods of time without injury will increase Outcome: Progressing   Problem: Activity: Goal: Interest or engagement in leisure activities will improve Outcome: Progressing
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
## 2023-08-29 NOTE — Plan of Care (Signed)
  Problem: Education: Goal: Emotional status will improve Outcome: Progressing Goal: Mental status will improve Outcome: Progressing Goal: Verbalization of understanding the information provided will improve Outcome: Progressing   Problem: Activity: Goal: Interest or engagement in activities will improve Outcome: Progressing   Problem: Safety: Goal: Periods of time without injury will increase Outcome: Progressing

## 2023-08-29 NOTE — Progress Notes (Signed)
   08/28/23 2253  Psych Admission Type (Psych Patients Only)  Admission Status Voluntary  Psychosocial Assessment  Patient Complaints Depression  Eye Contact Fair  Facial Expression Animated  Affect Appropriate to circumstance  Speech Logical/coherent  Interaction Assertive  Motor Activity Slow  Appearance/Hygiene Unremarkable  Behavior Characteristics Cooperative;Calm  Mood Depressed;Pleasant  Thought Process  Coherency WDL  Content WDL  Delusions None reported or observed  Perception WDL  Hallucination None reported or observed  Judgment Impaired  Confusion None  Danger to Self  Current suicidal ideation? Denies  Agreement Not to Harm Self Yes  Description of Agreement Verbal  Danger to Others  Danger to Others None reported or observed

## 2023-08-29 NOTE — BH IP Treatment Plan (Signed)
 Interdisciplinary Treatment and Diagnostic Plan Update  08/29/2023 Time of Session: 10:20AM [name] MRN: [id number]  Principal Diagnosis: MDD (major depressive disorder), recurrent episode, severe (HCC)  Secondary Diagnoses: Principal Problem:   MDD (major depressive disorder), recurrent episode, severe (HCC) Active Problems:   PTSD (post-traumatic stress disorder)   Current Medications:  Current Facility-Administered Medications  Medication Dose Route Frequency Provider Last Rate Last Admin   acetaminophen  (TYLENOL ) tablet 650 mg  650 mg Oral Q6H PRN Brent, Amanda C, NP   650 mg at 08/28/23 2107   alum & mag hydroxide-simeth (MAALOX/MYLANTA) 200-200-20 MG/5ML suspension 30 mL  30 mL Oral Q4H PRN Brent, Amanda C, NP       ARIPiprazole  (ABILIFY ) tablet 5 mg  5 mg Oral Daily Linnie Riches, Nadir, MD   5 mg at 08/29/23 0845   atorvastatin  (LIPITOR) tablet 40 mg  40 mg Oral Daily Brent, Amanda C, NP   40 mg at 08/29/23 0845   benazepril  (LOTENSIN ) tablet 40 mg  40 mg Oral Daily Linnie Riches, Nadir, MD   40 mg at 08/29/23 4098   haloperidol  (HALDOL ) tablet 5 mg  5 mg Oral TID PRN Brent, Amanda C, NP       And   diphenhydrAMINE  (BENADRYL ) capsule 50 mg  50 mg Oral TID PRN Brent, Amanda C, NP       haloperidol  lactate (HALDOL ) injection 5 mg  5 mg Intramuscular TID PRN Davia Erps, NP       And   diphenhydrAMINE  (BENADRYL ) injection 50 mg  50 mg Intramuscular TID PRN Brent, Amanda C, NP       And   LORazepam  (ATIVAN ) injection 2 mg  2 mg Intramuscular TID PRN Brent, Amanda C, NP       haloperidol  lactate (HALDOL ) injection 10 mg  10 mg Intramuscular TID PRN Davia Erps, NP       And   diphenhydrAMINE  (BENADRYL ) injection 50 mg  50 mg Intramuscular TID PRN Brent, Amanda C, NP       And   LORazepam  (ATIVAN ) injection 2 mg  2 mg Intramuscular TID PRN Brent, Amanda C, NP       glipiZIDE  (GLUCOTROL  XL) 24 hr tablet 5 mg  5 mg Oral Daily Brent, Amanda C, NP   5 mg at 08/29/23 0845    hydrOXYzine  (ATARAX ) tablet 25 mg  25 mg Oral TID PRN Brent, Amanda C, NP   25 mg at 08/27/23 2122   insulin  aspart (novoLOG ) injection 0-20 Units  0-20 Units Subcutaneous TID WC Attiah, Nadir, MD   4 Units at 08/29/23 1242   magnesium  hydroxide (MILK OF MAGNESIA) suspension 30 mL  30 mL Oral Daily PRN Brent, Amanda C, NP       metFORMIN  (GLUCOPHAGE ) tablet 1,000 mg  1,000 mg Oral BID WC Brent, Amanda C, NP   1,000 mg at 08/29/23 0845   pantoprazole  (PROTONIX ) EC tablet 40 mg  40 mg Oral Daily Brent, Amanda C, NP   40 mg at 08/29/23 0845   sertraline  (ZOLOFT ) tablet 150 mg  150 mg Oral Daily Attiah, Nadir, MD   150 mg at 08/29/23 0845   traZODone  (DESYREL ) tablet 50 mg  50 mg Oral QHS Attiah, Nadir, MD   50 mg at 08/28/23 2107   PTA Medications: Medications Prior to Admission  Medication Sig Dispense Refill Last Dose/Taking   atorvastatin  (LIPITOR) 40 MG tablet Take 1 tablet (40 mg total) by mouth daily. 90 tablet 3    glipiZIDE  (  GLUCOTROL  XL) 5 MG 24 hr tablet Take 1 tablet (5 mg total) by mouth daily. 90 tablet 3    lisinopril (ZESTRIL) 40 MG tablet Take 40 mg by mouth daily.      metFORMIN  (GLUCOPHAGE ) 1000 MG tablet Take 1 tablet (1,000 mg total) by mouth 2 (two) times daily with a meal.      metFORMIN  (GLUCOPHAGE ) 500 MG tablet Take 1 tablet (500 mg total) by mouth 2 (two) times daily. (Patient taking differently: Take 1,000 mg by mouth 2 (two) times daily.) 180 tablet 3    pantoprazole  (PROTONIX ) 40 MG tablet Take 1 tablet (40 mg total) by mouth daily. 90 tablet 3    QUEtiapine  (SEROQUEL ) 100 MG tablet Take 1 tablet (100 mg total) by mouth at bedtime.      sertraline  (ZOLOFT ) 100 MG tablet Take 1 tablet (100 mg total) by mouth daily.       Patient Stressors: Traumatic event    Patient Strengths: Average or above average intelligence   Treatment Modalities: Medication Management, Group therapy, Case management,  1 to 1 session with clinician, Psychoeducation, Recreational  therapy.   Physician Treatment Plan for Primary Diagnosis: MDD (major depressive disorder), recurrent episode, severe (HCC) Long Term Goal(s): Improvement in symptoms so as ready for discharge   Short Term Goals: Ability to identify changes in lifestyle to reduce recurrence of condition will improve Ability to verbalize feelings will improve Ability to disclose and discuss suicidal ideas Ability to demonstrate self-control will improve Ability to identify and develop effective coping behaviors will improve  Medication Management: Evaluate patient's response, side effects, and tolerance of medication regimen.  Therapeutic Interventions: 1 to 1 sessions, Unit Group sessions and Medication administration.  Evaluation of Outcomes: Not Progressing  Physician Treatment Plan for Secondary Diagnosis: Principal Problem:   MDD (major depressive disorder), recurrent episode, severe (HCC) Active Problems:   PTSD (post-traumatic stress disorder)  Long Term Goal(s): Improvement in symptoms so as ready for discharge   Short Term Goals: Ability to identify changes in lifestyle to reduce recurrence of condition will improve Ability to verbalize feelings will improve Ability to disclose and discuss suicidal ideas Ability to demonstrate self-control will improve Ability to identify and develop effective coping behaviors will improve     Medication Management: Evaluate patient's response, side effects, and tolerance of medication regimen.  Therapeutic Interventions: 1 to 1 sessions, Unit Group sessions and Medication administration.  Evaluation of Outcomes: Not Progressing   RN Treatment Plan for Primary Diagnosis: MDD (major depressive disorder), recurrent episode, severe (HCC) Long Term Goal(s): Knowledge of disease and therapeutic regimen to maintain health will improve  Short Term Goals: Ability to remain free from injury will improve, Ability to verbalize frustration and anger appropriately  will improve, Ability to demonstrate self-control, Ability to participate in decision making will improve, Ability to verbalize feelings will improve, Ability to disclose and discuss suicidal ideas, Ability to identify and develop effective coping behaviors will improve, and Compliance with prescribed medications will improve  Medication Management: RN will administer medications as ordered by provider, will assess and evaluate patient's response and provide education to patient for prescribed medication. RN will report any adverse and/or side effects to prescribing provider.  Therapeutic Interventions: 1 on 1 counseling sessions, Psychoeducation, Medication administration, Evaluate responses to treatment, Monitor vital signs and CBGs as ordered, Perform/monitor CIWA, COWS, AIMS and Fall Risk screenings as ordered, Perform wound care treatments as ordered.  Evaluation of Outcomes: Not Progressing   LCSW Treatment Plan for  Primary Diagnosis: MDD (major depressive disorder), recurrent episode, severe (HCC) Long Term Goal(s): Safe transition to appropriate next level of care at discharge, Engage patient in therapeutic group addressing interpersonal concerns.  Short Term Goals: Engage patient in aftercare planning with referrals and resources, Increase social support, Increase ability to appropriately verbalize feelings, Increase emotional regulation, Facilitate acceptance of mental health diagnosis and concerns, Facilitate patient progression through stages of change regarding substance use diagnoses and concerns, Identify triggers associated with mental health/substance abuse issues, and Increase skills for wellness and recovery  Therapeutic Interventions: Assess for all discharge needs, 1 to 1 time with Social worker, Explore available resources and support systems, Assess for adequacy in community support network, Educate family and significant other(s) on suicide prevention, Complete Psychosocial  Assessment, Interpersonal group therapy.  Evaluation of Outcomes: Not Progressing   Progress in Treatment: Attending groups: Yes. Patient attends some groups. Participating in groups: Yes. Taking medication as prescribed: Yes. Toleration medication: Yes. Family/Significant other contact made: No, will contact:  pt declined consent to contact family. Patient understands diagnosis: Yes. Discussing patient identified problems/goals with staff: Yes. Medical problems stabilized or resolved: Yes. Denies suicidal/homicidal ideation: Yes. Issues/concerns per patient self-inventory: No.   Patient Goals:  "To learn better coping skills so once I get home I can actually use them and stick to them".  Discharge Plan or Barriers: Home  Reason for Continuation of Hospitalization: Depression Medication stabilization  Estimated Length of Stay: 5-7 days  Last 3 [place] Suicide Severity Risk Score: Flowsheet Row Admission (Current) [facility] from 08/25/2023 in [facility] ED from 02/22/2023 in [facility]  C-SSRS RISK CATEGORY High Risk Low Risk High Risk       Last [facility] 2/9 Scores:    03/10/2023   10:23 AM 02/22/2023    3:28 PM 05/04/2021    1:15 PM  Depression screen PHQ 2/9  Decreased Interest 0 2 0  Down, Depressed, Hopeless 2 2 1   PHQ - 2 Score 2 4 1   Altered sleeping 3 1 0  Tired, decreased energy 1 2 0  Change in appetite 1 1 0  Feeling bad or failure about yourself  3 2 1   Trouble concentrating 1 1 0  Moving slowly or fidgety/restless 0 1 0  Suicidal thoughts 2 2 0  PHQ-9 Score 13 14 2   Difficult doing work/chores Somewhat difficult Somewhat difficult Not difficult at all    Scribe for Treatment Team: Derricka Mertz M Koren Plyler, Milinda Allen 08/29/2023 1:53 PM

## 2023-08-29 NOTE — Group Note (Signed)
 Date:  08/29/2023 Time:  8:32 AM  Group Topic/Focus:  Goals Group:   The focus of this group is to help patients establish daily goals to achieve during treatment and discuss how the patient can incorporate goal setting into their daily lives to aide in recovery.    Participation Level:  Active  Participation Quality:  Appropriate  Affect:  Appropriate   [name] 08/29/2023, 8:32 AM

## 2023-08-29 NOTE — Progress Notes (Signed)
   08/29/23 0800  Psych Admission Type (Psych Patients Only)  Admission Status Voluntary  Psychosocial Assessment  Patient Complaints Depression  Eye Contact Fair  Facial Expression Animated  Affect Appropriate to circumstance  Speech Logical/coherent  Interaction Assertive  Motor Activity Slow  Appearance/Hygiene Unremarkable  Behavior Characteristics Cooperative;Appropriate to situation  Mood Depressed;Pleasant  Thought Process  Coherency WDL  Content WDL  Delusions None reported or observed  Perception WDL  Hallucination None reported or observed  Judgment Poor  Confusion None  Danger to Self  Current suicidal ideation? Denies  Agreement Not to Harm Self Yes  Description of Agreement verbal  Danger to Others  Danger to Others None reported or observed

## 2023-08-29 NOTE — [facility] Suicide Risk Assessment (Signed)
 [facility] INPATIENT:  Family/Significant Other Suicide Prevention Education  Suicide Prevention Education:  Patient Refusal for Family/Significant Other Suicide Prevention Education: The patient [name] has refused to provide written consent for family/significant other to be provided Family/Significant Other Suicide Prevention Education during admission and/or prior to discharge.  Physician notified.  Vonzell Guerin 08/29/2023, 1:18 PM

## 2023-08-29 NOTE — Group Note (Signed)
 Therapy Group Note  Group Topic:Other  Group Date: 08/29/2023 Start Time: 1430 End Time: 1503 Facilitators: Lynnda Sas, OT    The primary objective of this topic is to explore and understand the concept of occupational balance in the context of daily living. The term "occupational balance" is defined broadly, encompassing all activities that occupy an individual's time and energy, including self-care, leisure, and work-related tasks. The goal is to guide participants towards achieving a harmonious blend of these activities, tailored to their personal values and life circumstances. This balance is aimed at enhancing overall well-being, not by equally distributing time across activities, but by ensuring that daily engagements are fulfilling and not draining. The content delves into identifying various barriers that individuals face in achieving occupational balance, such as overcommitment, misaligned priorities, external pressures, and lack of effective time management. The impact of these barriers on occupational performance, roles, and lifestyles is examined, highlighting issues like reduced efficiency, strained relationships, and potential health problems. Strategies for cultivating occupational balance are a key focus. These strategies include practical methods like time blocking, prioritizing tasks, establishing self-care rituals, decluttering, connecting with nature, and engaging in reflective practices. These approaches are designed to be adaptable and applicable to a wide range of life scenarios, promoting a proactive and mindful approach to daily living. The overall aim is to equip participants with the knowledge and tools to create a balanced lifestyle that supports their mental, emotional, and physical health, thereby improving their functional performance in daily life.     Participation Level: Engaged   Participation Quality: Independent   Behavior: Appropriate   Speech/Thought  Process: Relevant   Affect/Mood: Appropriate   Insight: Fair   Judgement: Fair      Modes of Intervention: Education  Patient Response to Interventions:  Attentive   Plan: Continue to engage patient in OT groups 2 - 3x/week.  08/29/2023  Lynnda Sas, OT  Marisha Renier, OT

## 2023-08-29 NOTE — Plan of Care (Signed)
  Problem: Education: Goal: Knowledge of [place] [facility] Education information/materials will improve Outcome: Progressing Goal: Emotional status will improve Outcome: Progressing Goal: Mental status will improve Outcome: Progressing Goal: Verbalization of understanding the information provided will improve Outcome: Progressing  Patient is pleasant denies SI/HI/A/VH and verbally contracts for safety. Complaint with medications no adverse effects noted. Q 15 minutes safety checks ongoing.

## 2023-08-30 DIAGNOSIS — F332 Major depressive disorder, recurrent severe without psychotic features: Secondary | ICD-10-CM | POA: Diagnosis not present

## 2023-08-30 LAB — GLUCOSE, CAPILLARY
Glucose-Capillary: 215 mg/dL — ABNORMAL HIGH (ref 70–99)
Glucose-Capillary: 220 mg/dL — ABNORMAL HIGH (ref 70–99)
Glucose-Capillary: 161 mg/dL — ABNORMAL HIGH (ref 70–99)

## 2023-08-30 MED ORDER — SENNOSIDES-DOCUSATE SODIUM 8.6-50 MG PO TABS
2.0000 | ORAL_TABLET | Freq: Two times a day (BID) | ORAL | Status: DC
  Administered 2023-08-30 – 2023-08-31 (×2): 2 via ORAL
  Filled 2023-08-30 (×2): qty 2

## 2023-08-30 MED ORDER — ARIPIPRAZOLE 10 MG PO TABS
10.0000 mg | ORAL_TABLET | Freq: Every day | ORAL | Status: DC
  Administered 2023-08-31: 10 mg via ORAL
  Filled 2023-08-30: qty 1

## 2023-08-30 MED ORDER — POLYETHYLENE GLYCOL 3350 17 G PO PACK
17.0000 g | PACK | Freq: Every day | ORAL | Status: DC
  Administered 2023-08-30 – 2023-08-31 (×2): 17 g via ORAL
  Filled 2023-08-30 (×2): qty 1

## 2023-08-30 NOTE — Group Note (Signed)
 Date:  08/30/2023 Time:  9:03 AM  Group Topic/Focus:  Goals Group:   The focus of this group is to help patients establish daily goals to achieve during treatment and discuss how the patient can incorporate goal setting into their daily lives to aide in recovery. Orientation:   The focus of this group is to educate the patient on the purpose and policies of crisis stabilization and provide a format to answer questions about their admission.  The group details unit policies and expectations of patients while admitted.    Participation Level:  Active  Participation Quality:  Appropriate  Affect:  Appropriate  Cognitive:  Appropriate  Insight: Appropriate  Engagement in Group:  Engaged  Modes of Intervention:  Orientation  Additional Comments:  Goal is to go to groups  Violette Grief 08/30/2023, 9:03 AM

## 2023-08-30 NOTE — Progress Notes (Signed)
   08/30/23 0900  Psych Admission Type (Psych Patients Only)  Admission Status Voluntary  Psychosocial Assessment  Patient Complaints Depression  Eye Contact Fair  Facial Expression Animated  Affect Appropriate to circumstance  Speech Logical/coherent  Interaction Assertive  Motor Activity Slow  Appearance/Hygiene Unremarkable  Behavior Characteristics Cooperative;Appropriate to situation  Mood Pleasant  Thought Process  Coherency WDL  Content WDL  Delusions None reported or observed  Perception WDL  Hallucination None reported or observed  Judgment Impaired  Confusion None  Danger to Self  Current suicidal ideation? Denies  Agreement Not to Harm Self Yes  Description of Agreement verbal  Danger to Others  Danger to Others None reported or observed
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
## 2023-08-30 NOTE — Plan of Care (Signed)
  Problem: Education: Goal: Emotional status will improve Outcome: Progressing Goal: Mental status will improve Outcome: Progressing Goal: Verbalization of understanding the information provided will improve Outcome: Progressing   Problem: Activity: Goal: Interest or engagement in activities will improve Outcome: Progressing   Problem: Coping: Goal: Ability to verbalize frustrations and anger appropriately will improve Outcome: Progressing   Problem: Health Behavior/Discharge Planning: Goal: Identification of resources available to assist in meeting health care needs will improve Outcome: Progressing

## 2023-08-30 NOTE — Progress Notes (Signed)
 Adult Psychoeducational Group Note  Date:  08/30/2023 Time:  9:08 PM  Group Topic/Focus:  Wrap-Up Group:   The focus of this group is to help patients review their daily goal of treatment and discuss progress on daily workbooks.  Participation Level:  Active  Participation Quality:  Appropriate  Affect:  Appropriate  Cognitive:  Appropriate  Insight: Appropriate  Engagement in Group:  Engaged  Modes of Intervention:  Discussion  Additional Comments:  The one positive thing that happen he went to all groups came out his room today  Caye Cocks 08/30/2023, 9:08 PM

## 2023-08-30 NOTE — [facility] Group Notes (Signed)

## 2023-08-30 NOTE — Group Note (Signed)
 LCSW Group Therapy Note   Group Date: 08/30/2023 Start Time: 1100 End Time: 1200  Participation:  patient was present and actively participated in the discussion.  Type of Therapy:  Group Therapy   Topic: Lifestyle:  from "One Day" to "Today is Day One"  Objective:  To promote mental and physical well-being through lifestyle changes in routine, nutrition, sleep, and movement.  Goals: Increase awareness of how lifestyle habits impact mental health. Encourage one small, achievable wellness goal. Support group sharing and accountability.  Summary:  Group members explored how daily habits influence mental health and discussed the importance of starting with small, manageable changes. Participants identified personal goals and shared reflections on improving structure, sleep, diet, and physical activity.  Therapeutic Modalities: CBT - Identifying and challenging all-or-nothing thinking; promoting realistic, helpful thoughts about change. Psychoeducation - Teaching about the impact of sleep, nutrition, movement, and routine on mental health. Motivational Interviewing - Eliciting personal motivation and exploring readiness for change. Goal-Setting - Supporting SMART goals to build self-efficacy and encourage follow-through.   [name], LCSWA 08/30/2023  12:32 PM

## 2023-08-31 DIAGNOSIS — F332 Major depressive disorder, recurrent severe without psychotic features: Secondary | ICD-10-CM

## 2023-08-31 LAB — GLUCOSE, CAPILLARY: Glucose-Capillary: 155 mg/dL — ABNORMAL HIGH (ref 70–99)

## 2023-08-31 MED ORDER — METFORMIN HCL 1000 MG PO TABS: 1000.0000 mg | ORAL_TABLET | Freq: Two times a day (BID) | ORAL | 0 refills | Status: DC

## 2023-08-31 MED ORDER — PANTOPRAZOLE SODIUM 40 MG PO TBEC
40.0000 mg | DELAYED_RELEASE_TABLET | Freq: Every day | ORAL | 0 refills | Status: DC
  Filled 2023-11-18: qty 30, 30d supply, fill #0

## 2023-08-31 MED ORDER — SERTRALINE HCL 50 MG PO TABS: 150.0000 mg | ORAL_TABLET | Freq: Every day | ORAL | 0 refills | Status: DC

## 2023-08-31 MED ORDER — ARIPIPRAZOLE 10 MG PO TABS: 10.0000 mg | ORAL_TABLET | Freq: Every day | ORAL | 0 refills | Status: DC

## 2023-08-31 MED ORDER — GLIPIZIDE ER 5 MG PO TB24: 5.0000 mg | ORAL_TABLET | Freq: Every day | ORAL | 0 refills | Status: DC

## 2023-08-31 MED ORDER — TRAZODONE HCL 50 MG PO TABS: 50.0000 mg | ORAL_TABLET | Freq: Every evening | ORAL | 0 refills | Status: DC | PRN

## 2023-08-31 MED ORDER — LISINOPRIL 40 MG PO TABS: 40.0000 mg | ORAL_TABLET | Freq: Every day | ORAL | 0 refills | Status: DC

## 2023-08-31 NOTE — Plan of Care (Signed)
   Problem: Education: Goal: Emotional status will improve Outcome: Progressing Goal: Mental status will improve Outcome: Progressing   Problem: Activity: Goal: Interest or engagement in activities will improve Outcome: Progressing Goal: Sleeping patterns will improve Outcome: Progressing

## 2023-08-31 NOTE — Progress Notes (Signed)
 Patient discharged from [facility] on 08/31/23 at 1130. Patient denies SI, plan, and intention. Suicide safety plan completed, reviewed with this RN, given to the patient, and a copy in the chart. Patient denies HI/AVH upon discharge. Patient is alert, oriented, and cooperative. RN provided patient with discharge paperwork and reviewed information with patient. Patient expressed that he understood all of the discharge instructions. Pt was satisfied with belongings returned to him from the locker and at bedside. Discharged patient to [facility] waiting room. Pt's taxi driver awaiting patient in the [facility] waiting room.

## 2023-08-31 NOTE — Progress Notes (Signed)
   08/31/23 0829  Psych Admission Type (Psych Patients Only)  Admission Status Voluntary  Psychosocial Assessment  Patient Complaints None;Other (Comment) ("Excited to DC today")  Eye Contact Fair  Facial Expression Animated  Affect Appropriate to circumstance  Speech Logical/coherent  Interaction Assertive  Motor Activity Slow  Appearance/Hygiene Unremarkable  Behavior Characteristics Cooperative;Appropriate to situation  Mood Pleasant  Thought Process  Coherency WDL  Content WDL  Delusions None reported or observed  Perception WDL  Hallucination None reported or observed  Judgment Impaired  Confusion None  Danger to Self  Current suicidal ideation? Denies  Description of Suicide Plan No plan  Self-Injurious Behavior No self-injurious ideation or behavior indicators observed or expressed   Agreement Not to Harm Self Yes  Description of Agreement Verbal  Danger to Others  Danger to Others None reported or observed

## 2023-08-31 NOTE — Transportation (Signed)
 08/31/2023  [name] DOB: June 27, 1960 MRN: [id number]   RIDER WAIVER AND RELEASE OF LIABILITY  For the purposes of helping with transportation needs, [facility] partners with outside transportation providers (taxi companies, [place], Catering manager.) to give [facility] patients or other approved people the choice of on-demand rides [employer]") to our buildings for non-emergency visits.  By using [employer], I, the person signing this document, on behalf of myself and/or any legal minors (in my care using the [employer]), agree:  Science writer given to me are supplied by independent, outside transportation providers who do not work for, or have any affiliation with, [employer]. [place] is not a transportation company. [facility] has no control over the quality or safety of the rides I get using [employer]. [facility] [facility] has no control over whether any outside ride will happen on time or not. [facility] gives no guarantee on the reliability, quality, safety, or availability on any rides, or that no mistakes will happen. I know and accept that traveling by vehicle (car, truck, SVU, [place] Chi, bus, taxi, etc.) has risks of serious injuries such as disability, being paralyzed, and death. I know and agree the risk of using [employer] is mine alone, and not [employer]. Transport Services are provided "as is" and as are available. The transportation providers are in charge for all inspections and care of the vehicles used to provide these rides. I agree not to take legal action against [facility], its agents, employees, officers, directors, representatives, insurers, attorneys, assigns, successors, subsidiaries, and affiliates at any time for any reasons related directly or indirectly to using [employer]. I also agree not to take legal action against [facility] or its affiliates for any injury, death, or damage to property caused by or related to using  [employer]. I have read this Waiver and Release of Liability, and I understand the terms used in it and their legal meaning. This Waiver is freely and voluntarily given with the understanding that my right (or any legal minors) to legal action against [facility] relating to [employer] is knowingly given up to use these services.   I attest that I read the Ride Waiver and Release of Liability to [name], gave Mr. Lafosse the opportunity to ask questions and answered the questions asked (if any). I affirm that [name] then provided consent for assistance with transportation.

## 2023-08-31 NOTE — Progress Notes (Signed)
  [facility] Adult Case Management Discharge Plan :  Will you be returning to the same living situation after discharge:  Yes,  pt is returning home at discharge At discharge, do you have transportation home?: Yes,  CSW arranged [employer] for 1130 Do you have the ability to pay for your medications: Yes,  pt has active health insurance coverage  Release of information consent forms completed and in the chart;  Patient's signature needed at discharge.  Patient to Follow up at:  Follow-up Information     Daphine Eagle, MD Follow up on 09/13/2023.   Specialty: Psychiatry Why: You have an appointment for medication management services on 09/13/23 at 1:00 pm, Virtual.  You may call to switch to in person. Contact information: [address] [place] [address] [phone]         [facility] Follow up on 09/08/2023.   Why: You have a hospital follow up appointment on  09/08/23 at 8:00 am.  This will be a Virtual telehealth appointment. Contact information: [address]  [address] [place] [address] [phone]                 Next level of care provider has access to [facility] Link:no  Safety Planning and Suicide Prevention discussed: Yes,  completed with patient     Has patient been referred to the Quitline?: Patient does not use tobacco/nicotine products (quit in 1990)  Patient has been referred for addiction treatment: No known substance use disorder.  Vonzell Guerin, LCSWA 08/31/2023, 9:03 AM
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
## 2023-08-31 NOTE — Inpatient Diabetes Management (Signed)
 Inpatient Diabetes Program Recommendations  AACE/ADA: New Consensus Statement on Inpatient Glycemic Control (2015)  Target Ranges:  Prepandial:   less than 140 mg/dL      Peak postprandial:   less than 180 mg/dL (1-2 hours)      Critically ill patients:  140 - 180 mg/dL   Lab Results  Component Value Date   GLUCAP 155 (H) 08/31/2023   HGBA1C 9.4 (H) 08/25/2023    Review of Glycemic Control  Diabetes history: DM2 Outpatient Diabetes medications: Glipizide  5 mg every day, Metformin  1000 mg BID Current orders for Inpatient glycemic control: Novolog  0-20 units TID, Glipizide  5 mg every day, Metformin  1000 mg BID  Received referral for discharge recommendations.  Recommend discharging patient on current home regimen of Glipizide  and Metformin  and follow up with PCP for DM follow up.  Thank you, Hays Lipschutz, MSN, CDCES Diabetes Coordinator Inpatient Diabetes Program [phone] (team pager from 8a-5p)

## 2023-08-31 NOTE — BHH Suicide Risk Assessment (Signed)
 [facility] Discharge Suicide Risk Assessment   Principal Problem: MDD (major depressive disorder), recurrent episode, severe (HCC) Discharge Diagnoses: Principal Problem:   MDD (major depressive disorder), recurrent episode, severe (HCC) Active Problems:   PTSD (post-traumatic stress disorder)   Total Time spent with patient: 30 minutes  Musculoskeletal: Strength & Muscle Tone: within normal limits Gait & Station: normal Patient leans: N/A  Psychiatric Specialty Exam  Presentation  General Appearance:  Appropriate for Environment  Eye Contact: Fair  Speech: Clear and Coherent  Speech Volume: Normal  Handedness: Right   Mood and Affect  Mood: Euthymic  Duration of Depression Symptoms: Greater than two weeks  Affect: Appropriate   Thought Process  Thought Processes: Goal Directed  Descriptions of Associations:Intact  Orientation:Full (Time, Place and Person)  Thought Content:Logical  History of Schizophrenia/Schizoaffective disorder:No  Duration of Psychotic Symptoms:No data recorded Hallucinations:Hallucinations: None  Ideas of Reference:None  Suicidal Thoughts:Suicidal Thoughts: No  Homicidal Thoughts:Homicidal Thoughts: No   Sensorium  Memory: Immediate Fair  Judgment: Fair  Insight: Fair   Art therapist  Concentration: Fair  Attention Span: Fair  Recall: [employer] of Knowledge: Fair  Language: Fair   Psychomotor Activity  Psychomotor Activity: Psychomotor Activity: Normal   Assets  Assets: Communication Skills; Desire for Improvement; Financial Resources/Insurance; Intimacy; Physical Health; Resilience; Social Support   Sleep  Sleep: Sleep: Fair Number of Hours of Sleep: 7   Physical Exam: Physical Exam ROS Blood pressure (!) 160/81, pulse 64, temperature 98 F (36.7 C), temperature source Oral, resp. rate 19, height 5\' 8"  (1.727 m), weight 112.5 kg, SpO2 97%. Body mass index is 37.71  kg/m.  Mental Status Per Nursing Assessment::   On Admission:  Suicidal ideation indicated by patient  Demographic Factors:  Male and Unemployed  Loss Factors: Loss of significant relationship  Historical Factors: Prior suicide attempts  Risk Reduction Factors:   Religious beliefs about death, Positive social support, Positive therapeutic relationship, and Positive coping skills or problem solving skills  Continued Clinical Symptoms:  Previous Psychiatric Diagnoses and Treatments Medical Diagnoses and Treatments/Surgeries  Cognitive Features That Contribute To Risk:  None    Suicide Risk:  Minimal: No identifiable suicidal ideation.  Patients presenting with no risk factors but with morbid ruminations; may be classified as minimal risk based on the severity of the depressive symptoms  Patient denies SI or HI for >48 hours. Denies wanting to be dead and future oriented. SRA complete and acute risk for suicide is low.   [place] Suicide Risk assessment:  1. Do you wish to be dead? NONE REPORTED 2. Have you wished your dead or wished you could go to sleep and not wake up? NONE REPORTED 3.  Have you actually had thoughts of killing yourself?  NONE REPORTED 4.  Have you been thinking about how you might do this?  NONE REPORTED 5.  Have you had these thoughts and some intention of acting on them? NONE REPORTED 6.  Have you started to work out or worked out the details to kill yourself? NONE REPORTED 7.  Do you intend to carry out this plan? NONE REPORTED 8. On a scale of 1-5 with 1 being the least severe and 5 being the most severe answer the following questions place for intensity of ideation. ZERO 9. How many times have you had these thoughts? NONE REPORTED 10. When you have the thoughts how long to the last?  NONE REPORTED 11. Control ability.  Could you or can you stop thinking about killing  herself or wanting to die if you want to?  YES 12. Are there any things anyone or  anything family religion pain of death that stop you from wanting to die or acting on thoughts of committing suicide?  FAMILY 13.  What sort of reason to do have to think about wanting to die or killing yourself? NONE REPORTED 14.Was it to end the pain or stop the way you are feeling in other words you could not go on living with his pain or how you are feeling or was not to get attention revenge or reaction from others?  Or both?  NONE REPORTED  [place] Suicide Risk assessment:  1. Do you wish to be dead? NONE REPORTED 2. Have you wished your dead or wished you could go to sleep and not wake up? NONE REPORTED 3.  Have you actually had thoughts of killing yourself?  NONE REPORTED 4.  Have you been thinking about how you might do this?  NONE REPORTED 5.  Have you had these thoughts and some intention of acting on them? NONE REPORTED 6.  Have you started to work out or worked out the details to kill yourself? NONE REPORTED 7.  Do you intend to carry out this plan? NONE REPORTED 8. On a scale of 1-5 with 1 being the least severe and 5 being the most severe answer the following questions place for intensity of ideation. ZERO 9. How many times have you had these thoughts? NONE REPORTED 10. When you have the thoughts how long to the last?  NONE REPORTED 11. Control ability.  Could you or can you stop thinking about killing herself or wanting to die if you want to?  YES 12. Are there any things anyone or anything family religion pain of death that stop you from wanting to die or acting on thoughts of committing suicide?  FAMILY 13.  What sort of reason to do have to think about wanting to die or killing yourself? NONE REPORTED 14.Was it to end the pain or stop the way you are feeling in other words you could not go on living with his pain or how you are feeling or was not to get attention revenge or reaction from others?  Or both?  NONE REPORTED    Follow-up Information     Daphine Eagle, MD Follow  up on 09/13/2023.   Specialty: Psychiatry Why: You have an appointment for medication management services on 09/13/23 at 1:00 pm, Virtual.  You may call to switch to in person. Contact information: [address] [place] [address] [phone]         [facility] Follow up on 09/08/2023.   Why: You have a hospital follow up appointment on  09/08/23 at 8:00 am.  This will be a Virtual telehealth appointment. Contact information: [address]  [address] [place] [address] [phone]                 Plan Of Care/Follow-up recommendations:  Outpatient psychiatric followup as above - followup with PCP/endocrinology and continue monitoring BG at home as well as blood pressure log   Sherrina Zaugg, MD 08/31/2023, 8:53 AM

## 2023-08-31 NOTE — Progress Notes (Signed)
   08/30/23 2043  Psych Admission Type (Psych Patients Only)  Admission Status Voluntary  Psychosocial Assessment  Patient Complaints Depression  Eye Contact Fair  Facial Expression Animated  Affect Appropriate to circumstance  Speech Logical/coherent  Interaction Assertive  Motor Activity Slow  Appearance/Hygiene Unremarkable  Behavior Characteristics Cooperative;Appropriate to situation  Mood Pleasant  Thought Process  Coherency WDL  Content WDL  Delusions None reported or observed  Perception WDL  Hallucination None reported or observed  Judgment Impaired  Confusion None  Danger to Self  Current suicidal ideation? Denies  Agreement Not to Harm Self Yes  Description of Agreement VERBAL  Danger to Others  Danger to Others None reported or observed

## 2023-09-15 ENCOUNTER — Ambulatory Visit: Admitting: Family Medicine

## 2023-10-20 ENCOUNTER — Ambulatory Visit: Admitting: Family Medicine

## 2023-11-06 ENCOUNTER — Inpatient Hospital Stay
Admission: AD | Admit: 2023-11-06 | Discharge: 2023-11-17 | DRG: 885 | Disposition: A | Discharge status: Home / Self Care | Source: Intra-hospital | Attending: Psychiatry | Admitting: Psychiatry

## 2023-11-06 ENCOUNTER — Ambulatory Visit (HOSPITAL_COMMUNITY)
Admission: EM | Admit: 2023-11-06 | Discharge: 2023-11-06 | Disposition: A | Discharge status: Other Acute Inpatient Hospital | Attending: Nurse Practitioner | Admitting: Nurse Practitioner

## 2023-11-06 DIAGNOSIS — Z9151 Personal history of suicidal behavior: Secondary | ICD-10-CM | POA: Diagnosis not present

## 2023-11-06 DIAGNOSIS — E7800 Pure hypercholesterolemia, unspecified: Secondary | ICD-10-CM | POA: Diagnosis present

## 2023-11-06 DIAGNOSIS — Z7984 Long term (current) use of oral hypoglycemic drugs: Secondary | ICD-10-CM

## 2023-11-06 DIAGNOSIS — R41843 Psychomotor deficit: Secondary | ICD-10-CM | POA: Diagnosis not present

## 2023-11-06 DIAGNOSIS — I10 Essential (primary) hypertension: Secondary | ICD-10-CM | POA: Diagnosis present

## 2023-11-06 DIAGNOSIS — E669 Obesity, unspecified: Secondary | ICD-10-CM | POA: Diagnosis present

## 2023-11-06 DIAGNOSIS — Z87891 Personal history of nicotine dependence: Secondary | ICD-10-CM | POA: Diagnosis not present

## 2023-11-06 DIAGNOSIS — Z79899 Other long term (current) drug therapy: Secondary | ICD-10-CM

## 2023-11-06 DIAGNOSIS — G4700 Insomnia, unspecified: Secondary | ICD-10-CM | POA: Insufficient documentation

## 2023-11-06 DIAGNOSIS — R630 Anorexia: Secondary | ICD-10-CM | POA: Diagnosis present

## 2023-11-06 DIAGNOSIS — E119 Type 2 diabetes mellitus without complications: Secondary | ICD-10-CM | POA: Diagnosis present

## 2023-11-06 DIAGNOSIS — R45851 Suicidal ideations: Secondary | ICD-10-CM | POA: Diagnosis present

## 2023-11-06 DIAGNOSIS — R4589 Other symptoms and signs involving emotional state: Secondary | ICD-10-CM | POA: Insufficient documentation

## 2023-11-06 DIAGNOSIS — Z85820 Personal history of malignant melanoma of skin: Secondary | ICD-10-CM

## 2023-11-06 DIAGNOSIS — F333 Major depressive disorder, recurrent, severe with psychotic symptoms: Secondary | ICD-10-CM | POA: Diagnosis present

## 2023-11-06 DIAGNOSIS — Z8261 Family history of arthritis: Secondary | ICD-10-CM

## 2023-11-06 DIAGNOSIS — Z5900 Homelessness unspecified: Secondary | ICD-10-CM

## 2023-11-06 DIAGNOSIS — Z5941 Food insecurity: Secondary | ICD-10-CM | POA: Diagnosis not present

## 2023-11-06 DIAGNOSIS — Z823 Family history of stroke: Secondary | ICD-10-CM

## 2023-11-06 DIAGNOSIS — Z818 Family history of other mental and behavioral disorders: Secondary | ICD-10-CM | POA: Diagnosis not present

## 2023-11-06 DIAGNOSIS — Z5982 Transportation insecurity: Secondary | ICD-10-CM

## 2023-11-06 DIAGNOSIS — Z9152 Personal history of nonsuicidal self-harm: Secondary | ICD-10-CM

## 2023-11-06 DIAGNOSIS — F419 Anxiety disorder, unspecified: Secondary | ICD-10-CM | POA: Diagnosis present

## 2023-11-06 DIAGNOSIS — Z5948 Other specified lack of adequate food: Secondary | ICD-10-CM | POA: Diagnosis not present

## 2023-11-06 DIAGNOSIS — Z811 Family history of alcohol abuse and dependence: Secondary | ICD-10-CM

## 2023-11-06 DIAGNOSIS — Z5986 Financial insecurity: Secondary | ICD-10-CM

## 2023-11-06 DIAGNOSIS — F332 Major depressive disorder, recurrent severe without psychotic features: Principal | ICD-10-CM | POA: Diagnosis present

## 2023-11-06 DIAGNOSIS — F329 Major depressive disorder, single episode, unspecified: Secondary | ICD-10-CM | POA: Diagnosis not present

## 2023-11-06 DIAGNOSIS — R4584 Anhedonia: Secondary | ICD-10-CM | POA: Insufficient documentation

## 2023-11-06 LAB — URINALYSIS, ROUTINE W REFLEX MICROSCOPIC
Bacteria, UA: NONE SEEN
Bilirubin Urine: NEGATIVE
Glucose, UA: >=500 mg/dL — AB
Hgb urine dipstick: NEGATIVE
Ketones, ur: NEGATIVE mg/dL
Leukocytes,Ua: NEGATIVE
Nitrite: NEGATIVE
Protein, ur: NEGATIVE mg/dL
Specific Gravity, Urine: 1.017 (ref 1.005–1.030)
pH: 6 (ref 5.0–8.0)

## 2023-11-06 LAB — TSH: TSH: 1.028 u[IU]/mL (ref 0.350–4.500)

## 2023-11-06 LAB — CBC WITH DIFFERENTIAL/PLATELET
Abs Immature Granulocytes: 0.03 K/uL (ref 0.00–0.07)
Basophils Absolute: 0 K/uL (ref 0.0–0.1)
Basophils Relative: 1 %
Eosinophils Absolute: 0.2 K/uL (ref 0.0–0.5)
Eosinophils Relative: 3 %
HCT: 42.9 % (ref 39.0–52.0)
Hemoglobin: 14.6 g/dL (ref 13.0–17.0)
Immature Granulocytes: 1 %
Lymphocytes Relative: 24 %
Lymphs Abs: 1.6 K/uL (ref 0.7–4.0)
MCH: 27.4 pg (ref 26.0–34.0)
MCHC: 34 g/dL (ref 30.0–36.0)
MCV: 80.6 fL (ref 80.0–100.0)
Monocytes Absolute: 0.4 K/uL (ref 0.1–1.0)
Monocytes Relative: 5 %
Neutro Abs: 4.4 K/uL (ref 1.7–7.7)
Neutrophils Relative %: 66 %
Platelets: 289 K/uL (ref 150–400)
RBC: 5.32 MIL/uL (ref 4.22–5.81)
RDW: 12.9 % (ref 11.5–15.5)
WBC: 6.6 K/uL (ref 4.0–10.5)
nRBC: 0 % (ref 0.0–0.2)

## 2023-11-06 LAB — SARS CORONAVIRUS 2 BY RT PCR: SARS Coronavirus 2 by RT PCR: NEGATIVE

## 2023-11-06 LAB — LIPID PANEL
Cholesterol: 279 mg/dL — ABNORMAL HIGH (ref 0–200)
HDL: 60 mg/dL (ref >40)
LDL Cholesterol: 163 mg/dL — ABNORMAL HIGH (ref 0–99)
Total CHOL/HDL Ratio: 4.7 ratio
Triglycerides: 282 mg/dL — ABNORMAL HIGH (ref <150)
VLDL: 56 mg/dL — ABNORMAL HIGH (ref 0–40)

## 2023-11-06 LAB — GLUCOSE, CAPILLARY
Glucose-Capillary: 351 mg/dL — ABNORMAL HIGH (ref 70–99)
Glucose-Capillary: 373 mg/dL — ABNORMAL HIGH (ref 70–99)
Glucose-Capillary: 269 mg/dL — ABNORMAL HIGH (ref 70–99)
Glucose-Capillary: 160 mg/dL — ABNORMAL HIGH (ref 70–99)

## 2023-11-06 LAB — COMPREHENSIVE METABOLIC PANEL WITH GFR
ALT: 20 U/L (ref 0–44)
AST: 16 U/L (ref 15–41)
Albumin: 4 g/dL (ref 3.5–5.0)
Alkaline Phosphatase: 54 U/L (ref 38–126)
Anion gap: 15 (ref 5–15)
BUN: 12 mg/dL (ref 8–23)
CO2: 21 mmol/L — ABNORMAL LOW (ref 22–32)
Calcium: 9.7 mg/dL (ref 8.9–10.3)
Chloride: 93 mmol/L — ABNORMAL LOW (ref 98–111)
Creatinine, Ser: 0.92 mg/dL (ref 0.61–1.24)
GFR, Estimated: >60 mL/min (ref >60)
Glucose, Bld: 399 mg/dL — ABNORMAL HIGH (ref 70–99)
Potassium: 4.1 mmol/L (ref 3.5–5.1)
Sodium: 129 mmol/L — ABNORMAL LOW (ref 135–145)
Total Bilirubin: 0.8 mg/dL (ref 0.0–1.2)
Total Protein: 7.2 g/dL (ref 6.5–8.1)

## 2023-11-06 LAB — POCT URINE DRUG SCREEN - MANUAL ENTRY (I-SCREEN)
POC Amphetamine UR: NOT DETECTED
POC Buprenorphine (BUP): NOT DETECTED
POC Cocaine UR: NOT DETECTED
POC Marijuana UR: NOT DETECTED
POC Methadone UR: NOT DETECTED
POC Methamphetamine UR: NOT DETECTED
POC Morphine: NOT DETECTED
POC Oxazepam (BZO): NOT DETECTED
POC Oxycodone UR: NOT DETECTED
POC Secobarbital (BAR): NOT DETECTED

## 2023-11-06 LAB — ETHANOL: Alcohol, Ethyl (B): <15 mg/dL (ref <15)

## 2023-11-06 LAB — HIV ANTIBODY (ROUTINE TESTING W REFLEX): HIV Screen 4th Generation wRfx: NONREACTIVE

## 2023-11-06 LAB — MAGNESIUM: Magnesium: 2 mg/dL (ref 1.7–2.4)

## 2023-11-06 MED ORDER — INSULIN ASPART 100 UNIT/ML IJ SOLN: 0.0000 [IU] | Freq: Every day | INTRAMUSCULAR | Status: DC

## 2023-11-06 MED ORDER — ALUM & MAG HYDROXIDE-SIMETH 200-200-20 MG/5ML PO SUSP: 30.0000 mL | ORAL | Status: DC | PRN

## 2023-11-06 MED ORDER — ACETAMINOPHEN 325 MG PO TABS
650.0000 mg | ORAL_TABLET | Freq: Four times a day (QID) | ORAL | Status: DC | PRN
  Administered 2023-11-06 (×2): 650 mg via ORAL
  Filled 2023-11-06 (×3): qty 2

## 2023-11-06 MED ORDER — MAGNESIUM HYDROXIDE 400 MG/5ML PO SUSP: 30.0000 mL | Freq: Every day | ORAL | Status: DC | PRN

## 2023-11-06 MED ORDER — OLANZAPINE 10 MG IM SOLR: 5.0000 mg | Freq: Three times a day (TID) | INTRAMUSCULAR | Status: DC | PRN

## 2023-11-06 MED ORDER — TRAZODONE HCL 50 MG PO TABS: 50.0000 mg | ORAL_TABLET | Freq: Every evening | ORAL | Status: DC | PRN

## 2023-11-06 MED ORDER — METFORMIN HCL 500 MG PO TABS: 1000.0000 mg | ORAL_TABLET | Freq: Two times a day (BID) | ORAL | Status: DC

## 2023-11-06 MED ORDER — HYDROXYZINE HCL 25 MG PO TABS
25.0000 mg | ORAL_TABLET | Freq: Three times a day (TID) | ORAL | Status: DC | PRN
  Administered 2023-11-07 – 2023-11-16 (×7): 25 mg via ORAL
  Filled 2023-11-06 (×5): qty 1

## 2023-11-06 MED ORDER — TRAZODONE HCL 50 MG PO TABS
50.0000 mg | ORAL_TABLET | Freq: Every evening | ORAL | Status: DC | PRN
  Administered 2023-11-07 – 2023-11-16 (×12): 50 mg via ORAL
  Filled 2023-11-06 (×10): qty 1

## 2023-11-06 MED ORDER — METFORMIN HCL 500 MG PO TABS
1000.0000 mg | ORAL_TABLET | Freq: Two times a day (BID) | ORAL | Status: DC
  Administered 2023-11-06: 1000 mg via ORAL
  Filled 2023-11-06: qty 2

## 2023-11-06 MED ORDER — SERTRALINE HCL 50 MG PO TABS
150.0000 mg | ORAL_TABLET | Freq: Every day | ORAL | Status: DC
  Administered 2023-11-06: 150 mg via ORAL
  Filled 2023-11-06: qty 3

## 2023-11-06 MED ORDER — ARIPIPRAZOLE 10 MG PO TABS
10.0000 mg | ORAL_TABLET | Freq: Every day | ORAL | Status: DC
  Administered 2023-11-06: 10 mg via ORAL
  Filled 2023-11-06: qty 1

## 2023-11-06 MED ORDER — INSULIN ASPART 100 UNIT/ML IJ SOLN: 0.0000 [IU] | Freq: Three times a day (TID) | INTRAMUSCULAR | Status: DC

## 2023-11-06 MED ORDER — LISINOPRIL 20 MG PO TABS
40.0000 mg | ORAL_TABLET | Freq: Every day | ORAL | Status: DC
  Administered 2023-11-06: 40 mg via ORAL
  Filled 2023-11-06: qty 2

## 2023-11-06 MED ORDER — PANTOPRAZOLE SODIUM 40 MG PO TBEC
40.0000 mg | DELAYED_RELEASE_TABLET | Freq: Every day | ORAL | Status: DC
  Administered 2023-11-06: 40 mg via ORAL
  Filled 2023-11-06: qty 1

## 2023-11-06 MED ORDER — OLANZAPINE 5 MG PO TBDP: 5.0000 mg | ORAL_TABLET | Freq: Three times a day (TID) | ORAL | Status: DC | PRN

## 2023-11-06 MED ORDER — INSULIN ASPART 100 UNIT/ML IJ SOLN
3.0000 [IU] | Freq: Three times a day (TID) | INTRAMUSCULAR | Status: DC
Start: 2023-11-06 — End: 2023-11-06

## 2023-11-06 MED ORDER — ACETAMINOPHEN 325 MG PO TABS
650.0000 mg | ORAL_TABLET | Freq: Four times a day (QID) | ORAL | Status: DC | PRN
  Administered 2023-11-07 – 2023-11-17 (×20): 650 mg via ORAL
  Filled 2023-11-06 (×17): qty 2

## 2023-11-06 MED ORDER — INSULIN ASPART 100 UNIT/ML IJ SOLN
0.0000 [IU] | Freq: Three times a day (TID) | INTRAMUSCULAR | Status: DC
  Administered 2023-11-06: 20 [IU] via SUBCUTANEOUS
  Administered 2023-11-06: 11 [IU] via SUBCUTANEOUS

## 2023-11-06 MED ORDER — GLIPIZIDE ER 2.5 MG PO TB24
5.0000 mg | ORAL_TABLET | Freq: Every day | ORAL | Status: DC
  Administered 2023-11-06: 5 mg via ORAL
  Filled 2023-11-06: qty 2

## 2023-11-06 MED ORDER — ATORVASTATIN CALCIUM 40 MG PO TABS
40.0000 mg | ORAL_TABLET | Freq: Every day | ORAL | Status: DC
  Administered 2023-11-06: 40 mg via ORAL
  Filled 2023-11-06: qty 1

## 2023-11-06 MED ORDER — ACETAMINOPHEN 325 MG PO TABS: 650.0000 mg | ORAL_TABLET | Freq: Four times a day (QID) | ORAL | Status: DC | PRN

## 2023-11-06 NOTE — ED Notes (Signed)
 Patient calm and resting in chair in adult unit. Patient aware of transfer to [facility] BMU. RR even and unlabored, appearing in no noted distress. Environmental check complete

## 2023-11-06 NOTE — ED Notes (Signed)
 Patient resting with eyes closed. Respirations even and unlabored. No distress noted. Environment secured. Plan of care ongoing, no further concerns as of present.
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
## 2023-11-06 NOTE — ED Notes (Signed)
 Patient currently resting in recliner. RR even and unlabored, appearing in no noted distress. Environmental check complete

## 2023-11-06 NOTE — ED Notes (Signed)
 Patient calm and cooperative during initial nurse shift assessment. Pt observed/assessed in recliner. Patient alert and oriented X4. Patient denies any current SI/HI/AVH. Patient inquired about his transfer. Nurse educated on transfer process. Patient verbalized understanding.  RR even and unlabored, appearing in no noted distress. Environmental check complete.

## 2023-11-06 NOTE — Progress Notes (Signed)
   11/06/23 1015  [facility] Triage Screening (Walk-ins at [facility] only)  How Did You Hear About Us ? Self  What Is the Reason for Your Visit/Call Today? 63 year old [name] presents to [facility] via [facility] for suicidal ideations with a plan to overdose on fentanyl  he can buy off the street. Reported negative intrusive thoughts started yesterday (11/05/2023) and worsen overnight with progressive suicidal thought. Reported the thoughts are telling him he's a burden, he needs to die and should kill himself. Denied HI and AVH. Patient reports taking medication Zoloft , Abilify  and another sleep aide medication he could not recall the name. Reported he ran out of his medication on Monday due living on a fixed income. Patient reported prior to yesterday (11/05/2023) his mental health was stable.  How Long Has This Been Causing You Problems? <Week (Reported negative intrusive suicidal thoughts started 11/05/2023 triggered by running out of medication on Monday 10/31/2023.)  Have You Recently Had Any Thoughts About Hurting Yourself? Yes  How long ago did you have thoughts about hurting yourself? Patient was seen at [facility] May 2025 for suicidal ideations with a plan to overdose on fentanyl .  Are You Planning to Commit Suicide/Harm Yourself At This time? Yes (Patient report suicidal ideations with a plan to overdose on fentanyl )  Have you Recently Had Thoughts About Hurting Someone [place]? No (denied HI thoughts)  Are You Planning To Harm Someone At This Time? No  Physical Abuse Denies  Verbal Abuse Denies  Sexual Abuse Denies  Exploitation of patient/patient's resources Denies  Self-Neglect Denies  Possible abuse reported to: Other (Comment) (denied abuse, no abuse to report)  Are you currently experiencing any auditory, visual or other hallucinations? Yes  Please explain the hallucinations you are currently experiencing: Patient report thoughts (denies hearing voices on thoughts) telling him to kill himself.  Have You  Used Any Alcohol or Drugs in the Past 24 Hours? Yes  What Did You Use and How Much? Patient report he drank 1 beer yesterday, denied additional substance usage  Do you have any current medical co-morbidities that require immediate attention? Yes  Please describe current medical co-morbidities that require immediate attention: hypertension, type 2 diabetes, high cholestrol,  Clinician description of patient physical appearance/behavior: sad, depressed, cooperative and pleasant  What Do You Feel Would Help You the Most Today? Treatment for Depression or other mood problem  If access to [facility] was not available, would you have sought care in the Emergency Department? Yes  Determination of Need Urgent (48 hours)  Options For Referral Inpatient Hospitalization;Medication Management  Determination of Need filed? Yes

## 2023-11-06 NOTE — ED Provider Notes (Signed)
 [facility] Continuous Assessment Admission H&P  Date: 11/06/23 Patient Name: [name] MRN: [id number] Chief Complaint: SI with a plan  Diagnoses:  Final diagnoses:  MDD (major depressive disorder), recurrent, severe, with psychosis [facility])   HPI: Per triager: 63 year old [name] presents to [facility] via [facility] for suicidal ideations with a plan to overdose on fentanyl  he can buy off the street. Reported negative intrusive thoughts started yesterday (11/05/2023) and worsen overnight with progressive suicidal thought.  Assessment: During encounter with pt, he reports worsening depressive symptoms for at least the past 2 weeks; reports that he has not seen a provider for med management thinks he is discharged from the hospital in May of this year.  Reports that he was seeing Dr. Vincente, but has services had gotten too expensive for him, he was switched over to [facility], has an appointment coming, but has not been purchasing a provider day yet, even though he has seen somebody already for therapy.  Patient reports that past at least 3 weeks, he has been stretching his medications out to make them last him longer, due to not having enough medications. He share that he has been taking his mental health medications every 2-3 days, and eventually ran out of medications Monday last week, and shares that his mental health progressively got worse.  Patient reports suicidal ideations that he had prior to that he had a plan to purchase fentanyl  from the streets to use that to kill himself even though he is not a substance abuser. He is tearful throughout the assessment, repeatedly states I wish I was dead patient also repeatedly states that he wishes he had used when he had it to blow his brains out. States that he had given the gun to his sister a year ago because he kept it and repeatedly pondered using it shoot himself, and was trying to build up the momentum to do so. He shares that when he  told his sister about it, she took it away.  Pt reports current stressors as the lack of a support from his biological family, states that he was residing with his family specifically his sister about a year ago in [place], and she kicked him out.  Reports that he has 2 sisters, and the mother who resides in [place].  States that he has never been married and has no children.  Reports that his charge family is supportive of him, and he is able to visit them often.  He reports that another stressor is that he misses his dog who passed away a few months ago.  He reports that the only reason that has rendered him living, and he has not killed himself yet, is because her mouse found its way into his room, & he has been feeding the mouse, and it has been providing him with the comfort that he he needs, and he has been thinking that there is hope. He states that another protective factor is the fact that he donate his time to the homeless people, and visits them often to volunteer.  He reports that he woke up this morning feeling hopeless than usual, started having command type auditory hallucinations of a voice coming from outside of his head telling him that he is worthless and that he should kill himself because his life has no value. Pt states I wish I was dead when he states this. He shares that he has also been having worsening feelings of insomnia, anhedonia, guilt and shame  because of the things that have taken place in his life over the past couple of years, but he does not elaborate.  He reports poor concentration levels, poor appetite, worsening feelings of hopelessness, helplessness, worthlessness, psychomotor retardation, mental clouding.  Pt with flat affect and depressed mood, attention to personal hygiene and grooming is poor, eye contact is good, speech is clear & coherent. Thought contents are organized and logical, and pt currently denies HI/VH or paranoia. There is no evidence of delusional  thoughts. He continues to endorse SI with plan to OD through entire assessment. Denies intent, states would like to get help to get better. Denies substance abuse. States that he resides at a boarding house for which he pays $600 per month.   [place] Suicide Risk assessment:  1. Do you wish to be dead? YES 2. Have you wished your dead or wished you could go to sleep and not wake up?  YES 3.  Have you actually had thoughts of killing yourself?   YES 4.  Have you been thinking about how you might do this?  YES  5.  Have you had these thoughts and some intention of acting on them? YES 6.  Have you started to work out or worked out the details to kill yourself?  YES 7.  Do you intend to carry out this plan? I don't know. 8. On a scale of 1-5 with 1 being the least severe and 5 being the most severe answer the following questions place for intensity of ideation. 5 9. How many times have you had these thoughts? Uncountable times. 10. When you have the thoughts how long to the last? It depends, but it typically lasts all day, and comes and goes. 11. Control ability.  Could you or can you stop thinking about killing herself or wanting to die if you want to?  No 12. Are there any things anyone or anything family religion pain of death that stop you from wanting to die or acting on thoughts of committing suicide?  Religious thoughts about taking life. 13.  What sort of reason to do have to think about wanting to die or killing yourself? Financial stressors, feelings of sadness, worsening intrusive thoughts of death.  14. Have you done anything, started to do anything,  or prepared to do anything to end your life? Examples: Took pills, tried to shoot yourself, cut yourself, tried to hang yourself,  took out pills but didn't swallow any, held a gun but changed your mind or it was  grabbed from your hand, went to the roof but didn't jump, collected pills, obtained  a gun, gave away valuables, wrote a  will or suicide note, etc. -Yes. Does not elaborate.  Suicide Risk Assessment  SUICIDE RISK:  Severe:  Frequent, intense, and enduring suicidal ideation, specific plan prior to hospitalization, no subjective intent, but some objective markers of intent (i.e., choice of lethal method), the method is accessible, Patient has had some preparatory behavior & there is evidence of impaired self-control, severe dysphoria/symptomatology, multiple risk factors present, and few if any protective factors, particularly a lack of social support. Pt is also male, white, older which predispose him to completing suicide.  Total Time spent with patient: 1.5 hours  Musculoskeletal  Strength & Muscle Tone: within normal limits Gait & Station: normal Patient leans: N/A  Psychiatric Specialty Exam  Presentation General Appearance:  Appropriate for Environment; Disheveled  Eye Contact: Fair  Speech: Clear and Coherent  Speech Volume: Normal  Handedness: Right   Mood and Affect  Mood: Depressed; Anxious  Affect: Congruent   Thought Process  Thought Processes: Coherent  Descriptions of Associations:Intact  Orientation:Full (Time, Place and Person)  Thought Content:Logical  Diagnosis of Schizophrenia or Schizoaffective disorder in past: No   Hallucinations:Hallucinations: None  Ideas of Reference:None  Suicidal Thoughts:Suicidal Thoughts: Yes, Active SI Active Intent and/or Plan: Without Intent; With Plan  Homicidal Thoughts:Homicidal Thoughts: No   Sensorium  Memory: Immediate Fair  Judgment: Fair  Insight: Fair   Chartered certified accountant: Fair  Attention Span: Fair  Recall: [employer] of Knowledge: Fair  Language: Fair   Psychomotor Activity  Psychomotor Activity:Psychomotor Activity: Normal   Assets  Assets: Manufacturing systems engineer; Desire for Improvement; Financial Resources/Insurance; Intimacy; Physical Health; Resilience; Social  Support   Sleep  Sleep:Sleep: Fair   No data recorded  Physical Exam Vitals and nursing note reviewed.  HENT:     Head: Normocephalic.  Eyes:     Pupils: Pupils are equal, round, and reactive to light.  Musculoskeletal:        General: Normal range of motion.  Neurological:     General: No focal deficit present.     Mental Status: He is oriented to person, place, and time.  Psychiatric:        Behavior: Behavior normal.        Judgment: Judgment normal.    Review of Systems  Psychiatric/Behavioral:  Positive for depression, hallucinations and suicidal ideas. Negative for memory loss and substance abuse. The patient is nervous/anxious and has insomnia.   All other systems reviewed and are negative.   Blood pressure (!) 156/86, pulse 70, temperature 97.8 F (36.6 C), temperature source Oral, resp. rate 18, SpO2 97%. There is no height or weight on file to calculate BMI.  Past Psychiatric History: MDD recurrent    Is the patient at risk to self? Yes  Has the patient been a risk to self in the past 6 months? Yes .    Has the patient been a risk to self within the distant past? Yes   Is the patient a risk to others? Yes   Has the patient been a risk to others in the past 6 months? Yes   Has the patient been a risk to others within the distant past? No   Past Medical History: htn, DM  Family History: Did not provide  Social History: See CSW note  Last Labs:  Admission on 11/06/2023  Component Date Value Ref Range Status   POC Amphetamine UR 11/06/2023 None Detected  NONE DETECTED (Cut Off Level 1000 ng/mL) Final   POC Secobarbital (BAR) 11/06/2023 None Detected  NONE DETECTED (Cut Off Level 300 ng/mL) Final   POC Buprenorphine (BUP) 11/06/2023 None Detected  NONE DETECTED (Cut Off Level 10 ng/mL) Final   POC Oxazepam (BZO) 11/06/2023 None Detected  NONE DETECTED (Cut Off Level 300 ng/mL) Final   POC Cocaine UR 11/06/2023 None Detected  NONE DETECTED (Cut Off Level 300  ng/mL) Final   POC Methamphetamine UR 11/06/2023 None Detected  NONE DETECTED (Cut Off Level 1000 ng/mL) Final   POC Morphine 11/06/2023 None Detected  NONE DETECTED (Cut Off Level 300 ng/mL) Final   POC Methadone UR 11/06/2023 None Detected  NONE DETECTED (Cut Off Level 300 ng/mL) Final   POC Oxycodone  UR 11/06/2023 None Detected  NONE DETECTED (Cut Off Level 100 ng/mL) Final   POC Marijuana UR 11/06/2023 None Detected  NONE DETECTED (Cut Off Level  50 ng/mL) Final  Admission on 08/26/2023, Discharged on 08/31/2023  Component Date Value Ref Range Status   Glucose-Capillary 08/28/2023 163 (H)  70 - 99 mg/dL Final   Glucose reference range applies only to samples taken after fasting for at least 8 hours.   Glucose-Capillary 08/28/2023 151 (H)  70 - 99 mg/dL Final   Glucose reference range applies only to samples taken after fasting for at least 8 hours.   Glucose-Capillary 08/28/2023 126 (H)  70 - 99 mg/dL Final   Glucose reference range applies only to samples taken after fasting for at least 8 hours.   Comment 1 08/28/2023 Notify RN   Final   Glucose-Capillary 08/29/2023 191 (H)  70 - 99 mg/dL Final   Glucose reference range applies only to samples taken after fasting for at least 8 hours.   Comment 1 08/29/2023 Notify RN   Final   Glucose-Capillary 08/29/2023 177 (H)  70 - 99 mg/dL Final   Glucose reference range applies only to samples taken after fasting for at least 8 hours.   Glucose-Capillary 08/29/2023 157 (H)  70 - 99 mg/dL Final   Glucose reference range applies only to samples taken after fasting for at least 8 hours.   Glucose-Capillary 08/29/2023 168 (H)  70 - 99 mg/dL Final   Glucose reference range applies only to samples taken after fasting for at least 8 hours.   Comment 1 08/29/2023 Notify RN   Final   Glucose-Capillary 08/30/2023 215 (H)  70 - 99 mg/dL Final   Glucose reference range applies only to samples taken after fasting for at least 8 hours.   Glucose-Capillary  08/30/2023 220 (H)  70 - 99 mg/dL Final   Glucose reference range applies only to samples taken after fasting for at least 8 hours.   Glucose-Capillary 08/30/2023 161 (H)  70 - 99 mg/dL Final   Glucose reference range applies only to samples taken after fasting for at least 8 hours.   Glucose-Capillary 08/31/2023 155 (H)  70 - 99 mg/dL Final   Glucose reference range applies only to samples taken after fasting for at least 8 hours.   Comment 1 08/31/2023 Notify RN   Final   Comment 2 08/31/2023 Document in Chart   Final  Admission on 08/25/2023, Discharged on 08/26/2023  Component Date Value Ref Range Status   WBC 08/25/2023 9.0  4.0 - 10.5 K/uL Final   RBC 08/25/2023 5.38  4.22 - 5.81 MIL/uL Final   Hemoglobin 08/25/2023 14.8  13.0 - 17.0 g/dL Final   HCT 94/70/7974 43.9  39.0 - 52.0 % Final   MCV 08/25/2023 81.6  80.0 - 100.0 fL Final   MCH 08/25/2023 27.5  26.0 - 34.0 pg Final   MCHC 08/25/2023 33.7  30.0 - 36.0 g/dL Final   RDW 94/70/7974 13.1  11.5 - 15.5 % Final   Platelets 08/25/2023 282  150 - 400 K/uL Final   nRBC 08/25/2023 0.0  0.0 - 0.2 % Final   Neutrophils Relative % 08/25/2023 68  % Final   Neutro Abs 08/25/2023 6.1  1.7 - 7.7 K/uL Final   Lymphocytes Relative 08/25/2023 23  % Final   Lymphs Abs 08/25/2023 2.1  0.7 - 4.0 K/uL Final   Monocytes Relative 08/25/2023 5  % Final   Monocytes Absolute 08/25/2023 0.5  0.1 - 1.0 K/uL Final   Eosinophils Relative 08/25/2023 3  % Final   Eosinophils Absolute 08/25/2023 0.3  0.0 - 0.5 K/uL Final   Basophils  Relative 08/25/2023 1  % Final   Basophils Absolute 08/25/2023 0.1  0.0 - 0.1 K/uL Final   Immature Granulocytes 08/25/2023 0  % Final   Abs Immature Granulocytes 08/25/2023 0.02  0.00 - 0.07 K/uL Final   Performed at [facility], [address], [place], [place] [address]   Sodium 08/25/2023 136  135 - 145 mmol/L Final   Potassium 08/25/2023 3.9  3.5 - 5.1 mmol/L Final   Chloride 08/25/2023 102  98 - 111 mmol/L Final    CO2 08/25/2023 21 (L)  22 - 32 mmol/L Final   Glucose, Bld 08/25/2023 178 (H)  70 - 99 mg/dL Final   Glucose reference range applies only to samples taken after fasting for at least 8 hours.   BUN 08/25/2023 13  8 - 23 mg/dL Final   Creatinine, Ser 08/25/2023 1.14  0.61 - 1.24 mg/dL Final   Calcium  08/25/2023 9.7  8.9 - 10.3 mg/dL Final   Total Protein 94/70/7974 7.4  6.5 - 8.1 g/dL Final   Albumin 94/70/7974 4.2  3.5 - 5.0 g/dL Final   AST 94/70/7974 29  15 - 41 U/L Final   ALT 08/25/2023 30  0 - 44 U/L Final   Alkaline Phosphatase 08/25/2023 53  38 - 126 U/L Final   Total Bilirubin 08/25/2023 0.6  0.0 - 1.2 mg/dL Final   GFR, Estimated 08/25/2023 >60  >60 mL/min Final   Comment: (NOTE) Calculated using the CKD-EPI Creatinine Equation (2021)    Anion gap 08/25/2023 13  5 - 15 Final   Performed at [facility], [address], [place], [place] [address]   Hgb A1c MFr Bld 08/25/2023 9.4 (H)  4.8 - 5.6 % Final   Comment: (NOTE) Diagnosis of Diabetes The following HbA1c ranges recommended by the American Diabetes Association (ADA) may be used as an aid in the diagnosis of diabetes mellitus.  Hemoglobin             Suggested A1C NGSP%              Diagnosis  <5.7                   Non Diabetic  5.7-6.4                Pre-Diabetic  >6.4                   Diabetic  <7.0                   Glycemic control for                       adults with diabetes.     Mean Plasma Glucose 08/25/2023 223.08  mg/dL Final   Performed at [facility], [address] [place], [place] [address]   Magnesium  08/25/2023 1.9  1.7 - 2.4 mg/dL Final   Performed at [facility], [address] [place], [place] [address]   Alcohol, Ethyl (B) 08/25/2023 <15  <15 mg/dL Final   Comment: (NOTE) For medical purposes only. Performed at [facility], [address], [place], [place] [address]    Cholesterol 08/25/2023 138  0 - 200 mg/dL Final   Triglycerides 94/70/7974 201 (H)   <150 mg/dL Final   HDL 94/70/7974 41  >40 mg/dL Final   Total CHOL/HDL Ratio 08/25/2023 3.4  RATIO Final   VLDL 08/25/2023 40  0 - 40 mg/dL Final   LDL Cholesterol 08/25/2023 57  0 - 99 mg/dL Final   Comment:        Total Cholesterol/HDL:CHD Risk Coronary Heart Disease Risk Table                     Men   Women  1/2 Average Risk   3.4   3.3  Average Risk       5.0   4.4  2 X Average Risk   9.6   7.1  3 X Average Risk  23.4   11.0        Use the calculated Patient Ratio above and the CHD Risk Table to determine the patient's CHD Risk.        ATP III CLASSIFICATION (LDL):  <100     mg/dL   Optimal  899-870  mg/dL   Near or Above                    Optimal  130-159  mg/dL   Borderline  839-810  mg/dL   High  >809     mg/dL   Very High Performed at [facility], [address] [place], [place] [address]    TSH 08/25/2023 1.049  0.350 - 4.500 uIU/mL Final   Comment: Performed by a 3rd Generation assay with a functional sensitivity of <=0.01 uIU/mL. Performed at [facility], [address] [place], [place] [address]    Color, Urine 08/25/2023 YELLOW  YELLOW Final   APPearance 08/25/2023 TURBID (A)  CLEAR Final   Specific Gravity, Urine 08/25/2023 1.028  1.005 - 1.030 Final   pH 08/25/2023 5.0  5.0 - 8.0 Final   Glucose, UA 08/25/2023 50 (A)  NEGATIVE mg/dL Final   Hgb urine dipstick 08/25/2023 NEGATIVE  NEGATIVE Final   Bilirubin Urine 08/25/2023 NEGATIVE  NEGATIVE Final   Ketones, ur 08/25/2023 5 (A)  NEGATIVE mg/dL Final   Protein, ur 94/70/7974 30 (A)  NEGATIVE mg/dL Final   Nitrite 94/70/7974 NEGATIVE  NEGATIVE Final   Leukocytes,Ua 08/25/2023 NEGATIVE  NEGATIVE Final   RBC / HPF 08/25/2023 6-10  0 - 5 RBC/hpf Final   WBC, UA 08/25/2023 0-5  0 - 5 WBC/hpf Final   Bacteria, UA 08/25/2023 FEW (A)  NONE SEEN Final   Squamous Epithelial / HPF 08/25/2023 0-5  0 - 5 /HPF Final   Mucus 08/25/2023 PRESENT   Final   Performed at [facility], [address] [place], [place] [address]   POC Amphetamine UR 08/25/2023 None Detected  NONE DETECTED (Cut Off Level 1000 ng/mL) Final   POC Secobarbital (BAR) 08/25/2023 None Detected  NONE DETECTED (Cut Off Level 300 ng/mL) Final   POC Buprenorphine (BUP) 08/25/2023 None Detected  NONE DETECTED (Cut Off Level 10 ng/mL) Final   POC Oxazepam (BZO) 08/25/2023 None Detected  NONE DETECTED (Cut Off Level 300 ng/mL) Final   POC Cocaine UR 08/25/2023 None Detected  NONE DETECTED (Cut Off Level 300 ng/mL) Final   POC Methamphetamine UR 08/25/2023 None Detected  NONE DETECTED (Cut Off Level 1000 ng/mL) Final   POC Morphine 08/25/2023 None Detected  NONE DETECTED (Cut Off Level 300 ng/mL) Final   POC Methadone UR 08/25/2023 None Detected  NONE DETECTED (Cut Off Level 300 ng/mL) Final   POC Oxycodone  UR 08/25/2023 None Detected  NONE DETECTED (Cut Off Level 100 ng/mL) Final   POC Marijuana UR 08/25/2023 None Detected  NONE DETECTED (Cut Off Level 50 ng/mL) Final   Allergies: Other  Medications:  Facility Ordered Medications  Medication   atorvastatin  (LIPITOR) tablet 40 mg   lisinopril  (ZESTRIL ) tablet 40 mg   ARIPiprazole  (ABILIFY ) tablet 10 mg   sertraline  (ZOLOFT ) tablet 150 mg   traZODone  (DESYREL ) tablet 50 mg   glipiZIDE  (GLUCOTROL  XL) 24 hr tablet 5 mg   metFORMIN  (GLUCOPHAGE ) tablet 1,000 mg   pantoprazole  (PROTONIX ) EC tablet 40 mg   acetaminophen  (TYLENOL ) tablet 650 mg   alum & mag hydroxide-simeth (MAALOX/MYLANTA) 200-200-20 MG/5ML suspension 30 mL   OLANZapine  zydis (ZYPREXA ) disintegrating tablet 5 mg   OLANZapine  (ZYPREXA ) injection 5 mg   PTA Medications  Medication Sig   atorvastatin  (LIPITOR) 40 MG tablet Take 1 tablet (40 mg total) by mouth daily.   ARIPiprazole  (ABILIFY ) 10 MG tablet Take 1 tablet (10 mg total) by mouth daily.   sertraline  (ZOLOFT ) 50 MG tablet Take 3 tablets (150 mg total) by mouth daily.   traZODone  (DESYREL ) 50 MG tablet Take 1 tablet (50 mg total) by mouth at  bedtime as needed for sleep.   lisinopril  (ZESTRIL ) 40 MG tablet Take 1 tablet (40 mg total) by mouth daily.   glipiZIDE  (GLUCOTROL  XL) 5 MG 24 hr tablet Take 1 tablet (5 mg total) by mouth daily.   metFORMIN  (GLUCOPHAGE ) 1000 MG tablet Take 1 tablet (1,000 mg total) by mouth 2 (two) times daily with a meal.   pantoprazole  (PROTONIX ) 40 MG tablet Take 1 tablet (40 mg total) by mouth daily.    Medical Decision Making  -Referred for inpatient behavioral health hospitalization. - Accepted to the [facility] inpatient [facility], pending labs -Ordered baseline labs: TSH, hemoglobin A1c, BMP, CMP, vitamin D, B12, EKG. -Ordered home medications.   Recommendations  Based on my evaluation the patient appears to have an emergency mental health condition for which I recommend the patient be transferred to an inpatient behavioral health unit for treatment and stabilization.   Donia Snell, NP 11/06/23  12:00 PM

## 2023-11-06 NOTE — ED Notes (Signed)
 Patient presents to [facility] voluntarily via [employer] with SI and plan to overdose on fentanyl . Reports he ran out of his medications and his dog dying in June triggered this event and the onset of auditory hallucinations. Reports that the hallucinations tell him you're worthless, you should kill yourself, and you're a curse. Reports researching how to kill himself on a pro-suicide website and bought fentanyl  but had second thoughts and called law enforcement who advised him to dispose of it.  Patient verbalizes SI with a plan to overdose on fentanyl ; but is able to contract for safety. Patient is A&Ox4. Denies A/VH at present, reports the auditory hallucinations are present when he's alone. Patient denies any physical complaints when asked. No distress noted. Support and encouragement provided. Routine safety checks conducted according to facility protocol. Encouraged patient to notify staff if thoughts of harm toward self or others arise. Endorses safety. Patient verbalized understanding and agreement. Plan of care ongoing, no further concerns as of present. Patient expresses no other needs at this time.

## 2023-11-07 ENCOUNTER — Other Ambulatory Visit (HOSPITAL_COMMUNITY): Payer: Self-pay

## 2023-11-07 ENCOUNTER — Encounter: Payer: Self-pay | Admitting: Nurse Practitioner

## 2023-11-07 ENCOUNTER — Other Ambulatory Visit: Payer: Self-pay

## 2023-11-07 DIAGNOSIS — F329 Major depressive disorder, single episode, unspecified: Secondary | ICD-10-CM | POA: Diagnosis not present

## 2023-11-07 LAB — GLUCOSE, CAPILLARY
Glucose-Capillary: 365 mg/dL — ABNORMAL HIGH (ref 70–99)
Glucose-Capillary: 268 mg/dL — ABNORMAL HIGH (ref 70–99)
Glucose-Capillary: 144 mg/dL — ABNORMAL HIGH (ref 70–99)
Glucose-Capillary: 149 mg/dL — ABNORMAL HIGH (ref 70–99)

## 2023-11-07 LAB — RPR: RPR Ser Ql: NONREACTIVE

## 2023-11-07 LAB — HEMOGLOBIN A1C
Hgb A1c MFr Bld: 10.6 % — ABNORMAL HIGH (ref 4.8–5.6)
Mean Plasma Glucose: 258 mg/dL

## 2023-11-07 MED ORDER — ARIPIPRAZOLE 10 MG PO TABS
10.0000 mg | ORAL_TABLET | Freq: Every day | ORAL | Status: DC
  Administered 2023-11-07 – 2023-11-10 (×7): 10 mg via ORAL
  Filled 2023-11-07 (×4): qty 1

## 2023-11-07 MED ORDER — ATORVASTATIN CALCIUM 20 MG PO TABS
40.0000 mg | ORAL_TABLET | Freq: Every day | ORAL | Status: DC
  Administered 2023-11-07 – 2023-11-17 (×14): 40 mg via ORAL
  Filled 2023-11-07 (×11): qty 2

## 2023-11-07 MED ORDER — PANTOPRAZOLE SODIUM 40 MG PO TBEC
40.0000 mg | DELAYED_RELEASE_TABLET | Freq: Every day | ORAL | Status: DC
  Administered 2023-11-07 – 2023-11-17 (×14): 40 mg via ORAL
  Filled 2023-11-07 (×11): qty 1

## 2023-11-07 MED ORDER — METFORMIN HCL 500 MG PO TABS
1000.0000 mg | ORAL_TABLET | Freq: Two times a day (BID) | ORAL | Status: DC
  Administered 2023-11-07 – 2023-11-17 (×25): 1000 mg via ORAL
  Filled 2023-11-07 (×20): qty 2

## 2023-11-07 MED ORDER — SERTRALINE HCL 25 MG PO TABS
150.0000 mg | ORAL_TABLET | Freq: Every day | ORAL | Status: DC
  Administered 2023-11-07 – 2023-11-17 (×14): 150 mg via ORAL
  Filled 2023-11-07 (×11): qty 2

## 2023-11-07 MED ORDER — INSULIN ASPART 100 UNIT/ML IJ SOLN
0.0000 [IU] | Freq: Three times a day (TID) | INTRAMUSCULAR | Status: DC
  Administered 2023-11-07: 2 [IU] via SUBCUTANEOUS
  Administered 2023-11-07 (×2): 8 [IU] via SUBCUTANEOUS
  Administered 2023-11-07 (×2): 15 [IU] via SUBCUTANEOUS
  Administered 2023-11-07: 2 [IU] via SUBCUTANEOUS
  Administered 2023-11-09: 3 [IU] via SUBCUTANEOUS
  Administered 2023-11-09 (×3): 8 [IU] via SUBCUTANEOUS
  Administered 2023-11-09: 3 [IU] via SUBCUTANEOUS
  Administered 2023-11-09: 8 [IU] via SUBCUTANEOUS
  Administered 2023-11-10: 2 [IU] via SUBCUTANEOUS
  Administered 2023-11-10: 8 [IU] via SUBCUTANEOUS
  Administered 2023-11-10 – 2023-11-11 (×2): 3 [IU] via SUBCUTANEOUS
  Administered 2023-11-11: 5 [IU] via SUBCUTANEOUS
  Administered 2023-11-11: 2 [IU] via SUBCUTANEOUS
  Administered 2023-11-12 (×2): 3 [IU] via SUBCUTANEOUS
  Administered 2023-11-12: 5 [IU] via SUBCUTANEOUS
  Administered 2023-11-13 (×2): 3 [IU] via SUBCUTANEOUS
  Administered 2023-11-13: 8 [IU] via SUBCUTANEOUS
  Administered 2023-11-14: 3 [IU] via SUBCUTANEOUS
  Administered 2023-11-14 (×2): 5 [IU] via SUBCUTANEOUS
  Administered 2023-11-15: 3 [IU] via SUBCUTANEOUS
  Administered 2023-11-15: 5 [IU] via SUBCUTANEOUS
  Administered 2023-11-15: 2 [IU] via SUBCUTANEOUS
  Administered 2023-11-16 (×2): 5 [IU] via SUBCUTANEOUS
  Administered 2023-11-16 – 2023-11-17 (×2): 3 [IU] via SUBCUTANEOUS
  Filled 2023-11-07 (×29): qty 1

## 2023-11-07 MED ORDER — LISINOPRIL 20 MG PO TABS
40.0000 mg | ORAL_TABLET | Freq: Every day | ORAL | Status: DC
  Administered 2023-11-07 – 2023-11-17 (×14): 40 mg via ORAL
  Filled 2023-11-07 (×11): qty 2

## 2023-11-07 MED ORDER — INSULIN ASPART 100 UNIT/ML IJ SOLN
0.0000 [IU] | Freq: Every day | INTRAMUSCULAR | Status: DC
  Administered 2023-11-09 (×2): 3 [IU] via SUBCUTANEOUS
  Administered 2023-11-10: 2 [IU] via SUBCUTANEOUS
  Administered 2023-11-12: 3 [IU] via SUBCUTANEOUS
  Administered 2023-11-13 – 2023-11-15 (×3): 2 [IU] via SUBCUTANEOUS
  Filled 2023-11-07 (×6): qty 1

## 2023-11-07 MED ORDER — GLIPIZIDE ER 5 MG PO TB24
5.0000 mg | ORAL_TABLET | Freq: Every day | ORAL | Status: DC
  Administered 2023-11-07 – 2023-11-15 (×12): 5 mg via ORAL
  Filled 2023-11-07 (×9): qty 1

## 2023-11-07 NOTE — B[facility] Counselor (Signed)
 Patient expressed financial stressors to CSW and his inability to pay for medications as well as food. Patient expressed an interest in EBT but expressed not knowing how to go about doing so. Patient provided with [facility] DSS EBT application to be completed.   CSW will fax once completed.   CSW touched base with [facility] [employer] at [place] and spoke with Media planner, Verneita who reported that patient will qualify for free and reduced medications. Technician reported that medications that are not free will only cost the patient $5, $10, $15 or $30. Verneita reported that If patient agrees, when he goes to pick up medications, he will be asked to complete an application.   This will be communicated to patient and team.   Alveta Kerns, MSW, LCSWA 11/07/2023 2:07 PM

## 2023-11-07 NOTE — Group Note (Signed)
 Date:  11/07/2023 Time:  10:25 AM  Group Topic/Focus:  Goals Group:   The focus of this group is to help patients establish daily goals to achieve during treatment and discuss how the patient can incorporate goal setting into their daily lives to aide in recovery.    Participation Level:  Active  Participation Quality:  Appropriate  Affect:  Appropriate  Cognitive:  Alert  Insight: Appropriate  Engagement in Group:  Engaged  Modes of Intervention:  Activity, Discussion, and Education  Additional Comments:     [name] Bennett 11/07/2023, 10:25 AM

## 2023-11-07 NOTE — Group Note (Signed)
 Date:  11/07/2023 Time:  9:18 PM  Group Topic/Focus:  Personal Choices and Values:   The focus of this group is to help patients assess and explore the importance of values in their lives, how their values affect their decisions, how they express their values and what opposes their expression.    PT did not attend group.   Brady Schiller L 11/07/2023, 9:18 PM

## 2023-11-07 NOTE — Progress Notes (Signed)
   11/07/23 1733  Danger to Self  Current suicidal ideation? Active  Description of Suicide Plan overdose on fentanyl   Self-Injurious Behavior No self-injurious ideation or behavior indicators observed or expressed   Agreement Not to Harm Self Yes (i'm not going to do it in here. I wouldn't do that to you all.)  Description of Agreement verbal

## 2023-11-07 NOTE — Plan of Care (Signed)
  Problem: Education: Goal: Verbalization of understanding the information provided will improve Outcome: Progressing   Problem: Activity: Goal: Interest or engagement in activities will improve Outcome: Progressing Goal: Sleeping patterns will improve Outcome: Progressing   Problem: Health Behavior/Discharge Planning: Goal: Compliance with treatment plan for underlying cause of condition will improve Outcome: Progressing   Problem: Education: Goal: Emotional status will improve Outcome: Not Progressing Goal: Mental status will improve Outcome: Not Progressing   Problem: Coping: Goal: Ability to verbalize frustrations and anger appropriately will improve Outcome: Not Progressing Note: Patient endorsing SI and stating he's going to overdose on fentanyl  when he's discharged. Patient stated, I'm not worth living. I've ruined everything I've touched. Just let me go.
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
## 2023-11-07 NOTE — Group Note (Signed)
 [facility] LCSW Group Therapy Note    Group Date: 11/07/2023 Start Time: 1300 End Time: 1400  Type of Therapy and Topic:  Group Therapy:  Overcoming Obstacles  Participation Level:  [facility] PARTICIPATION LEVEL: Active  Mood:  Description of Group:   In this group patients will be encouraged to explore what they see as obstacles to their own wellness and recovery. They will be guided to discuss their thoughts, feelings, and behaviors related to these obstacles. The group will process together ways to cope with barriers, with attention given to specific choices patients can make. Each patient will be challenged to identify changes they are motivated to make in order to overcome their obstacles. This group will be process-oriented, with patients participating in exploration of their own experiences as well as giving and receiving support and challenge from other group members.  Therapeutic Goals: 1. Patient will identify personal and current obstacles as they relate to admission. 2. Patient will identify barriers that currently interfere with their wellness or overcoming obstacles.  3. Patient will identify feelings, thought process and behaviors related to these barriers. 4. Patient will identify two changes they are willing to make to overcome these obstacles:    Summary of Patient Progress Patient was present in group. Patient was engaged and supportive.  Patient shared that he uses walking as a coping skill.  He was appropriate and engaged in discussion. Insight appeared fair.    Therapeutic Modalities:   Cognitive Behavioral Therapy Solution Focused Therapy Motivational Interviewing Relapse Prevention Therapy   Sherryle JINNY Margo, LCSW

## 2023-11-07 NOTE — BH IP Treatment Plan (Signed)
 Interdisciplinary Treatment and Diagnostic Plan Update  11/07/2023 Time of Session: 10:18AM [name] MRN: [id number]  Principal Diagnosis: MDD (major depressive disorder)  Secondary Diagnoses: Principal Problem:   MDD (major depressive disorder)   Current Medications:  Current Facility-Administered Medications  Medication Dose Route Frequency Provider Last Rate Last Admin   acetaminophen  (TYLENOL ) tablet 650 mg  650 mg Oral Q6H PRN Ajibola, Ene A, NP   650 mg at 11/07/23 0816   alum & mag hydroxide-simeth (MAALOX/MYLANTA) 200-200-20 MG/5ML suspension 30 mL  30 mL Oral Q4H PRN Ajibola, Ene A, NP       ARIPiprazole  (ABILIFY ) tablet 10 mg  10 mg Oral Daily Ajibola, Ene A, NP   10 mg at 11/07/23 0816   atorvastatin  (LIPITOR) tablet 40 mg  40 mg Oral Daily Ajibola, Ene A, NP   40 mg at 11/07/23 9183   glipiZIDE  (GLUCOTROL  XL) 24 hr tablet 5 mg  5 mg Oral Daily Ajibola, Ene A, NP   5 mg at 11/07/23 0816   hydrOXYzine  (ATARAX ) tablet 25 mg  25 mg Oral TID PRN Ajibola, Ene A, NP       insulin  aspart (novoLOG ) injection 0-15 Units  0-15 Units Subcutaneous TID WC Ajibola, Ene A, NP   15 Units at 11/07/23 0816   insulin  aspart (novoLOG ) injection 0-5 Units  0-5 Units Subcutaneous QHS Ajibola, Ene A, NP       lisinopril  (ZESTRIL ) tablet 40 mg  40 mg Oral Daily Ajibola, Ene A, NP   40 mg at 11/07/23 9185   magnesium  hydroxide (MILK OF MAGNESIA) suspension 30 mL  30 mL Oral Daily PRN Ajibola, Ene A, NP       metFORMIN  (GLUCOPHAGE ) tablet 1,000 mg  1,000 mg Oral BID WC Ajibola, Ene A, NP   1,000 mg at 11/07/23 0815   pantoprazole  (PROTONIX ) EC tablet 40 mg  40 mg Oral Daily Ajibola, Ene A, NP   40 mg at 11/07/23 0815   sertraline  (ZOLOFT ) tablet 150 mg  150 mg Oral Daily Ajibola, Ene A, NP   150 mg at 11/07/23 0815   traZODone  (DESYREL ) tablet 50 mg  50 mg Oral QHS PRN Ajibola, Ene A, NP       PTA Medications: Medications Prior to Admission  Medication Sig Dispense Refill Last Dose/Taking    ARIPiprazole  (ABILIFY ) 10 MG tablet Take 1 tablet (10 mg total) by mouth daily. 30 tablet 0    atorvastatin  (LIPITOR) 40 MG tablet Take 1 tablet (40 mg total) by mouth daily. 90 tablet 3    glipiZIDE  (GLUCOTROL  XL) 5 MG 24 hr tablet Take 1 tablet (5 mg total) by mouth daily. 30 tablet 0    lisinopril  (ZESTRIL ) 40 MG tablet Take 1 tablet (40 mg total) by mouth daily. 30 tablet 0    metFORMIN  (GLUCOPHAGE ) 1000 MG tablet Take 1 tablet (1,000 mg total) by mouth 2 (two) times daily with a meal. 60 tablet 0    pantoprazole  (PROTONIX ) 40 MG tablet Take 1 tablet (40 mg total) by mouth daily. 30 tablet 0    sertraline  (ZOLOFT ) 50 MG tablet Take 3 tablets (150 mg total) by mouth daily. 90 tablet 0    traZODone  (DESYREL ) 50 MG tablet Take 1 tablet (50 mg total) by mouth at bedtime as needed for sleep. 30 tablet 0     Patient Stressors:    Patient Strengths:    Treatment Modalities: Medication Management, Group therapy, Case management,  1 to 1 session with clinician, Psychoeducation,  Recreational therapy.   Physician Treatment Plan for Primary Diagnosis: MDD (major depressive disorder) Long Term Goal(s):     Short Term Goals:    Medication Management: Evaluate patient's response, side effects, and tolerance of medication regimen.  Therapeutic Interventions: 1 to 1 sessions, Unit Group sessions and Medication administration.  Evaluation of Outcomes: Not Met  Physician Treatment Plan for Secondary Diagnosis: Principal Problem:   MDD (major depressive disorder)  Long Term Goal(s):     Short Term Goals:       Medication Management: Evaluate patient's response, side effects, and tolerance of medication regimen.  Therapeutic Interventions: 1 to 1 sessions, Unit Group sessions and Medication administration.  Evaluation of Outcomes: Not Met   RN Treatment Plan for Primary Diagnosis: MDD (major depressive disorder) Long Term Goal(s): Knowledge of disease and therapeutic regimen to maintain  health will improve  Short Term Goals: Ability to demonstrate self-control, Ability to participate in decision making will improve, Ability to verbalize feelings will improve, Ability to disclose and discuss suicidal ideas, Ability to identify and develop effective coping behaviors will improve, and Compliance with prescribed medications will improve  Medication Management: RN will administer medications as ordered by provider, will assess and evaluate patient's response and provide education to patient for prescribed medication. RN will report any adverse and/or side effects to prescribing provider.  Therapeutic Interventions: 1 on 1 counseling sessions, Psychoeducation, Medication administration, Evaluate responses to treatment, Monitor vital signs and CBGs as ordered, Perform/monitor CIWA, COWS, AIMS and Fall Risk screenings as ordered, Perform wound care treatments as ordered.  Evaluation of Outcomes: Not Met   LCSW Treatment Plan for Primary Diagnosis: MDD (major depressive disorder) Long Term Goal(s): Safe transition to appropriate next level of care at discharge, Engage patient in therapeutic group addressing interpersonal concerns.  Short Term Goals: Engage patient in aftercare planning with referrals and resources, Increase social support, Increase ability to appropriately verbalize feelings, Increase emotional regulation, Facilitate acceptance of mental health diagnosis and concerns, and Increase skills for wellness and recovery  Therapeutic Interventions: Assess for all discharge needs, 1 to 1 time with Social worker, Explore available resources and support systems, Assess for adequacy in community support network, Educate family and significant other(s) on suicide prevention, Complete Psychosocial Assessment, Interpersonal group therapy.  Evaluation of Outcomes: Not Met   Progress in Treatment: Attending groups: Yes. Participating in groups: Yes. Taking medication as prescribed:  Yes. Toleration medication: Yes. Family/Significant other contact made: No, will contact:  once permission has been granted Patient understands diagnosis: Yes. Discussing patient identified problems/goals with staff: Yes. Medical problems stabilized or resolved: Yes. Denies suicidal/homicidal ideation: Yes. Issues/concerns per patient self-inventory: No. Other: none  New problem(s) identified: No, Describe:  none  New Short Term/Long Term Goal(s): detox, elimination of symptoms of psychosis, medication management for mood stabilization; elimination of SI thoughts; development of comprehensive mental wellness/sobriety plan.   Patient Goals:  to get my meds stabilized  Discharge Plan or Barriers: CSW to assist in the development of appropriate discharge plans.    Reason for Continuation of Hospitalization: Anxiety Depression Medication stabilization Suicidal ideation  Estimated Length of Stay: 1-7 days  Last 3 [place] Suicide Severity Risk Score: Flowsheet Row Admission (Current) from 11/06/2023 in [facility] Most recent reading at 11/06/2023 10:00 PM ED from 11/06/2023 in [facility] Most recent reading at 11/06/2023 11:48 AM [facility] Most recent reading at 08/26/2023  4:01 PM  C-SSRS RISK CATEGORY High Risk High Risk High Risk    Last PHQ 2/9 Scores:    03/10/2023   10:23 AM 02/22/2023    3:28 PM 05/04/2021    1:15 PM  Depression screen PHQ 2/9  Decreased Interest 0 2 0  Down, Depressed, Hopeless 2 2 1   PHQ - 2 Score 2 4 1   Altered sleeping 3 1 0  Tired, decreased energy 1 2 0  Change in appetite 1 1 0  Feeling bad or failure about yourself  3 2 1   Trouble concentrating 1 1 0  Moving slowly or fidgety/restless 0 1 0  Suicidal thoughts 2 2 0  PHQ-9 Score 13 14 2   Difficult doing work/chores Somewhat difficult Somewhat difficult Not difficult  at all    Scribe for Treatment Team: Sherryle JINNY Margo, LCSW 11/07/2023 11:11 AM

## 2023-11-07 NOTE — Progress Notes (Signed)
 Patient alert and oriented x 4, affect is depressed sad and flat, affect is congruent with mood, he currently denies SI/HI/AVH. Patient was admitted for suicidal ideation but currently denies SI/HI/AVH. Patient is receptive to staff, and he expressed need for help with his medication management. 15 minutes safety checks maintained.

## 2023-11-07 NOTE — Inpatient Diabetes Management (Signed)
 Inpatient Diabetes Program Recommendations  AACE/ADA: New Consensus Statement on Inpatient Glycemic Control (2015)  Target Ranges:  Prepandial:   less than 140 mg/dL      Peak postprandial:   less than 180 mg/dL (1-2 hours)      Critically ill patients:  140 - 180 mg/dL   Lab Results  Component Value Date   GLUCAP 365 (H) 11/07/2023   HGBA1C 10.6 (H) 11/06/2023    Latest Reference Range & Units 11/06/23 12:00 11/06/23 13:12 11/06/23 15:45 11/06/23 21:38 11/07/23 07:40  Glucose-Capillary 70 - 99 mg/dL 648 (H) 626 (H) 730 (H) 160 (H) 365 (H)  (H): Data is abnormally high  Latest Reference Range & Units 02/22/23 16:07 08/25/23 12:55 11/06/23 11:00  Hemoglobin A1C 4.8 - 5.6 % 11.6 (H) 9.4 (H) 10.6 (H)  (H): Data is abnormally high Diabetes history: DM2 Outpatient Diabetes medications: Metformin  1 gm bid, Glipizide  5 mg daily Current orders for Inpatient glycemic control: Metformin  1 gm bid, Glipizide  5 mg daily Novolog  0-15 units tid, 0-5 units hs  Inpatient Diabetes Program Recommendations:   Please consider if fasting CBG >180: -Add Semglee 15 units daily  Thank you, Dagoberto E. Khristine Verno, RN, MSN, CDCES  Diabetes Coordinator Inpatient Glycemic Control Team Team Pager [phone] (8am-5pm) 11/07/2023 10:16 AM

## 2023-11-07 NOTE — Group Note (Signed)
 Recreation Therapy Group Note   Group Topic:Coping Skills  Group Date: 11/07/2023 Start Time: 1530 End Time: 1550 Facilitators: Celestia Jeoffrey FORBES ARTICE, CTRS Location: Craft Room  Group Description: Mind Map.  Patient was provided a blank template of a diagram with 32 blank boxes in a tiered system, branching from the center (similar to a bubble chart). LRT directed patients to label the middle of the diagram Coping Skills. LRT and patients then came up with 8 different coping skills as examples. Pt were directed to record their coping skills in the 2nd tier boxes closest to the center.  Patients would then share their coping skills with the group as LRT wrote them out. LRT gave a handout of 99 different coping skills at the end of group.   Goal Area(s) Addressed: Patients will be able to define "coping skills". Patient will identify new coping skills.  Patient will increase communication.   Affect/Mood: N/A   Participation Level: Did not attend    Clinical Observations/Individualized Feedback: Patient did not attend group.   Plan: Continue to engage patient in RT group sessions 2-3x/week.   Jeoffrey FORBES Celestia, LRT, CTRS 11/07/2023 5:31 PM

## 2023-11-07 NOTE — BHH Suicide Risk Assessment (Signed)
 [facility] Admission Suicide Risk Assessment   Nursing information obtained from:  Patient Demographic factors:  Male Current Mental Status:  Suicide plan Loss Factors:  Financial problems / change in socioeconomic status, Decline in physical health Historical Factors:  Impulsivity Risk Reduction Factors:  Positive coping skills or problem solving skills  Total Time spent with patient: 30 minutes Principal Problem: MDD (major depressive disorder) Diagnosis:  Principal Problem:   MDD (major depressive disorder)  Subjective Data: 63 year old [name] presents to [facility] via [facility] for suicidal ideations with a plan to overdose on fentanyl  he can buy off the street. Reported negative intrusive thoughts started yesterday (11/05/2023) and worsen overnight with progressive suicidal thought Patient is admitted to adult psych unit with Q15 min safety monitoring. Multidisciplinary team approach is offered. Medication management; group/milieu therapy is offered.   Continued Clinical Symptoms:  Alcohol Use Disorder Identification Test Final Score (AUDIT): 0 The Alcohol Use Disorders Identification Test, Guidelines for Use in Primary Care, Second Edition.  World Science writer [facility]). Score between 0-7:  no or low risk or alcohol related problems. Score between 8-15:  moderate risk of alcohol related problems. Score between 16-19:  high risk of alcohol related problems. Score 20 or above:  warrants further diagnostic evaluation for alcohol dependence and treatment.   CLINICAL FACTORS:   Depression:   Hopelessness   Musculoskeletal: Strength & Muscle Tone: within normal limits Gait & Station: normal Patient leans: N/A  Psychiatric Specialty Exam:  Presentation  General Appearance:  Appropriate for Environment; Disheveled  Eye Contact: Fair  Speech: Clear and Coherent  Speech Volume: Normal  Handedness: Right   Mood and Affect  Mood: Depressed;  Anxious  Affect: Congruent   Thought Process  Thought Processes: Coherent  Descriptions of Associations:Intact  Orientation:Full (Time, Place and Person)  Thought Content:Logical  History of Schizophrenia/Schizoaffective disorder:No  Duration of Psychotic Symptoms:No data recorded Hallucinations:Hallucinations: None  Ideas of Reference:None  Suicidal Thoughts:Suicidal Thoughts: Yes, Active SI Active Intent and/or Plan: Without Intent; With Plan  Homicidal Thoughts:Homicidal Thoughts: No   Sensorium  Memory: Immediate Fair  Judgment: Fair  Insight: Fair   Chartered certified accountant: Fair  Attention Span: Fair  Recall: [employer] of Knowledge: Fair  Language: Fair   Psychomotor Activity  Psychomotor Activity: Psychomotor Activity: Normal   Assets  Assets: Manufacturing systems engineer; Desire for Improvement; Financial Resources/Insurance; Intimacy; Physical Health; Resilience; Social Support   Sleep  Sleep: Sleep: Fair    Physical Exam: Physical Exam Vitals and nursing note reviewed.    ROS Blood pressure (!) 155/87, pulse 71, temperature (!) 96.8 F (36 C), resp. rate 18, height 5' 8 (1.727 m), weight 108.4 kg, SpO2 100%. Body mass index is 36.34 kg/m.   COGNITIVE FEATURES THAT CONTRIBUTE TO RISK:  None    SUICIDE RISK:   Minimal: No identifiable suicidal ideation.  Patients presenting with no risk factors but with morbid ruminations; may be classified as minimal risk based on the severity of the depressive symptoms  PLAN OF CARE: Patient is admitted to adult psych unit with Q15 min safety monitoring. Multidisciplinary team approach is offered. Medication management; group/milieu therapy is offered.   I certify that inpatient services furnished can reasonably be expected to improve the patient's condition.   Allyn Foil, MD 11/07/2023, 10:55 PM
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
## 2023-11-07 NOTE — Group Note (Signed)
 Recreation Therapy Group Note   Group Topic:Health and Wellness  Group Date: 11/07/2023 Start Time: 1040 End Time: 1140 Facilitators: Celestia Jeoffrey BRAVO, LRT, CTRS Location: Courtyard  Group Description: [employer]. LRT and patients played games of basketball, drew with chalk, and played corn hole while outside in the courtyard while getting fresh air and sunlight. Music was being played in the background. LRT and peers conversed about different games they have played before, what they do in their free time and anything else that is on their minds. LRT encouraged pts to drink water after being outside, sweating and getting their heart rate up.  Goal Area(s) Addressed: Patient will build on frustration tolerance skills. Patients will partake in a competitive play game with peers. Patients will gain knowledge of new leisure interest/hobby.    Affect/Mood: Appropriate   Participation Level: Active   Participation Quality: Independent   Behavior: Appropriate   Speech/Thought Process: Coherent   Insight: Fair   Judgement: Fair    Modes of Intervention: Activity   Patient Response to Interventions:  Receptive   Education Outcome:  Acknowledges education   Clinical Observations/Individualized Feedback: [name] was active in their participation of session activities and group discussion. Pt interacted well with LRT and peers duration of session.    Plan: Continue to engage patient in RT group sessions 2-3x/week.   Jeoffrey BRAVO Celestia, LRT, CTRS 11/07/2023 1:20 PM

## 2023-11-07 NOTE — Progress Notes (Signed)
   11/07/23 1053  Psych Admission Type (Psych Patients Only)  Admission Status Voluntary  Psychosocial Assessment  Patient Complaints Anxiety;Depression  Eye Contact Fair  Facial Expression Flat  Affect Flat  Speech Logical/coherent  Interaction Assertive  Motor Activity Slow  Appearance/Hygiene Body odor;Disheveled  Behavior Characteristics Cooperative  Mood Pleasant  Aggressive Behavior  Effect No apparent injury  Thought Process  Coherency WDL  Content WDL  Delusions None reported or observed  Perception WDL  Hallucination None reported or observed  Judgment WDL  Confusion None  Danger to Self  Current suicidal ideation? Denies  Self-Injurious Behavior No self-injurious ideation or behavior indicators observed or expressed   Agreement Not to Harm Self Yes  Description of Agreement verbal  Danger to Others  Danger to Others None reported or observed

## 2023-11-08 DIAGNOSIS — F329 Major depressive disorder, single episode, unspecified: Secondary | ICD-10-CM | POA: Diagnosis not present

## 2023-11-08 LAB — GLUCOSE, CAPILLARY
Glucose-Capillary: 227 mg/dL — ABNORMAL HIGH (ref 70–99)
Glucose-Capillary: 228 mg/dL — ABNORMAL HIGH (ref 70–99)
Glucose-Capillary: 153 mg/dL — ABNORMAL HIGH (ref 70–99)
Glucose-Capillary: 130 mg/dL — ABNORMAL HIGH (ref 70–99)

## 2023-11-08 NOTE — Progress Notes (Signed)
 [facility] MD Progress Note  11/08/2023 1:22 PM TILLMON KISLING  MRN:  [id number]  -year-old Burlin '[name]' Gilbert presents to [facility] via [employer] for suicidal ideations with a plan to overdose on fentanyl  he can buy off the street. Reported negative intrusive thoughts started yesterday (11/05/2023) and worsen overnight with progressive suicidal thought Patient is admitted to adult psych unit with Q15 min safety monitoring. Multidisciplinary team approach is offered. Medication management; group/milieu therapy is offered.  Subjective:  Chart reviewed, case discussed in multidisciplinary meeting, patient seen during rounds.  On interview patient is noted to be very restless and upset, tearful.  He was on the phone with the bank to change his account.  Number so that his girlfriend whom he met only online and who has access to his bank account is not going to withdraw his money as his disability check will be getting deposited soon.  Patient reports feeling hopeless and worthless and made multiple statements of being a mistake by God, eligible only to be cheated or taken advantage of [employer].  Patient made multiple statements of suicide and feeling hopeless.  He reports poor appetite and poor sleep.  He was unable to calm down.  He was directed to recreational therapy outdoors with music to practice some mindfulness.  He denies auditory/visual hallucinations.   Sleep: Fair  Appetite:  Fair  Past Psychiatric History: see h&P Family History:  Family History  Problem Relation Age of Onset   Arthritis Mother    Stroke Father 16       heavy smoker 4-5 packs a day unfiltered   Schizophrenia Father        homeless and did not get medical care in time   Alcoholism Father    Colon cancer Neg Hx    Esophageal cancer Neg Hx    Rectal cancer Neg Hx    Stomach cancer Neg Hx    Colon polyps Neg Hx    Social History:  Social History   Substance and Sexual Activity  Alcohol Use Yes   Alcohol/week: 5.0 standard drinks  of alcohol   Types: 2 Glasses of wine, 3 Cans of beer per week   Comment: SOCIAL 3 TIMES PER WEEK 2 DRINKS AT A TIME     Social History   Substance and Sexual Activity  Drug Use Not Currently   Types: Marijuana   Comment: HX OF MONTHS AGO    Social History   Socioeconomic History   Marital status: Single    Spouse name: Not on file   Number of children: Not on file   Years of education: Not on file   Highest education level: Bachelor's degree (e.g., BA, AB, BS)  Occupational History   Not on file  Tobacco Use   Smoking status: Former    Current packs/day: 0.00    Average packs/day: 0.5 packs/day for 5.0 years (2.5 ttl pk-yrs)    Types: Cigarettes    Start date: 05/20/1983    Quit date: 05/19/1988    Years since quitting: 35.4   Smokeless tobacco: Never   Tobacco comments:    quit in 1990  Vaping Use   Vaping status: Never Used  Substance and Sexual Activity   Alcohol use: Yes    Alcohol/week: 5.0 standard drinks of alcohol    Types: 2 Glasses of wine, 3 Cans of beer per week    Comment: SOCIAL 3 TIMES PER WEEK 2 DRINKS AT A TIME   Drug use: Not Currently  Types: Marijuana    Comment: HX OF MONTHS AGO   Sexual activity: Never  Other Topics Concern   Not on file  Social History Narrative   Single. 1 dog, 2 cats. Lives alone.       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 Enjoys dinner parties.    Social Drivers of Health   Financial Resource Strain: High Risk (08/09/2023)   Overall Financial Resource Strain (CARDIA)    Difficulty of Paying Living Expenses: Hard  Food Insecurity: Food Insecurity Present (11/07/2023)   Hunger Vital Sign    Worried About Running Out of Food in the Last Year: Sometimes true    Ran Out of Food  in the Last Year: Sometimes true  Transportation Needs: No Transportation Needs (11/07/2023)   PRAPARE - Administrator, Civil Service (Medical): No    Lack of Transportation (Non-Medical): No  Recent Concern: Transportation Needs - Unmet Transportation Needs (08/09/2023)   PRAPARE - Transportation    Lack of Transportation (Medical): Yes    Lack of Transportation (Non-Medical): Yes  Physical Activity: Sufficiently Active (08/09/2023)   Exercise Vital Sign    Days of Exercise per Week: 7 days    Minutes of Exercise per Session: 30 min  Stress: Stress Concern Present (08/09/2023)   [employer] of Occupational Health - Occupational Stress Questionnaire    Feeling of Stress : To some extent  Social Connections: Moderately Integrated (08/09/2023)   Social Connection and Isolation Panel    Frequency of Communication with Friends and Family: More than three times a week    Frequency of Social Gatherings with Friends and Family: Once a week    Attends Religious Services: More than 4 times per year    Active Member of Clubs or Organizations: Yes    Attends Engineer, structural: More than 4 times per year    Marital Status: Never married   Past Medical History:  Past Medical History:  Diagnosis Date   Cancer (HCC)    melanoma;  right ear   Depression    Diabetes mellitus    GERD (gastroesophageal reflux disease)    Hyperlipidemia    Hypertension    Metabolic syndrome    Obesity    PONV (postoperative nausea and vomiting)    YRS AGO WITHER ETHER    Past Surgical History:  Procedure Laterality Date   COLONOSCOPY  05/01/2013   CYST REMOVAL TRUNK  1989   benign   MELANOMA EXCISION  2008   right ear and neck   ORIF HUMERUS FRACTURE Left 12/23/2020   Procedure: OPEN REDUCTION INTERNAL FIXATION (ORIF) PROXIMAL HUMERUS FRACTURE;  Surgeon: [name], MD;  Location: [facility];  Service: Orthopedics;  Laterality: Left;    POLYPECTOMY     reconstructive  plastic surgery face  1967   dog bite at age 61; about 79 surgeries for 10 years   TONSILLECTOMY  1966   age 46    Current Medications: Current Facility-Administered Medications  Medication Dose Route Frequency Provider Last Rate Last Admin   acetaminophen  (TYLENOL ) tablet 650 mg  650 mg Oral Q6H PRN Ajibola, Ene A, NP   650 mg at 11/07/23 0816   alum & mag hydroxide-simeth (MAALOX/MYLANTA)  200-200-20 MG/5ML suspension 30 mL  30 mL Oral Q4H PRN Ajibola, Ene A, NP       ARIPiprazole  (ABILIFY ) tablet 10 mg  10 mg Oral Daily Ajibola, Ene A, NP   10 mg at 11/08/23 9178   atorvastatin  (LIPITOR) tablet 40 mg  40 mg Oral Daily Ajibola, Ene A, NP   40 mg at 11/08/23 0820   glipiZIDE  (GLUCOTROL  XL) 24 hr tablet 5 mg  5 mg Oral Daily Ajibola, Ene A, NP   5 mg at 11/08/23 9178   hydrOXYzine  (ATARAX ) tablet 25 mg  25 mg Oral TID PRN Ajibola, Ene A, NP   25 mg at 11/07/23 2053   insulin  aspart (novoLOG ) injection 0-15 Units  0-15 Units Subcutaneous TID WC Ajibola, Ene A, NP   2 Units at 11/07/23 1711   insulin  aspart (novoLOG ) injection 0-5 Units  0-5 Units Subcutaneous QHS Ajibola, Ene A, NP       lisinopril  (ZESTRIL ) tablet 40 mg  40 mg Oral Daily Ajibola, Ene A, NP   40 mg at 11/08/23 9178   magnesium  hydroxide (MILK OF MAGNESIA) suspension 30 mL  30 mL Oral Daily PRN Ajibola, Ene A, NP       metFORMIN  (GLUCOPHAGE ) tablet 1,000 mg  1,000 mg Oral BID WC Ajibola, Ene A, NP   1,000 mg at 11/08/23 0820   pantoprazole  (PROTONIX ) EC tablet 40 mg  40 mg Oral Daily Ajibola, Ene A, NP   40 mg at 11/08/23 9178   sertraline  (ZOLOFT ) tablet 150 mg  150 mg Oral Daily Ajibola, Ene A, NP   150 mg at 11/08/23 0820   traZODone  (DESYREL ) tablet 50 mg  50 mg Oral QHS PRN Ajibola, Ene A, NP   50 mg at 11/07/23 2053    Lab Results:  Results for orders placed or performed during the hospital encounter of 11/06/23 (from the past 48 hours)  Glucose, capillary     Status: Abnormal   Collection Time: 11/07/23  7:40 AM   Result Value Ref Range   Glucose-Capillary 365 (H) 70 - 99 mg/dL    Comment: Glucose reference range applies only to samples taken after fasting for at least 8 hours.  Glucose, capillary     Status: Abnormal   Collection Time: 11/07/23 11:39 AM  Result Value Ref Range   Glucose-Capillary 268 (H) 70 - 99 mg/dL    Comment: Glucose reference range applies only to samples taken after fasting for at least 8 hours.  Glucose, capillary     Status: Abnormal   Collection Time: 11/07/23  4:51 PM  Result Value Ref Range   Glucose-Capillary 144 (H) 70 - 99 mg/dL    Comment: Glucose reference range applies only to samples taken after fasting for at least 8 hours.  Glucose, capillary     Status: Abnormal   Collection Time: 11/07/23  8:50 PM  Result Value Ref Range   Glucose-Capillary 149 (H) 70 - 99 mg/dL    Comment: Glucose reference range applies only to samples taken after fasting for at least 8 hours.  Glucose, capillary     Status: Abnormal   Collection Time: 11/08/23  6:36 AM  Result Value Ref Range   Glucose-Capillary 227 (H) 70 - 99 mg/dL    Comment: Glucose reference range applies only to samples taken after fasting for at least 8 hours.  Glucose, capillary     Status: Abnormal   Collection Time: 11/08/23 11:59 AM  Result Value Ref Range   Glucose-Capillary  228 (H) 70 - 99 mg/dL    Comment: Glucose reference range applies only to samples taken after fasting for at least 8 hours.   Comment 1 Notify RN     Blood Alcohol level:  Lab Results  Component Value Date   [facility] <15 11/06/2023   ETH <15 08/25/2023    Metabolic Disorder Labs: Lab Results  Component Value Date   HGBA1C 10.6 (H) 11/06/2023   MPG 258 11/06/2023   MPG 223.08 08/25/2023   No results found for: PROLACTIN Lab Results  Component Value Date   CHOL 279 (H) 11/06/2023   TRIG 282 (H) 11/06/2023   HDL 60 11/06/2023   CHOLHDL 4.7 11/06/2023   VLDL 56 (H) 11/06/2023   LDLCALC 163 (H) 11/06/2023   LDLCALC 57  08/25/2023    Physical Findings: AIMS:  , ,  ,  ,    CIWA:    COWS:      Psychiatric Specialty Exam:  Presentation  General Appearance:  Appropriate for Environment; Disheveled  Eye Contact: Fair  Speech: Clear and Coherent  Speech Volume: Normal    Mood and Affect  Mood: Depressed; Anxious  Affect: Congruent   Thought Process  Thought Processes: Coherent  Descriptions of Associations:Intact  Orientation:Full (Time, Place and Person)  Thought Content:Logical  Hallucinations:No data recorded Ideas of Reference:None  Suicidal Thoughts:No data recorded Homicidal Thoughts:No data recorded  Sensorium  Memory: Immediate Fair  Judgment: Fair  Insight: Fair   Art therapist  Concentration: Fair  Attention Span: Fair  Recall: [employer] of Knowledge: Fair  Language: Fair   Psychomotor Activity  Psychomotor Activity:No data recorded Musculoskeletal: Strength & Muscle Tone: within normal limits Gait & Station: normal Assets  Assets: Manufacturing systems engineer; Desire for Improvement; Financial Resources/Insurance; Intimacy; Physical Health; Resilience; Social Support    Physical Exam: Physical Exam Vitals and nursing note reviewed.    ROS Blood pressure 133/66, pulse (!) 57, temperature (!) 97 F (36.1 C), resp. rate 16, height 5' 8 (1.727 m), weight 108.4 kg, SpO2 95%. Body mass index is 36.34 kg/m.  Diagnosis: Principal Problem:   MDD (major depressive disorder)   Safety and Monitoring:             -- Voluntary admission to inpatient psychiatric unit for safety, stabilization and treatment             -- Daily contact with patient to assess and evaluate symptoms and progress in treatment             -- Patient's case to be discussed in multi-disciplinary team meeting             -- Observation Level: q15 minute checks             -- Vital signs:  q12 hours             -- Precautions: suicide, elopement, and assault    2. Psychiatric Diagnoses and Treatment:                Abilify  10 mg daily Zoloft  150 mg daily   -- The risks/benefits/side-effects/alternatives to this medication were discussed in detail with the patient and time was given for questions. The patient consents to medication trial.                -- Metabolic profile and EKG monitoring obtained while on an atypical antipsychotic (BMI: Lipid Panel: HbgA1c: QTc:)              --  Encouraged patient to participate in unit milieu and in scheduled group therapies  4. Discharge Planning:   -- Social work and case management to assist with discharge planning and identification of hospital follow-up needs prior to discharge  -- Estimated LOS: 3-4 days  Waldine Zenz, MD 11/08/2023, 1:22 PM

## 2023-11-08 NOTE — Progress Notes (Signed)
   11/08/23 0400  Psych Admission Type (Psych Patients Only)  Admission Status Voluntary  Psychosocial Assessment  Patient Complaints Depression  Eye Contact Fair  Facial Expression Flat  Affect Flat  Speech Logical/coherent  Interaction Assertive  Motor Activity Slow  Appearance/Hygiene Body odor;Disheveled  Behavior Characteristics Cooperative  Mood Pleasant  Aggressive Behavior  Effect No apparent injury  Thought Process  Coherency WDL  Content WDL  Delusions None reported or observed  Perception WDL  Hallucination None reported or observed  Judgment WDL  Confusion None  Danger to Self  Current suicidal ideation? Denies  Self-Injurious Behavior No self-injurious ideation or behavior indicators observed or expressed   Agreement Not to Harm Self Yes  Danger to Others  Danger to Others None reported or observed

## 2023-11-08 NOTE — Group Note (Signed)
 Recreation Therapy Group Note   Group Topic:General Recreation  Group Date: 11/08/2023 Start Time: 1000 End Time: 1110 Facilitators: Celestia Jeoffrey BRAVO, LRT, CTRS Location: Courtyard  Group Description: [employer]. LRT and patients played games of basketball, drew with chalk, and played corn hole while outside in the courtyard while getting fresh air and sunlight. Music was being played in the background. LRT and peers conversed about different games they have played before, what they do in their free time and anything else that is on their minds. LRT encouraged pts to drink water after being outside, sweating and getting their heart rate up.  Goal Area(s) Addressed: Patient will build on frustration tolerance skills. Patients will partake in a competitive play game with peers. Patients will gain knowledge of new leisure interest/hobby.    Affect/Mood: Depressed   Participation Level: Minimal    Clinical Observations/Individualized Feedback: [name] came late to group. Pt shared that he was depressed and wanted to die, MD aware.   Plan: Continue to engage patient in RT group sessions 2-3x/week.   Jeoffrey BRAVO Celestia, LRT, CTRS 11/08/2023 11:51 AM

## 2023-11-08 NOTE — Plan of Care (Signed)
   Problem: Education: Goal: Knowledge of [facility] Education information/materials will improve Outcome: Progressing Goal: Emotional status will improve Outcome: Progressing Goal: Mental status will improve Outcome: Progressing Goal: Verbalization of understanding the information provided will improve Outcome: Progressing

## 2023-11-08 NOTE — Group Note (Signed)
 Recreation Therapy Group Note   Group Topic:Coping Skills  Group Date: 11/08/2023 Start Time: 1530 End Time: 1620 Facilitators: Celestia Jeoffrey FORBES ARTICE, CTRS Location: Craft Room  Group Description: Coping A-Z. LRT and patients engage in a guided discussion on what coping skills are and gave specific examples. LRT passed out a handout labeled Coping A-Z with blank spaces beside each letter. LRT prompted patients to come up with a coping skill for each of the letters. LRT and patients went over the handout and gave ideas for each letter if anyone had any blanks left on their paper. Patients kept this handout with them that listed 26 different coping skills.   Goal Area(s) Addressed: Patients will be able to define "coping skills". Patient will identify new coping skills.  Patient will increase communication.   Affect/Mood: Appropriate   Participation Level: Active and Engaged   Participation Quality: Independent   Behavior: Appropriate, Calm, and Cooperative   Speech/Thought Process: Coherent   Insight: Good   Judgement: Good   Modes of Intervention: Education, Exploration, Worksheet, and Writing   Patient Response to Interventions:  Attentive, Engaged, Interested , and Receptive   Education Outcome:  Acknowledges education   Clinical Observations/Individualized Feedback: [name] was active in their participation of session activities and group discussion. Pt identified watching animal videos and baking as coping skills.   Plan: Continue to engage patient in RT group sessions 2-3x/week.   Jeoffrey FORBES Celestia, LRT, CTRS 11/08/2023 4:56 PM

## 2023-11-08 NOTE — Plan of Care (Signed)
   Problem: Education: Goal: Emotional status will improve Outcome: Not Progressing Goal: Mental status will improve Outcome: Not Progressing   Problem: Activity: Goal: Interest or engagement in activities will improve Outcome: Not Progressing

## 2023-11-08 NOTE — Group Note (Signed)
 [facility] LCSW Group Therapy Note   Group Date: 11/08/2023 Start Time: 1300 End Time: 1345  Type of Therapy/Topic:  Group Therapy:  Feelings about Diagnosis  Participation Level:  Did Not Attend    Description of Group:    This group will allow patients to explore their thoughts and feelings about diagnoses they have received. Patients will be guided to explore their level of understanding and acceptance of these diagnoses. Facilitator will encourage patients to process their thoughts and feelings about the reactions of others to their diagnosis, and will guide patients in identifying ways to discuss their diagnosis with significant others in their lives. This group will be process-oriented, with patients participating in exploration of their own experiences as well as giving and receiving support and challenge from other group members.   Therapeutic Goals: 1. Patient will demonstrate understanding of diagnosis as evidence by identifying two or more symptoms of the disorder:  2. Patient will be able to express two feelings regarding the diagnosis 3. Patient will demonstrate ability to communicate their needs through discussion and/or role plays  Summary of Patient Progress: X   Therapeutic Modalities:   Cognitive Behavioral Therapy Brief Therapy Feelings Identification    Nadara JONELLE Fam, LCSW

## 2023-11-08 NOTE — Group Note (Signed)
 Date:  11/08/2023 Time:  10:05 AM  Group Topic/Focus:  Goals Group:   The focus of this group is to help patients establish daily goals to achieve during treatment and discuss how the patient can incorporate goal setting into their daily lives to aide in recovery.    Participation Level:  Active  Participation Quality:  Appropriate  Affect:  Appropriate  Cognitive:  Appropriate  Insight: Appropriate  Engagement in Group:  Engaged  Modes of Intervention:  Discussion, Education, and Support  Additional Comments:    [name] 11/08/2023, 10:05 AM

## 2023-11-08 NOTE — [facility] Counselor (Signed)
 ACTT referrals sent to [facility] on patient's behalf.   CSW to continue to assess.   Zana Biancardi, MSW, LCSWA 11/08/2023 3:35 PM

## 2023-11-08 NOTE — Group Note (Signed)
 Date:  11/08/2023 Time:  11:31 PM  Group Topic/Focus:  Wrap-Up Group:   The focus of this group is to help patients review their daily goal of treatment and discuss progress on daily workbooks.    Participation Level:  Minimal  Participation Quality:  Appropriate and Attentive  Affect:  Appropriate  Cognitive:  Appropriate  Insight: Appropriate  Engagement in Group:  Limited  Modes of Intervention:  Discussion and Orientation  Additional Comments:     [name] 11/08/2023, 11:31 PM

## 2023-11-08 NOTE — Progress Notes (Signed)
 [facility] MD Progress Note  11/08/2023 1:22 PM [name]  MRN:  [id number]  -year-old [name] presents to [facility] via [employer] for suicidal ideations with a plan to overdose on fentanyl  he can buy off the street. Reported negative intrusive thoughts started yesterday (11/05/2023) and worsen overnight with progressive suicidal thought Patient is admitted to adult psych unit with Q15 min safety monitoring. Multidisciplinary team approach is offered. Medication management; group/milieu therapy is offered.  Subjective:  Chart reviewed, case discussed in multidisciplinary meeting, patient seen during rounds.  On interview patient is noted to be very restless and upset, tearful.  He was on the phone with the bank to change his account.  Number so that his girlfriend whom he met only online and who has access to his bank account is not going to withdraw his money as his disability check will be getting deposited soon.  Patient reports feeling hopeless and worthless and made multiple statements of being a mistake by God, eligible only to be cheated or taken advantage of excetra.  Patient made multiple statements of suicide and feeling hopeless.  He reports poor appetite and poor sleep.  He was unable to calm down.  He was directed to recreational therapy outdoors with music to practice some mindfulness.  He denies auditory/visual hallucinations.   Sleep: Fair  Appetite:  Fair  Past Psychiatric History: see h&P Family History:  Family History  Problem Relation Age of Onset   Arthritis Mother    Stroke Father 16       heavy smoker 4-5 packs a day unfiltered   Schizophrenia Father        homeless and did not get medical care in time   Alcoholism Father    Colon cancer Neg Hx    Esophageal cancer Neg Hx    Rectal cancer Neg Hx    Stomach cancer Neg Hx    Colon polyps Neg Hx    Social History:  Social History   Substance and Sexual Activity  Alcohol Use Yes   Alcohol/week: 5.0 standard drinks  of alcohol   Types: 2 Glasses of wine, 3 Cans of beer per week   Comment: SOCIAL 3 TIMES PER WEEK 2 DRINKS AT A TIME     Social History   Substance and Sexual Activity  Drug Use Not Currently   Types: Marijuana   Comment: HX OF MONTHS AGO    Social History   Socioeconomic History   Marital status: Single    Spouse name: Not on file   Number of children: Not on file   Years of education: Not on file   Highest education level: Bachelor's degree (e.g., BA, AB, BS)  Occupational History   Not on file  Tobacco Use   Smoking status: Former    Current packs/day: 0.00    Average packs/day: 0.5 packs/day for 5.0 years (2.5 ttl pk-yrs)    Types: Cigarettes    Start date: 05/20/1983    Quit date: 05/19/1988    Years since quitting: 35.4   Smokeless tobacco: Never   Tobacco comments:    quit in 1990  Vaping Use   Vaping status: Never Used  Substance and Sexual Activity   Alcohol use: Yes    Alcohol/week: 5.0 standard drinks of alcohol    Types: 2 Glasses of wine, 3 Cans of beer per week    Comment: SOCIAL 3 TIMES PER WEEK 2 DRINKS AT A TIME   Drug use: Not Currently  Types: Marijuana    Comment: HX OF MONTHS AGO   Sexual activity: Never  Other Topics Concern   Not on file  Social History Narrative   Single. 1 dog, 2 cats. Lives alone.       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 Enjoys dinner parties.    Social Drivers of Health   Financial Resource Strain: High Risk (08/09/2023)   Overall Financial Resource Strain (CARDIA)    Difficulty of Paying Living Expenses: Hard  Food Insecurity: Food Insecurity Present (11/07/2023)   Hunger Vital Sign    Worried About Running Out of Food in the Last Year: Sometimes true    Ran Out of Food  in the Last Year: Sometimes true  Transportation Needs: No Transportation Needs (11/07/2023)   PRAPARE - Administrator, Civil Service (Medical): No    Lack of Transportation (Non-Medical): No  Recent Concern: Transportation Needs - Unmet Transportation Needs (08/09/2023)   PRAPARE - Transportation    Lack of Transportation (Medical): Yes    Lack of Transportation (Non-Medical): Yes  Physical Activity: Sufficiently Active (08/09/2023)   Exercise Vital Sign    Days of Exercise per Week: 7 days    Minutes of Exercise per Session: 30 min  Stress: Stress Concern Present (08/09/2023)   [employer] of Occupational Health - Occupational Stress Questionnaire    Feeling of Stress : To some extent  Social Connections: Moderately Integrated (08/09/2023)   Social Connection and Isolation Panel    Frequency of Communication with Friends and Family: More than three times a week    Frequency of Social Gatherings with Friends and Family: Once a week    Attends Religious Services: More than 4 times per year    Active Member of Clubs or Organizations: Yes    Attends Engineer, structural: More than 4 times per year    Marital Status: Never married   Past Medical History:  Past Medical History:  Diagnosis Date   Cancer (HCC)    melanoma;  right ear   Depression    Diabetes mellitus    GERD (gastroesophageal reflux disease)    Hyperlipidemia    Hypertension    Metabolic syndrome    Obesity    PONV (postoperative nausea and vomiting)    YRS AGO WITHER ETHER    Past Surgical History:  Procedure Laterality Date   COLONOSCOPY  05/01/2013   CYST REMOVAL TRUNK  1989   benign   MELANOMA EXCISION  2008   right ear and neck   ORIF HUMERUS FRACTURE Left 12/23/2020   Procedure: OPEN REDUCTION INTERNAL FIXATION (ORIF) PROXIMAL HUMERUS FRACTURE;  Surgeon: [name], MD;  Location: [facility];  Service: Orthopedics;  Laterality: Left;    POLYPECTOMY     reconstructive  plastic surgery face  1967   dog bite at age 61; about 79 surgeries for 10 years   TONSILLECTOMY  1966   age 46    Current Medications: Current Facility-Administered Medications  Medication Dose Route Frequency Provider Last Rate Last Admin   acetaminophen  (TYLENOL ) tablet 650 mg  650 mg Oral Q6H PRN Ajibola, Ene A, NP   650 mg at 11/07/23 0816   alum & mag hydroxide-simeth (MAALOX/MYLANTA)  200-200-20 MG/5ML suspension 30 mL  30 mL Oral Q4H PRN Ajibola, Ene A, NP       ARIPiprazole  (ABILIFY ) tablet 10 mg  10 mg Oral Daily Ajibola, Ene A, NP   10 mg at 11/08/23 9178   atorvastatin  (LIPITOR) tablet 40 mg  40 mg Oral Daily Ajibola, Ene A, NP   40 mg at 11/08/23 0820   glipiZIDE  (GLUCOTROL  XL) 24 hr tablet 5 mg  5 mg Oral Daily Ajibola, Ene A, NP   5 mg at 11/08/23 9178   hydrOXYzine  (ATARAX ) tablet 25 mg  25 mg Oral TID PRN Ajibola, Ene A, NP   25 mg at 11/07/23 2053   insulin  aspart (novoLOG ) injection 0-15 Units  0-15 Units Subcutaneous TID WC Ajibola, Ene A, NP   2 Units at 11/07/23 1711   insulin  aspart (novoLOG ) injection 0-5 Units  0-5 Units Subcutaneous QHS Ajibola, Ene A, NP       lisinopril  (ZESTRIL ) tablet 40 mg  40 mg Oral Daily Ajibola, Ene A, NP   40 mg at 11/08/23 9178   magnesium  hydroxide (MILK OF MAGNESIA) suspension 30 mL  30 mL Oral Daily PRN Ajibola, Ene A, NP       metFORMIN  (GLUCOPHAGE ) tablet 1,000 mg  1,000 mg Oral BID WC Ajibola, Ene A, NP   1,000 mg at 11/08/23 0820   pantoprazole  (PROTONIX ) EC tablet 40 mg  40 mg Oral Daily Ajibola, Ene A, NP   40 mg at 11/08/23 9178   sertraline  (ZOLOFT ) tablet 150 mg  150 mg Oral Daily Ajibola, Ene A, NP   150 mg at 11/08/23 0820   traZODone  (DESYREL ) tablet 50 mg  50 mg Oral QHS PRN Ajibola, Ene A, NP   50 mg at 11/07/23 2053    Lab Results:  Results for orders placed or performed during the hospital encounter of 11/06/23 (from the past 48 hours)  Glucose, capillary     Status: Abnormal   Collection Time: 11/07/23  7:40 AM   Result Value Ref Range   Glucose-Capillary 365 (H) 70 - 99 mg/dL    Comment: Glucose reference range applies only to samples taken after fasting for at least 8 hours.  Glucose, capillary     Status: Abnormal   Collection Time: 11/07/23 11:39 AM  Result Value Ref Range   Glucose-Capillary 268 (H) 70 - 99 mg/dL    Comment: Glucose reference range applies only to samples taken after fasting for at least 8 hours.  Glucose, capillary     Status: Abnormal   Collection Time: 11/07/23  4:51 PM  Result Value Ref Range   Glucose-Capillary 144 (H) 70 - 99 mg/dL    Comment: Glucose reference range applies only to samples taken after fasting for at least 8 hours.  Glucose, capillary     Status: Abnormal   Collection Time: 11/07/23  8:50 PM  Result Value Ref Range   Glucose-Capillary 149 (H) 70 - 99 mg/dL    Comment: Glucose reference range applies only to samples taken after fasting for at least 8 hours.  Glucose, capillary     Status: Abnormal   Collection Time: 11/08/23  6:36 AM  Result Value Ref Range   Glucose-Capillary 227 (H) 70 - 99 mg/dL    Comment: Glucose reference range applies only to samples taken after fasting for at least 8 hours.  Glucose, capillary     Status: Abnormal   Collection Time: 11/08/23 11:59 AM  Result Value Ref Range   Glucose-Capillary  228 (H) 70 - 99 mg/dL    Comment: Glucose reference range applies only to samples taken after fasting for at least 8 hours.   Comment 1 Notify RN     Blood Alcohol level:  Lab Results  Component Value Date   [facility] <15 11/06/2023   ETH <15 08/25/2023    Metabolic Disorder Labs: Lab Results  Component Value Date   HGBA1C 10.6 (H) 11/06/2023   MPG 258 11/06/2023   MPG 223.08 08/25/2023   No results found for: PROLACTIN Lab Results  Component Value Date   CHOL 279 (H) 11/06/2023   TRIG 282 (H) 11/06/2023   HDL 60 11/06/2023   CHOLHDL 4.7 11/06/2023   VLDL 56 (H) 11/06/2023   LDLCALC 163 (H) 11/06/2023   LDLCALC 57  08/25/2023    Physical Findings: AIMS:  , ,  ,  ,    CIWA:    COWS:      Psychiatric Specialty Exam:  Presentation  General Appearance:  Appropriate for Environment; Disheveled  Eye Contact: Fair  Speech: Clear and Coherent  Speech Volume: Normal    Mood and Affect  Mood: Depressed; Anxious  Affect: Congruent   Thought Process  Thought Processes: Coherent  Descriptions of Associations:Intact  Orientation:Full (Time, Place and Person)  Thought Content:Logical  Hallucinations:No data recorded Ideas of Reference:None  Suicidal Thoughts:No data recorded Homicidal Thoughts:No data recorded  Sensorium  Memory: Immediate Fair  Judgment: Fair  Insight: Fair   Art therapist  Concentration: Fair  Attention Span: Fair  Recall: [employer] of Knowledge: Fair  Language: Fair   Psychomotor Activity  Psychomotor Activity:No data recorded Musculoskeletal: Strength & Muscle Tone: within normal limits Gait & Station: normal Assets  Assets: Manufacturing systems engineer; Desire for Improvement; Financial Resources/Insurance; Intimacy; Physical Health; Resilience; Social Support    Physical Exam: Physical Exam Vitals and nursing note reviewed.    ROS Blood pressure 133/66, pulse (!) 57, temperature (!) 97 F (36.1 C), resp. rate 16, height 5' 8 (1.727 m), weight 108.4 kg, SpO2 95%. Body mass index is 36.34 kg/m.  Diagnosis: Principal Problem:   MDD (major depressive disorder)   Safety and Monitoring:             -- Voluntary admission to inpatient psychiatric unit for safety, stabilization and treatment             -- Daily contact with patient to assess and evaluate symptoms and progress in treatment             -- Patient's case to be discussed in multi-disciplinary team meeting             -- Observation Level: q15 minute checks             -- Vital signs:  q12 hours             -- Precautions: suicide, elopement, and assault    2. Psychiatric Diagnoses and Treatment:                Abilify  10 mg daily Zoloft  150 mg daily   -- The risks/benefits/side-effects/alternatives to this medication were discussed in detail with the patient and time was given for questions. The patient consents to medication trial.                -- Metabolic profile and EKG monitoring obtained while on an atypical antipsychotic (BMI: Lipid Panel: HbgA1c: QTc:)              --  Encouraged patient to participate in unit milieu and in scheduled group therapies  4. Discharge Planning:   -- Social work and case management to assist with discharge planning and identification of hospital follow-up needs prior to discharge  -- Estimated LOS: 3-4 days  Waldine Zenz, MD 11/08/2023, 1:22 PM

## 2023-11-09 DIAGNOSIS — F329 Major depressive disorder, single episode, unspecified: Secondary | ICD-10-CM | POA: Diagnosis not present

## 2023-11-09 LAB — GLUCOSE, CAPILLARY
Glucose-Capillary: 297 mg/dL — ABNORMAL HIGH (ref 70–99)
Glucose-Capillary: 286 mg/dL — ABNORMAL HIGH (ref 70–99)
Glucose-Capillary: 196 mg/dL — ABNORMAL HIGH (ref 70–99)
Glucose-Capillary: 309 mg/dL — ABNORMAL HIGH (ref 70–99)

## 2023-11-09 NOTE — Group Note (Signed)
 Date:  11/09/2023 Time:  4:12 PM  Group Topic/Focus:  Activity Group: The focus of the group is to promote activity for the patients and encourage them to go outside to the courtyard and get some fresh air and some exercise.    Participation Level:  Did Not Attend   [name] 11/09/2023, 4:12 PM

## 2023-11-09 NOTE — Plan of Care (Signed)
   Problem: Education: Goal: Knowledge of [facility] Education information/materials will improve Outcome: Progressing Goal: Emotional status will improve Outcome: Progressing Goal: Mental status will improve Outcome: Progressing Goal: Verbalization of understanding the information provided will improve Outcome: Progressing

## 2023-11-09 NOTE — Group Note (Signed)
 Date:  11/09/2023 Time:  9:01 PM  Group Topic/Focus:  Goals Group:   The focus of this group is to help patients establish daily goals to achieve during treatment and discuss how the patient can incorporate goal setting into their daily lives to aide in recovery.    Participation Level:  Did Not Attend  Participation Quality:    Affect:    Cognitive:    Insight:   Engagement in Group:    Modes of Intervention:    Additional Comments:    Kenedee Molesky 11/09/2023, 9:01 PM

## 2023-11-09 NOTE — Progress Notes (Signed)
   11/09/23 0900  Charting Type  Charting Type Shift assessment  Safety Check Verification  Has the RN verified the 15 minute safety check completion? Yes  Neurological  Neuro (WDL) X  Orientation Level Oriented X4  Neuro Symptoms Anxiety  HEENT  HEENT (WDL) X  R Ear Impaired hearing  Respiratory  Respiratory (WDL) WDL  Cardiac  Cardiac (WDL) WDL  Vascular  Vascular (WDL) WDL  Integumentary  Integumentary (WDL) WDL  Braden Scale (Ages 8 and up)  Sensory Perceptions 4  Moisture 4  Activity 3  Mobility 4  Nutrition 3  Friction and Shear 3  Braden Scale Score 21  Musculoskeletal  Musculoskeletal (WDL) WDL  Gastrointestinal  Gastrointestinal (WDL) WDL  Last BM Date  11/08/23  GU Assessment  Genitourinary (WDL) WDL  Neurological  Level of Consciousness Alert

## 2023-11-09 NOTE — Progress Notes (Signed)
   11/09/23 0600  Psych Admission Type (Psych Patients Only)  Admission Status Voluntary  Psychosocial Assessment  Patient Complaints Anxiety;Depression  Eye Contact Fair  Facial Expression Flat  Affect Anxious  Speech Logical/coherent  Interaction Assertive  Motor Activity Slow  Appearance/Hygiene Disheveled  Behavior Characteristics Cooperative;Appropriate to situation  Mood Depressed  Aggressive Behavior  Effect No apparent injury  Thought Process  Coherency WDL  Content WDL  Delusions None reported or observed  Perception WDL  Hallucination None reported or observed  Judgment WDL  Confusion None  Danger to Self  Current suicidal ideation? Active  Self-Injurious Behavior No self-injurious ideation or behavior indicators observed or expressed   Agreement Not to Harm Self Yes  Danger to Others  Danger to Others None reported or observed

## 2023-11-09 NOTE — Group Note (Signed)
 Date:  11/09/2023 Time:  4:12 PM  Group Topic/Focus:  Activity Group: The focus of the group is to promote activity for the patients and encourage them to go outside to the courtyard and get some fresh air and some exercise.    Participation Level:  Did Not Attend   Camellia HERO Daryel Kenneth 11/09/2023, 4:12 PM

## 2023-11-09 NOTE — Inpatient Diabetes Management (Addendum)
 Inpatient Diabetes Program Recommendations  AACE/ADA: New Consensus Statement on Inpatient Glycemic Control (2015)  Target Ranges:  Prepandial:   less than 140 mg/dL      Peak postprandial:   less than 180 mg/dL (1-2 hours)      Critically ill patients:  140 - 180 mg/dL    Latest Reference Range & Units 02/22/23 16:07 08/25/23 12:55 11/06/23 11:00  Hemoglobin A1C 4.8 - 5.6 % 11.6 (H) 9.4 (H) 10.6 (H)  (H): Data is abnormally high  Latest Reference Range & Units 11/08/23 06:36 11/08/23 11:59 11/08/23 16:53 11/08/23 20:48  Glucose-Capillary 70 - 99 mg/dL 772 (H)  Pt Refused Novolog  228 (H)  Pt Refused Novolog  153 (H)  Pt Refused Novolog  130 (H)  (H): Data is abnormally high  Latest Reference Range & Units 11/09/23 08:21  Glucose-Capillary 70 - 99 mg/dL 702 (H)  8 units Novolog    (H): Data is abnormally high   Home DM Meds: Glipizide  5 mg daily        Metformin  1000 mg BID  Current Orders: Glipizide  5 mg daily      Metformin  1000 mg BID      Novolog  Moderate Correction Scale/ SSI (0-15 units) TID AC + HS     Pt refused Novolog  all day yesterday Did agree to take Novolog  this AM  MD- Note AM CBGs are >200 May consider adding low dose basal insulin  for better in-hospital glucose control: Semglee 10 units at bedtime  Looks like pt has used 70/30 Insulin  BID in the past per PCP notes from 2024   --Will follow patient during hospitalization--  Adina Rudolpho Arrow RN, MSN, CDCES Diabetes Coordinator Inpatient Glycemic Control Team Team Pager: [phone] (8a-5p)

## 2023-11-09 NOTE — Progress Notes (Addendum)
 Pt affect and mood is labile, he was bright and pleasant early in the shift, and later c/o not doing well and stated he was suicidal  but gave a safety commitment. I will overdose on fentanyl  when I leave here, Im going to be evicted and  homeless and I can't live like this anymore. Pt is managed on q 15 min rounds.     11/09/23 1000  Psych Admission Type (Psych Patients Only)  Admission Status Voluntary  Date 72 hour document signed  11/08/23  Psychosocial Assessment  Patient Complaints Anxiety  Eye Contact Fair  Facial Expression Flat  Affect Anxious  Speech Logical/coherent  Interaction Assertive  Motor Activity Slow  Appearance/Hygiene Disheveled  Behavior Characteristics Cooperative;Appropriate to situation  Mood Sullen  Thought Process  Coherency WDL  Content WDL  Delusions None reported or observed  Perception WDL  Hallucination None reported or observed  Judgment WDL  Confusion None  Danger to Self  Current suicidal ideation? Denies  Agreement Not to Harm Self Yes  Description of Agreement verbal

## 2023-11-09 NOTE — Progress Notes (Signed)
 [facility] MD Progress Note  11/09/2023 6:26 PM [name]  MRN:  [id number]  -year-old Sem 'Dustin' Davis presents to [facility] via [facility] for suicidal ideations with a plan to overdose on fentanyl  he can buy off the street. Reported negative intrusive thoughts started yesterday (11/05/2023) and worsen overnight with progressive suicidal thought Patient is admitted to adult psych unit with Q15 min safety monitoring. Multidisciplinary team approach is offered. Medication management; group/milieu therapy is offered.  Subjective:  Chart reviewed, case discussed in multidisciplinary meeting, patient seen during rounds.  In the morning with this provider patient reports having a better morning but later on reports that in the afternoon he started having suicidal thoughts.  He did acknowledge that he is better than yesterday but has ongoing depression, feeling hopeless and worthless.  Per nursing report patient signed a 72 hours AMA paperwork on 11/08/2023.  During the treatment team given the patient's worsening depression and ongoing suicidal ideation and multiple psychosocial stressors it has been determined that involuntary commitment will be initiated if patient demands to leave.  Patient denies auditory/visual hallucinations.  Today he denies HI.   Sleep: Fair  Appetite:  Fair  Past Psychiatric History: see h&P Family History:  Family History  Problem Relation Age of Onset   Arthritis Mother    Stroke Father 43       heavy smoker 4-5 packs a day unfiltered   Schizophrenia Father        homeless and did not get medical care in time   Alcoholism Father    Colon cancer Neg Hx    Esophageal cancer Neg Hx    Rectal cancer Neg Hx    Stomach cancer Neg Hx    Colon polyps Neg Hx    Social History:  Social History   Substance and Sexual Activity  Alcohol Use Yes   Alcohol/week: 5.0 standard drinks of alcohol   Types: 2 Glasses of wine, 3 Cans of beer per week   Comment: SOCIAL 3 TIMES PER WEEK 2  DRINKS AT A TIME     Social History   Substance and Sexual Activity  Drug Use Not Currently   Types: Marijuana   Comment: HX OF MONTHS AGO    Social History   Socioeconomic History   Marital status: Single    Spouse name: Not on file   Number of children: Not on file   Years of education: Not on file   Highest education level: Bachelor's degree (e.g., BA, AB, BS)  Occupational History   Not on file  Tobacco Use   Smoking status: Former    Current packs/day: 0.00    Average packs/day: 0.5 packs/day for 5.0 years (2.5 ttl pk-yrs)    Types: Cigarettes    Start date: 05/20/1983    Quit date: 05/19/1988    Years since quitting: 35.4   Smokeless tobacco: Never   Tobacco comments:    quit in 1990  Vaping Use   Vaping status: Never Used  Substance and Sexual Activity   Alcohol use: Yes    Alcohol/week: 5.0 standard drinks of alcohol    Types: 2 Glasses of wine, 3 Cans of beer per week    Comment: SOCIAL 3 TIMES PER WEEK 2 DRINKS AT A TIME   Drug use: Not Currently    Types: Marijuana    Comment: HX OF MONTHS AGO   Sexual activity: Never  Other Topics Concern   Not on file  Social History Narrative   Single. 1  dog, 2 cats. Lives alone.       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 Enjoys dinner parties.    Social Drivers of Health   Financial Resource Strain: High Risk (08/09/2023)   Overall Financial Resource Strain (CARDIA)    Difficulty of Paying Living Expenses: Hard  Food Insecurity: Food Insecurity Present (11/07/2023)   Hunger Vital Sign    Worried About Running Out of Food in the Last Year: Sometimes true    Ran Out of Food in the Last Year: Sometimes true  Transportation Needs: No Transportation Needs (11/07/2023)   PRAPARE  - Administrator, Civil Service (Medical): No    Lack of Transportation (Non-Medical): No  Recent Concern: Transportation Needs - Unmet Transportation Needs (08/09/2023)   PRAPARE - Transportation    Lack of Transportation (Medical): Yes    Lack of Transportation (Non-Medical): Yes  Physical Activity: Sufficiently Active (08/09/2023)   Exercise Vital Sign    Days of Exercise per Week: 7 days    Minutes of Exercise per Session: 30 min  Stress: Stress Concern Present (08/09/2023)   [employer] of Occupational Health - Occupational Stress Questionnaire    Feeling of Stress : To some extent  Social Connections: Moderately Integrated (08/09/2023)   Social Connection and Isolation Panel    Frequency of Communication with Friends and Family: More than three times a week    Frequency of Social Gatherings with Friends and Family: Once a week    Attends Religious Services: More than 4 times per year    Active Member of Clubs or Organizations: Yes    Attends Engineer, structural: More than 4 times per year    Marital Status: Never married   Past Medical History:  Past Medical History:  Diagnosis Date   Cancer (HCC)    melanoma;  right ear   Depression    Diabetes mellitus    GERD (gastroesophageal reflux disease)    Hyperlipidemia    Hypertension    Metabolic syndrome    Obesity    PONV (postoperative nausea and vomiting)    YRS AGO WITHER ETHER    Past Surgical History:  Procedure Laterality Date   COLONOSCOPY  05/01/2013   CYST REMOVAL TRUNK  1989   benign   MELANOMA EXCISION  2008   right ear and neck   ORIF HUMERUS FRACTURE Left 12/23/2020   Procedure: OPEN REDUCTION INTERNAL FIXATION (ORIF) PROXIMAL HUMERUS FRACTURE;  Surgeon: [name], MD;  Location: [facility];  Service: Orthopedics;  Laterality: Left;    POLYPECTOMY     reconstructive plastic surgery face  1967   dog bite at age 20; about 82 surgeries for 10 years   TONSILLECTOMY  1966   age  67    Current Medications: Current Facility-Administered Medications  Medication Dose Route Frequency Provider Last Rate Last Admin   acetaminophen  (TYLENOL ) tablet 650 mg  650 mg Oral Q6H PRN Ajibola, Ene A, NP   650 mg at 11/09/23 1407   alum & mag hydroxide-simeth (MAALOX/MYLANTA) 200-200-20 MG/5ML suspension 30 mL  30 mL Oral Q4H PRN Ajibola, Ene A, NP       ARIPiprazole  (ABILIFY ) tablet 10 mg  10 mg Oral Daily Ajibola,  Ene A, NP   10 mg at 11/09/23 0830   atorvastatin  (LIPITOR) tablet 40 mg  40 mg Oral Daily Ajibola, Ene A, NP   40 mg at 11/09/23 0830   glipiZIDE  (GLUCOTROL  XL) 24 hr tablet 5 mg  5 mg Oral Daily Ajibola, Ene A, NP   5 mg at 11/09/23 0830   hydrOXYzine  (ATARAX ) tablet 25 mg  25 mg Oral TID PRN Ajibola, Ene A, NP   25 mg at 11/08/23 2056   insulin  aspart (novoLOG ) injection 0-15 Units  0-15 Units Subcutaneous TID WC Ajibola, Ene A, NP   3 Units at 11/09/23 1745   insulin  aspart (novoLOG ) injection 0-5 Units  0-5 Units Subcutaneous QHS Ajibola, Ene A, NP       lisinopril  (ZESTRIL ) tablet 40 mg  40 mg Oral Daily Ajibola, Ene A, NP   40 mg at 11/09/23 0830   magnesium  hydroxide (MILK OF MAGNESIA) suspension 30 mL  30 mL Oral Daily PRN Ajibola, Ene A, NP       metFORMIN  (GLUCOPHAGE ) tablet 1,000 mg  1,000 mg Oral BID WC Ajibola, Ene A, NP   1,000 mg at 11/09/23 1744   pantoprazole  (PROTONIX ) EC tablet 40 mg  40 mg Oral Daily Ajibola, Ene A, NP   40 mg at 11/09/23 0830   sertraline  (ZOLOFT ) tablet 150 mg  150 mg Oral Daily Ajibola, Ene A, NP   150 mg at 11/09/23 9170   traZODone  (DESYREL ) tablet 50 mg  50 mg Oral QHS PRN Ajibola, Ene A, NP   50 mg at 11/08/23 2056    Lab Results:  Results for orders placed or performed during the hospital encounter of 11/06/23 (from the past 48 hours)  Glucose, capillary     Status: Abnormal   Collection Time: 11/07/23  8:50 PM  Result Value Ref Range   Glucose-Capillary 149 (H) 70 - 99 mg/dL    Comment: Glucose reference range applies only  to samples taken after fasting for at least 8 hours.  Glucose, capillary     Status: Abnormal   Collection Time: 11/08/23  6:36 AM  Result Value Ref Range   Glucose-Capillary 227 (H) 70 - 99 mg/dL    Comment: Glucose reference range applies only to samples taken after fasting for at least 8 hours.  Glucose, capillary     Status: Abnormal   Collection Time: 11/08/23 11:59 AM  Result Value Ref Range   Glucose-Capillary 228 (H) 70 - 99 mg/dL    Comment: Glucose reference range applies only to samples taken after fasting for at least 8 hours.   Comment 1 Notify RN   Glucose, capillary     Status: Abnormal   Collection Time: 11/08/23  4:53 PM  Result Value Ref Range   Glucose-Capillary 153 (H) 70 - 99 mg/dL    Comment: Glucose reference range applies only to samples taken after fasting for at least 8 hours.  Glucose, capillary     Status: Abnormal   Collection Time: 11/08/23  8:48 PM  Result Value Ref Range   Glucose-Capillary 130 (H) 70 - 99 mg/dL    Comment: Glucose reference range applies only to samples taken after fasting for at least 8 hours.   Comment 1 Notify RN   Glucose, capillary     Status: Abnormal   Collection Time: 11/09/23  8:21 AM  Result Value Ref Range   Glucose-Capillary 297 (H) 70 - 99 mg/dL    Comment: Glucose reference range applies only to samples  taken after fasting for at least 8 hours.  Glucose, capillary     Status: Abnormal   Collection Time: 11/09/23 11:49 AM  Result Value Ref Range   Glucose-Capillary 286 (H) 70 - 99 mg/dL    Comment: Glucose reference range applies only to samples taken after fasting for at least 8 hours.  Glucose, capillary     Status: Abnormal   Collection Time: 11/09/23  4:31 PM  Result Value Ref Range   Glucose-Capillary 196 (H) 70 - 99 mg/dL    Comment: Glucose reference range applies only to samples taken after fasting for at least 8 hours.    Blood Alcohol level:  Lab Results  Component Value Date   [facility] <15 11/06/2023    ETH <15 08/25/2023    Metabolic Disorder Labs: Lab Results  Component Value Date   HGBA1C 10.6 (H) 11/06/2023   MPG 258 11/06/2023   MPG 223.08 08/25/2023   No results found for: PROLACTIN Lab Results  Component Value Date   CHOL 279 (H) 11/06/2023   TRIG 282 (H) 11/06/2023   HDL 60 11/06/2023   CHOLHDL 4.7 11/06/2023   VLDL 56 (H) 11/06/2023   LDLCALC 163 (H) 11/06/2023   LDLCALC 57 08/25/2023    Physical Findings: AIMS:  , ,  ,  ,    CIWA:    COWS:      Psychiatric Specialty Exam:  Presentation  General Appearance:  Appropriate for Environment; Disheveled  Eye Contact: Fair  Speech: Clear and Coherent  Speech Volume: Normal    Mood and Affect  Mood: Depressed; Anxious  Affect: Congruent   Thought Process  Thought Processes: Coherent  Descriptions of Associations:Intact  Orientation:Full (Time, Place and Person)  Thought Content:Logical  Hallucinations: Denies Ideas of Reference:None  Suicidal Thoughts: Endorses SI with different plans Homicidal Thoughts: Denies  Sensorium  Memory: Immediate Fair  Judgment: Fair  Insight: Fair   Art therapist  Concentration: Fair  Attention Span: Fair  Recall: Fair  Fund of Knowledge: Fair  Language: Fair   Psychomotor Activity  Psychomotor Activity:No data recorded Musculoskeletal: Strength & Muscle Tone: within normal limits Gait & Station: normal Assets  Assets: Manufacturing systems engineer; Desire for Improvement; Financial Resources/Insurance; Intimacy; Physical Health; Resilience; Social Support    Physical Exam: Physical Exam Vitals and nursing note reviewed.    ROS Blood pressure 131/79, pulse 75, temperature (!) 97.2 F (36.2 C), resp. rate 16, height 5' 8 (1.727 m), weight 108.4 kg, SpO2 100%. Body mass index is 36.34 kg/m.  Diagnosis: Principal Problem:   MDD (major depressive disorder)   Safety and Monitoring:             -- Voluntary admission to  inpatient psychiatric unit for safety, stabilization and treatment             -- Daily contact with patient to assess and evaluate symptoms and progress in treatment             -- Patient's case to be discussed in multi-disciplinary team meeting             -- Observation Level: q15 minute checks             -- Vital signs:  q12 hours             -- Precautions: suicide, elopement, and assault   2. Psychiatric Diagnoses and Treatment:                Abilify  10 mg daily Zoloft   150 mg daily   -- The risks/benefits/side-effects/alternatives to this medication were discussed in detail with the patient and time was given for questions. The patient consents to medication trial.                -- Metabolic profile and EKG monitoring obtained while on an atypical antipsychotic (BMI: Lipid Panel: HbgA1c: QTc:)              -- Encouraged patient to participate in unit milieu and in scheduled group therapies  4. Discharge Planning:   -- Social work and case management to assist with discharge planning and identification of hospital follow-up needs prior to discharge  -- Estimated LOS: 3-4 days  Jasan Doughtie, MD 11/09/2023, 6:26 PM

## 2023-11-09 NOTE — Group Note (Signed)
 LCSW Group Therapy Note   Group Date: 11/09/2023 Start Time: 1300 End Time: 1400   Type of Therapy and Topic:  Group Therapy: Challenging Core Beliefs  Participation Level:  Minimal  Description of Group:  Patients were educated about core beliefs and asked to identify one harmful core belief that they have. Patients were asked to explore from where those beliefs originate. Patients were asked to discuss how those beliefs make them feel and the resulting behaviors of those beliefs. They were then be asked if those beliefs are true and, if so, what evidence they have to support them. Lastly, group members were challenged to replace those negative core beliefs with helpful beliefs.   Therapeutic Goals:   1. Patient will identify harmful core beliefs and explore the origins of such beliefs. 2. Patient will identify feelings and behaviors that result from those core beliefs. 3. Patient will discuss whether such beliefs are true. 4.  Patient will replace harmful core beliefs with helpful ones.  Summary of Patient Progress:  Patient minimally engaged in processing and exploring how core beliefs are formed and how they impact thoughts, feelings, and behaviors. Patient proved open to input from peers and feedback from CSW. Patient demonstrated basic insight into the subject matter, was respectful and supportive of peers, and participated throughout the entire session.  Therapeutic Modalities: Cognitive Behavioral Therapy; Solution-Focused Therapy   Meda Dudzinski M Adael Culbreath, LCSWA 11/09/2023  3:08 PM

## 2023-11-09 NOTE — Group Note (Signed)
 Date:  11/09/2023 Time:  1:07 PM  Group Topic/Focus:  Building Self Esteem:   The Focus of this group is helping patients become aware of the effects of self-esteem on their lives, the things they and others do that enhance or undermine their self-esteem, seeing the relationship between their level of self-esteem and the choices they make and learning ways to enhance self-esteem.    Participation Level:  Active  Participation Quality:  Appropriate  Affect:  Appropriate  Cognitive:  Alert  Insight: Appropriate  Engagement in Group:  Engaged  Modes of Intervention:  Activity, Discussion, and Education  Additional Comments:    [name] 11/09/2023, 1:07 PM

## 2023-11-09 NOTE — Plan of Care (Signed)
  Problem: Education: Goal: Knowledge of [facility] Education information/materials will improve Outcome: Progressing Goal: Mental status will improve Outcome: Progressing   Problem: Coping: Goal: Ability to verbalize frustrations and anger appropriately will improve Outcome: Progressing   Problem: Physical Regulation: Goal: Ability to maintain clinical measurements within normal limits will improve Outcome: Progressing   Problem: Safety: Goal: Periods of time without injury will increase Outcome: Progressing   Problem: Metabolic: Goal: Ability to maintain appropriate glucose levels will improve Outcome: Not Met (add Reason)   Problem: Nutritional: Goal: Maintenance of adequate nutrition will improve Outcome: Progressing

## 2023-11-10 DIAGNOSIS — F329 Major depressive disorder, single episode, unspecified: Secondary | ICD-10-CM | POA: Diagnosis not present

## 2023-11-10 LAB — GLUCOSE, CAPILLARY
Glucose-Capillary: 157 mg/dL — ABNORMAL HIGH (ref 70–99)
Glucose-Capillary: 270 mg/dL — ABNORMAL HIGH (ref 70–99)
Glucose-Capillary: 138 mg/dL — ABNORMAL HIGH (ref 70–99)
Glucose-Capillary: 208 mg/dL — ABNORMAL HIGH (ref 70–99)

## 2023-11-10 MED ORDER — ARIPIPRAZOLE 5 MG PO TABS
5.0000 mg | ORAL_TABLET | Freq: Every day | ORAL | Status: DC
  Administered 2023-11-11 – 2023-11-17 (×7): 5 mg via ORAL
  Filled 2023-11-10 (×7): qty 1

## 2023-11-10 MED ORDER — ARIPIPRAZOLE 10 MG PO TABS
10.0000 mg | ORAL_TABLET | Freq: Every day | ORAL | Status: DC
  Administered 2023-11-11 – 2023-11-16 (×6): 10 mg via ORAL
  Filled 2023-11-10 (×6): qty 1

## 2023-11-10 NOTE — Group Note (Signed)
 Recreation Therapy Group Note   Group Topic:General Recreation  Group Date: 11/10/2023 Start Time: 1000 End Time: 1100 Facilitators: Celestia Jeoffrey BRAVO, LRT, CTRS Location: Courtyard  Group Description: [employer]. LRT and patients played games of basketball, drew with chalk, and played corn hole while outside in the courtyard while getting fresh air and sunlight. Music was being played in the background. LRT and peers conversed about different games they have played before, what they do in their free time and anything else that is on their minds. LRT encouraged pts to drink water after being outside, sweating and getting their heart rate up.  Goal Area(s) Addressed: Patient will build on frustration tolerance skills. Patients will partake in a competitive play game with peers. Patients will gain knowledge of new leisure interest/hobby.    Affect/Mood: Appropriate   Participation Level: Active   Participation Quality: Independent   Behavior: Appropriate   Speech/Thought Process: Coherent   Insight: Good   Judgement: Good   Modes of Intervention: Activity   Patient Response to Interventions:  Receptive   Education Outcome:  Acknowledges education   Clinical Observations/Individualized Feedback: [name] was active in their participation of session activities and group discussion. Pt interacted well with LRT and peers duration of session.    Plan: Continue to engage patient in RT group sessions 2-3x/week.   Jeoffrey BRAVO Celestia, LRT, CTRS 11/10/2023 11:52 AM

## 2023-11-10 NOTE — Progress Notes (Signed)
   11/10/23 1300  Psych Admission Type (Psych Patients Only)  Admission Status Voluntary  Psychosocial Assessment  Patient Complaints Anxiety  Eye Contact Fair  Facial Expression Flat  Affect Anxious  Speech Logical/coherent  Interaction Assertive  Motor Activity Slow  Appearance/Hygiene Other (Comment) (appropriate)  Behavior Characteristics Appropriate to situation;Cooperative  Mood Sullen  Thought Process  Coherency WDL  Content WDL  Delusions None reported or observed  Perception WDL  Hallucination None reported or observed  Judgment Poor  Confusion None  Danger to Self  Current suicidal ideation? Active  Agreement Not to Harm Self Yes  Description of Agreement  (verbal)  Danger to Others  Danger to Others None reported or observed

## 2023-11-10 NOTE — Group Note (Signed)
 LCSW Group Therapy Note   Group Date: 11/10/2023 Start Time: 0900 End Time: 1000   Type of Therapy and Topic:  Group Therapy: Boundaries  Participation Level:  Active  Description of Group: This group will address the use of boundaries in their personal lives. Patients will explore why boundaries are important, the difference between healthy and unhealthy boundaries, and negative and postive outcomes of different boundaries and will look at how boundaries can be crossed.  Patients will be encouraged to identify current boundaries in their own lives and identify what kind of boundary is being set. Facilitators will guide patients in utilizing problem-solving interventions to address and correct types boundaries being used and to address when no boundary is being used. Understanding and applying boundaries will be explored and addressed for obtaining and maintaining a balanced life. Patients will be encouraged to explore ways to assertively make their boundaries and needs known to significant others in their lives, using other group members and facilitator for role play, support, and feedback.  Therapeutic Goals:  1.  Patient will identify areas in their life where setting clear boundaries could be  used to improve their life.  2.  Patient will identify signs/triggers that a boundary is not being respected. 3.  Patient will identify two ways to set boundaries in order to achieve balance in  their lives: 4.  Patient will demonstrate ability to communicate their needs and set boundaries  through discussion and/or role plays  Summary of Patient Progress:   Patient was present and active throughout the session and proved open to feedback from CSW and peers. Patient demonstrated fair insight into the subject matter, was respectful of peers, and was present throughout the entire session.  Therapeutic Modalities:   Cognitive Behavioral Therapy Solution-Focused Therapy  Sherryle JINNY Margo,  LCSW 11/10/2023  10:37 AM

## 2023-11-10 NOTE — Group Note (Signed)
 Recreation Therapy Group Note   Group Topic:Healthy Support Systems  Group Date: 11/10/2023 Start Time: 1530 End Time: 1615 Facilitators: Celestia Jeoffrey FORBES ARTICE, CTRS Location: Craft Room  Group Description: Straw Bridge. In groups or individually, patients were given 10 plastic drinking straws and an equal length of masking tape. Using the materials provided, patients were instructed to build a free-standing bridge-like structure to suspend an everyday item (ex: deck of cards) off the floor or table surface. All materials were required to be used in Secondary school teacher. LRT facilitated post-activity discussion reviewing the importance of having strong and healthy support systems in our lives. LRT discussed how the people in our lives serve as the tape and the deck of cards we placed on top of our straw structure are the stressors we face in daily life. LRT and pts discussed what happens in our life when things get too heavy for us , and we don't have strong supports outside of the hospital. Pt shared 2 of their healthy supports in their life aloud in the group.   Goal Area(s) Addressed:  Patient will identify 2 healthy supports in their life. Patient will identify skills to successfully complete activity. Patient will identify correlation of this activity to life post-discharge.  Patient will build on frustration tolerance skills. Patient will increase team building and communication skills.   Affect/Mood: Appropriate   Participation Level: Active and Engaged   Participation Quality: Independent   Behavior: Appropriate, Calm, and Cooperative   Speech/Thought Process: Coherent   Insight: Good and Improved   Judgement: Good and Improved   Modes of Intervention: STEM Activity   Patient Response to Interventions:  Attentive, Engaged, Interested , and Receptive   Education Outcome:  Acknowledges education   Clinical Observations/Individualized Feedback: [name] was active in their participation  of session activities and group discussion. Pt identified church and friends as healthy supports.    Plan: Continue to engage patient in RT group sessions 2-3x/week.   Jeoffrey FORBES Celestia, LRT, CTRS 11/10/2023 5:40 PM

## 2023-11-10 NOTE — Plan of Care (Signed)
   Problem: Education: Goal: Knowledge of [facility] Education information/materials will improve Outcome: Progressing Goal: Emotional status will improve Outcome: Progressing Goal: Mental status will improve Outcome: Progressing Goal: Verbalization of understanding the information provided will improve Outcome: Progressing

## 2023-11-10 NOTE — Group Note (Signed)
 Date:  11/10/2023 Time:  2:50 PM  Group Topic/Focus:  Early Warning Signs:   The focus of this group is to help patients identify signs or symptoms they exhibit before slipping into an unhealthy state or crisis. Identifying Needs:   The focus of this group is to help patients identify their personal needs that have been historically problematic and identify healthy behaviors to address their needs.    Participation Level:  Active  Participation Quality:  Appropriate  Affect:  Appropriate  Cognitive:  Appropriate  Insight: Appropriate  Engagement in Group:  Engaged  Modes of Intervention:  Exploration and Socialization  Additional Comments:    Deitra Caron Mainland 11/10/2023, 2:50 PM

## 2023-11-10 NOTE — Progress Notes (Signed)
 [facility] MD Progress Note  11/10/2023 9:38 PM [name]  MRN:  [id number]  -year-old [name] presents to [facility] via [facility] for suicidal ideations with a plan to overdose on fentanyl  he can buy off the street. Reported negative intrusive thoughts started yesterday (11/05/2023) and worsen overnight with progressive suicidal thought Patient is admitted to adult psych unit with Q15 min safety monitoring. Multidisciplinary team approach is offered. Medication management; group/milieu therapy is offered.   Subjective:  Chart reviewed, case discussed in multidisciplinary meeting, patient seen during rounds.  On interview patient is noted to be resting in bed.  he reports having a better day today.  He rates his anxiety as 7 out of 10, 10 being the worst and anxiety as 6 out of 10.  He reports ongoing suicidal ideation with intermittent urge to act on them and denies auditory/visual hallucinations.  He reports having fair appetite and sleep.  Per nursing report they expressed concern that patient is having unstable gait and risk of falls.  PT evaluation will be recommended.   Sleep: Fair  Appetite:  Fair  Past Psychiatric History: see h&P Family History:  Family History  Problem Relation Age of Onset   Arthritis Mother    Stroke Father 48       heavy smoker 4-5 packs a day unfiltered   Schizophrenia Father        homeless and did not get medical care in time   Alcoholism Father    Colon cancer Neg Hx    Esophageal cancer Neg Hx    Rectal cancer Neg Hx    Stomach cancer Neg Hx    Colon polyps Neg Hx    Social History:  Social History   Substance and Sexual Activity  Alcohol Use Yes   Alcohol/week: 5.0 standard drinks of alcohol   Types: 2 Glasses of wine, 3 Cans of beer per week   Comment: SOCIAL 3 TIMES PER WEEK 2 DRINKS AT A TIME     Social History   Substance and Sexual Activity  Drug Use Not Currently   Types: Marijuana   Comment: HX OF MONTHS AGO    Social History    Socioeconomic History   Marital status: Single    Spouse name: Not on file   Number of children: Not on file   Years of education: Not on file   Highest education level: Bachelor's degree (e.g., BA, AB, BS)  Occupational History   Not on file  Tobacco Use   Smoking status: Former    Current packs/day: 0.00    Average packs/day: 0.5 packs/day for 5.0 years (2.5 ttl pk-yrs)    Types: Cigarettes    Start date: 05/20/1983    Quit date: 05/19/1988    Years since quitting: 35.5   Smokeless tobacco: Never   Tobacco comments:    quit in 1990  Vaping Use   Vaping status: Never Used  Substance and Sexual Activity   Alcohol use: Yes    Alcohol/week: 5.0 standard drinks of alcohol    Types: 2 Glasses of wine, 3 Cans of beer per week    Comment: SOCIAL 3 TIMES PER WEEK 2 DRINKS AT A TIME   Drug use: Not Currently    Types: Marijuana    Comment: HX OF MONTHS AGO   Sexual activity: Never  Other Topics Concern   Not on file  Social History Narrative   Single. 1 dog, 2 cats. Lives alone.  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 Enjoys dinner parties.    Social Drivers of Health   Financial Resource Strain: High Risk (08/09/2023)   Overall Financial Resource Strain (CARDIA)    Difficulty of Paying Living Expenses: Hard  Food Insecurity: Food Insecurity Present (11/07/2023)   Hunger Vital Sign    Worried About Running Out of Food in the Last Year: Sometimes true    Ran Out of Food in the Last Year: Sometimes true  Transportation Needs: No Transportation Needs (11/07/2023)   PRAPARE - Administrator, Civil Service (Medical): No    Lack of Transportation (Non-Medical): No  Recent Concern: Transportation Needs - Unmet Transportation  Needs (08/09/2023)   PRAPARE - Transportation    Lack of Transportation (Medical): Yes    Lack of Transportation (Non-Medical): Yes  Physical Activity: Sufficiently Active (08/09/2023)   Exercise Vital Sign    Days of Exercise per Week: 7 days    Minutes of Exercise per Session: 30 min  Stress: Stress Concern Present (08/09/2023)   [employer] of Occupational Health - Occupational Stress Questionnaire    Feeling of Stress : To some extent  Social Connections: Moderately Integrated (08/09/2023)   Social Connection and Isolation Panel    Frequency of Communication with Friends and Family: More than three times a week    Frequency of Social Gatherings with Friends and Family: Once a week    Attends Religious Services: More than 4 times per year    Active Member of Clubs or Organizations: Yes    Attends Engineer, structural: More than 4 times per year    Marital Status: Never married   Past Medical History:  Past Medical History:  Diagnosis Date   Cancer (HCC)    melanoma;  right ear   Depression    Diabetes mellitus    GERD (gastroesophageal reflux disease)    Hyperlipidemia    Hypertension    Metabolic syndrome    Obesity    PONV (postoperative nausea and vomiting)    YRS AGO WITHER ETHER    Past Surgical History:  Procedure Laterality Date   COLONOSCOPY  05/01/2013   CYST REMOVAL TRUNK  1989   benign   MELANOMA EXCISION  2008   right ear and neck   ORIF HUMERUS FRACTURE Left 12/23/2020   Procedure: OPEN REDUCTION INTERNAL FIXATION (ORIF) PROXIMAL HUMERUS FRACTURE;  Surgeon: [name], MD;  Location: [facility];  Service: Orthopedics;  Laterality: Left;    POLYPECTOMY     reconstructive plastic surgery face  1967   dog bite at age 34; about 63 surgeries for 10 years   TONSILLECTOMY  1966   age 39    Current Medications: Current Facility-Administered Medications  Medication Dose Route Frequency Provider Last Rate Last Admin   acetaminophen  (TYLENOL )  tablet 650 mg  650 mg Oral Q6H PRN Ajibola, Ene A, NP   650 mg at 11/10/23 2122   alum & mag hydroxide-simeth (MAALOX/MYLANTA) 200-200-20 MG/5ML suspension 30 mL  30 mL Oral Q4H PRN Ajibola, Ene A, NP       [START ON 11/11/2023] ARIPiprazole  (ABILIFY ) tablet 10 mg  10 mg Oral QHS Aalijah Mims, MD       [  START ON 11/11/2023] ARIPiprazole  (ABILIFY ) tablet 5 mg  5 mg Oral Daily Brinden Kincheloe, MD       atorvastatin  (LIPITOR) tablet 40 mg  40 mg Oral Daily Ajibola, Ene A, NP   40 mg at 11/10/23 0853   glipiZIDE  (GLUCOTROL  XL) 24 hr tablet 5 mg  5 mg Oral Daily Ajibola, Ene A, NP   5 mg at 11/10/23 0853   hydrOXYzine  (ATARAX ) tablet 25 mg  25 mg Oral TID PRN Ajibola, Ene A, NP   25 mg at 11/08/23 2056   insulin  aspart (novoLOG ) injection 0-15 Units  0-15 Units Subcutaneous TID WC Ajibola, Ene A, NP   2 Units at 11/10/23 1747   insulin  aspart (novoLOG ) injection 0-5 Units  0-5 Units Subcutaneous QHS Ajibola, Ene A, NP   2 Units at 11/10/23 2121   lisinopril  (ZESTRIL ) tablet 40 mg  40 mg Oral Daily Ajibola, Ene A, NP   40 mg at 11/10/23 0854   magnesium  hydroxide (MILK OF MAGNESIA) suspension 30 mL  30 mL Oral Daily PRN Ajibola, Ene A, NP       metFORMIN  (GLUCOPHAGE ) tablet 1,000 mg  1,000 mg Oral BID WC Ajibola, Ene A, NP   1,000 mg at 11/10/23 1747   pantoprazole  (PROTONIX ) EC tablet 40 mg  40 mg Oral Daily Ajibola, Ene A, NP   40 mg at 11/10/23 0853   sertraline  (ZOLOFT ) tablet 150 mg  150 mg Oral Daily Ajibola, Ene A, NP   150 mg at 11/10/23 0854   traZODone  (DESYREL ) tablet 50 mg  50 mg Oral QHS PRN Ajibola, Ene A, NP   50 mg at 11/10/23 2122    Lab Results:  Results for orders placed or performed during the hospital encounter of 11/06/23 (from the past 48 hours)  Glucose, capillary     Status: Abnormal   Collection Time: 11/09/23  8:21 AM  Result Value Ref Range   Glucose-Capillary 297 (H) 70 - 99 mg/dL    Comment: Glucose reference range applies only to samples taken after fasting for at  least 8 hours.  Glucose, capillary     Status: Abnormal   Collection Time: 11/09/23 11:49 AM  Result Value Ref Range   Glucose-Capillary 286 (H) 70 - 99 mg/dL    Comment: Glucose reference range applies only to samples taken after fasting for at least 8 hours.  Glucose, capillary     Status: Abnormal   Collection Time: 11/09/23  4:31 PM  Result Value Ref Range   Glucose-Capillary 196 (H) 70 - 99 mg/dL    Comment: Glucose reference range applies only to samples taken after fasting for at least 8 hours.  Glucose, capillary     Status: Abnormal   Collection Time: 11/09/23  8:21 PM  Result Value Ref Range   Glucose-Capillary 309 (H) 70 - 99 mg/dL    Comment: Glucose reference range applies only to samples taken after fasting for at least 8 hours.  Glucose, capillary     Status: Abnormal   Collection Time: 11/10/23  6:19 AM  Result Value Ref Range   Glucose-Capillary 157 (H) 70 - 99 mg/dL    Comment: Glucose reference range applies only to samples taken after fasting for at least 8 hours.  Glucose, capillary     Status: Abnormal   Collection Time: 11/10/23 11:38 AM  Result Value Ref Range   Glucose-Capillary 270 (H) 70 - 99 mg/dL    Comment: Glucose reference range applies only to samples taken after  fasting for at least 8 hours.   Comment 1 Notify RN   Glucose, capillary     Status: Abnormal   Collection Time: 11/10/23  5:26 PM  Result Value Ref Range   Glucose-Capillary 138 (H) 70 - 99 mg/dL    Comment: Glucose reference range applies only to samples taken after fasting for at least 8 hours.  Glucose, capillary     Status: Abnormal   Collection Time: 11/10/23  8:00 PM  Result Value Ref Range   Glucose-Capillary 208 (H) 70 - 99 mg/dL    Comment: Glucose reference range applies only to samples taken after fasting for at least 8 hours.    Blood Alcohol level:  Lab Results  Component Value Date   [facility] <15 11/06/2023   ETH <15 08/25/2023    Metabolic Disorder Labs: Lab Results   Component Value Date   HGBA1C 10.6 (H) 11/06/2023   MPG 258 11/06/2023   MPG 223.08 08/25/2023   No results found for: PROLACTIN Lab Results  Component Value Date   CHOL 279 (H) 11/06/2023   TRIG 282 (H) 11/06/2023   HDL 60 11/06/2023   CHOLHDL 4.7 11/06/2023   VLDL 56 (H) 11/06/2023   LDLCALC 163 (H) 11/06/2023   LDLCALC 57 08/25/2023    Physical Findings: AIMS:  , ,  ,  ,    CIWA:    COWS:      Psychiatric Specialty Exam:  Presentation  General Appearance:  Appropriate for Environment; Disheveled  Eye Contact: Fair  Speech: Clear and Coherent  Speech Volume: Normal    Mood and Affect  Mood: Depressed; Anxious  Affect: Congruent   Thought Process  Thought Processes: Coherent  Descriptions of Associations:Intact  Orientation:Full (Time, Place and Person)  Thought Content:Logical  Hallucinations: Denies Ideas of Reference:None  Suicidal Thoughts: Endorses SI with different plans Homicidal Thoughts: Denies  Sensorium  Memory: Immediate Fair  Judgment: Fair  Insight: Fair   Art therapist  Concentration: Fair  Attention Span: Fair  Recall: Fair  Fund of Knowledge: Fair  Language: Fair   Psychomotor Activity  Psychomotor Activity:No data recorded Musculoskeletal: Strength & Muscle Tone: within normal limits Gait & Station: normal Assets  Assets: Manufacturing systems engineer; Desire for Improvement; Financial Resources/Insurance; Intimacy; Physical Health; Resilience; Social Support    Physical Exam: Physical Exam Vitals and nursing note reviewed.    ROS Blood pressure (!) 142/83, pulse 78, temperature (!) 97.2 F (36.2 C), resp. rate 18, height 5' 8 (1.727 m), weight 108.4 kg, SpO2 99%. Body mass index is 36.34 kg/m.  Diagnosis: Principal Problem:   MDD (major depressive disorder)   Safety and Monitoring:             -- Voluntary admission to inpatient psychiatric unit for safety, stabilization and  treatment             -- Daily contact with patient to assess and evaluate symptoms and progress in treatment             -- Patient's case to be discussed in multi-disciplinary team meeting             -- Observation Level: q15 minute checks             -- Vital signs:  q12 hours             -- Precautions: suicide, elopement, and assault   2. Psychiatric Diagnoses and Treatment:  Increased Abilify  10 mg nightly and 5 mg daily Zoloft  150 mg daily   -- The risks/benefits/side-effects/alternatives to this medication were discussed in detail with the patient and time was given for questions. The patient consents to medication trial.                -- Metabolic profile and EKG monitoring obtained while on an atypical antipsychotic (BMI: Lipid Panel: HbgA1c: QTc:)              -- Encouraged patient to participate in unit milieu and in scheduled group therapies  4. Discharge Planning:   -- Social work and case management to assist with discharge planning and identification of hospital follow-up needs prior to discharge  -- Estimated LOS: 3-4 days  Allyn Foil, MD 11/10/2023, 9:38 PM

## 2023-11-10 NOTE — Plan of Care (Signed)
  Problem: Education: Goal: Emotional status will improve Outcome: Not Met (add Reason) Goal: Mental status will improve Outcome: Progressing   Problem: Education: Goal: Mental status will improve Outcome: Progressing   Problem: Activity: Goal: Interest or engagement in activities will improve Outcome: Progressing Goal: Sleeping patterns will improve Outcome: Progressing

## 2023-11-10 NOTE — Group Note (Signed)
 Date:  11/10/2023 Time:  2:45 PM  Group Topic/Focus:  Goals Group:   The focus of this group is to help patients establish daily goals to achieve during treatment and discuss how the patient can incorporate goal setting into their daily lives to aide in recovery.    Participation Level:  Active  Participation Quality:  Appropriate  Affect:  Appropriate  Cognitive:  Appropriate  Insight: Appropriate  Engagement in Group:  Engaged  Modes of Intervention:  Discussion, Education, and Support  Additional Comments:    Deitra Caron Mainland 11/10/2023, 2:45 PM

## 2023-11-10 NOTE — Group Note (Signed)
 Date:  11/10/2023 Time:  8:56 PM  Group Topic/Focus:  Personal Choices and Values:   The focus of this group is to help patients assess and explore the importance of values in their lives, how their values affect their decisions, how they express their values and what opposes their expression. Self Esteem Action Plan:   The focus of this group is to help patients create a plan to continue to build self-esteem after discharge.    Participation Level:  Active  Participation Quality:  Appropriate  Affect:  Appropriate  Cognitive:  Appropriate  Insight: Appropriate  Engagement in Group:  Engaged  Modes of Intervention:  Discussion  Additional Comments:    [name] L 11/10/2023, 8:56 PM

## 2023-11-11 LAB — GLUCOSE, CAPILLARY
Glucose-Capillary: 195 mg/dL — ABNORMAL HIGH (ref 70–99)
Glucose-Capillary: 243 mg/dL — ABNORMAL HIGH (ref 70–99)
Glucose-Capillary: 136 mg/dL — ABNORMAL HIGH (ref 70–99)
Glucose-Capillary: 187 mg/dL — ABNORMAL HIGH (ref 70–99)

## 2023-11-11 NOTE — Group Note (Signed)
 Date:  11/11/2023 Time:  10:37 AM  Group Topic/Focus:  Goals Group:   The focus of this group is to help patients establish daily goals to achieve during treatment and discuss how the patient can incorporate goal setting into their daily lives to aide in recovery.    Participation Level:  Active  Participation Quality:  Appropriate  Affect:  Appropriate  Cognitive:  Appropriate  Insight: Appropriate  Engagement in Group:  Engaged  Modes of Intervention:  Discussion, Education, and Support  Additional Comments:    Deitra Caron Mainland 11/11/2023, 10:37 AM

## 2023-11-11 NOTE — Progress Notes (Signed)
 Pt was visible in the milieu and noted interacting with selective.  During 1:1, Pt endorse anxiety rated moderately severe, depression mild and fleeting SI without plan and intent; he contracted for safety but did not process about stressors adding I have always been anxious, I don't know why.  He remains care compliant.   11/11/23 0000  Psych Admission Type (Psych Patients Only)  Admission Status Voluntary  Psychosocial Assessment  Patient Complaints Anxiety  Eye Contact Fair  Facial Expression Flat  Affect Anxious  Speech Logical/coherent  Interaction Assertive  Motor Activity Other (Comment) (WDL)  Appearance/Hygiene Unremarkable  Behavior Characteristics Appropriate to situation  Mood Anxious  Aggressive Behavior  Effect No apparent injury  Thought Process  Coherency WDL  Content WDL  Delusions None reported or observed  Perception WDL  Hallucination None reported or observed  Judgment Poor  Confusion None  Danger to Self  Current suicidal ideation? Active  Agreement Not to Harm Self Yes  Description of Agreement Verbal  Danger to Others  Danger to Others None reported or observed   Problem: Education: Goal: Knowledge of [place] [facility] Education information/materials will improve Outcome: Progressing Goal: Emotional status will improve Outcome: Progressing Goal: Mental status will improve Outcome: Progressing Goal: Verbalization of understanding the information provided will improve Outcome: Progressing   Problem: Activity: Goal: Interest or engagement in activities will improve Outcome: Progressing Goal: Sleeping patterns will improve Outcome: Progressing   Problem: Coping: Goal: Ability to verbalize frustrations and anger appropriately will improve Outcome: Progressing Goal: Ability to demonstrate self-control will improve Outcome: Progressing

## 2023-11-11 NOTE — Evaluation (Signed)
 Occupational Therapy Evaluation Patient Details Name: [name] MRN: [id number] DOB: 02/14/1961 Today's Date: 11/11/2023   History of Present Illness   Pt is a 63 yr old male who presents to the inpatient psychiatric unit for suicidal ideation and plan. PMH of DM, HTN, hypercholesterinemia, anxiety, and depression with severe features, h/o multiple hospitalizations for severe depression x5 this past year, last one 08/26/23. Pt also has history of multiple suicide attempts the last one being in 03/2023.     Clinical Impressions Pt was seen for OT evaluation this date. PTA, pt reports he resides in a boarding home where he has his own room with a shared kitchen and bathroom. There are 2 STE the home and 1 STE the kitchen. Pt reports being IND at baseline without AD use, driving and grocery shopping. Pt presents with higher level balance deficits that do not appear to be affecting his ADL performance. Pt demo STS from chair in hallway with IND, ambulation in hallway to his room with distant supervision. Bed mobility performed independently and STS from EOB. He demo LB dressing independently and shower transfer with use of grab bars at MOD I level. He has chronic L shoulder ROM deficits after a fall and shoulder surgery 3 years ago. He is R hand dominant with strength intact. Pt does not feel he has a need for OT services and no DME. Edu on a shower chair if needed for safety. OT to sign off in house with no further OT services indicated.    If plan is discharge home, recommend the following:         Functional Status Assessment   Patient has not had a recent decline in their functional status     Equipment Recommendations   None recommended by OT     Recommendations for Other Services         Precautions/Restrictions   Precautions Precautions: Fall Recall of Precautions/Restrictions: Intact Restrictions Weight Bearing Restrictions Per Provider Order: No     Mobility  Bed Mobility Overal bed mobility: Independent             General bed mobility comments: increased effort    Transfers Overall transfer level: Independent                 General transfer comment: no assist to stand from bed or chair in hallway      Balance Overall balance assessment: No apparent balance deficits (not formally assessed)                                         ADL either performed or assessed with clinical judgement   ADL Overall ADL's : Independent;Modified independent                                       General ADL Comments: no assist for LB dressing; MOD I for shower transfer using grab bar     Vision         Perception         Praxis         Pertinent Vitals/Pain Pain Assessment Pain Assessment: No/denies pain     Extremity/Trunk Assessment Upper Extremity Assessment Upper Extremity Assessment: Overall WFL for tasks assessed   Lower Extremity Assessment Lower Extremity Assessment: Overall WFL for  tasks assessed       Communication Communication Communication: No apparent difficulties   Cognition Arousal: Alert Behavior During Therapy: WFL for tasks assessed/performed                                 Following commands: Intact       Cueing  General Comments   Cueing Techniques: Verbal cues      Exercises     Shoulder Instructions      Home Living Family/patient expects to be discharged to:: Group home Living Arrangements: Alone   Type of Home: House Home Access: Stairs to enter Entrance Stairs-Number of Steps: 2 Entrance Stairs-Rails: None Home Layout: Two level;Able to live on main level with bedroom/bathroom     Bathroom Shower/Tub: Producer, television/film/video: Standard     Home Equipment: None          Prior Functioning/Environment Prior Level of Function : Independent/Modified Independent             Mobility Comments: Ind amb  community distances without an AD, three falls in the last 6 months secondary to LOB ADLs Comments: Ind with ADLs; drives for door dash, walks through grocery store    OT Problem List:     OT Treatment/Interventions:        OT Goals(Current goals can be found in the care plan section)       OT Frequency:       Co-evaluation              AM-PAC OT 6 Clicks Daily Activity     Outcome Measure Help from another person eating meals?: None Help from another person taking care of personal grooming?: None Help from another person toileting, which includes using toliet, bedpan, or urinal?: None Help from another person bathing (including washing, rinsing, drying)?: None Help from another person to put on and taking off regular upper body clothing?: None Help from another person to put on and taking off regular lower body clothing?: None 6 Click Score: 24   End of Session    Activity Tolerance: Patient tolerated treatment well Patient left:  (in bathroom)  OT Visit Diagnosis: Other abnormalities of gait and mobility (R26.89)                Time: 9188-9161 OT Time Calculation (min): 27 min Charges:  OT General Charges $OT Visit: 1 Visit OT Evaluation $OT Eval Low Complexity: 1 Low OT Treatments $Self Care/Home Management : 8-22 mins Kishana Battey, OTR/L 11/11/23, 1:13 PM  Ambert Virrueta E [place] Delauder 11/11/2023, 1:07 PM

## 2023-11-11 NOTE — Inpatient Diabetes Management (Signed)
 Inpatient Diabetes Program Recommendations  AACE/ADA: New Consensus Statement on Inpatient Glycemic Control (2015)  Target Ranges:  Prepandial:   less than 140 mg/dL      Peak postprandial:   less than 180 mg/dL (1-2 hours)      Critically ill patients:  140 - 180 mg/dL    Latest Reference Range & Units 11/10/23 06:19 11/10/23 11:38 11/10/23 17:26 11/10/23 20:00  Glucose-Capillary 70 - 99 mg/dL 842 (H) 729 (H) 861 (H) 208 (H)  (H): Data is abnormally high  Latest Reference Range & Units 11/11/23 07:27  Glucose-Capillary 70 - 99 mg/dL 804 (H)  (H): Data is abnormally high   Home DM Meds: Glipizide  5 mg daily                              Metformin  1000 mg BID   Current Orders: Glipizide  5 mg daily                            Metformin  1000 mg BID                            Novolog  Moderate Correction Scale/ SSI (0-15 units) TID AC + HS       MD- Please consider increasing the Glipizide  to 5 mg BID   Looks like pt has used 70/30 Insulin  BID in the past per PCP notes from 2024    --Will follow patient during hospitalization--  Adina Rudolpho Arrow RN, MSN, CDCES Diabetes Coordinator Inpatient Glycemic Control Team Team Pager: [phone] (8a-5p)

## 2023-11-11 NOTE — Progress Notes (Signed)
   11/11/23 0942  Psych Admission Type (Psych Patients Only)  Admission Status Voluntary  Psychosocial Assessment  Patient Complaints Anxiety;Depression  Eye Contact Fair  Facial Expression Flat  Affect Appropriate to circumstance  Speech Logical/coherent  Interaction Assertive  Motor Activity Slow  Appearance/Hygiene Unremarkable  Behavior Characteristics Cooperative  Mood Pleasant  Aggressive Behavior  Effect No apparent injury  Thought Process  Coherency WDL  Content WDL  Delusions None reported or observed  Perception WDL  Hallucination None reported or observed  Judgment Poor  Confusion None  Danger to Self  Current suicidal ideation? Active (Denied SI for the writer, but endorsed SI with plan to OD on fentanyl  to OT this morning)  Description of Suicide Plan OD on fentanyl   Self-Injurious Behavior No self-injurious ideation or behavior indicators observed or expressed   Agreement Not to Harm Self Yes  Description of Agreement verbal  Danger to Others  Danger to Others None reported or observed

## 2023-11-11 NOTE — Group Note (Signed)
 Recreation Therapy Group Note   Group Topic:Leisure Education  Group Date: 11/11/2023 Start Time: 1530 End Time: 1635 Facilitators: Celestia Jeoffrey FORBES ARTICE, CTRS Location: Craft Room  Group Description: Leisure. Patients were given the option to choose from singing karaoke, coloring mandalas, using oil pastels, journaling, painting or playing with play-doh. LRT and pts discussed the meaning of leisure, the importance of participating in leisure during their free time/when they're outside of the hospital, as well as how our leisure interests can also serve as coping skills.   Goal Area(s) Addressed:  Patient will identify a current leisure interest.  Patient will learn the definition of "leisure". Patient will practice making a positive decision. Patient will have the opportunity to try a new leisure activity. Patient will communicate with peers and LRT.    Affect/Mood: Appropriate   Participation Level: Active and Engaged   Participation Quality: Independent   Behavior: Appropriate, Calm, and Cooperative   Speech/Thought Process: Coherent   Insight: Good   Judgement: Moderate   Modes of Intervention: Education, Exploration, and Music   Patient Response to Interventions:  Attentive, Engaged, Interested , and Receptive   Education Outcome:  Acknowledges education   Clinical Observations/Individualized Feedback: [name] was active in their participation of session activities and group discussion. Pt identified writing and cooking as things he does in his free time. Pt chose to sing karaoke while in group.    Plan: Continue to engage patient in RT group sessions 2-3x/week.   Jeoffrey FORBES Celestia, LRT, CTRS 11/11/2023 5:16 PM

## 2023-11-11 NOTE — Group Note (Signed)
 Recreation Therapy Group Note   Group Topic:Health and Wellness  Group Date: 11/11/2023 Start Time: 1020 End Time: 1120 Facilitators: Celestia Jeoffrey BRAVO, LRT, CTRS Location: Courtyard  Group Description: [employer]. LRT and patients played games of basketball, drew with chalk, and played corn hole while outside in the courtyard while getting fresh air and sunlight. Music was being played in the background. LRT and peers conversed about different games they have played before, what they do in their free time and anything else that is on their minds. LRT encouraged pts to drink water after being outside, sweating and getting their heart rate up.  Goal Area(s) Addressed: Patient will build on frustration tolerance skills. Patients will partake in a competitive play game with peers. Patients will gain knowledge of new leisure interest/hobby.    Affect/Mood: Appropriate   Participation Level: Active   Participation Quality: Independent   Behavior: Appropriate   Speech/Thought Process: Coherent   Insight: Good   Judgement: Good   Modes of Intervention: Activity   Patient Response to Interventions:  Receptive   Education Outcome:  Acknowledges education   Clinical Observations/Individualized Feedback: [name] was active in their participation of session activities and group discussion. Pt interacted well with LRT and peers duration of session.    Plan: Continue to engage patient in RT group sessions 2-3x/week.   Jeoffrey BRAVO Celestia, LRT, CTRS 11/11/2023 11:34 AM

## 2023-11-11 NOTE — Group Note (Signed)
 Date:  11/11/2023 Time:  11:24 PM  Group Topic/Focus:  Goals Group:   The focus of this group is to help patients establish daily goals to achieve during treatment and discuss how the patient can incorporate goal setting into their daily lives to aide in recovery. Personal Choices and Values:   The focus of this group is to help patients assess and explore the importance of values in their lives, how their values affect their decisions, how they express their values and what opposes their expression. Self Esteem Action Plan:   The focus of this group is to help patients create a plan to continue to build self-esteem after discharge.    Participation Level:  Active  Participation Quality:  Appropriate and Attentive  Affect:  Appropriate  Cognitive:  Alert, Appropriate, and Oriented  Insight: Appropriate and Good  Engagement in Group:  Engaged  Modes of Intervention:  Discussion and Support  Additional Comments:  N/A  Butler LITTIE Gelineau 11/11/2023, 11:24 PM

## 2023-11-11 NOTE — [facility] Counselor (Signed)
 ACTT Referral sent to [facility].   Airrion Otting, MSW, LCSWA 11/11/2023 1:38 PM

## 2023-11-11 NOTE — Evaluation (Signed)
 Physical Therapy Evaluation Patient Details Name: [name] MRN: [id number] DOB: 11/24/1960 Today's Date: 11/11/2023  History of Present Illness  Pt is a 63 yr old male who presents to the inpatient psychiatric unit for suicidal ideation and plan. PMH of DM, HTN, hypercholesterinemia, anxiety, and depression with severe features. Patient has a history of multiple hospitalizations for severe depression x5 this past year, last one 08/26/23. Patient also has history of multiple suicide attempts the last one being in 03/2023.   Clinical Impression  Pt was pleasant and motivated to participate during the session and put forth good effort throughout. Pt ambulated 150+ feet without an AD with noted wide BOS and min decreased cadence but was generally steady with no overt LOB including during start/stops and 180 deg turns.  Pt scored 45/56 on his initial Berg Balance Test and reported a history of of three falls in the last 6 months secondary to general LOB.  Pt declined use of AD at this time with education and will benefit from continued PT services upon discharge to safely address deficits listed in patient problem list for decreased caregiver assistance and eventual return to PLOF.          If plan is discharge home, recommend the following: A little help with walking and/or transfers;Assist for transportation;Help with stairs or ramp for entrance   Can travel by private vehicle        Equipment Recommendations Other (comment) (Pt declined AD)  Recommendations for Other Services       Functional Status Assessment Patient has had a recent decline in their functional status and demonstrates the ability to make significant improvements in function in a reasonable and predictable amount of time.     Precautions / Restrictions Precautions Precautions: Fall Restrictions Weight Bearing Restrictions Per Provider Order: No      Mobility  Bed Mobility Overal bed mobility: Independent                   Transfers Overall transfer level: Independent                 General transfer comment: Good eccentric and concentric control and stability    Ambulation/Gait Ambulation/Gait assistance: Supervision Gait Distance (Feet): 150 Feet Assistive device: None Gait Pattern/deviations: Step-through pattern, Decreased step length - right, Decreased step length - left, Wide base of support Gait velocity: decreased     General Gait Details: Min reduced cadence with wide BOS but generally steady with no overt LOB  Stairs            Wheelchair Mobility     Tilt Bed    Modified Rankin (Stroke Patients Only)       Balance                                 Standardized Balance Assessment Standardized Balance Assessment : Berg Balance Test Berg Balance Test Sit to Stand: Able to stand without using hands and stabilize independently Standing Unsupported: Able to stand safely 2 minutes Sitting with Back Unsupported but Feet Supported on Floor or Stool: Able to sit safely and securely 2 minutes Stand to Sit: Sits safely with minimal use of hands Transfers: Able to transfer safely, minor use of hands Standing Unsupported with Eyes Closed: Able to stand 10 seconds safely Standing Ubsupported with Feet Together: Able to place feet together independently and stand for 1 minute with supervision From Standing,  Reach Forward with Outstretched Arm: Can reach forward >12 cm safely (5) From Standing Position, Pick up Object from Floor: Able to pick up shoe, needs supervision From Standing Position, Turn to Look Behind Over each Shoulder: Looks behind from both sides and weight shifts well Turn 360 Degrees: Able to turn 360 degrees safely but slowly Standing Unsupported, Alternately Place Feet on Step/Stool: Able to stand independently and complete 8 steps >20 seconds Standing Unsupported, One Foot in Front: Loses balance while stepping or  standing Standing on One Leg: Able to lift leg independently and hold 5-10 seconds Total Score: 45         Pertinent Vitals/Pain Pain Assessment Pain Assessment: No/denies pain    Home Living Family/patient expects to be discharged to:: Group home Living Arrangements: Alone   Type of Home: House Home Access: Stairs to enter Entrance Stairs-Rails: None Entrance Stairs-Number of Steps: 2   Home Layout: Two level;Able to live on main level with bedroom/bathroom Home Equipment: None      Prior Function Prior Level of Function : Independent/Modified Independent             Mobility Comments: Ind amb community distances without an AD, three falls in the last 6 months secondary to LOB ADLs Comments: Ind with ADLs     Extremity/Trunk Assessment   Upper Extremity Assessment Upper Extremity Assessment: Overall WFL for tasks assessed    Lower Extremity Assessment Lower Extremity Assessment: Overall WFL for tasks assessed       Communication   Communication Communication: No apparent difficulties    Cognition Arousal: Alert Behavior During Therapy: WFL for tasks assessed/performed   PT - Cognitive impairments: No apparent impairments                         Following commands: Intact       Cueing Cueing Techniques: Verbal cues     General Comments      Exercises     Assessment/Plan    PT Assessment Patient needs continued PT services  PT Problem List Decreased balance       PT Treatment Interventions DME instruction;Gait training;Stair training;Functional mobility training;Therapeutic activities;Therapeutic exercise;Balance training;Patient/family education    PT Goals (Current goals can be found in the Care Plan section)  Acute Rehab PT Goals Patient Stated Goal: Improved balance PT Goal Formulation: With patient Time For Goal Achievement: 11/24/23 Potential to Achieve Goals: Good    Frequency Min 2X/week     Co-evaluation                AM-PAC PT 6 Clicks Mobility  Outcome Measure Help needed turning from your back to your side while in a flat bed without using bedrails?: None Help needed moving from lying on your back to sitting on the side of a flat bed without using bedrails?: None Help needed moving to and from a bed to a chair (including a wheelchair)?: None Help needed standing up from a chair using your arms (e.g., wheelchair or bedside chair)?: None Help needed to walk in hospital room?: A Little Help needed climbing 3-5 steps with a railing? : A Little 6 Click Score: 22    End of Session Equipment Utilized During Treatment: Gait belt Activity Tolerance: Patient tolerated treatment well Patient left: in bed Nurse Communication: Mobility status PT Visit Diagnosis: Unsteadiness on feet (R26.81);History of falling (Z91.81)    Time: 9085-9060 PT Time Calculation (min) (ACUTE ONLY): 25 min   Charges:  PT Evaluation $PT Eval Moderate Complexity: 1 Mod PT Treatments $Therapeutic Activity: 8-22 mins PT General Charges $$ ACUTE PT VISIT: 1 Visit    D. Scott Ivana Nicastro PT, DPT 11/11/23, 12:02 PM

## 2023-11-11 NOTE — [facility] Counselor (Signed)
 [facility] declined patient for ACTT services.   CSW attempted to contact psychotherapeutic ACTT services to assess and did not get an answer.   CSW left HIPAA Compliant voicemail.   CSW to continue to assess.   Wayde Gopaul, MSW, LCSWA 11/11/2023 1:29 PM

## 2023-11-11 NOTE — [facility] Suicide Risk Assessment (Signed)
 [facility] INPATIENT:  Family/Significant Other Suicide Prevention Education  Suicide Prevention Education:  Contact Attempts: [name], [phone], Mother, has been identified by the patient as the family member/significant other with whom the patient will be residing, and identified as the person(s) who will aid the patient in the event of a mental health crisis.  With written consent from the patient, two attempts were made to provide suicide prevention education, prior to and/or following the patient's discharge.  We were unsuccessful in providing suicide prevention education.  A suicide education pamphlet was given to the patient to share with family/significant other.  Date and time of first attempt:11/11/23 at 9:54 AM  Date and time of second attempt: Second attempt is needed.   Alveta CHRISTELLA Kerns 11/11/2023, 12:07 PM

## 2023-11-12 LAB — GLUCOSE, CAPILLARY
Glucose-Capillary: 194 mg/dL — ABNORMAL HIGH (ref 70–99)
Glucose-Capillary: 207 mg/dL — ABNORMAL HIGH (ref 70–99)
Glucose-Capillary: 192 mg/dL — ABNORMAL HIGH (ref 70–99)
Glucose-Capillary: 170 mg/dL — ABNORMAL HIGH (ref 70–99)

## 2023-11-12 NOTE — Group Note (Signed)
 Date:  11/12/2023 Time:  8:55 PM  Group Topic/Focus:  Managing Feelings:   The focus of this group is to identify what feelings patients have difficulty handling and develop a plan to handle them in a healthier way upon discharge.    Participation Level:  Active  Participation Quality:  Appropriate, Attentive, and Sharing  Affect:  Appropriate  Cognitive:  Alert and Appropriate  Insight: Appropriate, Good, and Improving  Engagement in Group:  Developing/Improving and Engaged  Modes of Intervention:  Clarification, Discussion, Education, Rapport Building, and Support  Additional Comments:     [name] 11/12/2023, 8:55 PM

## 2023-11-12 NOTE — BH IP Treatment Plan (Signed)
 Interdisciplinary Treatment and Diagnostic Plan Update  11/12/2023 Time of Session: 9:00 AM GEAROLD WAINER MRN: [id number]  Principal Diagnosis: MDD (major depressive disorder)  Secondary Diagnoses: Principal Problem:   MDD (major depressive disorder)   Current Medications:  Current Facility-Administered Medications  Medication Dose Route Frequency Provider Last Rate Last Admin   acetaminophen  (TYLENOL ) tablet 650 mg  650 mg Oral Q6H PRN Ajibola, Ene A, NP   650 mg at 11/12/23 0413   alum & mag hydroxide-simeth (MAALOX/MYLANTA) 200-200-20 MG/5ML suspension 30 mL  30 mL Oral Q4H PRN Ajibola, Ene A, NP       ARIPiprazole  (ABILIFY ) tablet 10 mg  10 mg Oral QHS Jadapalle, Sree, MD   10 mg at 11/11/23 2103   ARIPiprazole  (ABILIFY ) tablet 5 mg  5 mg Oral Daily Jadapalle, Sree, MD   5 mg at 11/12/23 0840   atorvastatin  (LIPITOR) tablet 40 mg  40 mg Oral Daily Ajibola, Ene A, NP   40 mg at 11/12/23 0840   glipiZIDE  (GLUCOTROL  XL) 24 hr tablet 5 mg  5 mg Oral Daily Ajibola, Ene A, NP   5 mg at 11/12/23 0840   hydrOXYzine  (ATARAX ) tablet 25 mg  25 mg Oral TID PRN Ajibola, Ene A, NP   25 mg at 11/08/23 2056   insulin  aspart (novoLOG ) injection 0-15 Units  0-15 Units Subcutaneous TID WC Ajibola, Ene A, NP   5 Units at 11/12/23 1254   insulin  aspart (novoLOG ) injection 0-5 Units  0-5 Units Subcutaneous QHS Ajibola, Ene A, NP   2 Units at 11/10/23 2121   lisinopril  (ZESTRIL ) tablet 40 mg  40 mg Oral Daily Ajibola, Ene A, NP   40 mg at 11/12/23 0841   magnesium  hydroxide (MILK OF MAGNESIA) suspension 30 mL  30 mL Oral Daily PRN Ajibola, Ene A, NP       metFORMIN  (GLUCOPHAGE ) tablet 1,000 mg  1,000 mg Oral BID WC Ajibola, Ene A, NP   1,000 mg at 11/12/23 0840   pantoprazole  (PROTONIX ) EC tablet 40 mg  40 mg Oral Daily Ajibola, Ene A, NP   40 mg at 11/12/23 0840   sertraline  (ZOLOFT ) tablet 150 mg  150 mg Oral Daily Ajibola, Ene A, NP   150 mg at 11/12/23 0843   traZODone  (DESYREL ) tablet 50 mg  50 mg  Oral QHS PRN Ajibola, Ene A, NP   50 mg at 11/11/23 2103   PTA Medications: Medications Prior to Admission  Medication Sig Dispense Refill Last Dose/Taking   ARIPiprazole  (ABILIFY ) 10 MG tablet Take 1 tablet (10 mg total) by mouth daily. 30 tablet 0    atorvastatin  (LIPITOR) 40 MG tablet Take 1 tablet (40 mg total) by mouth daily. 90 tablet 3    glipiZIDE  (GLUCOTROL  XL) 5 MG 24 hr tablet Take 1 tablet (5 mg total) by mouth daily. 30 tablet 0    lisinopril  (ZESTRIL ) 40 MG tablet Take 1 tablet (40 mg total) by mouth daily. 30 tablet 0    metFORMIN  (GLUCOPHAGE ) 1000 MG tablet Take 1 tablet (1,000 mg total) by mouth 2 (two) times daily with a meal. 60 tablet 0    pantoprazole  (PROTONIX ) 40 MG tablet Take 1 tablet (40 mg total) by mouth daily. 30 tablet 0    sertraline  (ZOLOFT ) 50 MG tablet Take 3 tablets (150 mg total) by mouth daily. 90 tablet 0    traZODone  (DESYREL ) 50 MG tablet Take 1 tablet (50 mg total) by mouth at bedtime as needed for sleep. 30 tablet  0     Patient Stressors:    Patient Strengths:    Treatment Modalities: Medication Management, Group therapy, Case management,  1 to 1 session with clinician, Psychoeducation, Recreational therapy.   Physician Treatment Plan for Primary Diagnosis: MDD (major depressive disorder) Long Term Goal(s): Improvement in symptoms so as ready for discharge   Short Term Goals: Ability to identify changes in lifestyle to reduce recurrence of condition will improve Ability to verbalize feelings will improve Ability to disclose and discuss suicidal ideas Ability to demonstrate self-control will improve Ability to identify and develop effective coping behaviors will improve  Medication Management: Evaluate patient's response, side effects, and tolerance of medication regimen.  Therapeutic Interventions: 1 to 1 sessions, Unit Group sessions and Medication administration.  Evaluation of Outcomes: Progressing  Physician Treatment Plan for  Secondary Diagnosis: Principal Problem:   MDD (major depressive disorder)  Long Term Goal(s): Improvement in symptoms so as ready for discharge   Short Term Goals: Ability to identify changes in lifestyle to reduce recurrence of condition will improve Ability to verbalize feelings will improve Ability to disclose and discuss suicidal ideas Ability to demonstrate self-control will improve Ability to identify and develop effective coping behaviors will improve     Medication Management: Evaluate patient's response, side effects, and tolerance of medication regimen.  Therapeutic Interventions: 1 to 1 sessions, Unit Group sessions and Medication administration.  Evaluation of Outcomes: Progressing   RN Treatment Plan for Primary Diagnosis: MDD (major depressive disorder) Long Term Goal(s): Knowledge of disease and therapeutic regimen to maintain health will improve  Short Term Goals: Ability to demonstrate self-control, Ability to participate in decision making will improve, Ability to verbalize feelings will improve, Ability to disclose and discuss suicidal ideas, Ability to identify and develop effective coping behaviors will improve, and Compliance with prescribed medications will improve   Medication Management: RN will administer medications as ordered by provider, will assess and evaluate patient's response and provide education to patient for prescribed medication. RN will report any adverse and/or side effects to prescribing provider.  Therapeutic Interventions: 1 on 1 counseling sessions, Psychoeducation, Medication administration, Evaluate responses to treatment, Monitor vital signs and CBGs as ordered, Perform/monitor CIWA, COWS, AIMS and Fall Risk screenings as ordered, Perform wound care treatments as ordered.  Evaluation of Outcomes: Progressing   LCSW Treatment Plan for Primary Diagnosis: MDD (major depressive disorder) Long Term Goal(s): Safe transition to appropriate next  level of care at discharge, Engage patient in therapeutic group addressing interpersonal concerns.  Short Term Goals: Engage patient in aftercare planning with referrals and resources, Increase social support, Increase ability to appropriately verbalize feelings, Increase emotional regulation, Facilitate acceptance of mental health diagnosis and concerns, and Increase skills for wellness and recovery   Therapeutic Interventions: Assess for all discharge needs, 1 to 1 time with Social worker, Explore available resources and support systems, Assess for adequacy in community support network, Educate family and significant other(s) on suicide prevention, Complete Psychosocial Assessment, Interpersonal group therapy.  Evaluation of Outcomes: Progressing   Progress in Treatment: Attending groups: Yes. 11/12/23 Update: Yes.  Participating in groups: Yes. 11/12/23 Update: Yes.  Taking medication as prescribed: Yes. 11/12/23 Update: Yes.  Toleration medication: Yes. 11/12/23 Update: Yes.  Family/Significant other contact made: No, will contact:  once permission has been granted08/16/25 Update: Contact Attempts: [name], [phone], Mother, has been identified by the patient as the family member/significant other with whom the patient will be residing, and identified as the person(s) who will aid the  patient in the event of a mental health crisis.  With written consent from the patient, two attempts were made to provide suicide prevention education, prior to and/or following the patient's discharge.  We were unsuccessful in providing suicide prevention education.  A suicide education pamphlet was given to the patient to share with family/significant other.  Patient understands diagnosis: Yes. 11/12/23 Update: Yes.  Discussing patient identified problems/goals with staff: Yes. 11/12/23 Update: Yes.  Medical problems stabilized or resolved: Yes. 11/12/23 Update: Yes.  Denies suicidal/homicidal ideation:  Yes. 11/12/23 Update: Yes.  Issues/concerns per patient self-inventory: No. 11/12/23 Update: No.  Other: none 11/12/23 Update: No.   New problem(s) identified: No, Describe:  none 11/12/23 Update: None.   New Short Term/Long Term Goal(s): detox, elimination of symptoms of psychosis, medication management for mood stabilization; elimination of SI thoughts; development of comprehensive mental wellness/sobriety plan. 11/12/23 Update: Goal to remain the same.    Patient Goals:  to get my meds stabilized 11/12/23 Update: Goal to remain the same.   Discharge Plan or Barriers: CSW to assist in the development of appropriate discharge plans.  11/12/23 Update:  CSW has made ACTT referrals on patient's behalf. CSW to continue to work on outpatient supports for patient.    Reason for Continuation of Hospitalization: Anxiety Depression Medication stabilization Suicidal ideation  Estimated Length of Stay: 1-7 days 11/12/23 Update: TBD   Last 3 [place] Suicide Severity Risk Score: Flowsheet Row Admission (Current) from 11[facility]06/2023 in [facility] Most recent reading at 11/06/2023 10:00 PM ED from 11/06/2023 in [facility] Most recent reading at 11/06/2023 11:48 AM [facility] Most recent reading at 08/26/2023  4:01 PM  C-SSRS RISK CATEGORY High Risk High Risk High Risk    Last PHQ 2/9 Scores:    03/10/2023   10:23 AM 02/22/2023    3:28 PM 05/04/2021    1:15 PM  Depression screen PHQ 2/9  Decreased Interest 0 2 0  Down, Depressed, Hopeless 2 2 1   PHQ - 2 Score 2 4 1   Altered sleeping 3 1 0  Tired, decreased energy 1 2 0  Change in appetite 1 1 0  Feeling bad or failure about yourself  3 2 1   Trouble concentrating 1 1 0  Moving slowly or fidgety/restless 0 1 0  Suicidal thoughts 2 2 0  PHQ-9 Score 13 14 2   Difficult doing work/chores Somewhat difficult Somewhat difficult  Not difficult at all    Scribe for Treatment Team: Alveta CHRISTELLA Kerns, LCSW 11/12/2023 3:01 PM

## 2023-11-12 NOTE — Progress Notes (Signed)
   11/11/23 2000  Psych Admission Type (Psych Patients Only)  Admission Status Voluntary  Psychosocial Assessment  Patient Complaints Anxiety  Eye Contact Fair  Facial Expression Flat  Affect Appropriate to circumstance  Speech Logical/coherent  Interaction Assertive  Motor Activity Slow  Appearance/Hygiene Improved  Behavior Characteristics Cooperative;Appropriate to situation  Mood Pleasant  Aggressive Behavior  Effect No apparent injury  Thought Process  Coherency WDL  Content WDL  Delusions None reported or observed  Perception WDL  Hallucination None reported or observed  Judgment Poor  Confusion None  Danger to Self  Current suicidal ideation? Denies   Patient alert and oriented x 4, he denies SI/HI/AVH interacting appropriately with peers and staff. Patient's thoughts are organized and coherent, affect is congruent with mood, compliant with medication regimen, 15 minutes safety checks maintained.

## 2023-11-12 NOTE — Progress Notes (Signed)
 [facility] MD Progress Note  11/12/2023 4:31 PM [name]  MRN:  [id number]  -year-old [name] presents to [facility] via [facility] for suicidal ideations with a plan to overdose on fentanyl  he can buy off the street. Reported negative intrusive thoughts started yesterday (11/05/2023) and worsen overnight with progressive suicidal thought Patient is admitted to adult psych unit with Q15 min safety monitoring. Multidisciplinary team approach is offered. Medication management; group/milieu therapy is offered.    Subjective:  Chart reviewed, case discussed in multidisciplinary meeting, patient seen during rounds.  Patient is noted to be resting in bed.  He offers no complaints.  He reports that in the afternoon his depression and suicidal ideation gets worse.  He denies having any urges of harming himself today and is aware to come outside of his room and talk to your nurse if the urges continue.  He denies any homicidal ideation today he denies auditory/visual hallucinations.  Per nursing report patient is displaying safe behaviors on the unit.  We did discuss his Abilify  being optimized to 5 mg in the morning and 10 mg at night.  Patient denies any side effects to the medications.  11/12/2023:  Chart reviewed, case discussed in multidisciplinary meeting, patient seen during rounds. Patient is assessed on the inpatient unit and reports that he is doing very well today. He affect is bright and reports that he is even mor optimistic regarding his recovery. Denies suicidal thoughts or negative thinking today. Has been compliant with medications and treatment planning. Patient reports that he is sleeping and eating well. Denies side effects of medications. Denies SI, HI, AVH.    Sleep: Good  Appetite:  Good  Past Psychiatric History: see h&P Family History:  Family History  Problem Relation Age of Onset   Arthritis Mother    Stroke Father 84       heavy smoker 4-5 packs a day unfiltered   Schizophrenia  Father        homeless and did not get medical care in time   Alcoholism Father    Colon cancer Neg Hx    Esophageal cancer Neg Hx    Rectal cancer Neg Hx    Stomach cancer Neg Hx    Colon polyps Neg Hx    Social History:  Social History   Substance and Sexual Activity  Alcohol Use Yes   Alcohol/week: 5.0 standard drinks of alcohol   Types: 2 Glasses of wine, 3 Cans of beer per week   Comment: SOCIAL 3 TIMES PER WEEK 2 DRINKS AT A TIME     Social History   Substance and Sexual Activity  Drug Use Not Currently   Types: Marijuana   Comment: HX OF MONTHS AGO    Social History   Socioeconomic History   Marital status: Single    Spouse name: Not on file   Number of children: Not on file   Years of education: Not on file   Highest education level: Bachelor's degree (e.g., BA, AB, BS)  Occupational History   Not on file  Tobacco Use   Smoking status: Former    Current packs/day: 0.00    Average packs/day: 0.5 packs/day for 5.0 years (2.5 ttl pk-yrs)    Types: Cigarettes    Start date: 05/20/1983    Quit date: 05/19/1988    Years since quitting: 35.5   Smokeless tobacco: Never   Tobacco comments:    quit in 1990  Vaping Use   Vaping status: Never Used  Substance and Sexual Activity   Alcohol use: Yes    Alcohol/week: 5.0 standard drinks of alcohol    Types: 2 Glasses of wine, 3 Cans of beer per week    Comment: SOCIAL 3 TIMES PER WEEK 2 DRINKS AT A TIME   Drug use: Not Currently    Types: Marijuana    Comment: HX OF MONTHS AGO   Sexual activity: Never  Other Topics Concern   Not on file  Social History Narrative   Single. 1 dog, 2 cats. Lives alone.       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 Enjoys dinner parties.     Social Drivers of Health   Financial Resource Strain: High Risk (08/09/2023)   Overall Financial Resource Strain (CARDIA)    Difficulty of Paying Living Expenses: Hard  Food Insecurity: Food Insecurity Present (11/07/2023)   Hunger Vital Sign    Worried About Running Out of Food in the Last Year: Sometimes true    Ran Out of Food in the Last Year: Sometimes true  Transportation Needs: No Transportation Needs (11/07/2023)   PRAPARE - Administrator, Civil Service (Medical): No    Lack of Transportation (Non-Medical): No  Recent Concern: Transportation Needs - Unmet Transportation Needs (08/09/2023)   PRAPARE - Transportation    Lack of Transportation (Medical): Yes    Lack of Transportation (Non-Medical): Yes  Physical Activity: Sufficiently Active (08/09/2023)   Exercise Vital Sign    Days of Exercise per Week: 7 days    Minutes of Exercise per Session: 30 min  Stress: Stress Concern Present (08/09/2023)   [employer] of Occupational Health - Occupational Stress Questionnaire    Feeling of Stress : To some extent  Social Connections: Moderately Integrated (08/09/2023)   Social Connection and Isolation Panel    Frequency of Communication with Friends and Family: More than three times a week    Frequency of Social Gatherings with Friends and Family: Once a week    Attends Religious Services: More than 4 times per year    Active Member of Clubs or Organizations: Yes    Attends Engineer, structural: More than 4 times per year    Marital Status: Never married   Past Medical History:  Past Medical History:  Diagnosis Date   Cancer (HCC)    melanoma;  right ear   Depression    Diabetes mellitus    GERD (gastroesophageal reflux disease)    Hyperlipidemia    Hypertension    Metabolic syndrome    Obesity    PONV (postoperative nausea and vomiting)    YRS AGO WITHER ETHER    Past Surgical History:  Procedure Laterality Date   COLONOSCOPY  05/01/2013    CYST REMOVAL TRUNK  1989   benign   MELANOMA EXCISION  2008   right ear and neck   ORIF HUMERUS FRACTURE Left 12/23/2020   Procedure: OPEN REDUCTION INTERNAL FIXATION (ORIF) PROXIMAL HUMERUS FRACTURE;  Surgeon: [name], MD;  Location: [facility];  Service: Orthopedics;  Laterality: Left;    POLYPECTOMY     reconstructive plastic surgery face  1967   dog bite at age 47; about 15 surgeries for 10 years   TONSILLECTOMY  1966   age 66    Current Medications: Current Facility-Administered Medications  Medication Dose Route Frequency Provider Last Rate Last Admin   acetaminophen  (TYLENOL ) tablet 650 mg  650 mg Oral Q6H PRN Ajibola, Ene A, NP   650 mg at 11/12/23 0413   alum & mag hydroxide-simeth (MAALOX/MYLANTA) 200-200-20 MG/5ML suspension 30 mL  30 mL Oral Q4H PRN Ajibola, Ene A, NP       ARIPiprazole  (ABILIFY ) tablet 10 mg  10 mg Oral QHS Jadapalle, Sree, MD   10 mg at 11/11/23 2103   ARIPiprazole  (ABILIFY ) tablet 5 mg  5 mg Oral Daily Jadapalle, Sree, MD   5 mg at 11/12/23 0840   atorvastatin  (LIPITOR) tablet 40 mg  40 mg Oral Daily Ajibola, Ene A, NP   40 mg at 11/12/23 0840   glipiZIDE  (GLUCOTROL  XL) 24 hr tablet 5 mg  5 mg Oral Daily Ajibola, Ene A, NP   5 mg at 11/12/23 0840   hydrOXYzine  (ATARAX ) tablet 25 mg  25 mg Oral TID PRN Ajibola, Ene A, NP   25 mg at 11/08/23 2056   insulin  aspart (novoLOG ) injection 0-15 Units  0-15 Units Subcutaneous TID WC Ajibola, Ene A, NP   5 Units at 11/12/23 1254   insulin  aspart (novoLOG ) injection 0-5 Units  0-5 Units Subcutaneous QHS Ajibola, Ene A, NP   2 Units at 11/10/23 2121   lisinopril  (ZESTRIL ) tablet 40 mg  40 mg Oral Daily Ajibola, Ene A, NP   40 mg at 11/12/23 0841   magnesium  hydroxide (MILK OF MAGNESIA) suspension 30 mL  30 mL Oral Daily PRN Ajibola, Ene A, NP       metFORMIN  (GLUCOPHAGE ) tablet 1,000 mg  1,000 mg Oral BID WC Ajibola, Ene A, NP   1,000 mg at 11/12/23 0840   pantoprazole  (PROTONIX ) EC tablet 40 mg  40 mg Oral Daily  Ajibola, Ene A, NP   40 mg at 11/12/23 0840   sertraline  (ZOLOFT ) tablet 150 mg  150 mg Oral Daily Ajibola, Ene A, NP   150 mg at 11/12/23 0843   traZODone  (DESYREL ) tablet 50 mg  50 mg Oral QHS PRN Ajibola, Ene A, NP   50 mg at 11/11/23 2103    Lab Results:  Results for orders placed or performed during the hospital encounter of 11/06/23 (from the past 48 hours)  Glucose, capillary     Status: Abnormal   Collection Time: 11/10/23  5:26 PM  Result Value Ref Range   Glucose-Capillary 138 (H) 70 - 99 mg/dL    Comment: Glucose reference range applies only to samples taken after fasting for at least 8 hours.  Glucose, capillary     Status: Abnormal   Collection Time: 11/10/23  8:00 PM  Result Value Ref Range   Glucose-Capillary 208 (H) 70 - 99 mg/dL    Comment: Glucose reference range applies only to samples taken after fasting for at least 8 hours.  Glucose, capillary     Status: Abnormal   Collection Time: 11/11/23  7:27 AM  Result Value Ref Range   Glucose-Capillary 195 (H) 70 - 99 mg/dL    Comment: Glucose reference range applies only to samples taken after fasting for at least 8 hours.   Comment 1 Notify RN   Glucose, capillary     Status: Abnormal   Collection Time: 11/11/23 11:46 AM  Result Value Ref Range   Glucose-Capillary 243 (H) 70 - 99 mg/dL    Comment: Glucose reference range applies  only to samples taken after fasting for at least 8 hours.  Glucose, capillary     Status: Abnormal   Collection Time: 11/11/23  4:48 PM  Result Value Ref Range   Glucose-Capillary 136 (H) 70 - 99 mg/dL    Comment: Glucose reference range applies only to samples taken after fasting for at least 8 hours.  Glucose, capillary     Status: Abnormal   Collection Time: 11/11/23  8:11 PM  Result Value Ref Range   Glucose-Capillary 187 (H) 70 - 99 mg/dL    Comment: Glucose reference range applies only to samples taken after fasting for at least 8 hours.  Glucose, capillary     Status: Abnormal    Collection Time: 11/12/23  7:52 AM  Result Value Ref Range   Glucose-Capillary 194 (H) 70 - 99 mg/dL    Comment: Glucose reference range applies only to samples taken after fasting for at least 8 hours.   Comment 1 Notify RN   Glucose, capillary     Status: Abnormal   Collection Time: 11/12/23 12:21 PM  Result Value Ref Range   Glucose-Capillary 207 (H) 70 - 99 mg/dL    Comment: Glucose reference range applies only to samples taken after fasting for at least 8 hours.    Blood Alcohol level:  Lab Results  Component Value Date   [facility] <15 11/06/2023   ETH <15 08/25/2023    Metabolic Disorder Labs: Lab Results  Component Value Date   HGBA1C 10.6 (H) 11/06/2023   MPG 258 11/06/2023   MPG 223.08 08/25/2023   No results found for: PROLACTIN Lab Results  Component Value Date   CHOL 279 (H) 11/06/2023   TRIG 282 (H) 11/06/2023   HDL 60 11/06/2023   CHOLHDL 4.7 11/06/2023   VLDL 56 (H) 11/06/2023   LDLCALC 163 (H) 11/06/2023   LDLCALC 57 08/25/2023    Physical Findings: AIMS:  , ,  ,  ,    CIWA:    COWS:      Psychiatric Specialty Exam:  Presentation  General Appearance:  Appropriate for Environment  Eye Contact: Good  Speech: Normal Rate  Speech Volume: Normal    Mood and Affect  Mood: Euthymic  Affect: Appropriate   Thought Process  Thought Processes: Coherent; Linear  Descriptions of Associations:Intact  Orientation:Full (Time, Place and Person)  Thought Content:WDL  Hallucinations:Hallucinations: None  Ideas of Reference:None  Suicidal Thoughts:Suicidal Thoughts: No  Homicidal Thoughts:Homicidal Thoughts: No   Sensorium  Memory: Immediate Good; Recent Good; Remote Good  Judgment: Good  Insight: Good   Executive Functions  Concentration: Good  Attention Span: Good  Recall: Good  Fund of Knowledge: Good  Language: Good   Psychomotor Activity  Psychomotor Activity: Psychomotor Activity:  Normal  Musculoskeletal: Strength & Muscle Tone: within normal limits Gait & Station: normal Assets  Assets: Manufacturing systems engineer; Desire for Improvement    Physical Exam: Physical Exam ROS Blood pressure (!) 156/76, pulse 78, temperature (!) 97.3 F (36.3 C), resp. rate 18, height 5' 8 (1.727 m), weight 108.4 kg, SpO2 98%. Body mass index is 36.34 kg/m.  Diagnosis: Principal Problem:   MDD (major depressive disorder)   PLAN: Safety and Monitoring:  -- Voluntary admission to inpatient psychiatric unit for safety, stabilization and treatment  -- Daily contact with patient to assess and evaluate symptoms and progress in treatment  -- Patient's case to be discussed in multi-disciplinary team meeting  -- Observation Level : q15 minute checks  -- Vital signs:  q12 hours  -- Precautions: suicide, elopement, and assault -- Encouraged patient to participate in unit milieu and in scheduled group therapies  2. Psychiatric Diagnoses and Treatment:  Continue abilify  10 mg at bedtime and 5 mg daily Continue sertraline  150 mg daily      3. Medical Issues Being Addressed:     4. Discharge Planning:   -- Social work and case management to assist with discharge planning and identification of hospital follow-up needs prior to discharge  -- Estimated LOS: 3-4 days  Daine KATHEE Ober, NP 11/12/2023, 4:31 PM

## 2023-11-12 NOTE — Group Note (Signed)
 LCSW Group Therapy Note  Group Date: 11/12/2023 Start Time: 1300 End Time: 1400   Type of Therapy and Topic:  Group Therapy - Healthy vs Unhealthy Coping Skills  Participation Level:  Active   Description of Group The focus of this group was to determine what unhealthy coping techniques typically are used by group members and what healthy coping techniques would be helpful in coping with various problems. Patients were guided in becoming aware of the differences between healthy and unhealthy coping techniques. Patients were asked to identify 2-3 healthy coping skills they would like to learn to use more effectively.  Therapeutic Goals Patients learned that coping is what human beings do all day long to deal with various situations in their lives Patients defined and discussed healthy vs unhealthy coping techniques Patients identified their preferred coping techniques and identified whether these were healthy or unhealthy Patients determined 2-3 healthy coping skills they would like to become more familiar with and use more often. Patients provided support and ideas to each other   Summary of Patient Progress:  The patient and CSW explored personal triggers and coping skills. Patients completed a thermometer activity, identifying various areas of their lives and rating the emotional intensity of each. Together, the patients and CSW discussed each trigger and collaboratively brainstormed strategies to reduce the associated stress. The patient provided constructive feedback to peers and actively participated in discussions about their own struggles with triggers, sharing how they have worked to manage and navigate them.   Therapeutic Modalities Cognitive Behavioral Therapy Motivational Interviewing  Darianny Momon M Lamarius Dirr, LCSWA 11/12/2023  2:55 PM

## 2023-11-12 NOTE — [facility] Suicide Risk Assessment (Signed)
 [facility] INPATIENT:  Family/Significant Other Suicide Prevention Education  Suicide Prevention Education:  Contact Attempts: [name], [phone], Mother, has been identified by the patient as the family member/significant other with whom the patient will be residing, and identified as the person(s) who will aid the patient in the event of a mental health crisis.  With written consent from the patient, two attempts were made to provide suicide prevention education, prior to and/or following the patient's discharge.  We were unsuccessful in providing suicide prevention education.  A suicide education pamphlet was given to the patient to share with family/significant other.   Date and time of first attempt:11/11/23 at 9:54 AM   Date and time of second attempt: 11/12/23 at 2:24 PM

## 2023-11-12 NOTE — Progress Notes (Signed)
   11/12/23 1708  Psych Admission Type (Psych Patients Only)  Admission Status Voluntary  Psychosocial Assessment  Patient Complaints Anxiety  Eye Contact Fair  Facial Expression Flat  Affect Blunted  Speech Logical/coherent  Interaction Assertive  Motor Activity Slow  Appearance/Hygiene Unremarkable  Behavior Characteristics Cooperative  Mood Pleasant  Thought Process  Coherency WDL  Content WDL  Delusions None reported or observed  Perception WDL  Hallucination None reported or observed  Judgment Impaired  Confusion None  Danger to Self  Current suicidal ideation? Denies

## 2023-11-12 NOTE — Plan of Care (Signed)
 ?  Problem: Education: ?Goal: Mental status will improve ?Outcome: Progressing ?Goal: Verbalization of understanding the information provided will improve ?Outcome: Progressing ?  ?

## 2023-11-12 NOTE — Progress Notes (Signed)
 [facility] MD Progress Note  11/12/2023 7:53 AM [name]  MRN:  [id number]  63-year-old [name] presents to [facility] via [facility] for suicidal ideations with a plan to overdose on fentanyl  he can buy off the street. Reported negative intrusive thoughts started yesterday (11/05/2023) and worsen overnight with progressive suicidal thought Patient is admitted to adult psych unit with Q15 min safety monitoring. Multidisciplinary team approach is offered. Medication management; group/milieu therapy is offered.   Subjective:  Chart reviewed, case discussed in multidisciplinary meeting, patient seen during rounds.  Patient is noted to be resting in bed.  He offers no complaints.  He reports that in the afternoon his depression and suicidal ideation gets worse.  He denies having any urges of harming himself today and is aware to come outside of his room and talk to your nurse if the urges continue.  He denies any homicidal ideation today he denies auditory/visual hallucinations.  Per nursing report patient is displaying safe behaviors on the unit.  We did discuss his Abilify  being optimized to 5 mg in the morning and 10 mg at night.  Patient denies any side effects to the medications.  Sleep: Fair  Appetite:  Fair  Past Psychiatric History: see h&P Family History:  Family History  Problem Relation Age of Onset   Arthritis Mother    Stroke Father 58       heavy smoker 4-5 packs a day unfiltered   Schizophrenia Father        homeless and did not get medical care in time   Alcoholism Father    Colon cancer Neg Hx    Esophageal cancer Neg Hx    Rectal cancer Neg Hx    Stomach cancer Neg Hx    Colon polyps Neg Hx    Social History:  Social History   Substance and Sexual Activity  Alcohol Use Yes   Alcohol/week: 5.0 standard drinks of alcohol   Types: 2 Glasses of wine, 3 Cans of beer per week   Comment: SOCIAL 3 TIMES PER WEEK 2 DRINKS AT A TIME     Social History   Substance and Sexual  Activity  Drug Use Not Currently   Types: Marijuana   Comment: HX OF MONTHS AGO    Social History   Socioeconomic History   Marital status: Single    Spouse name: Not on file   Number of children: Not on file   Years of education: Not on file   Highest education level: Bachelor's degree (e.g., BA, AB, BS)  Occupational History   Not on file  Tobacco Use   Smoking status: Former    Current packs/day: 0.00    Average packs/day: 0.5 packs/day for 5.0 years (2.5 ttl pk-yrs)    Types: Cigarettes    Start date: 05/20/1983    Quit date: 05/19/1988    Years since quitting: 35.5   Smokeless tobacco: Never   Tobacco comments:    quit in 1990  Vaping Use   Vaping status: Never Used  Substance and Sexual Activity   Alcohol use: Yes    Alcohol/week: 5.0 standard drinks of alcohol    Types: 2 Glasses of wine, 3 Cans of beer per week    Comment: SOCIAL 3 TIMES PER WEEK 2 DRINKS AT A TIME   Drug use: Not Currently    Types: Marijuana    Comment: HX OF MONTHS AGO   Sexual activity: Never  Other Topics Concern   Not on file  Social History Narrative   Single. 1 dog, 2 cats. Lives alone.       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 Enjoys dinner parties.    Social Drivers of Health   Financial Resource Strain: High Risk (08/09/2023)   Overall Financial Resource Strain (CARDIA)    Difficulty of Paying Living Expenses: Hard  Food Insecurity: Food Insecurity Present (11/07/2023)   Hunger Vital Sign    Worried About Running Out of Food in the Last Year: Sometimes true    Ran Out of Food in the Last Year: Sometimes true  Transportation Needs: No Transportation Needs (11/07/2023)   PRAPARE - Administrator, Civil Service (Medical): No     Lack of Transportation (Non-Medical): No  Recent Concern: Transportation Needs - Unmet Transportation Needs (08/09/2023)   PRAPARE - Transportation    Lack of Transportation (Medical): Yes    Lack of Transportation (Non-Medical): Yes  Physical Activity: Sufficiently Active (08/09/2023)   Exercise Vital Sign    Days of Exercise per Week: 7 days    Minutes of Exercise per Session: 30 min  Stress: Stress Concern Present (08/09/2023)   [employer] of Occupational Health - Occupational Stress Questionnaire    Feeling of Stress : To some extent  Social Connections: Moderately Integrated (08/09/2023)   Social Connection and Isolation Panel    Frequency of Communication with Friends and Family: More than three times a week    Frequency of Social Gatherings with Friends and Family: Once a week    Attends Religious Services: More than 4 times per year    Active Member of Clubs or Organizations: Yes    Attends Engineer, structural: More than 4 times per year    Marital Status: Never married   Past Medical History:  Past Medical History:  Diagnosis Date   Cancer (HCC)    melanoma;  right ear   Depression    Diabetes mellitus    GERD (gastroesophageal reflux disease)    Hyperlipidemia    Hypertension    Metabolic syndrome    Obesity    PONV (postoperative nausea and vomiting)    YRS AGO WITHER ETHER    Past Surgical History:  Procedure Laterality Date   COLONOSCOPY  05/01/2013   CYST REMOVAL TRUNK  1989   benign   MELANOMA EXCISION  2008   right ear and neck   ORIF HUMERUS FRACTURE Left 12/23/2020   Procedure: OPEN REDUCTION INTERNAL FIXATION (ORIF) PROXIMAL HUMERUS FRACTURE;  Surgeon: [name], MD;  Location: [facility];  Service: Orthopedics;  Laterality: Left;    POLYPECTOMY     reconstructive plastic surgery face  1967   dog bite at age 70; about 33 surgeries for 10 years   TONSILLECTOMY  1966   age 9    Current Medications: Current Facility-Administered  Medications  Medication Dose Route Frequency Provider Last Rate Last Admin   acetaminophen  (TYLENOL ) tablet 650 mg  650 mg Oral Q6H PRN Ajibola, Ene A, NP   650 mg at 11/12/23 0413   alum & mag hydroxide-simeth (MAALOX/MYLANTA) 200-200-20 MG/5ML suspension 30 mL  30 mL Oral Q4H PRN Ajibola, Ene A, NP       ARIPiprazole  (ABILIFY ) tablet 10  mg  10 mg Oral QHS Thaine Garriga, MD   10 mg at 11/11/23 2103   ARIPiprazole  (ABILIFY ) tablet 5 mg  5 mg Oral Daily Tramaine Sauls, MD   5 mg at 11/11/23 9185   atorvastatin  (LIPITOR) tablet 40 mg  40 mg Oral Daily Ajibola, Ene A, NP   40 mg at 11/11/23 0813   glipiZIDE  (GLUCOTROL  XL) 24 hr tablet 5 mg  5 mg Oral Daily Ajibola, Ene A, NP   5 mg at 11/11/23 0814   hydrOXYzine  (ATARAX ) tablet 25 mg  25 mg Oral TID PRN Ajibola, Ene A, NP   25 mg at 11/08/23 2056   insulin  aspart (novoLOG ) injection 0-15 Units  0-15 Units Subcutaneous TID WC Ajibola, Ene A, NP   2 Units at 11/11/23 1725   insulin  aspart (novoLOG ) injection 0-5 Units  0-5 Units Subcutaneous QHS Ajibola, Ene A, NP   2 Units at 11/10/23 2121   lisinopril  (ZESTRIL ) tablet 40 mg  40 mg Oral Daily Ajibola, Ene A, NP   40 mg at 11/11/23 0813   magnesium  hydroxide (MILK OF MAGNESIA) suspension 30 mL  30 mL Oral Daily PRN Ajibola, Ene A, NP       metFORMIN  (GLUCOPHAGE ) tablet 1,000 mg  1,000 mg Oral BID WC Ajibola, Ene A, NP   1,000 mg at 11/11/23 1725   pantoprazole  (PROTONIX ) EC tablet 40 mg  40 mg Oral Daily Ajibola, Ene A, NP   40 mg at 11/11/23 0813   sertraline  (ZOLOFT ) tablet 150 mg  150 mg Oral Daily Ajibola, Ene A, NP   150 mg at 11/11/23 0813   traZODone  (DESYREL ) tablet 50 mg  50 mg Oral QHS PRN Ajibola, Ene A, NP   50 mg at 11/11/23 2103    Lab Results:  Results for orders placed or performed during the hospital encounter of 11/06/23 (from the past 48 hours)  Glucose, capillary     Status: Abnormal   Collection Time: 11/10/23 11:38 AM  Result Value Ref Range   Glucose-Capillary 270 (H) 70  - 99 mg/dL    Comment: Glucose reference range applies only to samples taken after fasting for at least 8 hours.   Comment 1 Notify RN   Glucose, capillary     Status: Abnormal   Collection Time: 11/10/23  5:26 PM  Result Value Ref Range   Glucose-Capillary 138 (H) 70 - 99 mg/dL    Comment: Glucose reference range applies only to samples taken after fasting for at least 8 hours.  Glucose, capillary     Status: Abnormal   Collection Time: 11/10/23  8:00 PM  Result Value Ref Range   Glucose-Capillary 208 (H) 70 - 99 mg/dL    Comment: Glucose reference range applies only to samples taken after fasting for at least 8 hours.  Glucose, capillary     Status: Abnormal   Collection Time: 11/11/23  7:27 AM  Result Value Ref Range   Glucose-Capillary 195 (H) 70 - 99 mg/dL    Comment: Glucose reference range applies only to samples taken after fasting for at least 8 hours.   Comment 1 Notify RN   Glucose, capillary     Status: Abnormal   Collection Time: 11/11/23 11:46 AM  Result Value Ref Range   Glucose-Capillary 243 (H) 70 - 99 mg/dL    Comment: Glucose reference range applies only to samples taken after fasting for at least 8 hours.  Glucose, capillary     Status: Abnormal   Collection  Time: 11/11/23  4:48 PM  Result Value Ref Range   Glucose-Capillary 136 (H) 70 - 99 mg/dL    Comment: Glucose reference range applies only to samples taken after fasting for at least 8 hours.  Glucose, capillary     Status: Abnormal   Collection Time: 11/11/23  8:11 PM  Result Value Ref Range   Glucose-Capillary 187 (H) 70 - 99 mg/dL    Comment: Glucose reference range applies only to samples taken after fasting for at least 8 hours.    Blood Alcohol level:  Lab Results  Component Value Date   [place] <15 11/06/2023   ETH <15 08/25/2023    Metabolic Disorder Labs: Lab Results  Component Value Date   HGBA1C 10.6 (H) 11/06/2023   MPG 258 11/06/2023   MPG 223.08 08/25/2023   No results found for:  PROLACTIN Lab Results  Component Value Date   CHOL 279 (H) 11/06/2023   TRIG 282 (H) 11/06/2023   HDL 60 11/06/2023   CHOLHDL 4.7 11/06/2023   VLDL 56 (H) 11/06/2023   LDLCALC 163 (H) 11/06/2023   LDLCALC 57 08/25/2023    Physical Findings: AIMS:  , ,  ,  ,    CIWA:    COWS:      Psychiatric Specialty Exam:  Presentation  General Appearance:  Appropriate for Environment; Disheveled  Eye Contact: Fair  Speech: Clear and Coherent  Speech Volume: Normal    Mood and Affect  Mood: Depressed; Anxious  Affect: Congruent   Thought Process  Thought Processes: Coherent  Descriptions of Associations:Intact  Orientation:Full (Time, Place and Person)  Thought Content:Logical  Hallucinations: Denies Ideas of Reference:None  Suicidal Thoughts: Endorses SI with different plans Homicidal Thoughts: Denies  Sensorium  Memory: Immediate Fair  Judgment: Fair  Insight: Fair   Art therapist  Concentration: Fair  Attention Span: Fair  Recall: Fair  Fund of Knowledge: Fair  Language: Fair   Psychomotor Activity  Psychomotor Activity:No data recorded Musculoskeletal: Strength & Muscle Tone: within normal limits Gait & Station: normal Assets  Assets: Manufacturing systems engineer; Desire for Improvement; Financial Resources/Insurance; Intimacy; Physical Health; Resilience; Social Support    Physical Exam: Physical Exam Vitals and nursing note reviewed.    ROS Blood pressure (!) 148/74, pulse 67, temperature 97.8 F (36.6 C), temperature source Oral, resp. rate 18, height 5' 8 (1.727 m), weight 108.4 kg, SpO2 95%. Body mass index is 36.34 kg/m.  Diagnosis: Principal Problem:   MDD (major depressive disorder)   Safety and Monitoring:             -- Voluntary admission to inpatient psychiatric unit for safety, stabilization and treatment             -- Daily contact with patient to assess and evaluate symptoms and progress in  treatment             -- Patient's case to be discussed in multi-disciplinary team meeting             -- Observation Level: q15 minute checks             -- Vital signs:  q12 hours             -- Precautions: suicide, elopement, and assault   2. Psychiatric Diagnoses and Treatment:               Increased Abilify  10 mg nightly and 5 mg daily Zoloft  150 mg daily   -- The risks/benefits/side-effects/alternatives to this medication were  discussed in detail with the patient and time was given for questions. The patient consents to medication trial.                -- Metabolic profile and EKG monitoring obtained while on an atypical antipsychotic (BMI: Lipid Panel: HbgA1c: QTc:)              -- Encouraged patient to participate in unit milieu and in scheduled group therapies  4. Discharge Planning:   -- Social work and case management to assist with discharge planning and identification of hospital follow-up needs prior to discharge  -- Estimated LOS: 3-4 days  Tranisha Tissue, MD 11/12/2023, 7:53 AM

## 2023-11-13 LAB — GLUCOSE, CAPILLARY
Glucose-Capillary: 188 mg/dL — ABNORMAL HIGH (ref 70–99)
Glucose-Capillary: 256 mg/dL — ABNORMAL HIGH (ref 70–99)
Glucose-Capillary: 171 mg/dL — ABNORMAL HIGH (ref 70–99)
Glucose-Capillary: 234 mg/dL — ABNORMAL HIGH (ref 70–99)

## 2023-11-13 NOTE — Progress Notes (Signed)
   11/12/23 2000  Psych Admission Type (Psych Patients Only)  Admission Status Voluntary  Psychosocial Assessment  Patient Complaints Anxiety  Eye Contact Fair  Facial Expression Flat  Affect Appropriate to circumstance  Speech Logical/coherent  Interaction Assertive  Motor Activity Slow  Appearance/Hygiene Improved  Behavior Characteristics Cooperative;Appropriate to situation  Mood Pleasant  Aggressive Behavior  Effect No apparent injury  Thought Process  Coherency WDL  Content WDL  Delusions None reported or observed  Perception WDL  Hallucination None reported or observed  Judgment Poor  Confusion None  Danger to Self  Current suicidal ideation? Denies   Patient is alert and oriented x 4, no distress is noted, he was noted interacting with peers appropriately in the milieu.patient is complaint with medication and he denies SI/HI/AVH. 15 minutes safety checks maintained.

## 2023-11-13 NOTE — Progress Notes (Signed)
 [facility] MD Progress Note  11/13/2023 3:08 PM [name]  MRN:  [id number]  -year-old [name] presents to [facility] via [employer] for suicidal ideations with a plan to overdose on fentanyl  he can buy off the street. Reported negative intrusive thoughts started yesterday (11/05/2023) and worsen overnight with progressive suicidal thought Patient is admitted to adult psych unit with Q15 min safety monitoring. Multidisciplinary team approach is offered. Medication management; group/milieu therapy is offered.    Subjective:  Chart reviewed, case discussed in multidisciplinary meeting, patient seen during rounds.  Patient is noted to be resting in bed.  He offers no complaints.  He reports that in the afternoon his depression and suicidal ideation gets worse.  He denies having any urges of harming himself today and is aware to come outside of his room and talk to your nurse if the urges continue.  He denies any homicidal ideation today he denies auditory/visual hallucinations.  Per nursing report patient is displaying safe behaviors on the unit.  We did discuss his Abilify  being optimized to 5 mg in the morning and 10 mg at night.  Patient denies any side effects to the medications.  11/12/2023:  Chart reviewed, case discussed in multidisciplinary meeting, patient seen during rounds. Patient is assessed on the inpatient unit and reports that he is doing very well today. He affect is bright and reports that he is even mor optimistic regarding his recovery. Denies suicidal thoughts or negative thinking today. Has been compliant with medications and treatment planning. Patient reports that he is sleeping and eating well. Denies side effects of medications. Denies SI, HI, AVH.   11/13/2023: Chart reviewed, case discussed in multidisciplinary meeting, patient seen during rounds. Patient is assessed on the inpatient unit and reports that he continues to endorse good resolve of depression and suicidal thoughts. He  continues to have a bright affect and is alert and oriented x4. His only concern is housing after discharge and requests to speak to SW regarding options. He is sleeping and eating well. He is engaging in groups and with his peers. Denies SI, HI, AVH. Has been calm, cooperative, and compliant with treatment plan. Denies side effect to medications.    Sleep: Good  Appetite:  Good  Past Psychiatric History: see h&P Family History:  Family History  Problem Relation Age of Onset   Arthritis Mother    Stroke Father 43       heavy smoker 4-5 packs a day unfiltered   Schizophrenia Father        homeless and did not get medical care in time   Alcoholism Father    Colon cancer Neg Hx    Esophageal cancer Neg Hx    Rectal cancer Neg Hx    Stomach cancer Neg Hx    Colon polyps Neg Hx    Social History:  Social History   Substance and Sexual Activity  Alcohol Use Yes   Alcohol/week: 5.0 standard drinks of alcohol   Types: 2 Glasses of wine, 3 Cans of beer per week   Comment: SOCIAL 3 TIMES PER WEEK 2 DRINKS AT A TIME     Social History   Substance and Sexual Activity  Drug Use Not Currently   Types: Marijuana   Comment: HX OF MONTHS AGO    Social History   Socioeconomic History   Marital status: Single    Spouse name: Not on file   Number of children: Not on file   Years of education: Not  on file   Highest education level: Bachelor's degree (e.g., BA, AB, BS)  Occupational History   Not on file  Tobacco Use   Smoking status: Former    Current packs/day: 0.00    Average packs/day: 0.5 packs/day for 5.0 years (2.5 ttl pk-yrs)    Types: Cigarettes    Start date: 05/20/1983    Quit date: 05/19/1988    Years since quitting: 35.5   Smokeless tobacco: Never   Tobacco comments:    quit in 1990  Vaping Use   Vaping status: Never Used  Substance and Sexual Activity   Alcohol use: Yes    Alcohol/week: 5.0 standard drinks of alcohol    Types: 2 Glasses of wine, 3 Cans of beer  per week    Comment: SOCIAL 3 TIMES PER WEEK 2 DRINKS AT A TIME   Drug use: Not Currently    Types: Marijuana    Comment: HX OF MONTHS AGO   Sexual activity: Never  Other Topics Concern   Not on file  Social History Narrative   Single. 1 dog, 2 cats. Lives alone.       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 Enjoys dinner parties.    Social Drivers of Health   Financial Resource Strain: High Risk (08/09/2023)   Overall Financial Resource Strain (CARDIA)    Difficulty of Paying Living Expenses: Hard  Food Insecurity: Food Insecurity Present (11/07/2023)   Hunger Vital Sign    Worried About Running Out of Food in the Last Year: Sometimes true    Ran Out of Food in the Last Year: Sometimes true  Transportation Needs: No Transportation Needs (11/07/2023)   PRAPARE - Administrator, Civil Service (Medical): No    Lack of Transportation (Non-Medical): No  Recent Concern: Transportation Needs - Unmet Transportation Needs (08/09/2023)   PRAPARE - Transportation    Lack of Transportation (Medical): Yes    Lack of Transportation (Non-Medical): Yes  Physical Activity: Sufficiently Active (08/09/2023)   Exercise Vital Sign    Days of Exercise per Week: 7 days    Minutes of Exercise per Session: 30 min  Stress: Stress Concern Present (08/09/2023)   [employer] of Occupational Health - Occupational Stress Questionnaire    Feeling of Stress : To some extent  Social Connections: Moderately Integrated (08/09/2023)   Social Connection and Isolation Panel    Frequency of Communication with Friends and Family: More than three times a week    Frequency of Social Gatherings with Friends and Family: Once a week    Attends Religious Services: More  than 4 times per year    Active Member of [employer] or Organizations: Yes    Attends Engineer, structural: More than 4 times per year    Marital Status: Never married   Past Medical History:  Past Medical History:  Diagnosis Date   Cancer (HCC)    melanoma;  right ear   Depression    Diabetes mellitus    GERD (gastroesophageal reflux disease)    Hyperlipidemia    Hypertension    Metabolic syndrome    Obesity    PONV (postoperative nausea and vomiting)    YRS AGO WITHER ETHER    Past  Surgical History:  Procedure Laterality Date   COLONOSCOPY  05/01/2013   CYST REMOVAL TRUNK  1989   benign   MELANOMA EXCISION  2008   right ear and neck   ORIF HUMERUS FRACTURE Left 12/23/2020   Procedure: OPEN REDUCTION INTERNAL FIXATION (ORIF) PROXIMAL HUMERUS FRACTURE;  Surgeon: [name], MD;  Location: [facility];  Service: Orthopedics;  Laterality: Left;    POLYPECTOMY     reconstructive plastic surgery face  1967   dog bite at age 11; about 4 surgeries for 10 years   TONSILLECTOMY  1966   age 51    Current Medications: Current Facility-Administered Medications  Medication Dose Route Frequency Provider Last Rate Last Admin   acetaminophen  (TYLENOL ) tablet 650 mg  650 mg Oral Q6H PRN Ajibola, Ene A, NP   650 mg at 11/13/23 1236   alum & mag hydroxide-simeth (MAALOX/MYLANTA) 200-200-20 MG/5ML suspension 30 mL  30 mL Oral Q4H PRN Ajibola, Ene A, NP       ARIPiprazole  (ABILIFY ) tablet 10 mg  10 mg Oral QHS Jadapalle, Sree, MD   10 mg at 11/12/23 2139   ARIPiprazole  (ABILIFY ) tablet 5 mg  5 mg Oral Daily Jadapalle, Sree, MD   5 mg at 11/13/23 0844   atorvastatin  (LIPITOR) tablet 40 mg  40 mg Oral Daily Ajibola, Ene A, NP   40 mg at 11/13/23 0845   glipiZIDE  (GLUCOTROL  XL) 24 hr tablet 5 mg  5 mg Oral Daily Ajibola, Ene A, NP   5 mg at 11/13/23 0845   hydrOXYzine  (ATARAX ) tablet 25 mg  25 mg Oral TID PRN Ajibola, Ene A, NP   25 mg at 11/12/23 2139   insulin  aspart (novoLOG )  injection 0-15 Units  0-15 Units Subcutaneous TID WC Ajibola, Ene A, NP   8 Units at 11/13/23 1230   insulin  aspart (novoLOG ) injection 0-5 Units  0-5 Units Subcutaneous QHS Ajibola, Ene A, NP   3 Units at 11/12/23 2138   lisinopril  (ZESTRIL ) tablet 40 mg  40 mg Oral Daily Ajibola, Ene A, NP   40 mg at 11/13/23 0845   magnesium  hydroxide (MILK OF MAGNESIA) suspension 30 mL  30 mL Oral Daily PRN Ajibola, Ene A, NP       metFORMIN  (GLUCOPHAGE ) tablet 1,000 mg  1,000 mg Oral BID WC Ajibola, Ene A, NP   1,000 mg at 11/13/23 0844   pantoprazole  (PROTONIX ) EC tablet 40 mg  40 mg Oral Daily Ajibola, Ene A, NP   40 mg at 11/13/23 0845   sertraline  (ZOLOFT ) tablet 150 mg  150 mg Oral Daily Ajibola, Ene A, NP   150 mg at 11/13/23 0845   traZODone  (DESYREL ) tablet 50 mg  50 mg Oral QHS PRN Ajibola, Ene A, NP   50 mg at 11/12/23 2139    Lab Results:  Results for orders placed or performed during the hospital encounter of 11/06/23 (from the past 48 hours)  Glucose, capillary     Status: Abnormal   Collection Time: 11/11/23  4:48 PM  Result Value Ref Range   Glucose-Capillary 136 (H) 70 - 99 mg/dL    Comment: Glucose reference range applies only to samples taken after fasting for at least 8 hours.  Glucose, capillary     Status: Abnormal   Collection Time: 11/11/23  8:11 PM  Result Value Ref Range   Glucose-Capillary 187 (H) 70 - 99 mg/dL    Comment: Glucose reference range applies only to samples taken after fasting for at  least 8 hours.  Glucose, capillary     Status: Abnormal   Collection Time: 11/12/23  7:52 AM  Result Value Ref Range   Glucose-Capillary 194 (H) 70 - 99 mg/dL    Comment: Glucose reference range applies only to samples taken after fasting for at least 8 hours.   Comment 1 Notify RN   Glucose, capillary     Status: Abnormal   Collection Time: 11/12/23 12:21 PM  Result Value Ref Range   Glucose-Capillary 207 (H) 70 - 99 mg/dL    Comment: Glucose reference range applies only to  samples taken after fasting for at least 8 hours.  Glucose, capillary     Status: Abnormal   Collection Time: 11/12/23  4:34 PM  Result Value Ref Range   Glucose-Capillary 192 (H) 70 - 99 mg/dL    Comment: Glucose reference range applies only to samples taken after fasting for at least 8 hours.  Glucose, capillary     Status: Abnormal   Collection Time: 11/12/23  9:33 PM  Result Value Ref Range   Glucose-Capillary 170 (H) 70 - 99 mg/dL    Comment: Glucose reference range applies only to samples taken after fasting for at least 8 hours.  Glucose, capillary     Status: Abnormal   Collection Time: 11/13/23  7:00 AM  Result Value Ref Range   Glucose-Capillary 188 (H) 70 - 99 mg/dL    Comment: Glucose reference range applies only to samples taken after fasting for at least 8 hours.  Glucose, capillary     Status: Abnormal   Collection Time: 11/13/23 11:49 AM  Result Value Ref Range   Glucose-Capillary 256 (H) 70 - 99 mg/dL    Comment: Glucose reference range applies only to samples taken after fasting for at least 8 hours.    Blood Alcohol level:  Lab Results  Component Value Date   [facility] <15 11/06/2023   ETH <15 08/25/2023    Metabolic Disorder Labs: Lab Results  Component Value Date   HGBA1C 10.6 (H) 11/06/2023   MPG 258 11/06/2023   MPG 223.08 08/25/2023   No results found for: PROLACTIN Lab Results  Component Value Date   CHOL 279 (H) 11/06/2023   TRIG 282 (H) 11/06/2023   HDL 60 11/06/2023   CHOLHDL 4.7 11/06/2023   VLDL 56 (H) 11/06/2023   LDLCALC 163 (H) 11/06/2023   LDLCALC 57 08/25/2023    Physical Findings: AIMS:  , ,  ,  ,    CIWA:    COWS:      Psychiatric Specialty Exam:  Presentation  General Appearance:  Appropriate for Environment  Eye Contact: Good  Speech: Normal Rate  Speech Volume: Normal    Mood and Affect  Mood: Euthymic  Affect: Appropriate   Thought Process  Thought Processes: Coherent; Linear  Descriptions of  Associations:Intact  Orientation:Full (Time, Place and Person)  Thought Content:WDL  Hallucinations:Hallucinations: None  Ideas of Reference:None  Suicidal Thoughts:Suicidal Thoughts: No  Homicidal Thoughts:Homicidal Thoughts: No   Sensorium  Memory: Immediate Good; Recent Good; Remote Good  Judgment: Good  Insight: Good   Executive Functions  Concentration: Good  Attention Span: Good  Recall: Good  Fund of Knowledge: Good  Language: Good   Psychomotor Activity  Psychomotor Activity: Psychomotor Activity: Normal  Musculoskeletal: Strength & Muscle Tone: within normal limits Gait & Station: normal Assets  Assets: Manufacturing systems engineer; Desire for Improvement    Physical Exam: Physical Exam ROS Blood pressure (!) 154/78, pulse 71, temperature (!)  97.2 F (36.2 C), resp. rate 20, height 5' 8 (1.727 m), weight 108.4 kg, SpO2 98%. Body mass index is 36.34 kg/m.  Diagnosis: Principal Problem:   MDD (major depressive disorder)   PLAN: Safety and Monitoring:  -- Voluntary admission to inpatient psychiatric unit for safety, stabilization and treatment  -- Daily contact with patient to assess and evaluate symptoms and progress in treatment  -- Patient's case to be discussed in multi-disciplinary team meeting  -- Observation Level : q15 minute checks  -- Vital signs:  q12 hours  -- Precautions: suicide, elopement, and assault -- Encouraged patient to participate in unit milieu and in scheduled group therapies  2. Psychiatric Diagnoses and Treatment:  Continue abilify  10 mg at bedtime and 5 mg daily Continue sertraline  150 mg daily      3. Medical Issues Being Addressed:     4. Discharge Planning:   -- Social work and case management to assist with discharge planning and identification of hospital follow-up needs prior to discharge  -- Estimated LOS: 3-4 days  Daine KATHEE Ober, NP 11/13/2023, 3:08 PM

## 2023-11-13 NOTE — Group Note (Signed)
 Date:  11/13/2023 Time:  10:31 AM  Group Topic/Focus:  Goals Group:   The focus of this group is to help patients establish daily goals to achieve during treatment and discuss how the patient can incorporate goal setting into their daily lives to aide in recovery.    Participation Level:  Active  Participation Quality:  Appropriate  Affect:  Appropriate  Cognitive:  Alert  Insight: Appropriate  Engagement in Group:  Engaged  Modes of Intervention:  Activity, Discussion, and Education  Additional Comments:    [name] 11/13/2023, 10:31 AM

## 2023-11-13 NOTE — Progress Notes (Signed)
   11/13/23 0957  Psych Admission Type (Psych Patients Only)  Admission Status Voluntary  Psychosocial Assessment  Patient Complaints Anxiety;Depression (anxiety 3 out of 10, Depression 1 out of 10)  Eye Contact Fair  Facial Expression Flat  Affect Flat  Speech Logical/coherent  Interaction Assertive  Motor Activity Slow  Appearance/Hygiene Improved  Behavior Characteristics Cooperative  Mood Euthymic;Pleasant  Aggressive Behavior  Effect No apparent injury  Thought Process  Coherency WDL  Content WDL  Delusions None reported or observed  Perception WDL  Hallucination None reported or observed  Judgment Limited  Confusion None  Danger to Self  Current suicidal ideation? Denies  Self-Injurious Behavior No self-injurious ideation or behavior indicators observed or expressed   Agreement Not to Harm Self Yes  Description of Agreement verbal  Danger to Others  Danger to Others None reported or observed

## 2023-11-13 NOTE — Group Note (Signed)
 Date:  11/13/2023 Time:  8:59 PM  Group Topic/Focus:  Wrap-Up Group:   The focus of this group is to help patients review their daily goal of treatment and discuss progress on daily workbooks.    Participation Level:  Active  Participation Quality:  Appropriate, Attentive, and Supportive  Affect:  Appropriate  Cognitive:  Appropriate  Insight: Appropriate and Good  Engagement in Group:  Supportive  Modes of Intervention:  Discussion  Additional Comments:     Kerri Katz 11/13/2023, 8:59 PM

## 2023-11-13 NOTE — Plan of Care (Signed)

## 2023-11-13 NOTE — Plan of Care (Signed)
  Problem: Education: Goal: Knowledge of [facility] Education information/materials will improve Outcome: Progressing   Problem: Education: Goal: Emotional status will improve Outcome: Progressing   Problem: Education: Goal: Knowledge of [facility] Education information/materials will improve Outcome: Progressing Goal: Emotional status will improve Outcome: Progressing Goal: Mental status will improve Outcome: Progressing Goal: Verbalization of understanding the information provided will improve Outcome: Progressing

## 2023-11-14 DIAGNOSIS — F332 Major depressive disorder, recurrent severe without psychotic features: Principal | ICD-10-CM

## 2023-11-14 LAB — GLUCOSE, CAPILLARY
Glucose-Capillary: 170 mg/dL — ABNORMAL HIGH (ref 70–99)
Glucose-Capillary: 224 mg/dL — ABNORMAL HIGH (ref 70–99)
Glucose-Capillary: 242 mg/dL — ABNORMAL HIGH (ref 70–99)
Glucose-Capillary: 209 mg/dL — ABNORMAL HIGH (ref 70–99)

## 2023-11-14 NOTE — Progress Notes (Signed)
   11/13/23 2000  Psych Admission Type (Psych Patients Only)  Admission Status Voluntary  Psychosocial Assessment  Patient Complaints Anxiety  Eye Contact Fair  Facial Expression Flat  Affect Appropriate to circumstance  Speech Logical/coherent  Interaction Assertive  Motor Activity Slow  Appearance/Hygiene Improved  Behavior Characteristics Cooperative;Appropriate to situation  Mood Pleasant  Aggressive Behavior  Effect No apparent injury  Thought Process  Coherency WDL  Content WDL  Delusions None reported or observed  Perception WDL  Hallucination None reported or observed  Judgment Poor  Confusion None  Danger to Self  Current suicidal ideation? Denies   Patient is alert and oriented x 4, receptive to staff, denies SI/HI/AVH thoughts are organized, affect congruent with mood, 15 minutes safety checks maintained.

## 2023-11-14 NOTE — Group Note (Signed)
 Recreation Therapy Group Note   Group Topic:Coping Skills  Group Date: 11/14/2023 Start Time: 1035 End Time: 1140 Facilitators: Celestia Jeoffrey BRAVO, LRT, CTRS Location: Craft Room  Group Description: Leisure. Patients were given the option to choose from singing karaoke, coloring mandalas, using oil pastels, journaling, painting or playing with play-doh. LRT and pts discussed the meaning of leisure, the importance of participating in leisure during their free time/when they're outside of the hospital, as well as how our leisure interests can also serve as coping skills.   Goal Area(s) Addressed:  Patient will identify a current leisure interest.  Patient will learn the definition of "leisure". Patient will practice making a positive decision. Patient will have the opportunity to try a new leisure activity. Patient will communicate with peers and LRT.    Affect/Mood: Appropriate   Participation Level: Active and Engaged   Participation Quality: Independent   Behavior: Appropriate, Calm, and Cooperative   Speech/Thought Process: Coherent   Insight: Good   Judgement: Good   Modes of Intervention: Activity, Music, Rapport Building, and Socialization   Patient Response to Interventions:  Attentive, Engaged, Interested , and Receptive   Education Outcome:  Acknowledges education   Clinical Observations/Individualized Feedback: [name] was active in their participation of session activities and group discussion. Pt chose to sing karaoke while in group. Pt interacted well with LRT and peers duration of session.    Plan: Continue to engage patient in RT group sessions 2-3x/week.   Jeoffrey BRAVO Celestia, LRT, CTRS 11/14/2023 2:08 PM

## 2023-11-14 NOTE — Group Note (Signed)
 [facility] LCSW Group Therapy Note    Group Date: 11/14/2023 Start Time: 1300 End Time: 1400  Type of Therapy and Topic:  Group Therapy:  Overcoming Obstacles  Participation Level:  [facility] PARTICIPATION LEVEL: Minimal   Description of Group:   In this group patients will be encouraged to explore what they see as obstacles to their own wellness and recovery. They will be guided to discuss their thoughts, feelings, and behaviors related to these obstacles. The group will process together ways to cope with barriers, with attention given to specific choices patients can make. Each patient will be challenged to identify changes they are motivated to make in order to overcome their obstacles. This group will be process-oriented, with patients participating in exploration of their own experiences as well as giving and receiving support and challenge from other group members.  Therapeutic Goals: 1. Patient will identify personal and current obstacles as they relate to admission. 2. Patient will identify barriers that currently interfere with their wellness or overcoming obstacles.  3. Patient will identify feelings, thought process and behaviors related to these barriers. 4. Patient will identify two changes they are willing to make to overcome these obstacles:    Summary of Patient Progress Patient was present for the second half of the group. He did not contribute much to the larger discussion but did appear to attend to the discussion as he could be seen nodding when others were speaking.    Therapeutic Modalities:   Cognitive Behavioral Therapy Solution Focused Therapy Motivational Interviewing Relapse Prevention Therapy   Nadara JONELLE Fam, LCSW
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
## 2023-11-14 NOTE — [facility] Counselor (Signed)
 Patient reported that his eviction will be processed by the end of the week.  Patient provided with [facility] contact information to conduct a phone screening to assess for possible placement at one of their sober living homes.   Patient conducted phone screening with director of [facility] consultant, Mr. Ula.   CSW to continue to assess.   Kaedan Richert, MSW, LCSWA 11/14/2023 1:18 PM

## 2023-11-14 NOTE — Group Note (Signed)
 Date:  11/14/2023 Time:  4:49 PM  Group Topic/Focus:  Healthy Communication:   The focus of this group is to discuss communication, barriers to communication, as well as healthy ways to communicate with others.    Participation Level:  Active  Participation Quality:  Appropriate  Affect:  Appropriate  Cognitive:  Alert  Insight: Appropriate  Engagement in Group:  Engaged  Modes of Intervention:  Activity, Discussion, and Education  Additional Comments:    Skippy LITTIE Bennett 11/14/2023, 4:49 PM

## 2023-11-14 NOTE — Progress Notes (Signed)
   11/14/23 0805  Psych Admission Type (Psych Patients Only)  Admission Status Voluntary  Psychosocial Assessment  Patient Complaints Anxiety (3 out of 10)  Eye Contact Fair  Facial Expression Flat  Affect Flat  Speech Logical/coherent  Interaction Attention-seeking  Motor Activity Slow  Appearance/Hygiene Improved  Behavior Characteristics Cooperative  Mood Pleasant  Aggressive Behavior  Effect No apparent injury  Thought Process  Coherency WDL  Content WDL  Delusions None reported or observed  Perception WDL  Hallucination None reported or observed  Judgment Poor  Confusion None  Danger to Self  Current suicidal ideation? Denies  Self-Injurious Behavior No self-injurious ideation or behavior indicators observed or expressed   Agreement Not to Harm Self Yes  Description of Agreement verbal  Danger to Others  Danger to Others None reported or observed

## 2023-11-14 NOTE — Progress Notes (Signed)
 Physical Therapy Treatment Patient Details Name: [name] MRN: [id number] DOB: 1960/09/10 Today's Date: 11/14/2023   History of Present Illness Pt is a 63 yr old male who presents to the inpatient psychiatric unit for suicidal ideation and plan. PMH of DM, HTN, hypercholesterinemia, anxiety, and depression with severe features, h/o multiple hospitalizations for severe depression x5 this past year, last one 08/26/23. Pt also has history of multiple suicide attempts the last one being in 03/2023.    PT Comments  Pt was pleasant and motivated to participate during the session and put forth good effort throughout. Pt participated with below balance training activities with occasional min A to prevent LOB most notably with braiding and with feet together with head turns.  Pt generally steady with no overt LOB with side stepping and backwards amb both with eyes open and eyes closed.  Session cut somewhat short by group session going outdoors with pt wishing to participate.  Pt will benefit from continued PT services upon discharge to safely address deficits listed in patient problem list for decreased caregiver assistance and eventual return to PLOF.      If plan is discharge home, recommend the following: A little help with walking and/or transfers;Assist for transportation;Help with stairs or ramp for entrance   Can travel by private vehicle        Equipment Recommendations  Other (comment) (pt declined AD)    Recommendations for Other Services       Precautions / Restrictions Precautions Precautions: Fall Recall of Precautions/Restrictions: Intact Restrictions Weight Bearing Restrictions Per Provider Order: No     Mobility  Bed Mobility               General bed mobility comments: NT    Transfers Overall transfer level: Independent                 General transfer comment: Good control and stability with transfers from low height surface     Ambulation/Gait Ambulation/Gait assistance: Supervision Gait Distance (Feet): 125 Feet Assistive device: None Gait Pattern/deviations: Step-through pattern, Decreased step length - right, Decreased step length - left, Wide base of support Gait velocity: decreased     General Gait Details: Min reduced cadence with wide BOS but generally steady with no overt LOB   Stairs             Wheelchair Mobility     Tilt Bed    Modified Rankin (Stroke Patients Only)       Balance Overall balance assessment: Mild deficits observed, not formally tested                                          Communication Communication Communication: No apparent difficulties  Cognition Arousal: Alert Behavior During Therapy: WFL for tasks assessed/performed   PT - Cognitive impairments: No apparent impairments                         Following commands: Intact      Cueing Cueing Techniques: Verbal cues  Exercises Other Exercises Other Exercises: Dynamic standing balance training with side stepping left/righ with eyes open/closed, backwards walking with eyes open/closed, and braiding with eyes open Other Exercises: Static standing balance training with feet together and semi-tandem with combinations of eyes open/closed and head still/head turns    General Comments  Pertinent Vitals/Pain Pain Assessment Pain Assessment: No/denies pain    Home Living                          Prior Function            PT Goals (current goals can now be found in the care plan section) Progress towards PT goals: Progressing toward goals    Frequency    Min 2X/week      PT Plan      Co-evaluation              AM-PAC PT 6 Clicks Mobility   Outcome Measure  Help needed turning from your back to your side while in a flat bed without using bedrails?: None Help needed moving from lying on your back to sitting on the side of a flat bed  without using bedrails?: None Help needed moving to and from a bed to a chair (including a wheelchair)?: None Help needed standing up from a chair using your arms (e.g., wheelchair or bedside chair)?: None Help needed to walk in hospital room?: A Little Help needed climbing 3-5 steps with a railing? : A Little 6 Click Score: 22    End of Session Equipment Utilized During Treatment: Gait belt Activity Tolerance: Patient tolerated treatment well Patient left: in chair Nurse Communication: Mobility status PT Visit Diagnosis: Unsteadiness on feet (R26.81);History of falling (Z91.81)     Time: 8594-8577 PT Time Calculation (min) (ACUTE ONLY): 17 min  Charges:    $Therapeutic Exercise: 8-22 mins PT General Charges $$ ACUTE PT VISIT: 1 Visit                     D. Scott Dason Mosley PT, DPT 11/14/23, 2:40 PM

## 2023-11-14 NOTE — BHH Counselor (Signed)
 Patient expressed to CSW that he is possibly unhoused now due to not paying rent.   CSW has added patient to the [facility] waiting list.   Saleah Rishel, MSW, [facility] 11/14/2023 10:02 AM

## 2023-11-14 NOTE — Progress Notes (Signed)
 [facility] MD Progress Note  11/14/2023 4:17 PM [name]  MRN:  [id number]  -year-old [name] presents to [facility] via [employer] for suicidal ideations with a plan to overdose on fentanyl  he can buy off the street. Reported negative intrusive thoughts started yesterday (11/05/2023) and worsen overnight with progressive suicidal thought Patient is admitted to adult psych unit with Q15 min safety monitoring. Multidisciplinary team approach is offered. Medication management; group/milieu therapy is offered.    Subjective:  Chart reviewed, case discussed in multidisciplinary meeting, patient seen during rounds.  Today on interview patient is noted to be resting in bed.  He reports that he is having a better day since yesterday.  He denies any thoughts of suicide or homicide at this time and denies any intent or plan.  He did acknowledge that afternoons he was having episodes of depression and suicidal ideation but denies having any such thoughts since yesterday.  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  He reports that he is working with the social work team in finding placement for him as he did get evicted from his current living situation.  During discussion with the treatment team social work has reached out to sober living facilities and waiting for confirmation.  Given his significant history of multiple suicide attempts, multiple psychiatric hospitalizations, significant trauma history, being homeless with no social support patient remains chronic moderate risk to be discharged into the community without a safe discharge plan.   Sleep: Good  Appetite:  Good  Past Psychiatric History: see h&P Family History:  Family History  Problem Relation Age of Onset   Arthritis Mother    Stroke Father 3       heavy smoker 4-5 packs a day unfiltered   Schizophrenia Father        homeless and did not get medical care in  time   Alcoholism Father    Colon cancer Neg Hx    Esophageal cancer Neg Hx    Rectal cancer Neg Hx    Stomach cancer Neg Hx    Colon polyps Neg Hx    Social History:  Social History   Substance and Sexual Activity  Alcohol Use Yes   Alcohol/week: 5.0 standard drinks of alcohol   Types: 2 Glasses of wine, 3 Cans of beer per week   Comment: SOCIAL 3 TIMES PER WEEK 2 DRINKS AT A TIME     Social History   Substance and Sexual Activity  Drug Use Not Currently   Types: Marijuana   Comment: HX OF MONTHS AGO    Social History   Socioeconomic History   Marital status: Single    Spouse name: Not on file   Number of children: Not on file   Years of education: Not on file   Highest education level: Bachelor's degree (e.g., BA, AB, BS)  Occupational History   Not on file  Tobacco Use   Smoking status: Former    Current packs/day: 0.00    Average packs/day: 0.5 packs/day for 5.0 years (2.5 ttl pk-yrs)    Types: Cigarettes    Start date: 05/20/1983    Quit date: 05/19/1988    Years since quitting: 35.5   Smokeless tobacco: Never   Tobacco comments:    quit in 1990  Vaping Use   Vaping status: Never Used  Substance and Sexual Activity   Alcohol use: Yes    Alcohol/week: 5.0  standard drinks of alcohol    Types: 2 Glasses of wine, 3 Cans of beer per week    Comment: SOCIAL 3 TIMES PER WEEK 2 DRINKS AT A TIME   Drug use: Not Currently    Types: Marijuana    Comment: HX OF MONTHS AGO   Sexual activity: Never  Other Topics Concern   Not on file  Social History Narrative   Single. 1 dog, 2 cats. Lives alone.       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 Enjoys dinner parties.    Social Drivers of Health   Financial Resource Strain:  High Risk (08/09/2023)   Overall Financial Resource Strain (CARDIA)    Difficulty of Paying Living Expenses: Hard  Food Insecurity: Food Insecurity Present (11/07/2023)   Hunger Vital Sign    Worried About Running Out of Food in the Last Year: Sometimes true    Ran Out of Food in the Last Year: Sometimes true  Transportation Needs: No Transportation Needs (11/07/2023)   PRAPARE - Administrator, Civil Service (Medical): No    Lack of Transportation (Non-Medical): No  Recent Concern: Transportation Needs - Unmet Transportation Needs (08/09/2023)   PRAPARE - Transportation    Lack of Transportation (Medical): Yes    Lack of Transportation (Non-Medical): Yes  Physical Activity: Sufficiently Active (08/09/2023)   Exercise Vital Sign    Days of Exercise per Week: 7 days    Minutes of Exercise per Session: 30 min  Stress: Stress Concern Present (08/09/2023)   [employer] of Occupational Health - Occupational Stress Questionnaire    Feeling of Stress : To some extent  Social Connections: Moderately Integrated (08/09/2023)   Social Connection and Isolation Panel    Frequency of Communication with Friends and Family: More than three times a week    Frequency of Social Gatherings with Friends and Family: Once a week    Attends Religious Services: More than 4 times per year    Active Member of Clubs or Organizations: Yes    Attends Engineer, structural: More than 4 times per year    Marital Status: Never married   Past Medical History:  Past Medical History:  Diagnosis Date   Cancer (HCC)    melanoma;  right ear   Depression    Diabetes mellitus    GERD (gastroesophageal reflux disease)    Hyperlipidemia    Hypertension    Metabolic syndrome    Obesity    PONV (postoperative nausea and vomiting)    YRS AGO WITHER ETHER    Past Surgical History:  Procedure Laterality Date   COLONOSCOPY  05/01/2013   CYST REMOVAL TRUNK  1989   benign   MELANOMA EXCISION  2008    right ear and neck   ORIF HUMERUS FRACTURE Left 12/23/2020   Procedure: OPEN REDUCTION INTERNAL FIXATION (ORIF) PROXIMAL HUMERUS FRACTURE;  Surgeon: [name], MD;  Location: [facility];  Service: Orthopedics;  Laterality: Left;    POLYPECTOMY     reconstructive plastic surgery face  1967   dog bite at age 53; about 15 surgeries for 10 years   TONSILLECTOMY  1966   age 14    Current Medications: Current Facility-Administered Medications  Medication Dose Route Frequency Provider Last Rate Last Admin   acetaminophen  (TYLENOL ) tablet 650 mg  650 mg Oral Q6H PRN Ajibola, Ene A, NP   650 mg at 11/14/23 0430   alum & mag hydroxide-simeth (MAALOX/MYLANTA) 200-200-20 MG/5ML suspension 30 mL  30 mL Oral Q4H PRN Ajibola, Ene A, NP       ARIPiprazole  (ABILIFY ) tablet 10 mg  10 mg Oral QHS Ryli Standlee, MD   10 mg at 11/13/23 2113   ARIPiprazole  (ABILIFY ) tablet 5 mg  5 mg Oral Daily Eleanor Dimichele, MD   5 mg at 11/14/23 9192   atorvastatin  (LIPITOR) tablet 40 mg  40 mg Oral Daily Ajibola, Ene A, NP   40 mg at 11/14/23 9192   glipiZIDE  (GLUCOTROL  XL) 24 hr tablet 5 mg  5 mg Oral Daily Ajibola, Ene A, NP   5 mg at 11/14/23 9192   hydrOXYzine  (ATARAX ) tablet 25 mg  25 mg Oral TID PRN Ajibola, Ene A, NP   25 mg at 11/13/23 2113   insulin  aspart (novoLOG ) injection 0-15 Units  0-15 Units Subcutaneous TID WC Ajibola, Ene A, NP   5 Units at 11/14/23 1236   insulin  aspart (novoLOG ) injection 0-5 Units  0-5 Units Subcutaneous QHS Ajibola, Ene A, NP   2 Units at 11/13/23 2114   lisinopril  (ZESTRIL ) tablet 40 mg  40 mg Oral Daily Ajibola, Ene A, NP   40 mg at 11/14/23 0806   magnesium  hydroxide (MILK OF MAGNESIA) suspension 30 mL  30 mL Oral Daily PRN Ajibola, Ene A, NP       metFORMIN  (GLUCOPHAGE ) tablet 1,000 mg  1,000 mg Oral BID WC Ajibola, Ene A, NP   1,000 mg at 11/14/23 0806   pantoprazole  (PROTONIX ) EC tablet 40 mg  40 mg Oral Daily Ajibola, Ene A, NP   40 mg at 11/14/23 0807   sertraline   (ZOLOFT ) tablet 150 mg  150 mg Oral Daily Ajibola, Ene A, NP   150 mg at 11/14/23 9193   traZODone  (DESYREL ) tablet 50 mg  50 mg Oral QHS PRN Ajibola, Ene A, NP   50 mg at 11/12/23 2139    Lab Results:  Results for orders placed or performed during the hospital encounter of 11/06/23 (from the past 48 hours)  Glucose, capillary     Status: Abnormal   Collection Time: 11/12/23  4:34 PM  Result Value Ref Range   Glucose-Capillary 192 (H) 70 - 99 mg/dL    Comment: Glucose reference range applies only to samples taken after fasting for at least 8 hours.  Glucose, capillary     Status: Abnormal   Collection Time: 11/12/23  9:33 PM  Result Value Ref Range   Glucose-Capillary 170 (H) 70 - 99 mg/dL    Comment: Glucose reference range applies only to samples taken after fasting for at least 8 hours.  Glucose, capillary     Status: Abnormal   Collection Time: 11/13/23  7:00 AM  Result Value Ref Range   Glucose-Capillary 188 (H) 70 - 99 mg/dL    Comment: Glucose reference range applies only to samples taken after fasting for at least 8 hours.  Glucose, capillary     Status: Abnormal   Collection Time: 11/13/23 11:49 AM  Result Value Ref Range   Glucose-Capillary 256 (H) 70 - 99 mg/dL    Comment: Glucose reference range applies only to samples taken after fasting for at least 8 hours.  Glucose, capillary     Status: Abnormal  Collection Time: 11/13/23  4:11 PM  Result Value Ref Range   Glucose-Capillary 171 (H) 70 - 99 mg/dL    Comment: Glucose reference range applies only to samples taken after fasting for at least 8 hours.  Glucose, capillary     Status: Abnormal   Collection Time: 11/13/23  7:52 PM  Result Value Ref Range   Glucose-Capillary 234 (H) 70 - 99 mg/dL    Comment: Glucose reference range applies only to samples taken after fasting for at least 8 hours.  Glucose, capillary     Status: Abnormal   Collection Time: 11/14/23  6:14 AM  Result Value Ref Range   Glucose-Capillary 170  (H) 70 - 99 mg/dL    Comment: Glucose reference range applies only to samples taken after fasting for at least 8 hours.  Glucose, capillary     Status: Abnormal   Collection Time: 11/14/23 11:44 AM  Result Value Ref Range   Glucose-Capillary 224 (H) 70 - 99 mg/dL    Comment: Glucose reference range applies only to samples taken after fasting for at least 8 hours.  Glucose, capillary     Status: Abnormal   Collection Time: 11/14/23  4:10 PM  Result Value Ref Range   Glucose-Capillary 242 (H) 70 - 99 mg/dL    Comment: Glucose reference range applies only to samples taken after fasting for at least 8 hours.    Blood Alcohol level:  Lab Results  Component Value Date   [facility] <15 11/06/2023   ETH <15 08/25/2023    Metabolic Disorder Labs: Lab Results  Component Value Date   HGBA1C 10.6 (H) 11/06/2023   MPG 258 11/06/2023   MPG 223.08 08/25/2023   No results found for: PROLACTIN Lab Results  Component Value Date   CHOL 279 (H) 11/06/2023   TRIG 282 (H) 11/06/2023   HDL 60 11/06/2023   CHOLHDL 4.7 11/06/2023   VLDL 56 (H) 11/06/2023   LDLCALC 163 (H) 11/06/2023   LDLCALC 57 08/25/2023    Physical Findings: AIMS:  , ,  ,  ,    CIWA:    COWS:      Psychiatric Specialty Exam:  Presentation  General Appearance:  Appropriate for Environment  Eye Contact: Good  Speech: Normal Rate  Speech Volume: Normal    Mood and Affect  Mood: Euthymic  Affect: Appropriate   Thought Process  Thought Processes: Coherent; Linear  Descriptions of Associations:Intact  Orientation:Full (Time, Place and Person)  Thought Content:WDL  Hallucinations: Denies  Ideas of Reference:None  Suicidal Thoughts: Denies  Homicidal Thoughts: Denies   Sensorium  Memory: Immediate Good; Recent Good; Remote Good  Judgment: Good  Insight: Good   Executive Functions  Concentration: Good  Attention Span: Good  Recall: Good  Fund of  Knowledge: Good  Language: Good   Psychomotor Activity  Psychomotor Activity: No data recorded  Musculoskeletal: Strength & Muscle Tone: within normal limits Gait & Station: normal Assets  Assets: Manufacturing systems engineer; Desire for Improvement    Physical Exam: Physical Exam Vitals and nursing note reviewed.    ROS Blood pressure (!) 142/86, pulse 73, temperature (!) 97.2 F (36.2 C), resp. rate 20, height 5' 8 (1.727 m), weight 108.4 kg, SpO2 96%. Body mass index is 36.34 kg/m.  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 Psychiatric Diagnoses and Treatment:  Continue abilify  10 mg at bedtime and 5 mg daily Continue sertraline  150 mg daily      3. Medical Issues Being Addressed:     4. Discharge Planning: Working on getting him ACT team and safe discharge planning to sober living facilities if possible  -- Social work and case management to assist with discharge planning and identification of hospital follow-up needs prior to discharge  -- Estimated LOS: TBD  Allyn Foil, MD 11/14/2023, 4:17 PM

## 2023-11-14 NOTE — Group Note (Signed)
 Date:  11/14/2023 Time:  10:24 AM  Group Topic/Focus:  Goals Group:   The focus of this group is to help patients establish daily goals to achieve during treatment and discuss how the patient can incorporate goal setting into their daily lives to aide in recovery.    Participation Level:  Active  Participation Quality:  Appropriate  Affect:  Appropriate  Cognitive:  Alert  Insight: Appropriate  Engagement in Group:  Engaged  Modes of Intervention:  Activity, Discussion, and Education  Additional Comments:    Skippy LITTIE Bennett 11/14/2023, 10:24 AM

## 2023-11-14 NOTE — Plan of Care (Signed)

## 2023-11-14 NOTE — Plan of Care (Signed)
   Problem: Education: Goal: Knowledge of [place] [facility] Education information/materials will improve Outcome: Progressing Goal: Emotional status will improve Outcome: Progressing Goal: Mental status will improve Outcome: Progressing

## 2023-11-15 LAB — GLUCOSE, CAPILLARY
Glucose-Capillary: 222 mg/dL — ABNORMAL HIGH (ref 70–99)
Glucose-Capillary: 177 mg/dL — ABNORMAL HIGH (ref 70–99)
Glucose-Capillary: 122 mg/dL — ABNORMAL HIGH (ref 70–99)
Glucose-Capillary: 217 mg/dL — ABNORMAL HIGH (ref 70–99)

## 2023-11-15 MED ORDER — GLIPIZIDE ER 5 MG PO TB24
5.0000 mg | ORAL_TABLET | Freq: Two times a day (BID) | ORAL | Status: DC
  Administered 2023-11-15 – 2023-11-17 (×4): 5 mg via ORAL
  Filled 2023-11-15 (×4): qty 1

## 2023-11-15 NOTE — BHH Counselor (Signed)
 CSW touched base with patient to engage in safe discharge planning. Patient reported that he has agreed to go to [facility] but that he can no attend until September 10th when he receives his next check.   CSW touched base with Interior and spatial designer of [facility], Mr. Tee who reported that he is willing to take a payment from the patient for his room and board until the 10th to allow for the patient to move in before his next check.   Patient agreed to this and touched base with director to coordinate. CSW to assess.   Conal Shetley, MSW, LCSWA 11/15/2023 11:25 AM

## 2023-11-15 NOTE — Group Note (Signed)
 Date:  11/15/2023 Time:  2:25 PM  Group Topic/Focus:  Building Self Esteem:   The Focus of this group is helping patients become aware of the effects of self-esteem on their lives, the things they and others do that enhance or undermine their self-esteem, seeing the relationship between their level of self-esteem and the choices they make and learning ways to enhance self-esteem. Goals Group:   The focus of this group is to help patients establish daily goals to achieve during treatment and discuss how the patient can incorporate goal setting into their daily lives to aide in recovery. Self Care:   The focus of this group is to help patients understand the importance of self-care in order to improve or restore emotional, physical, spiritual, interpersonal, and financial health.    Participation Level:  Active  Participation Quality:  Appropriate and Attentive  Affect:  Appropriate  Cognitive:  Alert, Appropriate, and Oriented  Insight: Appropriate and Good  Engagement in Group:  Engaged and Supportive  Modes of Intervention:  Discussion and Support  Additional Comments:  N/A  Butler LITTIE Gelineau 11/15/2023, 2:25 PM

## 2023-11-15 NOTE — Group Note (Signed)
 LCSW Group Therapy Note   Group Date: 11/15/2023 Start Time: 1250 End Time: 1350   Type of Therapy and Topic:  Group Therapy: Challenging Core Beliefs  Participation Level:  Active  Description of Group:  Patients were educated about core beliefs and asked to identify one harmful core belief that they have. Patients were asked to explore from where those beliefs originate. Patients were asked to discuss how those beliefs make them feel and the resulting behaviors of those beliefs. They were then be asked if those beliefs are true and, if so, what evidence they have to support them. Lastly, group members were challenged to replace those negative core beliefs with helpful beliefs.   Therapeutic Goals:   1. Patient will identify harmful core beliefs and explore the origins of such beliefs. 2. Patient will identify feelings and behaviors that result from those core beliefs. 3. Patient will discuss whether such beliefs are true. 4.  Patient will replace harmful core beliefs with helpful ones.  Summary of Patient Progress:  Patient actively engaged in processing and exploring how core beliefs are formed and how they impact thoughts, feelings, and behaviors. Patient proved open to input from peers and feedback from CSW. Patient demonstrated proficient insight into the subject matter, was respectful and supportive of peers, and participated throughout the entire session.  Therapeutic Modalities: Cognitive Behavioral Therapy; Solution-Focused Therapy   Pharrah Rottman M Naturi Alarid, [place] 11/15/2023  1:53 PM

## 2023-11-15 NOTE — Group Note (Signed)
 Recreation Therapy Group Note   Group Topic:Animal Assisted Therapy   Group Date: 11/15/2023 Start Time: 1000 End Time: 1035 Facilitators: Celestia Jeoffrey BRAVO, LRT, CTRS Location: Dayroom  Group Description: AAA. Animal-Assisted Activity provides opportunities for motivational, educational, therapeutic and/or recreational benefits to enhance quality of life. [name] and [name] visited the unit to interact with patients.    Goal Areas Addressed:  Reduced anxiety and stress Improved mood Increased social interaction Enhanced communication skills Reduced loneliness and isolation Improved emotional regulation    Affect/Mood: Appropriate   Participation Level: Active    Clinical Observations/Individualized Feedback: [name] interacted well with LRT and peers duration of session.   Plan: Continue to engage patient in RT group sessions 2-3x/week.   Jeoffrey BRAVO Celestia, LRT, CTRS 11/15/2023 1:15 PM

## 2023-11-15 NOTE — Plan of Care (Signed)
   Problem: Education: Goal: Emotional status will improve Outcome: Progressing Goal: Mental status will improve Outcome: Progressing Goal: Verbalization of understanding the information provided will improve Outcome: Progressing

## 2023-11-15 NOTE — Progress Notes (Signed)
 [facility] MD Progress Note  11/15/2023 10:36 PM MEAD SLANE  MRN:  [id number]  63-year-old Yashar '[name]' Salmons presents to [facility] via [facility] for suicidal ideations with a plan to overdose on fentanyl  he can buy off the street. Reported negative intrusive thoughts started yesterday (11/05/2023) and worsen overnight with progressive suicidal thought Patient is admitted to adult psych unit with Q15 min safety monitoring. Multidisciplinary team approach is offered. Medication management; group/milieu therapy is offered.    Subjective:  Chart reviewed, case discussed in multidisciplinary meeting, patient seen during rounds.  Today on interview patient is noted to be walking in the hallway.  She talked to the provider extensively about the stories he has returned from his personal experience and from other stories.  He was grateful and thankful about the staff helping him out.  Per social work team patient has a placement that he can go to.  Patient denies SI/HI/plan and denies hallucinations.  Patient is taking his medications with no reported side effects. Sleep: Good  Appetite:  Good  Past Psychiatric History: see h&P Family History:  Family History  Problem Relation Age of Onset   Arthritis Mother    Stroke Father 16       heavy smoker 4-5 packs a day unfiltered   Schizophrenia Father        homeless and did not get medical care in time   Alcoholism Father    Colon cancer Neg Hx    Esophageal cancer Neg Hx    Rectal cancer Neg Hx    Stomach cancer Neg Hx    Colon polyps Neg Hx    Social History:  Social History   Substance and Sexual Activity  Alcohol Use Yes   Alcohol/week: 5.0 standard drinks of alcohol   Types: 2 Glasses of wine, 3 Cans of beer per week   Comment: SOCIAL 3 TIMES PER WEEK 2 DRINKS AT A TIME     Social History   Substance and Sexual Activity  Drug Use Not Currently   Types: Marijuana   Comment: HX OF MONTHS AGO    Social History   Socioeconomic History   Marital  status: Single    Spouse name: Not on file   Number of children: Not on file   Years of education: Not on file   Highest education level: Bachelor's degree (e.g., BA, AB, BS)  Occupational History   Not on file  Tobacco Use   Smoking status: Former    Current packs/day: 0.00    Average packs/day: 0.5 packs/day for 5.0 years (2.5 ttl pk-yrs)    Types: Cigarettes    Start date: 05/20/1983    Quit date: 05/19/1988    Years since quitting: 35.5   Smokeless tobacco: Never   Tobacco comments:    quit in 1990  Vaping Use   Vaping status: Never Used  Substance and Sexual Activity   Alcohol use: Yes    Alcohol/week: 5.0 standard drinks of alcohol    Types: 2 Glasses of wine, 3 Cans of beer per week    Comment: SOCIAL 3 TIMES PER WEEK 2 DRINKS AT A TIME   Drug use: Not Currently    Types: Marijuana    Comment: HX OF MONTHS AGO   Sexual activity: Never  Other Topics Concern   Not on file  Social History Narrative   Single. 1 dog, 2 cats. Lives alone.       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 Enjoys dinner parties.    Social Drivers of Health   Financial Resource Strain: High Risk (08/09/2023)   Overall Financial Resource Strain (CARDIA)    Difficulty of Paying Living Expenses: Hard  Food Insecurity: Food Insecurity Present (11/07/2023)   Hunger Vital Sign    Worried About Running Out of Food in the Last Year: Sometimes true    Ran Out of Food in the Last Year: Sometimes true  Transportation Needs: No Transportation Needs (11/07/2023)   PRAPARE - Administrator, Civil Service (Medical): No    Lack of Transportation (Non-Medical): No  Recent Concern: Transportation Needs - Unmet Transportation Needs (08/09/2023)   PRAPARE -  Transportation    Lack of Transportation (Medical): Yes    Lack of Transportation (Non-Medical): Yes  Physical Activity: Sufficiently Active (08/09/2023)   Exercise Vital Sign    Days of Exercise per Week: 7 days    Minutes of Exercise per Session: 30 min  Stress: Stress Concern Present (08/09/2023)   [employer] of Occupational Health - Occupational Stress Questionnaire    Feeling of Stress : To some extent  Social Connections: Moderately Integrated (08/09/2023)   Social Connection and Isolation Panel    Frequency of Communication with Friends and Family: More than three times a week    Frequency of Social Gatherings with Friends and Family: Once a week    Attends Religious Services: More than 4 times per year    Active Member of Clubs or Organizations: Yes    Attends Engineer, structural: More than 4 times per year    Marital Status: Never married   Past Medical History:  Past Medical History:  Diagnosis Date   Cancer (HCC)    melanoma;  right ear   Depression    Diabetes mellitus    GERD (gastroesophageal reflux disease)    Hyperlipidemia    Hypertension    Metabolic syndrome    Obesity    PONV (postoperative nausea and vomiting)    YRS AGO WITHER ETHER    Past Surgical History:  Procedure Laterality Date   COLONOSCOPY  05/01/2013   CYST REMOVAL TRUNK  1989   benign   MELANOMA EXCISION  2008   right ear and neck   ORIF HUMERUS FRACTURE Left 12/23/2020   Procedure: OPEN REDUCTION INTERNAL FIXATION (ORIF) PROXIMAL HUMERUS FRACTURE;  Surgeon: [name], MD;  Location: [facility];  Service: Orthopedics;  Laterality: Left;    POLYPECTOMY     reconstructive plastic surgery face  1967   dog bite at age 60; about 72 surgeries for 10 years   TONSILLECTOMY  1966   age 37    Current Medications: Current Facility-Administered Medications  Medication Dose Route Frequency Provider Last Rate Last Admin   acetaminophen  (TYLENOL ) tablet 650 mg  650 mg Oral Q6H  PRN Ajibola, Ene A, NP   650 mg at 11/15/23 0654   alum & mag hydroxide-simeth (MAALOX/MYLANTA) 200-200-20 MG/5ML suspension 30 mL  30 mL Oral Q4H PRN Ajibola, Ene A, NP       ARIPiprazole  (ABILIFY ) tablet 10 mg  10 mg Oral QHS Max Nuno, MD   10 mg at 11/15/23 2106   ARIPiprazole  (ABILIFY ) tablet 5 mg  5 mg Oral Daily Haru Shaff,  Maliah Pyles, MD   5 mg at 11/15/23 0825   atorvastatin  (LIPITOR) tablet 40 mg  40 mg Oral Daily Ajibola, Ene A, NP   40 mg at 11/15/23 0825   glipiZIDE  (GLUCOTROL  XL) 24 hr tablet 5 mg  5 mg Oral BID AC Lelon Ikard, MD   5 mg at 11/15/23 1721   hydrOXYzine  (ATARAX ) tablet 25 mg  25 mg Oral TID PRN Ajibola, Ene A, NP   25 mg at 11/13/23 2113   insulin  aspart (novoLOG ) injection 0-15 Units  0-15 Units Subcutaneous TID WC Ajibola, Ene A, NP   2 Units at 11/15/23 1721   insulin  aspart (novoLOG ) injection 0-5 Units  0-5 Units Subcutaneous QHS Ajibola, Ene A, NP   2 Units at 11/15/23 2105   lisinopril  (ZESTRIL ) tablet 40 mg  40 mg Oral Daily Ajibola, Ene A, NP   40 mg at 11/15/23 9175   magnesium  hydroxide (MILK OF MAGNESIA) suspension 30 mL  30 mL Oral Daily PRN Ajibola, Ene A, NP       metFORMIN  (GLUCOPHAGE ) tablet 1,000 mg  1,000 mg Oral BID WC Ajibola, Ene A, NP   1,000 mg at 11/15/23 1721   pantoprazole  (PROTONIX ) EC tablet 40 mg  40 mg Oral Daily Ajibola, Ene A, NP   40 mg at 11/15/23 0825   sertraline  (ZOLOFT ) tablet 150 mg  150 mg Oral Daily Ajibola, Ene A, NP   150 mg at 11/15/23 0825   traZODone  (DESYREL ) tablet 50 mg  50 mg Oral QHS PRN Ajibola, Ene A, NP   50 mg at 11/15/23 2106    Lab Results:  Results for orders placed or performed during the hospital encounter of 11/06/23 (from the past 48 hours)  Glucose, capillary     Status: Abnormal   Collection Time: 11/14/23  6:14 AM  Result Value Ref Range   Glucose-Capillary 170 (H) 70 - 99 mg/dL    Comment: Glucose reference range applies only to samples taken after fasting for at least 8 hours.  Glucose,  capillary     Status: Abnormal   Collection Time: 11/14/23 11:44 AM  Result Value Ref Range   Glucose-Capillary 224 (H) 70 - 99 mg/dL    Comment: Glucose reference range applies only to samples taken after fasting for at least 8 hours.  Glucose, capillary     Status: Abnormal   Collection Time: 11/14/23  4:10 PM  Result Value Ref Range   Glucose-Capillary 242 (H) 70 - 99 mg/dL    Comment: Glucose reference range applies only to samples taken after fasting for at least 8 hours.  Glucose, capillary     Status: Abnormal   Collection Time: 11/14/23  7:48 PM  Result Value Ref Range   Glucose-Capillary 209 (H) 70 - 99 mg/dL    Comment: Glucose reference range applies only to samples taken after fasting for at least 8 hours.  Glucose, capillary     Status: Abnormal   Collection Time: 11/15/23  7:47 AM  Result Value Ref Range   Glucose-Capillary 222 (H) 70 - 99 mg/dL    Comment: Glucose reference range applies only to samples taken after fasting for at least 8 hours.  Glucose, capillary     Status: Abnormal   Collection Time: 11/15/23 11:44 AM  Result Value Ref Range   Glucose-Capillary 177 (H) 70 - 99 mg/dL    Comment: Glucose reference range applies only to samples taken after fasting for at least 8 hours.  Glucose, capillary  Status: Abnormal   Collection Time: 11/15/23  4:29 PM  Result Value Ref Range   Glucose-Capillary 122 (H) 70 - 99 mg/dL    Comment: Glucose reference range applies only to samples taken after fasting for at least 8 hours.  Glucose, capillary     Status: Abnormal   Collection Time: 11/15/23  7:56 PM  Result Value Ref Range   Glucose-Capillary 217 (H) 70 - 99 mg/dL    Comment: Glucose reference range applies only to samples taken after fasting for at least 8 hours.   Comment 1 Notify RN     Blood Alcohol level:  Lab Results  Component Value Date   [facility] <15 11/06/2023   ETH <15 08/25/2023    Metabolic Disorder Labs: Lab Results  Component Value Date    HGBA1C 10.6 (H) 11/06/2023   MPG 258 11/06/2023   MPG 223.08 08/25/2023   No results found for: PROLACTIN Lab Results  Component Value Date   CHOL 279 (H) 11/06/2023   TRIG 282 (H) 11/06/2023   HDL 60 11/06/2023   CHOLHDL 4.7 11/06/2023   VLDL 56 (H) 11/06/2023   LDLCALC 163 (H) 11/06/2023   LDLCALC 57 08/25/2023    Physical Findings: AIMS:  , ,  ,  ,    CIWA:    COWS:      Psychiatric Specialty Exam:  Presentation  General Appearance:  Appropriate for Environment  Eye Contact: Good  Speech: Normal Rate  Speech Volume: Normal    Mood and Affect  Mood: Euthymic  Affect: Appropriate   Thought Process  Thought Processes: Coherent; Linear  Descriptions of Associations:Intact  Orientation:Full (Time, Place and Person)  Thought Content:WDL  Hallucinations: Denies  Ideas of Reference:None  Suicidal Thoughts: Denies  Homicidal Thoughts: Denies   Sensorium  Memory: Immediate Good; Recent Good; Remote Good  Judgment: Good  Insight: Good   Executive Functions  Concentration: Good  Attention Span: Good  Recall: Good  Fund of Knowledge: Good  Language: Good   Psychomotor Activity  Psychomotor Activity: No data recorded  Musculoskeletal: Strength & Muscle Tone: within normal limits Gait & Station: normal Assets  Assets: Manufacturing systems engineer; Desire for Improvement    Physical Exam: Physical Exam Vitals and nursing note reviewed.    ROS Blood pressure (!) 158/79, pulse 69, temperature (!) 97.1 F (36.2 C), resp. rate 20, height 5' 8 (1.727 m), weight 108.4 kg, SpO2 100%. Body mass index is 36.34 kg/m.  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 Psychiatric Diagnoses and Treatment:  Continue abilify  10 mg at bedtime and 5 mg daily Continue sertraline  150 mg daily      3. Medical Issues Being Addressed:     4. Discharge Planning: Working on getting him ACT team and safe discharge planning to sober living facilities if possible  -- Social work and case management to assist with discharge planning and identification of hospital follow-up needs prior to discharge  -- Estimated LOS: TBD  Allyn Foil, MD 11/15/2023,  10:36 PM

## 2023-11-15 NOTE — Progress Notes (Signed)
   11/14/23 2000  Psych Admission Type (Psych Patients Only)  Admission Status Voluntary  Psychosocial Assessment  Patient Complaints None  Eye Contact Fair  Facial Expression Flat  Affect Flat  Speech Logical/coherent  Interaction Attention-seeking  Motor Activity Slow  Appearance/Hygiene Improved  Behavior Characteristics Cooperative;Appropriate to situation  Mood Pleasant  Aggressive Behavior  Effect No apparent injury  Thought Process  Coherency WDL  Content WDL  Delusions None reported or observed  Perception WDL  Hallucination None reported or observed  Judgment Poor  Confusion None  Danger to Self  Current suicidal ideation? Denies  Agreement Not to Harm Self Yes  Description of Agreement Verbal  Danger to Others  Danger to Others None reported or observed

## 2023-11-15 NOTE — Plan of Care (Signed)
   Problem: Education: Goal: Emotional status will improve Outcome: Progressing

## 2023-11-15 NOTE — Group Note (Signed)
 Date:  11/15/2023 Time:  8:26 PM  Group Topic/Focus:  Orientation:   The focus of this group is to educate the patient on the purpose and policies of crisis stabilization and provide a format to answer questions about their admission.  The group details unit policies and expectations of patients while admitted.    Participation Level:  Active  Participation Quality:  Appropriate, Attentive, and Sharing  Affect:  Appropriate  Cognitive:  Alert and Appropriate  Insight: Appropriate and Good  Engagement in Group:  Engaged and Improving  Modes of Intervention:  Clarification, Discussion, Education, Orientation, Rapport Building, and Support  Additional Comments:     Lyberti Thrush 11/15/2023, 8:26 PM

## 2023-11-15 NOTE — Progress Notes (Signed)
 Tour of Duty:  Eleanor KATHEE Flemings, RN, 11/15/23, Tour of Duty: 0700-1900  SI/HI/AVH: Denies  Self-Reported   Mood: Positive  Anxiety: Denies Depression: Denies Irritability: Denies  Broset  Violence Prevention Guidelines *See Row Information*: Small Violence Risk interventions implemented   LBM  Last BM Date : 11/15/23   Pain: present, PRN provided (see MAR)  Patient Refusals (including Rx): No  Shift Summary:  Patient observed to be calm on unit. Patient able to make needs known. Patient observed to engage appropriately with staff and peers. Patient taking medications as prescribed. This shift, no PRN medication requested or required. No observed or reported side effects to medication. No observed or reported agitation, aggression, or other acute emotional distress. No observed or reported physical abnormalities or concerns. Attended group   Last Vitals  Vitals Weight: 108.4 kg Temp: (!) 97.1 F (36.2 C) Temp Source: Oral Pulse Rate: 69 Resp: 20 BP: (!) 158/79 Patient Position: (not recorded)  Admission Type  Psych Admission Type (Psych Patients Only) Admission Status: Voluntary Date 72 hour document signed : (not recorded) Time 72 hour document signed : (not recorded) Provider Notified (First and Last Name) (see details for LINK to note): (not recorded)   Psychosocial Assessment  Psychosocial Assessment Patient Complaints: None Eye Contact: Fair Facial Expression: Other (Comment) (WNL) Affect: Anxious Speech: Logical/coherent Interaction: Assertive Motor Activity: Slow Appearance/Hygiene: In scrubs Behavior Characteristics: Cooperative Mood: Pleasant   Aggressive Behavior  Targets: (not recorded)   Thought Process  Thought Process Coherency: Within Defined Limits Content: Within Defined Limits Delusions: None reported or observed Perception: Within Defined Limits Hallucination: None reported or observed Judgment: Impaired Confusion:  None  Danger to Self/Others  Danger to Self Current suicidal ideation?: Denies Description of Suicide Plan: (not recorded) Self-Injurious Behavior: (not recorded) Agreement Not to Harm Self: Yes Description of Agreement: verbal Danger to Others: (not recorded)

## 2023-11-16 LAB — GLUCOSE, CAPILLARY
Glucose-Capillary: 187 mg/dL — ABNORMAL HIGH (ref 70–99)
Glucose-Capillary: 242 mg/dL — ABNORMAL HIGH (ref 70–99)
Glucose-Capillary: 215 mg/dL — ABNORMAL HIGH (ref 70–99)
Glucose-Capillary: 157 mg/dL — ABNORMAL HIGH (ref 70–99)

## 2023-11-16 MED ORDER — INSULIN ASPART 100 UNIT/ML IJ SOLN
0.0000 [IU] | Freq: Every day | INTRAMUSCULAR | 11 refills | Status: DC
  Filled 2023-11-16: qty 10, 200d supply, fill #0

## 2023-11-16 MED ORDER — GLIPIZIDE ER 5 MG PO TB24
5.0000 mg | ORAL_TABLET | Freq: Two times a day (BID) | ORAL | 0 refills | Status: DC
  Filled 2023-11-16: qty 60, 30d supply, fill #0

## 2023-11-16 MED ORDER — ARIPIPRAZOLE 5 MG PO TABS
5.0000 mg | ORAL_TABLET | Freq: Every day | ORAL | 0 refills | Status: DC
  Filled 2023-11-16: qty 30, 30d supply, fill #0

## 2023-11-16 MED ORDER — INSULIN LISPRO (1 UNIT DIAL) 100 UNIT/ML (KWIKPEN)
PEN_INJECTOR | SUBCUTANEOUS | 11 refills | Status: DC
  Filled 2023-11-16: qty 15, 30d supply, fill #0

## 2023-11-16 MED ORDER — SERTRALINE HCL 50 MG PO TABS
150.0000 mg | ORAL_TABLET | Freq: Every day | ORAL | 0 refills | Status: DC
  Filled 2023-11-16: qty 90, 30d supply, fill #0

## 2023-11-16 MED ORDER — ARIPIPRAZOLE 10 MG PO TABS
10.0000 mg | ORAL_TABLET | Freq: Every day | ORAL | 0 refills | Status: DC
  Filled 2023-11-16: qty 30, 30d supply, fill #0

## 2023-11-16 NOTE — Plan of Care (Signed)
   Problem: Education: Goal: Knowledge of [facility] Education information/materials will improve Outcome: Progressing Goal: Emotional status will improve Outcome: Progressing Goal: Mental status will improve Outcome: Progressing Goal: Verbalization of understanding the information provided will improve Outcome: Progressing

## 2023-11-16 NOTE — Plan of Care (Signed)
   Problem: Education: Goal: Emotional status will improve Outcome: Progressing Goal: Mental status will improve Outcome: Progressing Goal: Verbalization of understanding the information provided will improve Outcome: Progressing

## 2023-11-16 NOTE — Progress Notes (Signed)
 [facility] MD Progress Note  11/16/2023 10:47 PM DAMOND BORCHERS  MRN:  [id number]  -year-old [name] presents to [facility] via [facility] for suicidal ideations with a plan to overdose on fentanyl  he can buy off the street. Reported negative intrusive thoughts started yesterday (11/05/2023) and worsen overnight with progressive suicidal thought Patient is admitted to adult psych unit with Q15 min safety monitoring. Multidisciplinary team approach is offered. Medication management; group/milieu therapy is offered.    Subjective:  Chart reviewed, case discussed in multidisciplinary meeting, patient seen during rounds.  Patient is noted to be participating in groups.  He appreciated all the help he received from the treatment team including staff members.  He reports that his depression and anxiety improved a lot and denies SI/HI/plan and denies auditory/visual hallucinations.  He reports taking his medications and denying having any side effects.  Patient is discharged to the placement tomorrow as he has bed availability on 11/17/2023 and to ensure a safe discharge planning     Sleep: Good  Appetite:  Good  Past Psychiatric History: see h&P Family History:  Family History  Problem Relation Age of Onset   Arthritis Mother    Stroke Father 74       heavy smoker 4-5 packs a day unfiltered   Schizophrenia Father        homeless and did not get medical care in time   Alcoholism Father    Colon cancer Neg Hx    Esophageal cancer Neg Hx    Rectal cancer Neg Hx    Stomach cancer Neg Hx    Colon polyps Neg Hx    Social History:  Social History   Substance and Sexual Activity  Alcohol Use Yes   Alcohol/week: 5.0 standard drinks of alcohol   Types: 2 Glasses of wine, 3 Cans of beer per week   Comment: SOCIAL 3 TIMES PER WEEK 2 DRINKS AT A TIME     Social History   Substance and Sexual Activity  Drug Use Not Currently   Types: Marijuana   Comment: HX OF MONTHS AGO    Social History    Socioeconomic History   Marital status: Single    Spouse name: Not on file   Number of children: Not on file   Years of education: Not on file   Highest education level: Bachelor's degree (e.g., BA, AB, BS)  Occupational History   Not on file  Tobacco Use   Smoking status: Former    Current packs/day: 0.00    Average packs/day: 0.5 packs/day for 5.0 years (2.5 ttl pk-yrs)    Types: Cigarettes    Start date: 05/20/1983    Quit date: 05/19/1988    Years since quitting: 35.5   Smokeless tobacco: Never   Tobacco comments:    quit in 1990  Vaping Use   Vaping status: Never Used  Substance and Sexual Activity   Alcohol use: Yes    Alcohol/week: 5.0 standard drinks of alcohol    Types: 2 Glasses of wine, 3 Cans of beer per week    Comment: SOCIAL 3 TIMES PER WEEK 2 DRINKS AT A TIME   Drug use: Not Currently    Types: Marijuana    Comment: HX OF MONTHS AGO   Sexual activity: Never  Other Topics Concern   Not on file  Social History Narrative   Single. 1 dog, 2 cats. Lives alone.       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 Enjoys dinner parties.    Social Drivers of Health   Financial Resource Strain: High Risk (08/09/2023)   Overall Financial Resource Strain (CARDIA)    Difficulty of Paying Living Expenses: Hard  Food Insecurity: Food Insecurity Present (11/07/2023)   Hunger Vital Sign    Worried About Running Out of Food in the Last Year: Sometimes true    Ran Out of Food in the Last Year: Sometimes true  Transportation Needs: No Transportation Needs (11/07/2023)   PRAPARE - Administrator, Civil Service (Medical): No    Lack of Transportation (Non-Medical): No  Recent Concern: Transportation Needs - Unmet Transportation  Needs (08/09/2023)   PRAPARE - Transportation    Lack of Transportation (Medical): Yes    Lack of Transportation (Non-Medical): Yes  Physical Activity: Sufficiently Active (08/09/2023)   Exercise Vital Sign    Days of Exercise per Week: 7 days    Minutes of Exercise per Session: 30 min  Stress: Stress Concern Present (08/09/2023)   [employer] of Occupational Health - Occupational Stress Questionnaire    Feeling of Stress : To some extent  Social Connections: Moderately Integrated (08/09/2023)   Social Connection and Isolation Panel    Frequency of Communication with Friends and Family: More than three times a week    Frequency of Social Gatherings with Friends and Family: Once a week    Attends Religious Services: More than 4 times per year    Active Member of Clubs or Organizations: Yes    Attends Engineer, structural: More than 4 times per year    Marital Status: Never married   Past Medical History:  Past Medical History:  Diagnosis Date   Cancer (HCC)    melanoma;  right ear   Depression    Diabetes mellitus    GERD (gastroesophageal reflux disease)    Hyperlipidemia    Hypertension    Metabolic syndrome    Obesity    PONV (postoperative nausea and vomiting)    YRS AGO WITHER ETHER    Past Surgical History:  Procedure Laterality Date   COLONOSCOPY  05/01/2013   CYST REMOVAL TRUNK  1989   benign   MELANOMA EXCISION  2008   right ear and neck   ORIF HUMERUS FRACTURE Left 12/23/2020   Procedure: OPEN REDUCTION INTERNAL FIXATION (ORIF) PROXIMAL HUMERUS FRACTURE;  Surgeon: [name], MD;  Location: [facility];  Service: Orthopedics;  Laterality: Left;    POLYPECTOMY     reconstructive plastic surgery face  1967   dog bite at age 17; about 64 surgeries for 10 years   TONSILLECTOMY  1966   age 54    Current Medications: Current Facility-Administered Medications  Medication Dose Route Frequency Provider Last Rate Last Admin   acetaminophen  (TYLENOL )  tablet 650 mg  650 mg Oral Q6H PRN Ajibola, Ene A, NP   650 mg at 11/16/23 1609   alum & mag hydroxide-simeth (MAALOX/MYLANTA) 200-200-20 MG/5ML suspension 30 mL  30 mL Oral Q4H PRN Ajibola, Ene A, NP       ARIPiprazole  (ABILIFY ) tablet 10 mg  10 mg Oral QHS Clydine Parkison, MD   10 mg at 11/16/23 2055   ARIPiprazole  (ABILIFY ) tablet 5 mg  5 mg Oral Daily Jyasia Markoff,  Ica Daye, MD   5 mg at 11/16/23 9162   atorvastatin  (LIPITOR) tablet 40 mg  40 mg Oral Daily Ajibola, Ene A, NP   40 mg at 11/16/23 9162   glipiZIDE  (GLUCOTROL  XL) 24 hr tablet 5 mg  5 mg Oral BID AC Aracelie Addis, MD   5 mg at 11/16/23 1717   hydrOXYzine  (ATARAX ) tablet 25 mg  25 mg Oral TID PRN Ajibola, Ene A, NP   25 mg at 11/16/23 2055   insulin  aspart (novoLOG ) injection 0-15 Units  0-15 Units Subcutaneous TID WC Ajibola, Ene A, NP   5 Units at 11/16/23 1716   insulin  aspart (novoLOG ) injection 0-5 Units  0-5 Units Subcutaneous QHS Ajibola, Ene A, NP   2 Units at 11/15/23 2105   lisinopril  (ZESTRIL ) tablet 40 mg  40 mg Oral Daily Ajibola, Ene A, NP   40 mg at 11/16/23 9162   magnesium  hydroxide (MILK OF MAGNESIA) suspension 30 mL  30 mL Oral Daily PRN Ajibola, Ene A, NP       metFORMIN  (GLUCOPHAGE ) tablet 1,000 mg  1,000 mg Oral BID WC Ajibola, Ene A, NP   1,000 mg at 11/16/23 1716   pantoprazole  (PROTONIX ) EC tablet 40 mg  40 mg Oral Daily Ajibola, Ene A, NP   40 mg at 11/16/23 9157   sertraline  (ZOLOFT ) tablet 150 mg  150 mg Oral Daily Ajibola, Ene A, NP   150 mg at 11/16/23 9163   traZODone  (DESYREL ) tablet 50 mg  50 mg Oral QHS PRN Ajibola, Ene A, NP   50 mg at 11/16/23 2055    Lab Results:  Results for orders placed or performed during the hospital encounter of 11/06/23 (from the past 48 hours)  Glucose, capillary     Status: Abnormal   Collection Time: 11/15/23  7:47 AM  Result Value Ref Range   Glucose-Capillary 222 (H) 70 - 99 mg/dL    Comment: Glucose reference range applies only to samples taken after fasting for at  least 8 hours.  Glucose, capillary     Status: Abnormal   Collection Time: 11/15/23 11:44 AM  Result Value Ref Range   Glucose-Capillary 177 (H) 70 - 99 mg/dL    Comment: Glucose reference range applies only to samples taken after fasting for at least 8 hours.  Glucose, capillary     Status: Abnormal   Collection Time: 11/15/23  4:29 PM  Result Value Ref Range   Glucose-Capillary 122 (H) 70 - 99 mg/dL    Comment: Glucose reference range applies only to samples taken after fasting for at least 8 hours.  Glucose, capillary     Status: Abnormal   Collection Time: 11/15/23  7:56 PM  Result Value Ref Range   Glucose-Capillary 217 (H) 70 - 99 mg/dL    Comment: Glucose reference range applies only to samples taken after fasting for at least 8 hours.   Comment 1 Notify RN   Glucose, capillary     Status: Abnormal   Collection Time: 11/16/23  7:37 AM  Result Value Ref Range   Glucose-Capillary 187 (H) 70 - 99 mg/dL    Comment: Glucose reference range applies only to samples taken after fasting for at least 8 hours.   Comment 1 Notify RN   Glucose, capillary     Status: Abnormal   Collection Time: 11/16/23 11:34 AM  Result Value Ref Range   Glucose-Capillary 242 (H) 70 - 99 mg/dL    Comment: Glucose reference range applies only to  samples taken after fasting for at least 8 hours.  Glucose, capillary     Status: Abnormal   Collection Time: 11/16/23  4:36 PM  Result Value Ref Range   Glucose-Capillary 215 (H) 70 - 99 mg/dL    Comment: Glucose reference range applies only to samples taken after fasting for at least 8 hours.   Comment 1 Notify RN   Glucose, capillary     Status: Abnormal   Collection Time: 11/16/23  8:14 PM  Result Value Ref Range   Glucose-Capillary 157 (H) 70 - 99 mg/dL    Comment: Glucose reference range applies only to samples taken after fasting for at least 8 hours.    Blood Alcohol level:  Lab Results  Component Value Date   [facility] <15 11/06/2023   ETH <15  08/25/2023    Metabolic Disorder Labs: Lab Results  Component Value Date   HGBA1C 10.6 (H) 11/06/2023   MPG 258 11/06/2023   MPG 223.08 08/25/2023   No results found for: PROLACTIN Lab Results  Component Value Date   CHOL 279 (H) 11/06/2023   TRIG 282 (H) 11/06/2023   HDL 60 11/06/2023   CHOLHDL 4.7 11/06/2023   VLDL 56 (H) 11/06/2023   LDLCALC 163 (H) 11/06/2023   LDLCALC 57 08/25/2023    Physical Findings: AIMS:  , ,  ,  ,    CIWA:    COWS:      Psychiatric Specialty Exam:  Presentation  General Appearance:  Appropriate for Environment  Eye Contact: Good  Speech: Normal Rate  Speech Volume: Normal    Mood and Affect  Mood: Euthymic  Affect: Appropriate   Thought Process  Thought Processes: Coherent; Linear  Descriptions of Associations:Intact  Orientation:Full (Time, Place and Person)  Thought Content:WDL  Hallucinations: Denies  Ideas of Reference:None  Suicidal Thoughts: Denies  Homicidal Thoughts: Denies   Sensorium  Memory: Immediate Good; Recent Good; Remote Good  Judgment: Good  Insight: Good   Executive Functions  Concentration: Good  Attention Span: Good  Recall: Good  Fund of Knowledge: Good  Language: Good   Psychomotor Activity  Psychomotor Activity: No data recorded  Musculoskeletal: Strength & Muscle Tone: within normal limits Gait & Station: normal Assets  Assets: Manufacturing systems engineer; Desire for Improvement    Physical Exam: Physical Exam Vitals and nursing note reviewed.    ROS Blood pressure (!) 178/79, pulse 74, temperature (!) 97 F (36.1 C), resp. rate 12, height 5' 8 (1.727 m), weight 108.4 kg, SpO2 98%. Body mass index is 36.34 kg/m.  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 Psychiatric Diagnoses and Treatment:  Continue abilify  10 mg at bedtime and 5 mg daily Continue sertraline  150 mg daily      3. Medical Issues Being Addressed:     4. Discharge Planning: Working on getting him ACT team and safe discharge planning to sober living facilities if possible  -- Social work and case management to assist with discharge planning and identification of  hospital follow-up needs prior to discharge  -- Estimated LOS: TBD  Allyn Foil, MD 11/16/2023, 10:47 PM

## 2023-11-16 NOTE — Group Note (Unsigned)
 Date:  11/17/2023 Time:  5:50 PM  Group Topic/Focus:  Coping With Mental Health Crisis:   The purpose of this group is to help patients identify strategies for coping with mental health crisis.  Group discusses possible causes of crisis and ways to manage them effectively.    Participation Level:  Active  Participation Quality:  Appropriate  Affect:  Appropriate  Cognitive:  Appropriate  Insight: Appropriate  Engagement in Group:  Engaged  Modes of Intervention:  Discussion  Additional Comments:    [name] 11/17/2023, 5:50 PM

## 2023-11-16 NOTE — Progress Notes (Signed)
   11/16/23 0837  Psych Admission Type (Psych Patients Only)  Admission Status Voluntary  Psychosocial Assessment  Patient Complaints None  Eye Contact Fair  Facial Expression Animated  Affect Appropriate to circumstance  Speech Logical/coherent  Interaction Assertive  Motor Activity Slow  Appearance/Hygiene Unremarkable  Behavior Characteristics Cooperative;Appropriate to situation  Mood Pleasant  Thought Process  Coherency WDL  Content WDL  Delusions None reported or observed  Perception WDL  Hallucination None reported or observed  Judgment Limited  Confusion None  Danger to Self  Current suicidal ideation? Denies  Agreement Not to Harm Self Yes  Description of Agreement Verbal  Danger to Others  Danger to Others None reported or observed

## 2023-11-16 NOTE — Group Note (Signed)
 Date:  11/16/2023 Time:  5:30 PM  Group Topic/Focus:  Wellness Toolbox:   The focus of this group is to discuss various aspects of wellness, balancing those aspects and exploring ways to increase the ability to experience wellness.  Patients will create a wellness toolbox for use upon discharge.    Participation Level:  Active  Participation Quality:  Appropriate  Affect:  Appropriate  Cognitive:  Appropriate  Insight: Appropriate  Engagement in Group:  Engaged  Modes of Intervention:  Activity and Socialization  Additional Comments:    [name] 11/16/2023, 5:30 PM

## 2023-11-16 NOTE — Progress Notes (Signed)
"  I feel good and it is the medication."  Pt is visible and participating in unit's activities, engaging and interacting with peers.  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  He denied SI/HI and AVH.  At about 0200, Pt awake and OOB, c/o generalized pain; Tylenol  given.  He remains care compliant.   11/15/23 2200  Psych Admission Type (Psych Patients Only)  Admission Status Voluntary  Psychosocial Assessment  Patient Complaints None  Eye Contact Fair  Facial Expression Flat  Affect Anxious  Speech Logical/coherent  Interaction Assertive  Motor Activity Slow  Appearance/Hygiene In scrubs  Behavior Characteristics Cooperative;Appropriate to situation  Mood Pleasant  Aggressive Behavior  Effect No apparent injury  Thought Process  Coherency WDL  Content WDL  Delusions None reported or observed  Perception WDL  Hallucination None reported or observed  Judgment Limited  Confusion None  Danger to Self  Current suicidal ideation? Denies  Agreement Not to Harm Self Yes  Description of Agreement verbal    Problem: Education: Goal: Knowledge of [facility] Education information/materials will improve Outcome: Progressing Goal: Emotional status will improve Outcome: Progressing Goal: Mental status will improve Outcome: Progressing Goal: Verbalization of understanding the information provided will improve Outcome: Progressing   Problem: Activity: Goal: Interest or engagement in activities will improve Outcome: Progressing Goal: Sleeping patterns will improve Outcome: Progressing   Problem: Coping: Goal: Ability to verbalize frustrations and anger appropriately will improve Outcome: Progressing Goal: Ability to demonstrate self-control will improve Outcome: Progressing

## 2023-11-16 NOTE — Group Note (Signed)
 LCSW Group Therapy Note  Group Date: 11/16/2023 Start Time: 1330 End Time: 1430   Type of Therapy and Topic:  Group Therapy - How To Cope with Nervousness about Discharge   Participation Level:  Active   Description of Group This process group involved identification of patients' feelings about discharge. Some of them are scheduled to be discharged soon, while others are new admissions, but each of them was asked to share thoughts and feelings surrounding discharge from the hospital. One common theme was that they are excited at the prospect of going home, while another was that many of them are apprehensive about sharing why they were hospitalized. Patients were given the opportunity to discuss these feelings with their peers in preparation for discharge.  Therapeutic Goals  Patient will identify their overall feelings about pending discharge. Patient will think about how they might proactively address issues that they believe will once again arise once they get home (i.e. with parents). Patients will participate in discussion about having hope for change.   Summary of Patient Progress:   He was very active throughout the session. He demonstrated fair insight into the subject matter, and proved open to input from peers and feedback from CSW. He was respectful of peers and participated throughout the entire session.   Therapeutic Modalities Cognitive Behavioral Therapy   Sherryle JINNY Margo, LCSW 11/16/2023  2:48 PM

## 2023-11-16 NOTE — [facility] Suicide Risk Assessment (Signed)
 [facility] Discharge Suicide Risk Assessment   Principal Problem: MDD (major depressive disorder) Discharge Diagnoses: Principal Problem:   MDD (major depressive disorder)   Total Time spent with patient: 30 minutes  Musculoskeletal: Strength & Muscle Tone: within normal limits Gait & Station: normal Patient leans: N/A  Psychiatric Specialty Exam  Presentation  General Appearance:  Appropriate for Environment; Casual  Eye Contact: Fair  Speech: Clear and Coherent  Speech Volume: Normal  Handedness: Right   Mood and Affect  Mood: Euthymic  Duration of Depression Symptoms: Less than two weeks (Hx of mental health, current MH symptoms triggered 11/05/2023)  Affect: Appropriate   Thought Process  Thought Processes: Coherent  Descriptions of Associations:Intact  Orientation:Full (Time, Place and Person)  Thought Content:Logical  History of Schizophrenia/Schizoaffective disorder:No  Duration of Psychotic Symptoms:No data recorded Hallucinations:Hallucinations: None  Ideas of Reference:None  Suicidal Thoughts:Suicidal Thoughts: No  Homicidal Thoughts:Homicidal Thoughts: No   Sensorium  Memory: Immediate Fair; Recent Fair; Remote Fair  Judgment: Fair  Insight: Fair   Art therapist  Concentration: Fair  Attention Span: Fair  Recall: [employer] of Knowledge: Fair  Language: Fair   Psychomotor Activity  Psychomotor Activity: Psychomotor Activity: Normal   Assets  Assets: Communication Skills; Desire for Improvement; Resilience   Sleep  Sleep: Sleep: Fair  Estimated Sleeping Duration (Last 24 Hours): 8.75-10.25 hours  Physical Exam: Physical Exam Vitals and nursing note reviewed.    ROS Blood pressure (!) 178/79, pulse 74, temperature (!) 97 F (36.1 C), resp. rate 12, height 5' 8 (1.727 m), weight 108.4 kg, SpO2 98%. Body mass index is 36.34 kg/m.  Mental Status Per Nursing Assessment::   On Admission:   Suicide plan  Demographic Factors:  Male and Caucasian  Loss Factors: Decrease in vocational status  Historical Factors: Impulsivity  Risk Reduction Factors:   Positive social support, Positive therapeutic relationship, and Positive coping skills or problem solving skills  Continued Clinical Symptoms:  Depression:   Impulsivity  Cognitive Features That Contribute To Risk:  None    Suicide Risk:  Minimal: No identifiable suicidal ideation.  Patients presenting with no risk factors but with morbid ruminations; may be classified as minimal risk based on the severity of the depressive symptoms   Follow-up Information     [facility] Follow up.   Why: Virtual therapy appointment is 11/24/23 at 1 PM.  Virtual psychiatry appointment is 11/25/23 AT 2:20 PM. Contact information: [address]  [address] [place] [address] [phone]         [facility] [place]) Follow up.   Contact information: Phone: [phone]   Fax: [phone]   Address: [address], [place], [place] [address]                Plan Of Care/Follow-up recommendations:  Activity:  As tolerated  Allyn Foil, MD 11/16/2023, 10:54 PM

## 2023-11-16 NOTE — Group Note (Signed)
 Date:  11/16/2023 Time:  3:39 PM  Group Topic/Focus:  Spirituality:   The focus of this group is to discuss how one's spirituality can aide in recovery.    Participation Level:  Active  Participation Quality:  Appropriate  Affect:  Appropriate  Cognitive:  Appropriate  Insight: Appropriate  Engagement in Group:  Engaged  Modes of Intervention:  Discussion  Additional Comments:   [name] 11/16/2023, 3:39 PM

## 2023-11-16 NOTE — Group Note (Signed)
 Date:  11/16/2023 Time:  10:08 PM  Group Topic/Focus:  Goals Group:   The focus of this group is to help patients establish daily goals to achieve during treatment and discuss how the patient can incorporate goal setting into their daily lives to aide in recovery.    Participation Level:  Active  Participation Quality:  Attentive  Affect:  Appropriate  Cognitive:  Appropriate  Insight: Appropriate  Engagement in Group:  Engaged  Modes of Intervention:  Discussion  Additional Comments:     Lacinda JINNY Simpers 11/16/2023, 10:08 PM

## 2023-11-17 ENCOUNTER — Other Ambulatory Visit: Payer: Self-pay

## 2023-11-17 LAB — GLUCOSE, CAPILLARY: Glucose-Capillary: 195 mg/dL — ABNORMAL HIGH (ref 70–99)

## 2023-11-17 MED ORDER — ARIPIPRAZOLE 5 MG PO TABS
5.0000 mg | ORAL_TABLET | Freq: Every day | ORAL | 0 refills | Status: DC
  Filled 2023-11-17: qty 30, 30d supply, fill #0

## 2023-11-17 MED ORDER — SERTRALINE HCL 50 MG PO TABS
150.0000 mg | ORAL_TABLET | Freq: Every day | ORAL | 0 refills | Status: DC
  Filled 2023-11-17 – 2023-11-18 (×2): qty 90, 30d supply, fill #0

## 2023-11-17 MED ORDER — GLIPIZIDE ER 5 MG PO TB24
5.0000 mg | ORAL_TABLET | Freq: Two times a day (BID) | ORAL | 0 refills | Status: DC
  Filled 2023-11-17 – 2023-11-18 (×2): qty 60, 30d supply, fill #0

## 2023-11-17 MED ORDER — INSULIN ASPART 100 UNIT/ML IJ SOLN
0.0000 [IU] | Freq: Every day | INTRAMUSCULAR | 11 refills | Status: DC
  Filled 2023-11-17: qty 10, 200d supply, fill #0

## 2023-11-17 MED ORDER — INSULIN LISPRO (1 UNIT DIAL) 100 UNIT/ML (KWIKPEN)
PEN_INJECTOR | SUBCUTANEOUS | 11 refills | Status: DC
  Filled 2023-11-17 – 2023-11-18 (×2): qty 15, 30d supply, fill #0

## 2023-11-17 MED ORDER — ARIPIPRAZOLE 10 MG PO TABS
10.0000 mg | ORAL_TABLET | Freq: Every day | ORAL | 0 refills | Status: DC
  Filled 2023-11-17 – 2023-11-18 (×2): qty 30, 30d supply, fill #0

## 2023-11-17 NOTE — Plan of Care (Signed)
  Problem: Education: Goal: Knowledge of [facility] Education information/materials will improve Outcome: Adequate for Discharge Goal: Emotional status will improve Outcome: Adequate for Discharge Goal: Mental status will improve Outcome: Adequate for Discharge Goal: Verbalization of understanding the information provided will improve Outcome: Adequate for Discharge   Problem: Activity: Goal: Interest or engagement in activities will improve Outcome: Adequate for Discharge Goal: Sleeping patterns will improve Outcome: Adequate for Discharge   Problem: Coping: Goal: Ability to verbalize frustrations and anger appropriately will improve Outcome: Adequate for Discharge Goal: Ability to demonstrate self-control will improve Outcome: Adequate for Discharge   Problem: Health Behavior/Discharge Planning: Goal: Identification of resources available to assist in meeting health care needs will improve Outcome: Adequate for Discharge Goal: Compliance with treatment plan for underlying cause of condition will improve Outcome: Adequate for Discharge   Problem: Physical Regulation: Goal: Ability to maintain clinical measurements within normal limits will improve Outcome: Adequate for Discharge   Problem: Safety: Goal: Periods of time without injury will increase Outcome: Adequate for Discharge   Problem: Coping: Goal: Ability to adjust to condition or change in health will improve Outcome: Adequate for Discharge   Problem: Fluid Volume: Goal: Ability to maintain a balanced intake and output will improve Outcome: Adequate for Discharge   Problem: Metabolic: Goal: Ability to maintain appropriate glucose levels will improve Outcome: Adequate for Discharge   Problem: Nutritional: Goal: Maintenance of adequate nutrition will improve Outcome: Adequate for Discharge Goal: Progress toward achieving an optimal weight will improve Outcome: Adequate for Discharge   Problem: Skin  Integrity: Goal: Risk for impaired skin integrity will decrease Outcome: Adequate for Discharge   Problem: Tissue Perfusion: Goal: Adequacy of tissue perfusion will improve Outcome: Adequate for Discharge

## 2023-11-17 NOTE — Group Note (Signed)
 Date:  11/17/2023 Time:  10:11 AM  Group Topic/Focus:  Goals Group:   The focus of this group is to help patients establish daily goals to achieve during treatment and discuss how the patient can incorporate goal setting into their daily lives to aide in recovery.    Participation Level:  Active  Participation Quality:  Appropriate  Affect:  Appropriate  Cognitive:  Appropriate  Insight: Appropriate  Engagement in Group:  Engaged  Modes of Intervention:  Discussion, Education, and Support  Additional Comments:    Deitra Caron [place] 11/17/2023, 10:11 AM

## 2023-11-17 NOTE — Progress Notes (Signed)
  [facility] Adult Case Management Discharge Plan :  Will you be returning to the same living situation after discharge:  Yes,  Patient wll go home to gather his belongings and then will attend [facility].  At discharge, do you have transportation home?: Yes,  CSW has arranged taxi services on the patient's behalf.  Do you have the ability to pay for your medications: Yes,  [facility]  Release of information consent forms completed and in the chart;  Patient's signature needed at discharge.  Patient to Follow up at:  Follow-up Information     [facility] Follow up.   Why: Virtual therapy appointment is 11/24/23 at 1 PM.  Virtual psychiatry appointment is 11/25/23 AT 2:20 PM. Contact information: [address]  [address] [place] [address] [phone]         [facility]) Follow up.   Contact information: Phone: [phone]   Fax: [phone]   Address: [address], [place], [place] [address]                Next level of care provider has access to [facility] Link:no  Safety Planning and Suicide Prevention discussed: Yes,  Contact Attempts: [name], [phone], Mother, has been identified by the patient as the family member/significant other with whom the patient will be residing, and identified as the person(s) who will aid the patient in the event of a mental health crisis. SPE completed with pt, as pt refused to consent to family contact. SPI pamphlet provided to pt and pt was encouraged to share information with support network, ask questions, and talk about any concerns relating to SPE. Pt denies access to guns/firearms and verbalized understanding of information provided. Mobile Crisis information also provided to pt.    Has patient been referred to the Quitline?: Patient does not use tobacco/nicotine products  Patient has been referred for  addiction treatment: No known substance use disorder.  Alveta CHRISTELLA Kerns, LCSW 11/17/2023, 9:16 AM

## 2023-11-17 NOTE — Progress Notes (Signed)
   11/16/23 2000  Psych Admission Type (Psych Patients Only)  Admission Status Voluntary  Psychosocial Assessment  Patient Complaints None  Eye Contact Fair  Facial Expression Animated  Affect Appropriate to circumstance  Speech Logical/coherent  Interaction Assertive  Motor Activity Slow  Appearance/Hygiene Unremarkable  Behavior Characteristics Cooperative;Appropriate to situation  Mood Pleasant  Aggressive Behavior  Effect No apparent injury  Thought Process  Coherency WDL  Content WDL  Delusions None reported or observed  Perception WDL  Hallucination None reported or observed  Judgment Limited  Confusion None  Danger to Self  Current suicidal ideation? Denies  Agreement Not to Harm Self Yes  Description of Agreement Verbal  Danger to Others  Danger to Others None reported or observed

## 2023-11-17 NOTE — Progress Notes (Signed)
 Discharge Note:  Patient denies SI/HI/AVH at this time. Discharge instructions, AVS, prescriptions, and transition record reviewed with patient. Patient agrees to comply with medication management, follow-up visit, and outpatient therapy. Patient belongings returned to patient. Patient questions and concerns addressed and answered. Patient ambulatory off unit @ 1030am  Patient discharged to home with self care and escorted by taxi.

## 2023-11-18 ENCOUNTER — Telehealth: Payer: Self-pay | Admitting: *Deleted

## 2023-11-18 ENCOUNTER — Other Ambulatory Visit: Payer: Self-pay

## 2023-11-18 MED ORDER — AMLODIPINE BESYLATE 10 MG PO TABS
10.0000 mg | ORAL_TABLET | Freq: Every day | ORAL | 1 refills | Status: DC
  Filled 2023-11-18: qty 90, 90d supply, fill #0

## 2023-11-18 MED ORDER — PRAZOSIN HCL 1 MG PO CAPS
1.0000 mg | ORAL_CAPSULE | Freq: Every day | ORAL | 1 refills | Status: DC
  Filled 2023-11-18: qty 30, 30d supply, fill #0

## 2023-11-18 NOTE — Transitions of Care (Post Inpatient/ED Visit) (Signed)
   11/18/2023  Name: [name] MRN: [id number] DOB: 1960/12/27  Today's TOC FU Call Status: Today's TOC FU Call Status:: Unsuccessful Call (1st Attempt) Unsuccessful Call (1st Attempt) Date: 11/18/23  Attempted to reach the patient regarding the most recent Inpatient/ED visit.  Follow Up Plan: Additional outreach attempts will be made to reach the patient to complete the Transitions of Care (Post Inpatient/ED visit) call.   Mliss Creed [facility], BSN RN Care Manager/ Transition of Care [place] [phone]

## 2023-11-19 NOTE — Discharge Summary (Signed)
 Physician Discharge Summary Note  Patient:  [name] is an 63 y.o., male MRN:  [id number] DOB:  Sep 01, 1960 Patient phone:  [phone] (home)  Patient address:   [address] [place] [address],   Total time spent: 40 min Date of Admission:  11/06/2023 Date of Discharge: 11/17/23  Reason for Admission:   [name] presents to [facility] via [facility] for suicidal ideations with a plan to overdose on fentanyl  he can buy off the street. Reported negative intrusive thoughts started yesterday (11/05/2023) and worsen overnight with progressive suicidal thought Patient is admitted to adult psych unit with Q15 min safety monitoring. Multidisciplinary team approach is offered. Medication management; group/milieu therapy is offered.   Principal Problem: MDD (major depressive disorder) Discharge Diagnoses: Principal Problem:   MDD (major depressive disorder)   Past Psychiatric History: see h&p  Family Psychiatric  History: see h&p Social History:  Social History   Substance and Sexual Activity  Alcohol Use Yes   Alcohol/week: 5.0 standard drinks of alcohol   Types: 2 Glasses of wine, 3 Cans of beer per week   Comment: SOCIAL 3 TIMES PER WEEK 2 DRINKS AT A TIME     Social History   Substance and Sexual Activity  Drug Use Not Currently   Types: Marijuana   Comment: HX OF MONTHS AGO    Social History   Socioeconomic History   Marital status: Single    Spouse name: Not on file   Number of children: Not on file   Years of education: Not on file   Highest education level: Bachelor's degree (e.g., BA, AB, BS)  Occupational History   Not on file  Tobacco Use   Smoking status: Former    Current packs/day: 0.00    Average packs/day: 0.5 packs/day for 5.0 years (2.5 ttl pk-yrs)    Types: Cigarettes    Start date: 05/20/1983    Quit date: 05/19/1988    Years since quitting: 35.5   Smokeless tobacco: Never   Tobacco comments:    quit in 1990  Vaping Use   Vaping status:  Never Used  Substance and Sexual Activity   Alcohol use: Yes    Alcohol/week: 5.0 standard drinks of alcohol    Types: 2 Glasses of wine, 3 Cans of beer per week    Comment: SOCIAL 3 TIMES PER WEEK 2 DRINKS AT A TIME   Drug use: Not Currently    Types: Marijuana    Comment: HX OF MONTHS AGO   Sexual activity: Never  Other Topics Concern   Not on file  Social History Narrative   Single. 1 dog, 2 cats. Lives alone.       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 Enjoys dinner parties.    Social Drivers of Health   Financial Resource Strain: High Risk (08/09/2023)   Overall Financial Resource Strain (CARDIA)    Difficulty of Paying Living Expenses: Hard  Food Insecurity: Food Insecurity Present (11/07/2023)   Hunger Vital Sign    Worried About Running Out of Food in the Last Year: Sometimes true    Ran Out of Food in the Last Year: Sometimes true  Transportation Needs: No Transportation Needs (11/07/2023)   PRAPARE - Transportation  Lack of Transportation (Medical): No    Lack of Transportation (Non-Medical): No  Recent Concern: Transportation Needs - Unmet Transportation Needs (08/09/2023)   PRAPARE - Transportation    Lack of Transportation (Medical): Yes    Lack of Transportation (Non-Medical): Yes  Physical Activity: Sufficiently Active (08/09/2023)   Exercise Vital Sign    Days of Exercise per Week: 7 days    Minutes of Exercise per Session: 30 min  Stress: Stress Concern Present (08/09/2023)   [employer] of Occupational Health - Occupational Stress Questionnaire    Feeling of Stress : To some extent  Social Connections: Moderately Integrated (08/09/2023)   Social Connection and Isolation Panel    Frequency of Communication with  Friends and Family: More than three times a week    Frequency of Social Gatherings with Friends and Family: Once a week    Attends Religious Services: More than 4 times per year    Active Member of Clubs or Organizations: Yes    Attends Engineer, structural: More than 4 times per year    Marital Status: Never married   Past Medical History:  Past Medical History:  Diagnosis Date   Cancer (HCC)    melanoma;  right ear   Depression    Diabetes mellitus    GERD (gastroesophageal reflux disease)    Hyperlipidemia    Hypertension    Metabolic syndrome    Obesity    PONV (postoperative nausea and vomiting)    YRS AGO WITHER ETHER    Past Surgical History:  Procedure Laterality Date   COLONOSCOPY  05/01/2013   CYST REMOVAL TRUNK  1989   benign   MELANOMA EXCISION  2008   right ear and neck   ORIF HUMERUS FRACTURE Left 12/23/2020   Procedure: OPEN REDUCTION INTERNAL FIXATION (ORIF) PROXIMAL HUMERUS FRACTURE;  Surgeon: [name], MD;  Location: [facility];  Service: Orthopedics;  Laterality: Left;    POLYPECTOMY     reconstructive plastic surgery face  1967   dog bite at age 64; about 69 surgeries for 10 years   TONSILLECTOMY  45   age 50   Family History:  Family History  Problem Relation Age of Onset   Arthritis Mother    Stroke Father 106       heavy smoker 4-5 packs a day unfiltered   Schizophrenia Father        homeless and did not get medical care in time   Alcoholism Father    Colon cancer Neg Hx    Esophageal cancer Neg Hx    Rectal cancer Neg Hx    Stomach cancer Neg Hx    Colon polyps Neg Hx     Hospital Course:   [name] presents to [facility] via [facility] for suicidal ideations with a plan to overdose on fentanyl  he can buy off the street. Reported negative intrusive thoughts started yesterday (11/05/2023) and worsen overnight with progressive suicidal thought Patient is admitted to adult psych unit with Q15 min safety monitoring. Multidisciplinary  team approach is offered. Medication management; group/milieu therapy is offered.  Detailed risk assessment is complete based on clinical exam and individual risk factors and acute suicide risk is low and acute violence risk is low.     On admission patient was maintained on Zoloft  150 mg daily to help with the depression and he was started on a mood stabilizer Abilify  5 mg which was titrated up to 5 mg every morning 10 mg nightly to  help with the mood stabilization and chronic suicidal ideation.  Patient maintained safe behaviors during the admission and participated in groups.  On the day of discharge he consistently denied SI/HI/plan and denied hallucinations.  Social worker was able to find him a placement to transition from inpatient unit.  He remains future oriented and is willing to participate in outpatient mental health services. Currently, all modifiable risk of harm to self/harm to others have been addressed and patient is no longer appropriate for the acute inpatient setting and is able to continue treatment for mental health needs in the community with the supports as indicated below.  Patient is educated and verbalized understanding of discharge plan of care including medications, follow-up appointments, mental health resources and further crisis services in the community.  He is instructed to call 911 or present to the nearest emergency room should he experience any decompensation in mood, disturbance of bowel or return of suicidal/homicidal ideations.  Patient verbalizes understanding of this education and agrees to this plan of care  Physical Findings: AIMS:  , ,  ,  ,    CIWA:    COWS:        Psychiatric Specialty Exam:  Presentation  General Appearance:  Appropriate for Environment; Casual  Eye Contact: Fair  Speech: Clear and Coherent  Speech Volume: Normal    Mood and Affect  Mood: Euthymic  Affect: Appropriate   Thought Process  Thought  Processes: Coherent  Descriptions of Associations:Intact  Orientation:Full (Time, Place and Person)  Thought Content:Logical  Hallucinations: Denies Ideas of Reference:None  Suicidal Thoughts: Denies Homicidal Thoughts: Denies  Sensorium  Memory: Immediate Fair; Recent Fair; Remote Fair  Judgment: Fair  Insight: Fair   Art therapist  Concentration: Fair  Attention Span: Fair  Recall: Fair  Fund of Knowledge: Fair  Language: Fair   Psychomotor Activity  Psychomotor Activity:No data recorded Musculoskeletal: Strength & Muscle Tone: within normal limits Gait & Station: normal Assets  Assets: Manufacturing systems engineer; Desire for Improvement; Resilience   Sleep  Sleep:No data recorded   Physical Exam: Physical Exam ROS Blood pressure (!) 148/61, pulse 65, temperature (!) 96.8 F (36 C), resp. rate 18, height 5' 8 (1.727 m), weight 108.4 kg, SpO2 95%. Body mass index is 36.34 kg/m.   Social History   Tobacco Use  Smoking Status Former   Current packs/day: 0.00   Average packs/day: 0.5 packs/day for 5.0 years (2.5 ttl pk-yrs)   Types: Cigarettes   Start date: 05/20/1983   Quit date: 05/19/1988   Years since quitting: 35.5  Smokeless Tobacco Never  Tobacco Comments   quit in 1990   Tobacco Cessation:  N/A, patient does not currently use tobacco products   Blood Alcohol level:  Lab Results  Component Value Date   [facility] <15 11/06/2023   ETH <15 08/25/2023    Metabolic Disorder Labs:  Lab Results  Component Value Date   HGBA1C 10.6 (H) 11/06/2023   MPG 258 11/06/2023   MPG 223.08 08/25/2023   No results found for: PROLACTIN Lab Results  Component Value Date   CHOL 279 (H) 11/06/2023   TRIG 282 (H) 11/06/2023   HDL 60 11/06/2023   CHOLHDL 4.7 11/06/2023   VLDL 56 (H) 11/06/2023   LDLCALC 163 (H) 11/06/2023   LDLCALC 57 08/25/2023    See Psychiatric Specialty Exam and Suicide Risk Assessment completed by Attending  Physician prior to discharge.  Discharge destination:  Home  Is patient on multiple antipsychotic therapies at discharge:  No  Has Patient had three or more failed trials of antipsychotic monotherapy by history:  No  Recommended Plan for Multiple Antipsychotic Therapies: NA   Allergies as of 11/17/2023       Reactions   Other Other (See Comments)   Pollen - dries out right eye        Medication List     STOP taking these medications    traZODone  50 MG tablet Commonly known as: DESYREL        TAKE these medications      Indication  ARIPiprazole  5 MG tablet Commonly known as: ABILIFY  Take 1 tablet (5 mg total) by mouth daily. What changed: You were already taking a medication with the same name, and this prescription was added. Make sure you understand how and when to take each.  Indication: Major Depressive Disorder   ARIPiprazole  10 MG tablet Commonly known as: ABILIFY  Take 1 tablet (10 mg total) by mouth at bedtime. What changed: when to take this  Indication: Major Depressive Disorder   atorvastatin  40 MG tablet Commonly known as: LIPITOR Take 1 tablet (40 mg total) by mouth daily.  Indication: High Amount of Fats in the Blood   glipiZIDE  5 MG 24 hr tablet Commonly known as: GLUCOTROL  XL Take 1 tablet (5 mg total) by mouth 2 (two) times daily before a meal. What changed: when to take this  Indication: Type 2 Diabetes   insulin  aspart 100 UNIT/ML injection Commonly known as: novoLOG  Inject 0-5 Units into the skin at bedtime.  Indication: Type 2 Diabetes   insulin  lispro 100 UNIT/ML KwikPen Commonly known as: HumaLOG  KwikPen Inject 0-15 Units into the skin 3 (three) times daily with meals AND 0-5 Units at bedtime.  Indication: Type 2 Diabetes   lisinopril  40 MG tablet Commonly known as: ZESTRIL  Take 1 tablet (40 mg total) by mouth daily.  Indication: High Blood Pressure   metFORMIN  1000 MG tablet Commonly known as: GLUCOPHAGE  Take 1 tablet  (1,000 mg total) by mouth 2 (two) times daily with a meal.  Indication: Type 2 Diabetes   pantoprazole  40 MG tablet Commonly known as: PROTONIX  Take 1 tablet (40 mg total) by mouth daily.  Indication: Gastroesophageal Reflux Disease   sertraline  50 MG tablet Commonly known as: ZOLOFT  Take 3 tablets (150 mg total) by mouth daily.  Indication: Major Depressive Disorder        Follow-up Information     [facility] Follow up.   Why: Virtual therapy appointment is 11/24/23 at 1 PM.  Virtual psychiatry appointment is 11/25/23 AT 2:20 PM. Contact information: [address]  [address] [place] [address] [phone]         [facility]) Follow up.   Contact information: Phone: [phone]   Fax: [phone]   Address: [address], [place], [place] [address]                Follow-up recommendations:  Activity:  As tolerated    Signed: Kainoa Swoboda, MD 11/19/2023, 9:49 PM

## 2023-11-21 ENCOUNTER — Telehealth: Payer: Self-pay

## 2023-11-21 NOTE — Transitions of Care (Post Inpatient/ED Visit) (Signed)
   11/21/2023  Name: GERGORY BIELLO MRN: [id number] DOB: April 25, 1960  Today's TOC FU Call Status: Today's TOC FU Call Status:: Unsuccessful Call (2nd Attempt) Unsuccessful Call (2nd Attempt) Date: 11/21/23  Attempted to reach the patient regarding the most recent Inpatient/ED visit.  Follow Up Plan: Additional outreach attempts will be made to reach the patient to complete the Transitions of Care (Post Inpatient/ED visit) call.   Alan Ee, RN, BSN, CEN [employer]- Transition of Care Team.  [facility] [phone]

## 2023-11-22 ENCOUNTER — Telehealth: Payer: Self-pay

## 2023-11-22 ENCOUNTER — Other Ambulatory Visit: Payer: Self-pay

## 2023-11-22 NOTE — Transitions of Care (Post Inpatient/ED Visit) (Signed)
   11/22/2023  Name: [name] MRN: [id number] DOB: 1961/03/29  Today's TOC FU Call Status: Today's TOC FU Call Status:: Unsuccessful Call (3rd Attempt) Unsuccessful Call (3rd Attempt) Date: 11/22/23  Attempted to reach the patient regarding the most recent Inpatient/ED visit.  Follow Up Plan: No further outreach attempts will be made at this time. We have been unable to contact the patient.  Shona Prow RN, CCM [place]  VBCI-Population Health RN Care Manager [phone]

## 2023-11-25 ENCOUNTER — Other Ambulatory Visit: Payer: Self-pay

## 2023-11-25 MED ORDER — SERTRALINE HCL 50 MG PO TABS: 150.0000 mg | ORAL_TABLET | Freq: Every day | ORAL | 2 refills | Status: DC

## 2023-11-25 MED ORDER — PRAZOSIN HCL 1 MG PO CAPS: 1.0000 mg | ORAL_CAPSULE | Freq: Every day | ORAL | 2 refills | Status: DC

## 2023-11-25 MED ORDER — ARIPIPRAZOLE 10 MG PO TABS: 10.0000 mg | ORAL_TABLET | Freq: Every day | ORAL | 2 refills | Status: DC

## 2023-11-25 MED ORDER — TRAZODONE HCL 50 MG PO TABS
50.0000 mg | ORAL_TABLET | Freq: Every evening | ORAL | 2 refills | Status: DC | PRN
  Filled 2023-11-25: qty 30, 30d supply, fill #0

## 2023-12-07 ENCOUNTER — Other Ambulatory Visit: Payer: Self-pay

## 2023-12-08 ENCOUNTER — Ambulatory Visit: Payer: Self-pay | Admitting: Family Medicine

## 2023-12-09 ENCOUNTER — Ambulatory Visit (HOSPITAL_COMMUNITY)
Admission: EM | Admit: 2023-12-09 | Discharge: 2023-12-11 | Disposition: A | Discharge status: Home / Self Care | Attending: Psychiatry | Admitting: Psychiatry

## 2023-12-09 DIAGNOSIS — E119 Type 2 diabetes mellitus without complications: Secondary | ICD-10-CM | POA: Insufficient documentation

## 2023-12-09 DIAGNOSIS — Z794 Long term (current) use of insulin: Secondary | ICD-10-CM

## 2023-12-09 DIAGNOSIS — F329 Major depressive disorder, single episode, unspecified: Secondary | ICD-10-CM | POA: Diagnosis present

## 2023-12-09 DIAGNOSIS — Z59811 Housing instability, housed, with risk of homelessness: Secondary | ICD-10-CM | POA: Insufficient documentation

## 2023-12-09 DIAGNOSIS — F331 Major depressive disorder, recurrent, moderate: Secondary | ICD-10-CM | POA: Insufficient documentation

## 2023-12-09 DIAGNOSIS — Z7984 Long term (current) use of oral hypoglycemic drugs: Secondary | ICD-10-CM | POA: Insufficient documentation

## 2023-12-09 DIAGNOSIS — Z9151 Personal history of suicidal behavior: Secondary | ICD-10-CM | POA: Insufficient documentation

## 2023-12-09 DIAGNOSIS — Z79899 Other long term (current) drug therapy: Secondary | ICD-10-CM | POA: Diagnosis not present

## 2023-12-09 DIAGNOSIS — E1169 Type 2 diabetes mellitus with other specified complication: Secondary | ICD-10-CM

## 2023-12-09 DIAGNOSIS — Z592 Discord with neighbors, lodgers and landlord: Secondary | ICD-10-CM | POA: Insufficient documentation

## 2023-12-09 DIAGNOSIS — R45851 Suicidal ideations: Secondary | ICD-10-CM | POA: Diagnosis present

## 2023-12-09 MED ORDER — DIPHENHYDRAMINE HCL 50 MG PO CAPS: 50.0000 mg | ORAL_CAPSULE | Freq: Three times a day (TID) | ORAL | Status: DC | PRN

## 2023-12-09 MED ORDER — HALOPERIDOL 5 MG PO TABS: 5.0000 mg | ORAL_TABLET | Freq: Three times a day (TID) | ORAL | Status: DC | PRN

## 2023-12-09 MED ORDER — HALOPERIDOL LACTATE 5 MG/ML IJ SOLN: 5.0000 mg | Freq: Three times a day (TID) | INTRAMUSCULAR | Status: DC | PRN

## 2023-12-09 MED ORDER — DIPHENHYDRAMINE HCL 50 MG/ML IJ SOLN: 50.0000 mg | Freq: Three times a day (TID) | INTRAMUSCULAR | Status: DC | PRN

## 2023-12-09 MED ORDER — HYDROXYZINE HCL 25 MG PO TABS
25.0000 mg | ORAL_TABLET | Freq: Three times a day (TID) | ORAL | Status: DC | PRN
  Administered 2023-12-10: 25 mg via ORAL
  Filled 2023-12-09: qty 1
  Filled 2023-12-09: qty 6

## 2023-12-09 MED ORDER — TRAZODONE HCL 50 MG PO TABS
50.0000 mg | ORAL_TABLET | Freq: Every evening | ORAL | Status: DC | PRN
  Administered 2023-12-10: 50 mg via ORAL
  Filled 2023-12-09: qty 1
  Filled 2023-12-09: qty 2

## 2023-12-09 MED ORDER — HALOPERIDOL LACTATE 5 MG/ML IJ SOLN: 10.0000 mg | Freq: Three times a day (TID) | INTRAMUSCULAR | Status: DC | PRN

## 2023-12-09 MED ORDER — LORAZEPAM 2 MG/ML IJ SOLN: 2.0000 mg | Freq: Three times a day (TID) | INTRAMUSCULAR | Status: DC | PRN

## 2023-12-09 MED ORDER — ACETAMINOPHEN 325 MG PO TABS
650.0000 mg | ORAL_TABLET | Freq: Four times a day (QID) | ORAL | Status: DC | PRN
  Administered 2023-12-10 – 2023-12-11 (×4): 650 mg via ORAL
  Filled 2023-12-09 (×4): qty 2

## 2023-12-09 MED ORDER — ALUM & MAG HYDROXIDE-SIMETH 200-200-20 MG/5ML PO SUSP: 30.0000 mL | ORAL | Status: DC | PRN

## 2023-12-09 MED ORDER — MAGNESIUM HYDROXIDE 400 MG/5ML PO SUSP: 30.0000 mL | Freq: Every day | ORAL | Status: DC | PRN
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
## 2023-12-10 DIAGNOSIS — Z794 Long term (current) use of insulin: Secondary | ICD-10-CM

## 2023-12-10 DIAGNOSIS — F331 Major depressive disorder, recurrent, moderate: Secondary | ICD-10-CM | POA: Diagnosis not present

## 2023-12-10 DIAGNOSIS — E119 Type 2 diabetes mellitus without complications: Secondary | ICD-10-CM

## 2023-12-10 LAB — POCT URINE DRUG SCREEN - MANUAL ENTRY (I-SCREEN)
POC Amphetamine UR: NOT DETECTED
POC Buprenorphine (BUP): NOT DETECTED
POC Cocaine UR: NOT DETECTED
POC Marijuana UR: POSITIVE — AB
POC Methadone UR: NOT DETECTED
POC Methamphetamine UR: NOT DETECTED
POC Morphine: NOT DETECTED
POC Oxazepam (BZO): NOT DETECTED
POC Oxycodone UR: NOT DETECTED
POC Secobarbital (BAR): NOT DETECTED

## 2023-12-10 LAB — COMPREHENSIVE METABOLIC PANEL WITH GFR
ALT: 27 U/L (ref 0–44)
AST: 23 U/L (ref 15–41)
Albumin: 3.9 g/dL (ref 3.5–5.0)
Alkaline Phosphatase: 53 U/L (ref 38–126)
Anion gap: 15 (ref 5–15)
BUN: 13 mg/dL (ref 8–23)
CO2: 23 mmol/L (ref 22–32)
Calcium: 9.3 mg/dL (ref 8.9–10.3)
Chloride: 99 mmol/L (ref 98–111)
Creatinine, Ser: 1.03 mg/dL (ref 0.61–1.24)
GFR, Estimated: >60 mL/min (ref >60)
Glucose, Bld: 213 mg/dL — ABNORMAL HIGH (ref 70–99)
Potassium: 4 mmol/L (ref 3.5–5.1)
Sodium: 137 mmol/L (ref 135–145)
Total Bilirubin: 0.5 mg/dL (ref 0.0–1.2)
Total Protein: 6.9 g/dL (ref 6.5–8.1)

## 2023-12-10 LAB — CBC WITH DIFFERENTIAL/PLATELET
Abs Immature Granulocytes: 0.02 K/uL (ref 0.00–0.07)
Basophils Absolute: 0 K/uL (ref 0.0–0.1)
Basophils Relative: 1 %
Eosinophils Absolute: 0.4 K/uL (ref 0.0–0.5)
Eosinophils Relative: 5 %
HCT: 43.8 % (ref 39.0–52.0)
Hemoglobin: 14.6 g/dL (ref 13.0–17.0)
Immature Granulocytes: 0 %
Lymphocytes Relative: 30 %
Lymphs Abs: 2.3 K/uL (ref 0.7–4.0)
MCH: 27.5 pg (ref 26.0–34.0)
MCHC: 33.3 g/dL (ref 30.0–36.0)
MCV: 82.6 fL (ref 80.0–100.0)
Monocytes Absolute: 0.5 K/uL (ref 0.1–1.0)
Monocytes Relative: 7 %
Neutro Abs: 4.6 K/uL (ref 1.7–7.7)
Neutrophils Relative %: 57 %
Platelets: 264 K/uL (ref 150–400)
RBC: 5.3 MIL/uL (ref 4.22–5.81)
RDW: 13.2 % (ref 11.5–15.5)
WBC: 7.8 K/uL (ref 4.0–10.5)
nRBC: 0 % (ref 0.0–0.2)

## 2023-12-10 LAB — GLUCOSE, CAPILLARY
Glucose-Capillary: 214 mg/dL — ABNORMAL HIGH (ref 70–99)
Glucose-Capillary: 180 mg/dL — ABNORMAL HIGH (ref 70–99)
Glucose-Capillary: 180 mg/dL — ABNORMAL HIGH (ref 70–99)

## 2023-12-10 LAB — HEMOGLOBIN A1C
Hgb A1c MFr Bld: 9.9 % — ABNORMAL HIGH (ref 4.8–5.6)
Mean Plasma Glucose: 237.43 mg/dL

## 2023-12-10 LAB — ETHANOL: Alcohol, Ethyl (B): <15 mg/dL (ref <15)

## 2023-12-10 LAB — TSH: TSH: 2.337 u[IU]/mL (ref 0.350–4.500)

## 2023-12-10 MED ORDER — ARIPIPRAZOLE 15 MG PO TABS
15.0000 mg | ORAL_TABLET | Freq: Every day | ORAL | Status: DC
  Administered 2023-12-11: 15 mg via ORAL
  Filled 2023-12-10: qty 1
  Filled 2023-12-10: qty 2

## 2023-12-10 MED ORDER — ARIPIPRAZOLE 5 MG PO TABS
5.0000 mg | ORAL_TABLET | ORAL | Status: AC
Start: 2023-12-10 — End: 2023-12-10
  Administered 2023-12-10: 5 mg via ORAL
  Filled 2023-12-10: qty 1

## 2023-12-10 MED ORDER — METFORMIN HCL 500 MG PO TABS
1000.0000 mg | ORAL_TABLET | Freq: Two times a day (BID) | ORAL | Status: DC
  Administered 2023-12-10 – 2023-12-11 (×2): 1000 mg via ORAL
  Filled 2023-12-10: qty 2
  Filled 2023-12-10: qty 8
  Filled 2023-12-10: qty 2

## 2023-12-10 MED ORDER — LISINOPRIL 20 MG PO TABS
40.0000 mg | ORAL_TABLET | Freq: Every day | ORAL | Status: DC
  Administered 2023-12-10 – 2023-12-11 (×2): 40 mg via ORAL
  Filled 2023-12-10: qty 2
  Filled 2023-12-10: qty 4
  Filled 2023-12-10: qty 2

## 2023-12-10 MED ORDER — ARIPIPRAZOLE 10 MG PO TABS
10.0000 mg | ORAL_TABLET | Freq: Every day | ORAL | Status: DC
Start: 2023-12-10 — End: 2023-12-10
  Administered 2023-12-10: 10 mg via ORAL
  Filled 2023-12-10: qty 1

## 2023-12-10 MED ORDER — INSULIN ASPART 100 UNIT/ML IJ SOLN: 0.0000 [IU] | Freq: Every day | INTRAMUSCULAR | Status: DC

## 2023-12-10 MED ORDER — ARIPIPRAZOLE 5 MG PO TABS: 5.0000 mg | ORAL_TABLET | Freq: Every day | ORAL | Status: DC

## 2023-12-10 MED ORDER — SERTRALINE HCL 50 MG PO TABS
150.0000 mg | ORAL_TABLET | Freq: Every day | ORAL | Status: DC
  Administered 2023-12-10 – 2023-12-11 (×2): 150 mg via ORAL
  Filled 2023-12-10 (×8): qty 1

## 2023-12-10 MED ORDER — PANTOPRAZOLE SODIUM 40 MG PO TBEC
40.0000 mg | DELAYED_RELEASE_TABLET | Freq: Every day | ORAL | Status: DC
  Administered 2023-12-10 – 2023-12-11 (×2): 40 mg via ORAL
  Filled 2023-12-10: qty 2
  Filled 2023-12-10 (×2): qty 1

## 2023-12-10 MED ORDER — INSULIN ASPART 100 UNIT/ML IJ SOLN
0.0000 [IU] | Freq: Three times a day (TID) | INTRAMUSCULAR | Status: DC
  Administered 2023-12-10: 3 [IU] via SUBCUTANEOUS
  Administered 2023-12-10: 5 [IU] via SUBCUTANEOUS
  Administered 2023-12-10: 8 [IU] via SUBCUTANEOUS
  Administered 2023-12-11: 5 [IU] via SUBCUTANEOUS

## 2023-12-10 MED ORDER — ATORVASTATIN CALCIUM 40 MG PO TABS
40.0000 mg | ORAL_TABLET | Freq: Every day | ORAL | Status: DC
  Administered 2023-12-10 – 2023-12-11 (×2): 40 mg via ORAL
  Filled 2023-12-10 (×2): qty 1
  Filled 2023-12-10: qty 2

## 2023-12-10 MED ORDER — GLIPIZIDE ER 5 MG PO TB24
5.0000 mg | ORAL_TABLET | Freq: Two times a day (BID) | ORAL | Status: DC
  Administered 2023-12-10 – 2023-12-11 (×3): 5 mg via ORAL
  Filled 2023-12-10: qty 2
  Filled 2023-12-10: qty 4
  Filled 2023-12-10 (×2): qty 2

## 2023-12-10 MED ORDER — ARIPIPRAZOLE 15 MG PO TABS: 15.0000 mg | ORAL_TABLET | Freq: Every day | ORAL | 0 refills | Status: DC
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
## 2023-12-10 NOTE — ED Provider Notes (Addendum)
 Behavioral Health Progress Note  Date and Time: 12/10/2023 1:35 PM Name: [name] MRN:  [id number]  Subjective:  I was depressed and suicidal yesterday  Diagnosis:  Final diagnoses:  MDD (major depressive disorder), recurrent episode, moderate (HCC)   Total Time spent with patient: 30 minutes  [name], 63 y.o., male patient seen face to face by this provider, consulted with Dr. Cole; and chart reviewed on 12/10/23.   [name] is a 63 year old male, with history of MDD, PTSD, and hx of suicidal ideations, presented to [facility] with passive SI and worsening depression after learning yesterday that he was being evicted from this place of residence. Patient reports that he has funds to secure housing in an apartment but came here as he felt overwhelmed. He has recently struggled with depression and was admitted at [facility] last month and discharge 11/18/2023. Endorses compliance with psychiatric medications. Reports that his medication dose has not been adjusted since his last hospital discharge. He has struggled with depression but endorses SI was only yesterday. Denies SI/HI/AH/VH today. Endorses that he is agreeable to a medication increase of Abilify  and thinks, he will be safe for discharge tomorrow after a complete 24 hours observation. 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 Patient is in agreement with this plan.    History per Chart Review: Past Psychiatric History: depression, PTSD, opoid abuse, cannabis abuse, suicidal ideation   Past Medical History: Hypertension, Type 2 Uncontrolled Diabetes-insulin  Dependent  Family Psych History: Father with depression and anxiety, mother with depression Family Hx suicide: maternal grandmother, paternal grandfather Social History:  Developmental Hx: Unknown  Educational Hx: BA in English from [employer]  Occupational Hx: Door Psychologist, forensic Hx: Possession of  Marijuana in [place]  Living Situation: Boarding house in [place]. Spiritual Hx: Unknown Additional Social History:    Pain Medications: see MAR Prescriptions: see MAR Over the Counter: see MAR History of alcohol / drug use?: Yes Longest period of sobriety (when/how long): occasional ETOH use - once every couple of weeks Negative Consequences of Use:  (n/a) Withdrawal Symptoms:  (n/a) Name of Substance 1: n/a 1 - Age of First Use: n/a 1 - Amount (size/oz): n/a 1 - Frequency: n/a 1 - Duration: n/a 1 - Last Use / Amount: n/a 1 - Method of Aquiring: n/a 1- Route of Use: n/a                  Sleep: Good  Appetite:  Good  Current Medications:  Current Facility-Administered Medications  Medication Dose Route Frequency Provider Last Rate Last Admin   acetaminophen  (TYLENOL ) tablet 650 mg  650 mg Oral Q6H PRN Ajibola, Ene A, NP   650 mg at 12/10/23 1016   alum & mag hydroxide-simeth (MAALOX/MYLANTA) 200-200-20 MG/5ML suspension 30 mL  30 mL Oral Q4H PRN Ajibola, Ene A, NP       [START ON 12/11/2023] ARIPiprazole  (ABILIFY ) tablet 15 mg  15 mg Oral Daily Arloa Suzen RAMAN, NP       atorvastatin  (LIPITOR) tablet 40 mg  40 mg Oral Daily Ajibola, Ene A, NP   40 mg at 12/10/23 0939   haloperidol  (HALDOL ) tablet 5 mg  5 mg Oral TID PRN Ajibola, Ene A, NP       And   diphenhydrAMINE  (BENADRYL ) capsule 50 mg  50 mg Oral TID PRN Ajibola, Ene A, NP  haloperidol  lactate (HALDOL ) injection 5 mg  5 mg Intramuscular TID PRN Ajibola, Ene A, NP       And   diphenhydrAMINE  (BENADRYL ) injection 50 mg  50 mg Intramuscular TID PRN Ajibola, Ene A, NP       And   LORazepam  (ATIVAN ) injection 2 mg  2 mg Intramuscular TID PRN Ajibola, Ene A, NP       haloperidol  lactate (HALDOL ) injection 10 mg  10 mg Intramuscular TID PRN Ajibola, Ene A, NP       And   diphenhydrAMINE  (BENADRYL ) injection 50 mg  50 mg Intramuscular TID PRN Ajibola, Ene A, NP       And   LORazepam  (ATIVAN ) injection 2 mg   2 mg Intramuscular TID PRN Ajibola, Ene A, NP       glipiZIDE  (GLUCOTROL  XL) 24 hr tablet 5 mg  5 mg Oral BID AC Ajibola, Ene A, NP   5 mg at 12/10/23 0939   hydrOXYzine  (ATARAX ) tablet 25 mg  25 mg Oral TID PRN Ajibola, Ene A, NP       insulin  aspart (novoLOG ) injection 0-15 Units  0-15 Units Subcutaneous TID WC Ajibola, Ene A, NP   5 Units at 12/10/23 1219   insulin  aspart (novoLOG ) injection 0-5 Units  0-5 Units Subcutaneous QHS Ajibola, Ene A, NP       lisinopril  (ZESTRIL ) tablet 40 mg  40 mg Oral Daily Ajibola, Ene A, NP   40 mg at 12/10/23 0939   magnesium  hydroxide (MILK OF MAGNESIA) suspension 30 mL  30 mL Oral Daily PRN Ajibola, Ene A, NP       metFORMIN  (GLUCOPHAGE ) tablet 1,000 mg  1,000 mg Oral BID WC Ajibola, Ene A, NP       pantoprazole  (PROTONIX ) EC tablet 40 mg  40 mg Oral Daily Ajibola, Ene A, NP   40 mg at 12/10/23 9060   sertraline  (ZOLOFT ) tablet 150 mg  150 mg Oral Daily Ajibola, Ene A, NP   150 mg at 12/10/23 9060   traZODone  (DESYREL ) tablet 50 mg  50 mg Oral QHS PRN Ajibola, Ene A, NP       Current Outpatient Medications  Medication Sig Dispense Refill   acetaminophen  (TYLENOL ) 325 MG tablet Take 650 mg by mouth every 6 (six) hours as needed.     amLODipine  (NORVASC ) 10 MG tablet Take 1 tablet (10 mg total) by mouth daily. 90 tablet 1   glipiZIDE  (GLUCOTROL  XL) 5 MG 24 hr tablet Take 1 tablet (5 mg total) by mouth 2 (two) times daily before a meal. 60 tablet 0   insulin  aspart (NOVOLOG ) 100 UNIT/ML injection Inject 0-5 Units into the skin at bedtime. 10 mL 11   insulin  lispro (HUMALOG  KWIKPEN) 100 UNIT/ML KwikPen Inject 0-15 Units into the skin 3 (three) times daily with meals AND 0-5 Units at bedtime. 15 mL 11   MELATONIN PO Take by mouth at bedtime as needed (sleep).     pantoprazole  (PROTONIX ) 40 MG tablet Take 1 tablet (40 mg total) by mouth daily. 30 tablet 0   prazosin  (MINIPRESS ) 1 MG capsule Take 1 capsule by mouth at bedtime 30 capsule 2   sertraline  (ZOLOFT ) 50  MG tablet Take 3 tablets by mouth daily 90 tablet 2   ARIPiprazole  (ABILIFY ) 15 MG tablet Take 1 tablet by mouth daily for 30 days. Indications: Major Depressive Disorder 30 tablet 0   atorvastatin  (LIPITOR) 40 MG tablet Take 1 tablet (40 mg total) by mouth daily.  90 tablet 2   lisinopril  (ZESTRIL ) 40 MG tablet Take 1 tablet (40 mg total) by mouth daily. (Patient not taking: Reported on 12/10/2023) 30 tablet 0   metFORMIN  (GLUCOPHAGE ) 1000 MG tablet Take 1 tablet (1,000 mg total) by mouth 2 (two) times daily with a meal. 60 tablet 0   traZODone  (DESYREL ) 50 MG tablet Take 1 tablet (50 mg total) by mouth at bedtime as needed for sleep. 30 tablet 2    Labs  Lab Results:  Admission on 12/09/2023  Component Date Value Ref Range Status   WBC 12/10/2023 7.8  4.0 - 10.5 K/uL Final   RBC 12/10/2023 5.30  4.22 - 5.81 MIL/uL Final   Hemoglobin 12/10/2023 14.6  13.0 - 17.0 g/dL Final   HCT 90/86/7974 43.8  39.0 - 52.0 % Final   MCV 12/10/2023 82.6  80.0 - 100.0 fL Final   MCH 12/10/2023 27.5  26.0 - 34.0 pg Final   MCHC 12/10/2023 33.3  30.0 - 36.0 g/dL Final   RDW 90/86/7974 13.2  11.5 - 15.5 % Final   Platelets 12/10/2023 264  150 - 400 K/uL Final   nRBC 12/10/2023 0.0  0.0 - 0.2 % Final   Neutrophils Relative % 12/10/2023 57  % Final   Neutro Abs 12/10/2023 4.6  1.7 - 7.7 K/uL Final   Lymphocytes Relative 12/10/2023 30  % Final   Lymphs Abs 12/10/2023 2.3  0.7 - 4.0 K/uL Final   Monocytes Relative 12/10/2023 7  % Final   Monocytes Absolute 12/10/2023 0.5  0.1 - 1.0 K/uL Final   Eosinophils Relative 12/10/2023 5  % Final   Eosinophils Absolute 12/10/2023 0.4  0.0 - 0.5 K/uL Final   Basophils Relative 12/10/2023 1  % Final   Basophils Absolute 12/10/2023 0.0  0.0 - 0.1 K/uL Final   Immature Granulocytes 12/10/2023 0  % Final   Abs Immature Granulocytes 12/10/2023 0.02  0.00 - 0.07 K/uL Final   Performed at [facility], [address], [place], [place] [address]   Sodium 12/10/2023 137   135 - 145 mmol/L Final   Potassium 12/10/2023 4.0  3.5 - 5.1 mmol/L Final   Chloride 12/10/2023 99  98 - 111 mmol/L Final   CO2 12/10/2023 23  22 - 32 mmol/L Final   Glucose, Bld 12/10/2023 213 (H)  70 - 99 mg/dL Final   Glucose reference range applies only to samples taken after fasting for at least 8 hours.   BUN 12/10/2023 13  8 - 23 mg/dL Final   Creatinine, Ser 12/10/2023 1.03  0.61 - 1.24 mg/dL Final   Calcium  12/10/2023 9.3  8.9 - 10.3 mg/dL Final   Total Protein 90/86/7974 6.9  6.5 - 8.1 g/dL Final   Albumin 90/86/7974 3.9  3.5 - 5.0 g/dL Final   AST 90/86/7974 23  15 - 41 U/L Final   ALT 12/10/2023 27  0 - 44 U/L Final   Alkaline Phosphatase 12/10/2023 53  38 - 126 U/L Final   Total Bilirubin 12/10/2023 0.5  0.0 - 1.2 mg/dL Final   GFR, Estimated 12/10/2023 >60  >60 mL/min Final   Comment: (NOTE) Calculated using the CKD-EPI Creatinine Equation (2021)    Anion gap 12/10/2023 15  5 - 15 Final   Performed at [facility], [address] [place], [place] [address]   Hgb A1c MFr Bld 12/10/2023 9.9 (H)  4.8 - 5.6 % Final   Comment: (NOTE) Diagnosis of Diabetes The following HbA1c ranges  recommended by the American Diabetes Association (ADA) may be used as an aid in the diagnosis of diabetes mellitus.  Hemoglobin             Suggested A1C NGSP%              Diagnosis  <5.7                   Non Diabetic  5.7-6.4                Pre-Diabetic  >6.4                   Diabetic  <7.0                   Glycemic control for                       adults with diabetes.     Mean Plasma Glucose 12/10/2023 237.43  mg/dL Final   Performed at [facility], [address] [place], [place] [address]   Alcohol, Ethyl (B) 12/10/2023 <15  <15 mg/dL Final   Comment: (NOTE) For medical purposes only. Performed at [facility], [address] [place], [place] [address]    TSH 12/10/2023 2.337  0.350 - 4.500 uIU/mL Final   Comment: Performed by a 3rd Generation assay  with a functional sensitivity of <=0.01 uIU/mL. Performed at [facility], [address] [place], [place] [address]    POC Amphetamine UR 12/10/2023 None Detected  NONE DETECTED (Cut Off Level 1000 ng/mL) Final   POC Secobarbital (BAR) 12/10/2023 None Detected  NONE DETECTED (Cut Off Level 300 ng/mL) Final   POC Buprenorphine (BUP) 12/10/2023 None Detected  NONE DETECTED (Cut Off Level 10 ng/mL) Final   POC Oxazepam (BZO) 12/10/2023 None Detected  NONE DETECTED (Cut Off Level 300 ng/mL) Final   POC Cocaine UR 12/10/2023 None Detected  NONE DETECTED (Cut Off Level 300 ng/mL) Final   POC Methamphetamine UR 12/10/2023 None Detected  NONE DETECTED (Cut Off Level 1000 ng/mL) Final   POC Morphine 12/10/2023 None Detected  NONE DETECTED (Cut Off Level 300 ng/mL) Final   POC Methadone UR 12/10/2023 None Detected  NONE DETECTED (Cut Off Level 300 ng/mL) Final   POC Oxycodone  UR 12/10/2023 None Detected  NONE DETECTED (Cut Off Level 100 ng/mL) Final   POC Marijuana UR 12/10/2023 Positive (A)  NONE DETECTED (Cut Off Level 50 ng/mL) Final   Glucose-Capillary 12/10/2023 214 (H)  70 - 99 mg/dL Final   Glucose reference range applies only to samples taken after fasting for at least 8 hours.  Admission on 11/06/2023, Discharged on 11/17/2023  Component Date Value Ref Range Status   Glucose-Capillary 11/07/2023 365 (H)  70 - 99 mg/dL Final   Glucose reference range applies only to samples taken after fasting for at least 8 hours.   Glucose-Capillary 11/07/2023 268 (H)  70 - 99 mg/dL Final   Glucose reference range applies only to samples taken after fasting for at least 8 hours.   Glucose-Capillary 11/07/2023 144 (H)  70 - 99 mg/dL Final   Glucose reference range applies only to samples taken after fasting for at least 8 hours.   Glucose-Capillary 11/07/2023 149 (H)  70 - 99 mg/dL Final   Glucose reference range applies only to samples taken after fasting for at least 8 hours.   Glucose-Capillary  11/08/2023 227 (H)  70 - 99  mg/dL Final   Glucose reference range applies only to samples taken after fasting for at least 8 hours.   Glucose-Capillary 11/08/2023 228 (H)  70 - 99 mg/dL Final   Glucose reference range applies only to samples taken after fasting for at least 8 hours.   Comment 1 11/08/2023 Notify RN   Final   Glucose-Capillary 11/08/2023 153 (H)  70 - 99 mg/dL Final   Glucose reference range applies only to samples taken after fasting for at least 8 hours.   Glucose-Capillary 11/08/2023 130 (H)  70 - 99 mg/dL Final   Glucose reference range applies only to samples taken after fasting for at least 8 hours.   Comment 1 11/08/2023 Notify RN   Final   Glucose-Capillary 11/09/2023 297 (H)  70 - 99 mg/dL Final   Glucose reference range applies only to samples taken after fasting for at least 8 hours.   Glucose-Capillary 11/09/2023 286 (H)  70 - 99 mg/dL Final   Glucose reference range applies only to samples taken after fasting for at least 8 hours.   Glucose-Capillary 11/09/2023 196 (H)  70 - 99 mg/dL Final   Glucose reference range applies only to samples taken after fasting for at least 8 hours.   Glucose-Capillary 11/09/2023 309 (H)  70 - 99 mg/dL Final   Glucose reference range applies only to samples taken after fasting for at least 8 hours.   Glucose-Capillary 11/10/2023 157 (H)  70 - 99 mg/dL Final   Glucose reference range applies only to samples taken after fasting for at least 8 hours.   Glucose-Capillary 11/10/2023 270 (H)  70 - 99 mg/dL Final   Glucose reference range applies only to samples taken after fasting for at least 8 hours.   Comment 1 11/10/2023 Notify RN   Final   Glucose-Capillary 11/10/2023 138 (H)  70 - 99 mg/dL Final   Glucose reference range applies only to samples taken after fasting for at least 8 hours.   Glucose-Capillary 11/10/2023 208 (H)  70 - 99 mg/dL Final   Glucose reference range applies only to samples taken after fasting for at least 8  hours.   Glucose-Capillary 11/11/2023 195 (H)  70 - 99 mg/dL Final   Glucose reference range applies only to samples taken after fasting for at least 8 hours.   Comment 1 11/11/2023 Notify RN   Final   Glucose-Capillary 11/11/2023 243 (H)  70 - 99 mg/dL Final   Glucose reference range applies only to samples taken after fasting for at least 8 hours.   Glucose-Capillary 11/11/2023 136 (H)  70 - 99 mg/dL Final   Glucose reference range applies only to samples taken after fasting for at least 8 hours.   Glucose-Capillary 11/11/2023 187 (H)  70 - 99 mg/dL Final   Glucose reference range applies only to samples taken after fasting for at least 8 hours.   Glucose-Capillary 11/12/2023 194 (H)  70 - 99 mg/dL Final   Glucose reference range applies only to samples taken after fasting for at least 8 hours.   Comment 1 11/12/2023 Notify RN   Final   Glucose-Capillary 11/12/2023 207 (H)  70 - 99 mg/dL Final   Glucose reference range applies only to samples taken after fasting for at least 8 hours.   Glucose-Capillary 11/12/2023 192 (H)  70 - 99 mg/dL Final   Glucose reference range applies only to samples taken after fasting for at least 8 hours.   Glucose-Capillary 11/12/2023 170 (H)  70 -  99 mg/dL Final   Glucose reference range applies only to samples taken after fasting for at least 8 hours.   Glucose-Capillary 11/13/2023 188 (H)  70 - 99 mg/dL Final   Glucose reference range applies only to samples taken after fasting for at least 8 hours.   Glucose-Capillary 11/13/2023 256 (H)  70 - 99 mg/dL Final   Glucose reference range applies only to samples taken after fasting for at least 8 hours.   Glucose-Capillary 11/13/2023 171 (H)  70 - 99 mg/dL Final   Glucose reference range applies only to samples taken after fasting for at least 8 hours.   Glucose-Capillary 11/13/2023 234 (H)  70 - 99 mg/dL Final   Glucose reference range applies only to samples taken after fasting for at least 8 hours.    Glucose-Capillary 11/14/2023 170 (H)  70 - 99 mg/dL Final   Glucose reference range applies only to samples taken after fasting for at least 8 hours.   Glucose-Capillary 11/14/2023 224 (H)  70 - 99 mg/dL Final   Glucose reference range applies only to samples taken after fasting for at least 8 hours.   Glucose-Capillary 11/14/2023 242 (H)  70 - 99 mg/dL Final   Glucose reference range applies only to samples taken after fasting for at least 8 hours.   Glucose-Capillary 11/14/2023 209 (H)  70 - 99 mg/dL Final   Glucose reference range applies only to samples taken after fasting for at least 8 hours.   Glucose-Capillary 11/15/2023 222 (H)  70 - 99 mg/dL Final   Glucose reference range applies only to samples taken after fasting for at least 8 hours.   Glucose-Capillary 11/15/2023 177 (H)  70 - 99 mg/dL Final   Glucose reference range applies only to samples taken after fasting for at least 8 hours.   Glucose-Capillary 11/15/2023 122 (H)  70 - 99 mg/dL Final   Glucose reference range applies only to samples taken after fasting for at least 8 hours.   Glucose-Capillary 11/15/2023 217 (H)  70 - 99 mg/dL Final   Glucose reference range applies only to samples taken after fasting for at least 8 hours.   Comment 1 11/15/2023 Notify RN   Final   Glucose-Capillary 11/16/2023 187 (H)  70 - 99 mg/dL Final   Glucose reference range applies only to samples taken after fasting for at least 8 hours.   Comment 1 11/16/2023 Notify RN   Final   Glucose-Capillary 11/16/2023 242 (H)  70 - 99 mg/dL Final   Glucose reference range applies only to samples taken after fasting for at least 8 hours.   Glucose-Capillary 11/16/2023 215 (H)  70 - 99 mg/dL Final   Glucose reference range applies only to samples taken after fasting for at least 8 hours.   Comment 1 11/16/2023 Notify RN   Final   Glucose-Capillary 11/16/2023 157 (H)  70 - 99 mg/dL Final   Glucose reference range applies only to samples taken after fasting  for at least 8 hours.   Glucose-Capillary 11/17/2023 195 (H)  70 - 99 mg/dL Final   Glucose reference range applies only to samples taken after fasting for at least 8 hours.   Comment 1 11/17/2023 Notify RN   Final  Admission on 11/06/2023, Discharged on 11/06/2023  Component Date Value Ref Range Status   SARS Coronavirus 2 by RT PCR 11/06/2023 NEGATIVE  NEGATIVE Final   Performed at [facility], [address], [place], [place] [address]   WBC  11/06/2023 6.6  4.0 - 10.5 K/uL Final   RBC 11/06/2023 5.32  4.22 - 5.81 MIL/uL Final   Hemoglobin 11/06/2023 14.6  13.0 - 17.0 g/dL Final   HCT 91/89/7974 42.9  39.0 - 52.0 % Final   MCV 11/06/2023 80.6  80.0 - 100.0 fL Final   MCH 11/06/2023 27.4  26.0 - 34.0 pg Final   MCHC 11/06/2023 34.0  30.0 - 36.0 g/dL Final   RDW 91/89/7974 12.9  11.5 - 15.5 % Final   Platelets 11/06/2023 289  150 - 400 K/uL Final   nRBC 11/06/2023 0.0  0.0 - 0.2 % Final   Neutrophils Relative % 11/06/2023 66  % Final   Neutro Abs 11/06/2023 4.4  1.7 - 7.7 K/uL Final   Lymphocytes Relative 11/06/2023 24  % Final   Lymphs Abs 11/06/2023 1.6  0.7 - 4.0 K/uL Final   Monocytes Relative 11/06/2023 5  % Final   Monocytes Absolute 11/06/2023 0.4  0.1 - 1.0 K/uL Final   Eosinophils Relative 11/06/2023 3  % Final   Eosinophils Absolute 11/06/2023 0.2  0.0 - 0.5 K/uL Final   Basophils Relative 11/06/2023 1  % Final   Basophils Absolute 11/06/2023 0.0  0.0 - 0.1 K/uL Final   Immature Granulocytes 11/06/2023 1  % Final   Abs Immature Granulocytes 11/06/2023 0.03  0.00 - 0.07 K/uL Final   Performed at [facility], [address], [place], [place] [address]   Sodium 11/06/2023 129 (L)  135 - 145 mmol/L Final   Potassium 11/06/2023 4.1  3.5 - 5.1 mmol/L Final   Chloride 11/06/2023 93 (L)  98 - 111 mmol/L Final   CO2 11/06/2023 21 (L)  22 - 32 mmol/L Final   Glucose, Bld 11/06/2023 399 (H)  70 - 99 mg/dL Final   Glucose reference range applies only to samples taken  after fasting for at least 8 hours.   BUN 11/06/2023 12  8 - 23 mg/dL Final   Creatinine, Ser 11/06/2023 0.92  0.61 - 1.24 mg/dL Final   Calcium  11/06/2023 9.7  8.9 - 10.3 mg/dL Final   Total Protein 91/89/7974 7.2  6.5 - 8.1 g/dL Final   Albumin 91/89/7974 4.0  3.5 - 5.0 g/dL Final   AST 91/89/7974 16  15 - 41 U/L Final   ALT 11/06/2023 20  0 - 44 U/L Final   Alkaline Phosphatase 11/06/2023 54  38 - 126 U/L Final   Total Bilirubin 11/06/2023 0.8  0.0 - 1.2 mg/dL Final   GFR, Estimated 11/06/2023 >60  >60 mL/min Final   Comment: (NOTE) Calculated using the CKD-EPI Creatinine Equation (2021)    Anion gap 11/06/2023 15  5 - 15 Final   Performed at [facility], [address] [place], [place] [address]   Hgb A1c MFr Bld 11/06/2023 10.6 (H)  4.8 - 5.6 % Final   Comment: (NOTE)         Prediabetes: 5.7 - 6.4         Diabetes: >6.4         Glycemic control for adults with diabetes: <7.0    Mean Plasma Glucose 11/06/2023 258  mg/dL Final   Comment: (NOTE) Performed At: [facility] [address] [place], [place] [id number] Jennette Shorter MD Ey:[id number]    Magnesium  11/06/2023 2.0  1.7 - 2.4 mg/dL Final   Performed at [facility], [address], [place], [place] [address]   Alcohol, Ethyl (B) 11/06/2023 <15  <  15 mg/dL Final   Comment: (NOTE) For medical purposes only. Performed at [facility], [address], [place], [place] [address]    Cholesterol 11/06/2023 279 (H)  0 - 200 mg/dL Final   Triglycerides 91/89/7974 282 (H)  <150 mg/dL Final   HDL 91/89/7974 60  >40 mg/dL Final   Total CHOL/HDL Ratio 11/06/2023 4.7  RATIO Final   VLDL 11/06/2023 56 (H)  0 - 40 mg/dL Final   LDL Cholesterol 11/06/2023 163 (H)  0 - 99 mg/dL Final   Comment:        Total Cholesterol/HDL:CHD Risk Coronary Heart Disease Risk Table                     Men   Women  1/2 Average Risk   3.4   3.3  Average Risk       5.0   4.4  2 X Average Risk   9.6   7.1  3 X Average  Risk  23.4   11.0        Use the calculated Patient Ratio above and the CHD Risk Table to determine the patient's CHD Risk.        ATP III CLASSIFICATION (LDL):  <100     mg/dL   Optimal  899-870  mg/dL   Near or Above                    Optimal  130-159  mg/dL   Borderline  839-810  mg/dL   High  >809     mg/dL   Very High Performed at [facility], [address] [place], [place] [address]    TSH 11/06/2023 1.028  0.350 - 4.500 uIU/mL Final   Comment: Performed by a 3rd Generation assay with a functional sensitivity of <=0.01 uIU/mL. Performed at [facility], [address], [place], [place] [address]    RPR Ser Ql 11/06/2023 NON REACTIVE  NON REACTIVE Final   Performed at [facility], [address], [place], [place] [address]   Color, Urine 11/06/2023 YELLOW  YELLOW Final   APPearance 11/06/2023 CLEAR  CLEAR Final   Specific Gravity, Urine 11/06/2023 1.017  1.005 - 1.030 Final   pH 11/06/2023 6.0  5.0 - 8.0 Final   Glucose, UA 11/06/2023 >=500 (A)  NEGATIVE mg/dL Final   Hgb urine dipstick 11/06/2023 NEGATIVE  NEGATIVE Final   Bilirubin Urine 11/06/2023 NEGATIVE  NEGATIVE Final   Ketones, ur 11/06/2023 NEGATIVE  NEGATIVE mg/dL Final   Protein, ur 91/89/7974 NEGATIVE  NEGATIVE mg/dL Final   Nitrite 91/89/7974 NEGATIVE  NEGATIVE Final   Leukocytes,Ua 11/06/2023 NEGATIVE  NEGATIVE Final   RBC / HPF 11/06/2023 0-5  0 - 5 RBC/hpf Final   WBC, UA 11/06/2023 0-5  0 - 5 WBC/hpf Final   Bacteria, UA 11/06/2023 NONE SEEN  NONE SEEN Final   Squamous Epithelial / HPF 11/06/2023 0-5  0 - 5 /HPF Final   Performed at [facility], [address] [place], [place] [address]   POC Amphetamine UR 11/06/2023 None Detected  NONE DETECTED (Cut Off Level 1000 ng/mL) Final   POC Secobarbital (BAR) 11/06/2023 None Detected  NONE DETECTED (Cut Off Level 300 ng/mL) Final   POC Buprenorphine (BUP) 11/06/2023 None Detected  NONE DETECTED (Cut Off Level 10 ng/mL) Final   POC  Oxazepam (BZO) 11/06/2023 None Detected  NONE DETECTED (Cut Off Level 300 ng/mL) Final  POC Cocaine UR 11/06/2023 None Detected  NONE DETECTED (Cut Off Level 300 ng/mL) Final   POC Methamphetamine UR 11/06/2023 None Detected  NONE DETECTED (Cut Off Level 1000 ng/mL) Final   POC Morphine 11/06/2023 None Detected  NONE DETECTED (Cut Off Level 300 ng/mL) Final   POC Methadone UR 11/06/2023 None Detected  NONE DETECTED (Cut Off Level 300 ng/mL) Final   POC Oxycodone  UR 11/06/2023 None Detected  NONE DETECTED (Cut Off Level 100 ng/mL) Final   POC Marijuana UR 11/06/2023 None Detected  NONE DETECTED (Cut Off Level 50 ng/mL) Final   HIV Screen 4th Generation wRfx 11/06/2023 Non Reactive  Non Reactive Final   Performed at [facility], [address] [place], [place] [address]   Glucose-Capillary 11/06/2023 351 (H)  70 - 99 mg/dL Final   Glucose reference range applies only to samples taken after fasting for at least 8 hours.   Glucose-Capillary 11/06/2023 373 (H)  70 - 99 mg/dL Final   Glucose reference range applies only to samples taken after fasting for at least 8 hours.   Glucose-Capillary 11/06/2023 269 (H)  70 - 99 mg/dL Final   Glucose reference range applies only to samples taken after fasting for at least 8 hours.   Glucose-Capillary 11/06/2023 160 (H)  70 - 99 mg/dL Final   Glucose reference range applies only to samples taken after fasting for at least 8 hours.  Admission on 08/26/2023, Discharged on 08/31/2023  Component Date Value Ref Range Status   Glucose-Capillary 08/28/2023 163 (H)  70 - 99 mg/dL Final   Glucose reference range applies only to samples taken after fasting for at least 8 hours.   Glucose-Capillary 08/28/2023 151 (H)  70 - 99 mg/dL Final   Glucose reference range applies only to samples taken after fasting for at least 8 hours.   Glucose-Capillary 08/28/2023 126 (H)  70 - 99 mg/dL Final   Glucose reference range applies only to samples taken after fasting for  at least 8 hours.   Comment 1 08/28/2023 Notify RN   Final   Glucose-Capillary 08/29/2023 191 (H)  70 - 99 mg/dL Final   Glucose reference range applies only to samples taken after fasting for at least 8 hours.   Comment 1 08/29/2023 Notify RN   Final   Glucose-Capillary 08/29/2023 177 (H)  70 - 99 mg/dL Final   Glucose reference range applies only to samples taken after fasting for at least 8 hours.   Glucose-Capillary 08/29/2023 157 (H)  70 - 99 mg/dL Final   Glucose reference range applies only to samples taken after fasting for at least 8 hours.   Glucose-Capillary 08/29/2023 168 (H)  70 - 99 mg/dL Final   Glucose reference range applies only to samples taken after fasting for at least 8 hours.   Comment 1 08/29/2023 Notify RN   Final   Glucose-Capillary 08/30/2023 215 (H)  70 - 99 mg/dL Final   Glucose reference range applies only to samples taken after fasting for at least 8 hours.   Glucose-Capillary 08/30/2023 220 (H)  70 - 99 mg/dL Final   Glucose reference range applies only to samples taken after fasting for at least 8 hours.   Glucose-Capillary 08/30/2023 161 (H)  70 - 99 mg/dL Final   Glucose reference range applies only to samples taken after fasting for at least 8 hours.   Glucose-Capillary 08/31/2023 155 (H)  70 - 99 mg/dL Final   Glucose reference range applies only to samples  taken after fasting for at least 8 hours.   Comment 1 08/31/2023 Notify RN   Final   Comment 2 08/31/2023 Document in Chart   Final  Admission on 08/25/2023, Discharged on 08/26/2023  Component Date Value Ref Range Status   WBC 08/25/2023 9.0  4.0 - 10.5 K/uL Final   RBC 08/25/2023 5.38  4.22 - 5.81 MIL/uL Final   Hemoglobin 08/25/2023 14.8  13.0 - 17.0 g/dL Final   HCT 94/70/7974 43.9  39.0 - 52.0 % Final   MCV 08/25/2023 81.6  80.0 - 100.0 fL Final   MCH 08/25/2023 27.5  26.0 - 34.0 pg Final   MCHC 08/25/2023 33.7  30.0 - 36.0 g/dL Final   RDW 94/70/7974 13.1  11.5 - 15.5 % Final   Platelets  08/25/2023 282  150 - 400 K/uL Final   nRBC 08/25/2023 0.0  0.0 - 0.2 % Final   Neutrophils Relative % 08/25/2023 68  % Final   Neutro Abs 08/25/2023 6.1  1.7 - 7.7 K/uL Final   Lymphocytes Relative 08/25/2023 23  % Final   Lymphs Abs 08/25/2023 2.1  0.7 - 4.0 K/uL Final   Monocytes Relative 08/25/2023 5  % Final   Monocytes Absolute 08/25/2023 0.5  0.1 - 1.0 K/uL Final   Eosinophils Relative 08/25/2023 3  % Final   Eosinophils Absolute 08/25/2023 0.3  0.0 - 0.5 K/uL Final   Basophils Relative 08/25/2023 1  % Final   Basophils Absolute 08/25/2023 0.1  0.0 - 0.1 K/uL Final   Immature Granulocytes 08/25/2023 0  % Final   Abs Immature Granulocytes 08/25/2023 0.02  0.00 - 0.07 K/uL Final   Performed at [facility], [address], [place], [place] [address]   Sodium 08/25/2023 136  135 - 145 mmol/L Final   Potassium 08/25/2023 3.9  3.5 - 5.1 mmol/L Final   Chloride 08/25/2023 102  98 - 111 mmol/L Final   CO2 08/25/2023 21 (L)  22 - 32 mmol/L Final   Glucose, Bld 08/25/2023 178 (H)  70 - 99 mg/dL Final   Glucose reference range applies only to samples taken after fasting for at least 8 hours.   BUN 08/25/2023 13  8 - 23 mg/dL Final   Creatinine, Ser 08/25/2023 1.14  0.61 - 1.24 mg/dL Final   Calcium  08/25/2023 9.7  8.9 - 10.3 mg/dL Final   Total Protein 94/70/7974 7.4  6.5 - 8.1 g/dL Final   Albumin 94/70/7974 4.2  3.5 - 5.0 g/dL Final   AST 94/70/7974 29  15 - 41 U/L Final   ALT 08/25/2023 30  0 - 44 U/L Final   Alkaline Phosphatase 08/25/2023 53  38 - 126 U/L Final   Total Bilirubin 08/25/2023 0.6  0.0 - 1.2 mg/dL Final   GFR, Estimated 08/25/2023 >60  >60 mL/min Final   Comment: (NOTE) Calculated using the CKD-EPI Creatinine Equation (2021)    Anion gap 08/25/2023 13  5 - 15 Final   Performed at [facility], [address], [place], [place] [address]   Hgb A1c MFr Bld 08/25/2023 9.4 (H)  4.8 - 5.6 % Final   Comment: (NOTE) Diagnosis of Diabetes The following HbA1c  ranges recommended by the American Diabetes Association (ADA) may be used as an aid in the diagnosis of diabetes mellitus.  Hemoglobin             Suggested A1C NGSP%              Diagnosis  <  5.7                   Non Diabetic  5.7-6.4                Pre-Diabetic  >6.4                   Diabetic  <7.0                   Glycemic control for                       adults with diabetes.     Mean Plasma Glucose 08/25/2023 223.08  mg/dL Final   Performed at [facility], [address], [place], [place] [address]   Magnesium  08/25/2023 1.9  1.7 - 2.4 mg/dL Final   Performed at [facility], [address], [place], [place] [address]   Alcohol, Ethyl (B) 08/25/2023 <15  <15 mg/dL Final   Comment: (NOTE) For medical purposes only. Performed at [facility], [address] [place], [place] [address]    Cholesterol 08/25/2023 138  0 - 200 mg/dL Final   Triglycerides 94/70/7974 201 (H)  <150 mg/dL Final   HDL 94/70/7974 41  >40 mg/dL Final   Total CHOL/HDL Ratio 08/25/2023 3.4  RATIO Final   VLDL 08/25/2023 40  0 - 40 mg/dL Final   LDL Cholesterol 08/25/2023 57  0 - 99 mg/dL Final   Comment:        Total Cholesterol/HDL:CHD Risk Coronary Heart Disease Risk Table                     Men   Women  1/2 Average Risk   3.4   3.3  Average Risk       5.0   4.4  2 X Average Risk   9.6   7.1  3 X Average Risk  23.4   11.0        Use the calculated Patient Ratio above and the CHD Risk Table to determine the patient's CHD Risk.        ATP III CLASSIFICATION (LDL):  <100     mg/dL   Optimal  899-870  mg/dL   Near or Above                    Optimal  130-159  mg/dL   Borderline  839-810  mg/dL   High  >809     mg/dL   Very High Performed at [facility], [address], [place], [place] [address]    TSH 08/25/2023 1.049  0.350 - 4.500 uIU/mL Final   Comment: Performed by a 3rd Generation assay with a functional sensitivity of <=0.01 uIU/mL. Performed at [facility], [address], [place], [place] [address]    Color, Urine 08/25/2023 YELLOW  YELLOW Final   APPearance 08/25/2023 TURBID (A)  CLEAR Final   Specific Gravity, Urine 08/25/2023 1.028  1.005 - 1.030 Final   pH 08/25/2023 5.0  5.0 - 8.0 Final   Glucose, UA 08/25/2023 50 (A)  NEGATIVE mg/dL Final   Hgb urine dipstick 08/25/2023 NEGATIVE  NEGATIVE Final   Bilirubin Urine 08/25/2023 NEGATIVE  NEGATIVE Final   Ketones, ur 08/25/2023 5 (A)  NEGATIVE mg/dL Final   Protein, ur 94/70/7974 30 (A)  NEGATIVE mg/dL Final   Nitrite 94/70/7974 NEGATIVE  NEGATIVE Final  Leukocytes,Ua 08/25/2023 NEGATIVE  NEGATIVE Final   RBC / HPF 08/25/2023 6-10  0 - 5 RBC/hpf Final   WBC, UA 08/25/2023 0-5  0 - 5 WBC/hpf Final   Bacteria, UA 08/25/2023 FEW (A)  NONE SEEN Final   Squamous Epithelial / HPF 08/25/2023 0-5  0 - 5 /HPF Final   Mucus 08/25/2023 PRESENT   Final   Performed at [facility], [address], [place], [place] [address]   POC Amphetamine UR 08/25/2023 None Detected  NONE DETECTED (Cut Off Level 1000 ng/mL) Final   POC Secobarbital (BAR) 08/25/2023 None Detected  NONE DETECTED (Cut Off Level 300 ng/mL) Final   POC Buprenorphine (BUP) 08/25/2023 None Detected  NONE DETECTED (Cut Off Level 10 ng/mL) Final   POC Oxazepam (BZO) 08/25/2023 None Detected  NONE DETECTED (Cut Off Level 300 ng/mL) Final   POC Cocaine UR 08/25/2023 None Detected  NONE DETECTED (Cut Off Level 300 ng/mL) Final   POC Methamphetamine UR 08/25/2023 None Detected  NONE DETECTED (Cut Off Level 1000 ng/mL) Final   POC Morphine 08/25/2023 None Detected  NONE DETECTED (Cut Off Level 300 ng/mL) Final   POC Methadone UR 08/25/2023 None Detected  NONE DETECTED (Cut Off Level 300 ng/mL) Final   POC Oxycodone  UR 08/25/2023 None Detected  NONE DETECTED (Cut Off Level 100 ng/mL) Final   POC Marijuana UR 08/25/2023 None Detected  NONE DETECTED (Cut Off Level 50 ng/mL) Final    Blood Alcohol level:  Lab Results   Component Value Date   [facility] <15 12/10/2023   ETH <15 11/06/2023    Metabolic Disorder Labs: Lab Results  Component Value Date   HGBA1C 9.9 (H) 12/10/2023   MPG 237.43 12/10/2023   MPG 258 11/06/2023   No results found for: PROLACTIN Lab Results  Component Value Date   CHOL 279 (H) 11/06/2023   TRIG 282 (H) 11/06/2023   HDL 60 11/06/2023   CHOLHDL 4.7 11/06/2023   VLDL 56 (H) 11/06/2023   LDLCALC 163 (H) 11/06/2023   LDLCALC 57 08/25/2023    Therapeutic Lab Levels: No results found for: LITHIUM No results found for: VALPROATE No results found for: CBMZ  Physical Findings   AIMS    Flowsheet Row [facility]  AIMS Total Score 0   AUDIT    Flowsheet Row Admission (Discharged) from 11/06/2023 in [facility] Ad[facility] (Dis[facility] from 03[facility]10/2023 in [facility]  Alcohol Use Disorder Identification Test Final Score (AUDIT) 0 0 10    GAD-7    Flowsheet Row Office Visit from 03/10/2023 in [facility] [place]  Total GAD-7 Score 5   PHQ2-9    Flowsheet Row Office Visit from 03/10/2023 in [facility] [place] ED from 02/22/2023 in [facility] Office Visit from 05/04/2021 in [facility] [place] Video Visit from 11/20/2020 in [facility] [place] Office Visit from 07/15/2020 in [facility] [place]  PHQ-2 Total Score 2 4 1 3 2   PHQ-9 Total Score 13 14 2 5 8    Flowsheet Row ED from 12/09/2023 in [facility] Most recent reading at 12/10/2023  1:54 AM Admission (Discharged) from 11/06/2023 in [facility]PATIENT BE[facility]AL MEDICINE Most recent reading  at 11/06/2023 10:00 PM  ED from 11/06/2023 in [facility] Most recent reading at 11/06/2023 11:48 AM  C-SSRS RISK CATEGORY High Risk High Risk High Risk     Musculoskeletal  Strength & Muscle Tone: within normal limits Gait & Station: normal Patient leans: N/A  Psychiatric Specialty Exam  Presentation  General Appearance:  Casual  Eye Contact: Good  Speech: Clear and Coherent  Speech Volume: Normal  Handedness: Right   Mood and Affect  Mood: Depressed  Affect: Congruent   Thought Process  Thought Processes: Coherent  Descriptions of Associations:Intact  Orientation:Full (Time, Place and Person)  Thought Content:WDL  Diagnosis of Schizophrenia or Schizoaffective disorder in past: No    Hallucinations:Hallucinations: None Description of Auditory Hallucinations: you are a burden, you're worthless, you got to do it now  Ideas of Reference:None  Suicidal Thoughts:Suicidal Thoughts: No SI Active Intent and/or Plan: Without Intent; Without Plan  Homicidal Thoughts:Homicidal Thoughts: No   Sensorium  Memory: Immediate Good; Remote Good; Recent Good  Judgment: Fair  Insight: Fair   Chartered certified accountant: Fair  Attention Span: Fair  Recall: Fair  Fund of Knowledge: Good  Language: Good   Psychomotor Activity  Psychomotor Activity: Psychomotor Activity: Normal   Assets  Assets: Communication Skills; Desire for Improvement; Physical Health   Sleep  Sleep: Sleep: Fair  No Safety Checks orders active in given range  Nutritional Assessment (For OBS and [facility]BC admissions only) Has the patient had a weight loss or gain of 10 pounds or more in the last 3 months?: No Has the patient had a decrease in food intake/or appetite?: No Does the patient have eating habits or behaviors that may be indicators of an eating disorder including binging or inducing vomiting?: No Has the patient recently lost weight without trying?:  0 Has the patient been eating poorly because of a decreased appetite?: 0 Malnutrition Screening Tool Score: 0    Physical Exam  Physical Exam Constitutional:      General: He is not in acute distress.    Appearance: Normal appearance. He is not ill-appearing.  HENT:     Head: Normocephalic and atraumatic.     Nose: Nose normal.  Eyes:     Extraocular Movements: Extraocular movements intact.     Conjunctiva/sclera: Conjunctivae normal.     Pupils: Pupils are equal, round, and reactive to light.  Cardiovascular:     Rate and Rhythm: Normal rate and regular rhythm.  Pulmonary:     Effort: Pulmonary effort is normal.     Breath sounds: Normal breath sounds.  Musculoskeletal:     Cervical back: Normal range of motion and neck supple.  Skin:    General: Skin is warm and dry.  Neurological:     General: No focal deficit present.     Mental Status: He is alert and oriented to person, place, and time.     Review of Systems  Psychiatric/Behavioral:  Positive for depression.    Blood pressure (!) 155/88, pulse 74, temperature 98.4 F (36.9 C), resp. rate 18, SpO2 98%. There is no height or weight on file to calculate BMI.  Treatment Plan Summary: Daily contact with patient to assess and evaluate symptoms and progress in treatment, Medication management, and Plan :Observe for a full 24 hours, increased Abilify  from 10 mg to 15 mg. Referral placed for patient to establish [facility] outpatient care [facility] clinic, and 30 days supply of Abilify  sent to pharmacy on file. Patient to  be re-evaluated tomorrow, if no acute safety concerns, patient will likely d/c tomorrow morning. -Type @ Diabetes, continue with AC/HS glucose checks and sliding scale insulin . Pt is scheduled for primary care appointment in 2 weeks, A1C 9.9 indicating severely poor glycemic control.  Suzen Lesches, NP 12/10/2023 1:35 PM

## 2023-12-10 NOTE — ED Notes (Signed)
 Patient resting quietly in bed with eyes closed, Respirations equal and unlabored, skin warm and dry, NAD. Routine safety checks conducted according to facility protocol. Will continue to monitor for safety.

## 2023-12-10 NOTE — ED Notes (Signed)
 CBG=[id number]

## 2023-12-10 NOTE — Discharge Instructions (Addendum)
 You have been referred to [facility].  Call their office on Monday, to schedule next available appointment. You have been given medication refill  for 30 days to last until your next appointment.  Get help right away if: You have thoughts about hurting yourself or others. Get help right away if you feel like you may hurt yourself or others, or have thoughts about taking your own life. Go to your nearest emergency room or: Call 911. Call the National Suicide Prevention Lifeline at 318-541-2332 or 988 in the [place] This is open 24 hours a day. If you're a Veteran: Call 988 and press 1. This is open 24 hours a day. Text the [employer] at 919-656-1049. Summary Mental health is not just the absence of mental illness. It involves understanding your emotions and behaviors, and taking steps to manage them in a healthy way. If you have symptoms of mental or emotional distress, get help from family, friends, a health care provider, or a mental health professional. Practice good mental health behaviors such as stress management skills, self-calming skills, exercise, healthy sleeping and eating, and supportive relationships. This information is not intended to replace advice given to you by your health care provider. Make sure you discuss any questions you have with your health care provider.  Education provided on the fact that if experiencing worsening of psychiatry symptoms including suicidal ideations, homicidal ideations, or having auditory/visual hallucinations, etc, to call 911, 988, come back to this location, or go to the nearest ER. Pt verbalized understanding.

## 2023-12-10 NOTE — ED Notes (Signed)
 Pt presents to [facility] as a voluntary walk-in, unaccompanied with complaint of worsening depression, AH and SI, with a plan to overdose on Fentanyl . Pt reports experiencing homelessness as immediate stressor/trigger. Pt is calm and cooperative. Admission process completed. Pt oriented to the unit and provided with meal. Q15 safety checks in place.

## 2023-12-10 NOTE — ED Notes (Signed)
Patient provided with lunch

## 2023-12-10 NOTE — ED Notes (Signed)
CBG 214 

## 2023-12-10 NOTE — ED Provider Notes (Signed)
 [facility] Continuous Assessment Admission H&P  Date: 12/10/23 Patient Name: [name] MRN: [id number] Chief Complaint: suicidal ideation  Diagnoses:  Final diagnoses:  MDD (major depressive disorder), recurrent episode, moderate (HCC)    HPI:  [name] is a 63 year old male with a history of depression who presents voluntarily as a walk-in to [facility] with complaints of worsening depression and suicidal ideation with a plan to overdose on fentanyl .   Patient seen face to face and his chart was reviewd on 12/09/23. On assessment, he reports experiencing ongoing depressive symptoms despite a recent inpatient psychiatric hospitalization and compliance with his medication regimen.  He endorses feelings of worthlessness, hopelessness, poor sleep, sadness, fatigue, and anhedonia.  He identifies housing instability as his primary stressor. Patient states that he had been living in a boarding house but was evicted earlier today following an argument with other residents. He says this event triggered suicidal ideation, and he reports feeling unable to keep himself safe. He is currently followed by [facility] for therapy and medication management and is prescribed Abilify  and Zoloft . He reports a past suicide attempt by overdose on pills in 1993 but denies any history of self-harming behaviors.   He ia alert and oriented x4. He is cooperative and engages appropriately during interview. Speech is normal in rate, tone, and volume. Mood is depressed, affect is congruent with mood. Thought processes are linear and goal-directed. Thought content is notable for suicidal ideation. He denies homicidal ideation, hallucinations, or delusions.  Insight is fair and judgment is impaired given his suicidal ideation.   Discussed admission to observation unit for safety monitoring and resuming home medications, patient is agreeable.   Total Time spent with patient: 30 minutes  Musculoskeletal  Strength &  Muscle Tone: within normal limits Gait & Station: normal Patient leans: Right  Psychiatric Specialty Exam  Presentation General Appearance:  Casual  Eye Contact: Good  Speech: Clear and Coherent  Speech Volume: Normal  Handedness: Right   Mood and Affect  Mood: Depressed  Affect: Congruent   Thought Process  Thought Processes: Coherent  Descriptions of Associations:Intact  Orientation:Full (Time, Place and Person)  Thought Content:WDL  Diagnosis of Schizophrenia or Schizoaffective disorder in past: No   Hallucinations:Hallucinations: Auditory Description of Auditory Hallucinations: you are a burden, you're worthless, you got to do it now  Ideas of Reference:None  Suicidal Thoughts:Suicidal Thoughts: No  Homicidal Thoughts:Homicidal Thoughts: No   Sensorium  Memory: Immediate Good; Recent Fair; Remote Fair  Judgment: Poor  Insight: Good   Executive Functions  Concentration: Fair  Attention Span: Fair  Recall: Fair  Fund of Knowledge: Good  Language: Good   Psychomotor Activity  Psychomotor Activity: Psychomotor Activity: Normal   Assets  Assets: Communication Skills; Desire for Improvement; Physical Health   Sleep  Sleep: Sleep: Fair Number of Hours of Sleep: 5   Nutritional Assessment (For OBS and FBC admissions only) Has the patient had a weight loss or gain of 10 pounds or more in the last 3 months?: No Has the patient had a decrease in food intake/or appetite?: No Does the patient have eating habits or behaviors that may be indicators of an eating disorder including binging or inducing vomiting?: No Has the patient recently lost weight without trying?: 0 Has the patient been eating poorly because of a decreased appetite?: 0 Malnutrition Screening Tool Score: 0    Physical Exam Vitals and nursing note reviewed.  Constitutional:      General: He is not in  acute distress.    Appearance: He is  well-developed.  HENT:     Head: Normocephalic and atraumatic.  Eyes:     Conjunctiva/sclera: Conjunctivae normal.  Cardiovascular:     Rate and Rhythm: Normal rate.  Pulmonary:     Effort: Pulmonary effort is normal.  Musculoskeletal:     Cervical back: Normal range of motion.  Neurological:     Mental Status: He is alert and oriented to person, place, and time.  Psychiatric:        Attention and Perception: Attention and perception normal.        Mood and Affect: Mood is anxious and depressed.        Speech: Speech normal.        Behavior: Behavior normal. Behavior is cooperative.    Review of Systems  Constitutional: Negative.   HENT: Negative.    Eyes: Negative.   Respiratory: Negative.    Cardiovascular: Negative.   Gastrointestinal: Negative.   Genitourinary: Negative.   Musculoskeletal: Negative.   Skin: Negative.   Neurological: Negative.   Endo/Heme/Allergies: Negative.   Psychiatric/Behavioral:  Positive for depression and substance abuse. The patient is nervous/anxious.     Blood pressure 137/86, pulse 92, temperature 98.1 F (36.7 C), temperature source Skin, resp. rate 16, SpO2 97%. There is no height or weight on file to calculate BMI.  Past Psychiatric History: depression, opoid abuse, cannabis abuse, suicidal ideation   Is the patient at risk to self? Yes  Has the patient been a risk to self in the past 6 months? Yes .    Has the patient been a risk to self within the distant past? Yes   Is the patient a risk to others? No   Has the patient been a risk to others in the past 6 months? No   Has the patient been a risk to others within the distant past? No   Past Medical History:  Past Medical History:  Diagnosis Date   Cancer (HCC)    melanoma;  right ear   Depression    Diabetes mellitus    GERD (gastroesophageal reflux disease)    Hyperlipidemia    Hypertension    Metabolic syndrome    Obesity    PONV (postoperative nausea and vomiting)     YRS AGO WITHER ETHER     Family History:  Family History  Problem Relation Age of Onset   Arthritis Mother    Stroke Father 10       heavy smoker 4-5 packs a day unfiltered   Schizophrenia Father        homeless and did not get medical care in time   Alcoholism Father    Colon cancer Neg Hx    Esophageal cancer Neg Hx    Rectal cancer Neg Hx    Stomach cancer Neg Hx    Colon polyps Neg Hx      Social History:  Social History   Tobacco Use   Smoking status: Former    Current packs/day: 0.00    Average packs/day: 0.5 packs/day for 5.0 years (2.5 ttl pk-yrs)    Types: Cigarettes    Start date: 05/20/1983    Quit date: 05/19/1988    Years since quitting: 35.5   Smokeless tobacco: Never   Tobacco comments:    quit in 1990  Vaping Use   Vaping status: Never Used  Substance Use Topics   Alcohol use: Yes    Alcohol/week: 5.0 standard drinks of alcohol  Types: 2 Glasses of wine, 3 Cans of beer per week    Comment: SOCIAL 3 TIMES PER WEEK 2 DRINKS AT A TIME   Drug use: Not Currently    Types: Marijuana    Comment: HX OF MONTHS AGO     Last Labs:  Admission on 12/09/2023  Component Date Value Ref Range Status   WBC 12/10/2023 7.8  4.0 - 10.5 K/uL Final   RBC 12/10/2023 5.30  4.22 - 5.81 MIL/uL Final   Hemoglobin 12/10/2023 14.6  13.0 - 17.0 g/dL Final   HCT 90/86/7974 43.8  39.0 - 52.0 % Final   MCV 12/10/2023 82.6  80.0 - 100.0 fL Final   MCH 12/10/2023 27.5  26.0 - 34.0 pg Final   MCHC 12/10/2023 33.3  30.0 - 36.0 g/dL Final   RDW 90/86/7974 13.2  11.5 - 15.5 % Final   Platelets 12/10/2023 264  150 - 400 K/uL Final   nRBC 12/10/2023 0.0  0.0 - 0.2 % Final   Neutrophils Relative % 12/10/2023 57  % Final   Neutro Abs 12/10/2023 4.6  1.7 - 7.7 K/uL Final   Lymphocytes Relative 12/10/2023 30  % Final   Lymphs Abs 12/10/2023 2.3  0.7 - 4.0 K/uL Final   Monocytes Relative 12/10/2023 7  % Final   Monocytes Absolute 12/10/2023 0.5  0.1 - 1.0 K/uL Final   Eosinophils  Relative 12/10/2023 5  % Final   Eosinophils Absolute 12/10/2023 0.4  0.0 - 0.5 K/uL Final   Basophils Relative 12/10/2023 1  % Final   Basophils Absolute 12/10/2023 0.0  0.0 - 0.1 K/uL Final   Immature Granulocytes 12/10/2023 0  % Final   Abs Immature Granulocytes 12/10/2023 0.02  0.00 - 0.07 K/uL Final   Performed at [facility], [address] [place], [place] [address]   Sodium 12/10/2023 137  135 - 145 mmol/L Final   Potassium 12/10/2023 4.0  3.5 - 5.1 mmol/L Final   Chloride 12/10/2023 99  98 - 111 mmol/L Final   CO2 12/10/2023 23  22 - 32 mmol/L Final   Glucose, Bld 12/10/2023 213 (H)  70 - 99 mg/dL Final   Glucose reference range applies only to samples taken after fasting for at least 8 hours.   BUN 12/10/2023 13  8 - 23 mg/dL Final   Creatinine, Ser 12/10/2023 1.03  0.61 - 1.24 mg/dL Final   Calcium  12/10/2023 9.3  8.9 - 10.3 mg/dL Final   Total Protein 90/86/7974 6.9  6.5 - 8.1 g/dL Final   Albumin 90/86/7974 3.9  3.5 - 5.0 g/dL Final   AST 90/86/7974 23  15 - 41 U/L Final   ALT 12/10/2023 27  0 - 44 U/L Final   Alkaline Phosphatase 12/10/2023 53  38 - 126 U/L Final   Total Bilirubin 12/10/2023 0.5  0.0 - 1.2 mg/dL Final   GFR, Estimated 12/10/2023 >60  >60 mL/min Final   Comment: (NOTE) Calculated using the CKD-EPI Creatinine Equation (2021)    Anion gap 12/10/2023 15  5 - 15 Final   Performed at [facility], [address], [place], [place] [address]   Hgb A1c MFr Bld 12/10/2023 9.9 (H)  4.8 - 5.6 % Final   Comment: (NOTE) Diagnosis of Diabetes The following HbA1c ranges recommended by the American Diabetes Association (ADA) may be used as an aid in the diagnosis of diabetes mellitus.  Hemoglobin             Suggested  A1C NGSP%              Diagnosis  <5.7                   Non Diabetic  5.7-6.4                Pre-Diabetic  >6.4                   Diabetic  <7.0                   Glycemic control for                       adults with  diabetes.     Mean Plasma Glucose 12/10/2023 237.43  mg/dL Final   Performed at [facility], [address] [place], [place] [address]   Alcohol, Ethyl (B) 12/10/2023 <15  <15 mg/dL Final   Comment: (NOTE) For medical purposes only. Performed at [facility], [address] [place], [place] [address]    TSH 12/10/2023 2.337  0.350 - 4.500 uIU/mL Final   Comment: Performed by a 3rd Generation assay with a functional sensitivity of <=0.01 uIU/mL. Performed at [facility], [address], [place], [place] [address]    POC Amphetamine UR 12/10/2023 None Detected  NONE DETECTED (Cut Off Level 1000 ng/mL) Final   POC Secobarbital (BAR) 12/10/2023 None Detected  NONE DETECTED (Cut Off Level 300 ng/mL) Final   POC Buprenorphine (BUP) 12/10/2023 None Detected  NONE DETECTED (Cut Off Level 10 ng/mL) Final   POC Oxazepam (BZO) 12/10/2023 None Detected  NONE DETECTED (Cut Off Level 300 ng/mL) Final   POC Cocaine UR 12/10/2023 None Detected  NONE DETECTED (Cut Off Level 300 ng/mL) Final   POC Methamphetamine UR 12/10/2023 None Detected  NONE DETECTED (Cut Off Level 1000 ng/mL) Final   POC Morphine 12/10/2023 None Detected  NONE DETECTED (Cut Off Level 300 ng/mL) Final   POC Methadone UR 12/10/2023 None Detected  NONE DETECTED (Cut Off Level 300 ng/mL) Final   POC Oxycodone  UR 12/10/2023 None Detected  NONE DETECTED (Cut Off Level 100 ng/mL) Final   POC Marijuana UR 12/10/2023 Positive (A)  NONE DETECTED (Cut Off Level 50 ng/mL) Final  Admission on 11/06/2023, Discharged on 11/17/2023  Component Date Value Ref Range Status   Glucose-Capillary 11/07/2023 365 (H)  70 - 99 mg/dL Final   Glucose reference range applies only to samples taken after fasting for at least 8 hours.   Glucose-Capillary 11/07/2023 268 (H)  70 - 99 mg/dL Final   Glucose reference range applies only to samples taken after fasting for at least 8 hours.   Glucose-Capillary 11/07/2023 144 (H)  70 - 99 mg/dL  Final   Glucose reference range applies only to samples taken after fasting for at least 8 hours.   Glucose-Capillary 11/07/2023 149 (H)  70 - 99 mg/dL Final   Glucose reference range applies only to samples taken after fasting for at least 8 hours.   Glucose-Capillary 11/08/2023 227 (H)  70 - 99 mg/dL Final   Glucose reference range applies only to samples taken after fasting for at least 8 hours.   Glucose-Capillary 11/08/2023 228 (H)  70 - 99 mg/dL Final   Glucose reference range applies only to samples taken after fasting for at least 8 hours.   Comment 1 11/08/2023 Notify RN   Final   Glucose-Capillary 11/08/2023 153 (  H)  70 - 99 mg/dL Final   Glucose reference range applies only to samples taken after fasting for at least 8 hours.   Glucose-Capillary 11/08/2023 130 (H)  70 - 99 mg/dL Final   Glucose reference range applies only to samples taken after fasting for at least 8 hours.   Comment 1 11/08/2023 Notify RN   Final   Glucose-Capillary 11/09/2023 297 (H)  70 - 99 mg/dL Final   Glucose reference range applies only to samples taken after fasting for at least 8 hours.   Glucose-Capillary 11/09/2023 286 (H)  70 - 99 mg/dL Final   Glucose reference range applies only to samples taken after fasting for at least 8 hours.   Glucose-Capillary 11/09/2023 196 (H)  70 - 99 mg/dL Final   Glucose reference range applies only to samples taken after fasting for at least 8 hours.   Glucose-Capillary 11/09/2023 309 (H)  70 - 99 mg/dL Final   Glucose reference range applies only to samples taken after fasting for at least 8 hours.   Glucose-Capillary 11/10/2023 157 (H)  70 - 99 mg/dL Final   Glucose reference range applies only to samples taken after fasting for at least 8 hours.   Glucose-Capillary 11/10/2023 270 (H)  70 - 99 mg/dL Final   Glucose reference range applies only to samples taken after fasting for at least 8 hours.   Comment 1 11/10/2023 Notify RN   Final   Glucose-Capillary  11/10/2023 138 (H)  70 - 99 mg/dL Final   Glucose reference range applies only to samples taken after fasting for at least 8 hours.   Glucose-Capillary 11/10/2023 208 (H)  70 - 99 mg/dL Final   Glucose reference range applies only to samples taken after fasting for at least 8 hours.   Glucose-Capillary 11/11/2023 195 (H)  70 - 99 mg/dL Final   Glucose reference range applies only to samples taken after fasting for at least 8 hours.   Comment 1 11/11/2023 Notify RN   Final   Glucose-Capillary 11/11/2023 243 (H)  70 - 99 mg/dL Final   Glucose reference range applies only to samples taken after fasting for at least 8 hours.   Glucose-Capillary 11/11/2023 136 (H)  70 - 99 mg/dL Final   Glucose reference range applies only to samples taken after fasting for at least 8 hours.   Glucose-Capillary 11/11/2023 187 (H)  70 - 99 mg/dL Final   Glucose reference range applies only to samples taken after fasting for at least 8 hours.   Glucose-Capillary 11/12/2023 194 (H)  70 - 99 mg/dL Final   Glucose reference range applies only to samples taken after fasting for at least 8 hours.   Comment 1 11/12/2023 Notify RN   Final   Glucose-Capillary 11/12/2023 207 (H)  70 - 99 mg/dL Final   Glucose reference range applies only to samples taken after fasting for at least 8 hours.   Glucose-Capillary 11/12/2023 192 (H)  70 - 99 mg/dL Final   Glucose reference range applies only to samples taken after fasting for at least 8 hours.   Glucose-Capillary 11/12/2023 170 (H)  70 - 99 mg/dL Final   Glucose reference range applies only to samples taken after fasting for at least 8 hours.   Glucose-Capillary 11/13/2023 188 (H)  70 - 99 mg/dL Final   Glucose reference range applies only to samples taken after fasting for at least 8 hours.   Glucose-Capillary 11/13/2023 256 (H)  70 - 99 mg/dL Final  Glucose reference range applies only to samples taken after fasting for at least 8 hours.   Glucose-Capillary 11/13/2023 171  (H)  70 - 99 mg/dL Final   Glucose reference range applies only to samples taken after fasting for at least 8 hours.   Glucose-Capillary 11/13/2023 234 (H)  70 - 99 mg/dL Final   Glucose reference range applies only to samples taken after fasting for at least 8 hours.   Glucose-Capillary 11/14/2023 170 (H)  70 - 99 mg/dL Final   Glucose reference range applies only to samples taken after fasting for at least 8 hours.   Glucose-Capillary 11/14/2023 224 (H)  70 - 99 mg/dL Final   Glucose reference range applies only to samples taken after fasting for at least 8 hours.   Glucose-Capillary 11/14/2023 242 (H)  70 - 99 mg/dL Final   Glucose reference range applies only to samples taken after fasting for at least 8 hours.   Glucose-Capillary 11/14/2023 209 (H)  70 - 99 mg/dL Final   Glucose reference range applies only to samples taken after fasting for at least 8 hours.   Glucose-Capillary 11/15/2023 222 (H)  70 - 99 mg/dL Final   Glucose reference range applies only to samples taken after fasting for at least 8 hours.   Glucose-Capillary 11/15/2023 177 (H)  70 - 99 mg/dL Final   Glucose reference range applies only to samples taken after fasting for at least 8 hours.   Glucose-Capillary 11/15/2023 122 (H)  70 - 99 mg/dL Final   Glucose reference range applies only to samples taken after fasting for at least 8 hours.   Glucose-Capillary 11/15/2023 217 (H)  70 - 99 mg/dL Final   Glucose reference range applies only to samples taken after fasting for at least 8 hours.   Comment 1 11/15/2023 Notify RN   Final   Glucose-Capillary 11/16/2023 187 (H)  70 - 99 mg/dL Final   Glucose reference range applies only to samples taken after fasting for at least 8 hours.   Comment 1 11/16/2023 Notify RN   Final   Glucose-Capillary 11/16/2023 242 (H)  70 - 99 mg/dL Final   Glucose reference range applies only to samples taken after fasting for at least 8 hours.   Glucose-Capillary 11/16/2023 215 (H)  70 - 99  mg/dL Final   Glucose reference range applies only to samples taken after fasting for at least 8 hours.   Comment 1 11/16/2023 Notify RN   Final   Glucose-Capillary 11/16/2023 157 (H)  70 - 99 mg/dL Final   Glucose reference range applies only to samples taken after fasting for at least 8 hours.   Glucose-Capillary 11/17/2023 195 (H)  70 - 99 mg/dL Final   Glucose reference range applies only to samples taken after fasting for at least 8 hours.   Comment 1 11/17/2023 Notify RN   Final  Admission on 11/06/2023, Discharged on 11/06/2023  Component Date Value Ref Range Status   SARS Coronavirus 2 by RT PCR 11/06/2023 NEGATIVE  NEGATIVE Final   Performed at [facility], [address] [place], [place] [address]   WBC 11/06/2023 6.6  4.0 - 10.5 K/uL Final   RBC 11/06/2023 5.32  4.22 - 5.81 MIL/uL Final   Hemoglobin 11/06/2023 14.6  13.0 - 17.0 g/dL Final   HCT 91/89/7974 42.9  39.0 - 52.0 % Final   MCV 11/06/2023 80.6  80.0 - 100.0 fL Final   MCH 11/06/2023 27.4  26.0 - 34.0 pg Final  MCHC 11/06/2023 34.0  30.0 - 36.0 g/dL Final   RDW 91/89/7974 12.9  11.5 - 15.5 % Final   Platelets 11/06/2023 289  150 - 400 K/uL Final   nRBC 11/06/2023 0.0  0.0 - 0.2 % Final   Neutrophils Relative % 11/06/2023 66  % Final   Neutro Abs 11/06/2023 4.4  1.7 - 7.7 K/uL Final   Lymphocytes Relative 11/06/2023 24  % Final   Lymphs Abs 11/06/2023 1.6  0.7 - 4.0 K/uL Final   Monocytes Relative 11/06/2023 5  % Final   Monocytes Absolute 11/06/2023 0.4  0.1 - 1.0 K/uL Final   Eosinophils Relative 11/06/2023 3  % Final   Eosinophils Absolute 11/06/2023 0.2  0.0 - 0.5 K/uL Final   Basophils Relative 11/06/2023 1  % Final   Basophils Absolute 11/06/2023 0.0  0.0 - 0.1 K/uL Final   Immature Granulocytes 11/06/2023 1  % Final   Abs Immature Granulocytes 11/06/2023 0.03  0.00 - 0.07 K/uL Final   Performed at [facility], [address], [place], [place] [address]   Sodium 11/06/2023 129 (L)  135 - 145  mmol/L Final   Potassium 11/06/2023 4.1  3.5 - 5.1 mmol/L Final   Chloride 11/06/2023 93 (L)  98 - 111 mmol/L Final   CO2 11/06/2023 21 (L)  22 - 32 mmol/L Final   Glucose, Bld 11/06/2023 399 (H)  70 - 99 mg/dL Final   Glucose reference range applies only to samples taken after fasting for at least 8 hours.   BUN 11/06/2023 12  8 - 23 mg/dL Final   Creatinine, Ser 11/06/2023 0.92  0.61 - 1.24 mg/dL Final   Calcium  11/06/2023 9.7  8.9 - 10.3 mg/dL Final   Total Protein 91/89/7974 7.2  6.5 - 8.1 g/dL Final   Albumin 91/89/7974 4.0  3.5 - 5.0 g/dL Final   AST 91/89/7974 16  15 - 41 U/L Final   ALT 11/06/2023 20  0 - 44 U/L Final   Alkaline Phosphatase 11/06/2023 54  38 - 126 U/L Final   Total Bilirubin 11/06/2023 0.8  0.0 - 1.2 mg/dL Final   GFR, Estimated 11/06/2023 >60  >60 mL/min Final   Comment: (NOTE) Calculated using the CKD-EPI Creatinine Equation (2021)    Anion gap 11/06/2023 15  5 - 15 Final   Performed at [facility], [address] [place], [place] [address]   Hgb A1c MFr Bld 11/06/2023 10.6 (H)  4.8 - 5.6 % Final   Comment: (NOTE)         Prediabetes: 5.7 - 6.4         Diabetes: >6.4         Glycemic control for adults with diabetes: <7.0    Mean Plasma Glucose 11/06/2023 258  mg/dL Final   Comment: (NOTE) Performed At: [facility] [address] [place], [place] [id number] Jennette Shorter MD Ey:[id number]    Magnesium  11/06/2023 2.0  1.7 - 2.4 mg/dL Final   Performed at [facility], [address], [place], [place] [address]   Alcohol, Ethyl (B) 11/06/2023 <15  <15 mg/dL Final   Comment: (NOTE) For medical purposes only. Performed at [facility], [address] [place], [place] [address]    Cholesterol 11/06/2023 279 (H)  0 - 200 mg/dL Final   Triglycerides 91/89/7974 282 (H)  <150 mg/dL Final   HDL 91/89/7974 60  >40 mg/dL Final   Total CHOL/HDL Ratio 11/06/2023 4.7  RATIO  Final   VLDL 11/06/2023 56 (H)  0 - 40 mg/dL Final   LDL  Cholesterol 11/06/2023 163 (H)  0 - 99 mg/dL Final   Comment:        Total Cholesterol/HDL:CHD Risk Coronary Heart Disease Risk Table                     Men   Women  1/2 Average Risk   3.4   3.3  Average Risk       5.0   4.4  2 X Average Risk   9.6   7.1  3 X Average Risk  23.4   11.0        Use the calculated Patient Ratio above and the CHD Risk Table to determine the patient's CHD Risk.        ATP III CLASSIFICATION (LDL):  <100     mg/dL   Optimal  899-870  mg/dL   Near or Above                    Optimal  130-159  mg/dL   Borderline  839-810  mg/dL   High  >809     mg/dL   Very High Performed at [facility], [address] [place], [place] [address]    TSH 11/06/2023 1.028  0.350 - 4.500 uIU/mL Final   Comment: Performed by a 3rd Generation assay with a functional sensitivity of <=0.01 uIU/mL. Performed at [facility], [address], [place], [place] [address]    RPR Ser Ql 11/06/2023 NON REACTIVE  NON REACTIVE Final   Performed at [facility], [address], [place], [place] [address]   Color, Urine 11/06/2023 YELLOW  YELLOW Final   APPearance 11/06/2023 CLEAR  CLEAR Final   Specific Gravity, Urine 11/06/2023 1.017  1.005 - 1.030 Final   pH 11/06/2023 6.0  5.0 - 8.0 Final   Glucose, UA 11/06/2023 >=500 (A)  NEGATIVE mg/dL Final   Hgb urine dipstick 11/06/2023 NEGATIVE  NEGATIVE Final   Bilirubin Urine 11/06/2023 NEGATIVE  NEGATIVE Final   Ketones, ur 11/06/2023 NEGATIVE  NEGATIVE mg/dL Final   Protein, ur 91/89/7974 NEGATIVE  NEGATIVE mg/dL Final   Nitrite 91/89/7974 NEGATIVE  NEGATIVE Final   Leukocytes,Ua 11/06/2023 NEGATIVE  NEGATIVE Final   RBC / HPF 11/06/2023 0-5  0 - 5 RBC/hpf Final   WBC, UA 11/06/2023 0-5  0 - 5 WBC/hpf Final   Bacteria, UA 11/06/2023 NONE SEEN  NONE SEEN Final   Squamous Epithelial / HPF 11/06/2023 0-5  0 - 5 /HPF Final   Performed at [facility], [address], [place], [place] [address]   POC Amphetamine  UR 11/06/2023 None Detected  NONE DETECTED (Cut Off Level 1000 ng/mL) Final   POC Secobarbital (BAR) 11/06/2023 None Detected  NONE DETECTED (Cut Off Level 300 ng/mL) Final   POC Buprenorphine (BUP) 11/06/2023 None Detected  NONE DETECTED (Cut Off Level 10 ng/mL) Final   POC Oxazepam (BZO) 11/06/2023 None Detected  NONE DETECTED (Cut Off Level 300 ng/mL) Final   POC Cocaine UR 11/06/2023 None Detected  NONE DETECTED (Cut Off Level 300 ng/mL) Final   POC Methamphetamine UR 11/06/2023 None Detected  NONE DETECTED (Cut Off Level 1000 ng/mL) Final   POC Morphine 11/06/2023 None Detected  NONE DETECTED (Cut Off Level 300 ng/mL) Final   POC Methadone UR 11/06/2023 None Detected  NONE DETECTED (Cut Off Level 300 ng/mL) Final  POC Oxycodone  UR 11/06/2023 None Detected  NONE DETECTED (Cut Off Level 100 ng/mL) Final   POC Marijuana UR 11/06/2023 None Detected  NONE DETECTED (Cut Off Level 50 ng/mL) Final   HIV Screen 4th Generation wRfx 11/06/2023 Non Reactive  Non Reactive Final   Performed at [facility], [address] [place], [place] [address]   Glucose-Capillary 11/06/2023 351 (H)  70 - 99 mg/dL Final   Glucose reference range applies only to samples taken after fasting for at least 8 hours.   Glucose-Capillary 11/06/2023 373 (H)  70 - 99 mg/dL Final   Glucose reference range applies only to samples taken after fasting for at least 8 hours.   Glucose-Capillary 11/06/2023 269 (H)  70 - 99 mg/dL Final   Glucose reference range applies only to samples taken after fasting for at least 8 hours.   Glucose-Capillary 11/06/2023 160 (H)  70 - 99 mg/dL Final   Glucose reference range applies only to samples taken after fasting for at least 8 hours.  Admission on 08/26/2023, Discharged on 08/31/2023  Component Date Value Ref Range Status   Glucose-Capillary 08/28/2023 163 (H)  70 - 99 mg/dL Final   Glucose reference range applies only to samples taken after fasting for at least 8 hours.    Glucose-Capillary 08/28/2023 151 (H)  70 - 99 mg/dL Final   Glucose reference range applies only to samples taken after fasting for at least 8 hours.   Glucose-Capillary 08/28/2023 126 (H)  70 - 99 mg/dL Final   Glucose reference range applies only to samples taken after fasting for at least 8 hours.   Comment 1 08/28/2023 Notify RN   Final   Glucose-Capillary 08/29/2023 191 (H)  70 - 99 mg/dL Final   Glucose reference range applies only to samples taken after fasting for at least 8 hours.   Comment 1 08/29/2023 Notify RN   Final   Glucose-Capillary 08/29/2023 177 (H)  70 - 99 mg/dL Final   Glucose reference range applies only to samples taken after fasting for at least 8 hours.   Glucose-Capillary 08/29/2023 157 (H)  70 - 99 mg/dL Final   Glucose reference range applies only to samples taken after fasting for at least 8 hours.   Glucose-Capillary 08/29/2023 168 (H)  70 - 99 mg/dL Final   Glucose reference range applies only to samples taken after fasting for at least 8 hours.   Comment 1 08/29/2023 Notify RN   Final   Glucose-Capillary 08/30/2023 215 (H)  70 - 99 mg/dL Final   Glucose reference range applies only to samples taken after fasting for at least 8 hours.   Glucose-Capillary 08/30/2023 220 (H)  70 - 99 mg/dL Final   Glucose reference range applies only to samples taken after fasting for at least 8 hours.   Glucose-Capillary 08/30/2023 161 (H)  70 - 99 mg/dL Final   Glucose reference range applies only to samples taken after fasting for at least 8 hours.   Glucose-Capillary 08/31/2023 155 (H)  70 - 99 mg/dL Final   Glucose reference range applies only to samples taken after fasting for at least 8 hours.   Comment 1 08/31/2023 Notify RN   Final   Comment 2 08/31/2023 Document in Chart   Final  Admission on 08/25/2023, Discharged on 08/26/2023  Component Date Value Ref Range Status   WBC 08/25/2023 9.0  4.0 - 10.5 K/uL Final   RBC 08/25/2023 5.38  4.22 - 5.81 MIL/uL Final  Hemoglobin 08/25/2023 14.8  13.0 - 17.0 g/dL Final   HCT 94/70/7974 43.9  39.0 - 52.0 % Final   MCV 08/25/2023 81.6  80.0 - 100.0 fL Final   MCH 08/25/2023 27.5  26.0 - 34.0 pg Final   MCHC 08/25/2023 33.7  30.0 - 36.0 g/dL Final   RDW 94/70/7974 13.1  11.5 - 15.5 % Final   Platelets 08/25/2023 282  150 - 400 K/uL Final   nRBC 08/25/2023 0.0  0.0 - 0.2 % Final   Neutrophils Relative % 08/25/2023 68  % Final   Neutro Abs 08/25/2023 6.1  1.7 - 7.7 K/uL Final   Lymphocytes Relative 08/25/2023 23  % Final   Lymphs Abs 08/25/2023 2.1  0.7 - 4.0 K/uL Final   Monocytes Relative 08/25/2023 5  % Final   Monocytes Absolute 08/25/2023 0.5  0.1 - 1.0 K/uL Final   Eosinophils Relative 08/25/2023 3  % Final   Eosinophils Absolute 08/25/2023 0.3  0.0 - 0.5 K/uL Final   Basophils Relative 08/25/2023 1  % Final   Basophils Absolute 08/25/2023 0.1  0.0 - 0.1 K/uL Final   Immature Granulocytes 08/25/2023 0  % Final   Abs Immature Granulocytes 08/25/2023 0.02  0.00 - 0.07 K/uL Final   Performed at [facility], [address] [place], [place] [address]   Sodium 08/25/2023 136  135 - 145 mmol/L Final   Potassium 08/25/2023 3.9  3.5 - 5.1 mmol/L Final   Chloride 08/25/2023 102  98 - 111 mmol/L Final   CO2 08/25/2023 21 (L)  22 - 32 mmol/L Final   Glucose, Bld 08/25/2023 178 (H)  70 - 99 mg/dL Final   Glucose reference range applies only to samples taken after fasting for at least 8 hours.   BUN 08/25/2023 13  8 - 23 mg/dL Final   Creatinine, Ser 08/25/2023 1.14  0.61 - 1.24 mg/dL Final   Calcium  08/25/2023 9.7  8.9 - 10.3 mg/dL Final   Total Protein 94/70/7974 7.4  6.5 - 8.1 g/dL Final   Albumin 94/70/7974 4.2  3.5 - 5.0 g/dL Final   AST 94/70/7974 29  15 - 41 U/L Final   ALT 08/25/2023 30  0 - 44 U/L Final   Alkaline Phosphatase 08/25/2023 53  38 - 126 U/L Final   Total Bilirubin 08/25/2023 0.6  0.0 - 1.2 mg/dL Final   GFR, Estimated 08/25/2023 >60  >60 mL/min Final   Comment:  (NOTE) Calculated using the CKD-EPI Creatinine Equation (2021)    Anion gap 08/25/2023 13  5 - 15 Final   Performed at [facility], [address] [place], [place] [address]   Hgb A1c MFr Bld 08/25/2023 9.4 (H)  4.8 - 5.6 % Final   Comment: (NOTE) Diagnosis of Diabetes The following HbA1c ranges recommended by the American Diabetes Association (ADA) may be used as an aid in the diagnosis of diabetes mellitus.  Hemoglobin             Suggested A1C NGSP%              Diagnosis  <5.7                   Non Diabetic  5.7-6.4                Pre-Diabetic  >6.4                   Diabetic  <7.0  Glycemic control for                       adults with diabetes.     Mean Plasma Glucose 08/25/2023 223.08  mg/dL Final   Performed at [facility], [address], [place], [place] [address]   Magnesium  08/25/2023 1.9  1.7 - 2.4 mg/dL Final   Performed at [facility], [address] [place], [place] [address]   Alcohol, Ethyl (B) 08/25/2023 <15  <15 mg/dL Final   Comment: (NOTE) For medical purposes only. Performed at [facility], [address] [place], [place] [address]    Cholesterol 08/25/2023 138  0 - 200 mg/dL Final   Triglycerides 94/70/7974 201 (H)  <150 mg/dL Final   HDL 94/70/7974 41  >40 mg/dL Final   Total CHOL/HDL Ratio 08/25/2023 3.4  RATIO Final   VLDL 08/25/2023 40  0 - 40 mg/dL Final   LDL Cholesterol 08/25/2023 57  0 - 99 mg/dL Final   Comment:        Total Cholesterol/HDL:CHD Risk Coronary Heart Disease Risk Table                     Men   Women  1/2 Average Risk   3.4   3.3  Average Risk       5.0   4.4  2 X Average Risk   9.6   7.1  3 X Average Risk  23.4   11.0        Use the calculated Patient Ratio above and the CHD Risk Table to determine the patient's CHD Risk.        ATP III CLASSIFICATION (LDL):  <100     mg/dL   Optimal  899-870  mg/dL   Near or Above                    Optimal  130-159  mg/dL    Borderline  839-810  mg/dL   High  >809     mg/dL   Very High Performed at [facility], [address], [place], [place] [address]    TSH 08/25/2023 1.049  0.350 - 4.500 uIU/mL Final   Comment: Performed by a 3rd Generation assay with a functional sensitivity of <=0.01 uIU/mL. Performed at [facility], [address] [place], [place] [address]    Color, Urine 08/25/2023 YELLOW  YELLOW Final   APPearance 08/25/2023 TURBID (A)  CLEAR Final   Specific Gravity, Urine 08/25/2023 1.028  1.005 - 1.030 Final   pH 08/25/2023 5.0  5.0 - 8.0 Final   Glucose, UA 08/25/2023 50 (A)  NEGATIVE mg/dL Final   Hgb urine dipstick 08/25/2023 NEGATIVE  NEGATIVE Final   Bilirubin Urine 08/25/2023 NEGATIVE  NEGATIVE Final   Ketones, ur 08/25/2023 5 (A)  NEGATIVE mg/dL Final   Protein, ur 94/70/7974 30 (A)  NEGATIVE mg/dL Final   Nitrite 94/70/7974 NEGATIVE  NEGATIVE Final   Leukocytes,Ua 08/25/2023 NEGATIVE  NEGATIVE Final   RBC / HPF 08/25/2023 6-10  0 - 5 RBC/hpf Final   WBC, UA 08/25/2023 0-5  0 - 5 WBC/hpf Final   Bacteria, UA 08/25/2023 FEW (A)  NONE SEEN Final   Squamous Epithelial / HPF 08/25/2023 0-5  0 - 5 /HPF Final   Mucus 08/25/2023 PRESENT   Final   Performed at [facility], [address], [place], [place] [address]  POC Amphetamine UR 08/25/2023 None Detected  NONE DETECTED (Cut Off Level 1000 ng/mL) Final   POC Secobarbital (BAR) 08/25/2023 None Detected  NONE DETECTED (Cut Off Level 300 ng/mL) Final   POC Buprenorphine (BUP) 08/25/2023 None Detected  NONE DETECTED (Cut Off Level 10 ng/mL) Final   POC Oxazepam (BZO) 08/25/2023 None Detected  NONE DETECTED (Cut Off Level 300 ng/mL) Final   POC Cocaine UR 08/25/2023 None Detected  NONE DETECTED (Cut Off Level 300 ng/mL) Final   POC Methamphetamine UR 08/25/2023 None Detected  NONE DETECTED (Cut Off Level 1000 ng/mL) Final   POC Morphine 08/25/2023 None Detected  NONE DETECTED (Cut Off Level 300 ng/mL) Final   POC  Methadone UR 08/25/2023 None Detected  NONE DETECTED (Cut Off Level 300 ng/mL) Final   POC Oxycodone  UR 08/25/2023 None Detected  NONE DETECTED (Cut Off Level 100 ng/mL) Final   POC Marijuana UR 08/25/2023 None Detected  NONE DETECTED (Cut Off Level 50 ng/mL) Final    Allergies: Other  Medications:  Facility Ordered Medications  Medication   acetaminophen  (TYLENOL ) tablet 650 mg   alum & mag hydroxide-simeth (MAALOX/MYLANTA) 200-200-20 MG/5ML suspension 30 mL   magnesium  hydroxide (MILK OF MAGNESIA) suspension 30 mL   haloperidol  (HALDOL ) tablet 5 mg   And   diphenhydrAMINE  (BENADRYL ) capsule 50 mg   haloperidol  lactate (HALDOL ) injection 5 mg   And   diphenhydrAMINE  (BENADRYL ) injection 50 mg   And   LORazepam  (ATIVAN ) injection 2 mg   haloperidol  lactate (HALDOL ) injection 10 mg   And   diphenhydrAMINE  (BENADRYL ) injection 50 mg   And   LORazepam  (ATIVAN ) injection 2 mg   hydrOXYzine  (ATARAX ) tablet 25 mg   traZODone  (DESYREL ) tablet 50 mg   PTA Medications  Medication Sig   atorvastatin  (LIPITOR) 40 MG tablet Take 1 tablet (40 mg total) by mouth daily.   lisinopril  (ZESTRIL ) 40 MG tablet Take 1 tablet (40 mg total) by mouth daily.   metFORMIN  (GLUCOPHAGE ) 1000 MG tablet Take 1 tablet (1,000 mg total) by mouth 2 (two) times daily with a meal.   pantoprazole  (PROTONIX ) 40 MG tablet Take 1 tablet (40 mg total) by mouth daily.   ARIPiprazole  (ABILIFY ) 5 MG tablet Take 1 tablet (5 mg total) by mouth daily.   glipiZIDE  (GLUCOTROL  XL) 5 MG 24 hr tablet Take 1 tablet (5 mg total) by mouth 2 (two) times daily before a meal.   insulin  lispro (HUMALOG  KWIKPEN) 100 UNIT/ML KwikPen Inject 0-15 Units into the skin 3 (three) times daily with meals AND 0-5 Units at bedtime.   insulin  aspart (NOVOLOG ) 100 UNIT/ML injection Inject 0-5 Units into the skin at bedtime.   prazosin  (MINIPRESS ) 1 MG capsule Take 1 capsule (1 mg total) by mouth at bedtime.   amLODipine  (NORVASC ) 10 MG tablet Take 1  tablet (10 mg total) by mouth daily.   ARIPiprazole  (ABILIFY ) 10 MG tablet Take 1 tablet by mouth daily   prazosin  (MINIPRESS ) 1 MG capsule Take 1 capsule by mouth at bedtime   sertraline  (ZOLOFT ) 50 MG tablet Take 3 tablets by mouth daily   traZODone  (DESYREL ) 50 MG tablet Take 1 tablet (50 mg total) by mouth at bedtime as needed for sleep.      Medical Decision Making  Admit patient for observation due to acute suicidal ideation.   Recommendations  Based on my evaluation the patient does not appear to have an emergency medical condition.  Kathryne DELENA Show, NP 12/10/23  7:02 AM

## 2023-12-10 NOTE — ED Notes (Signed)
 Pt sleeping at this time. Rise and fall of chest noted. Pt in NAD at this time. Will continue to monitor.

## 2023-12-10 NOTE — ED Notes (Signed)
 Pt A&Ox4, calm & cooperative and in NAD at this time. Endorses SI w/ plan to OD on fentanyl , denies HI, endorses AH of voices, denies VH. Contracts for safety on unit. Encouragement and support given. Will continue to monitor.

## 2023-12-10 NOTE — ED Notes (Signed)
 Provided pt w/ shower supplies

## 2023-12-10 NOTE — ED Notes (Signed)
 Pt observed/assessed in recliner sleeping. RR even and unlabored, appearing in no noted distress. Environmental check complete, will continue to monitor for safety

## 2023-12-11 DIAGNOSIS — F331 Major depressive disorder, recurrent, moderate: Secondary | ICD-10-CM | POA: Diagnosis not present

## 2023-12-11 LAB — GLUCOSE, CAPILLARY: Glucose-Capillary: 211 mg/dL — ABNORMAL HIGH (ref 70–99)

## 2023-12-11 MED ORDER — SERTRALINE HCL 50 MG PO TABS: 150.0000 mg | ORAL_TABLET | Freq: Every day | ORAL | 0 refills | Status: DC

## 2023-12-11 MED ORDER — TRAZODONE HCL 50 MG PO TABS: 50.0000 mg | ORAL_TABLET | Freq: Every evening | ORAL | 0 refills | Status: DC | PRN

## 2023-12-11 MED ORDER — ARIPIPRAZOLE 15 MG PO TABS: 15.0000 mg | ORAL_TABLET | Freq: Every day | ORAL | 0 refills | Status: DC

## 2023-12-11 MED ORDER — HYDROXYZINE HCL 25 MG PO TABS: 25.0000 mg | ORAL_TABLET | Freq: Three times a day (TID) | ORAL | 0 refills | Status: DC | PRN

## 2023-12-11 NOTE — ED Notes (Signed)
 Pt A&Ox4, calm & cooperative and in NAD at this time. Denies SI/HI/AVH. Contracts for safety. Encouragement and support given. Will continue to monitor.

## 2023-12-11 NOTE — ED Notes (Signed)
 Discharge instructions reviewed w/ pt. Medications, follow-up care and resources reviewed. Pt verbalized understanding. All belongings returned to pt. Pt A&Ox4, ambulatory w/ steady gait and VSS upon departure.

## 2023-12-11 NOTE — ED Notes (Signed)
 Snack was given at bedside.

## 2023-12-11 NOTE — ED Notes (Signed)
CBG 211 

## 2023-12-11 NOTE — ED Notes (Signed)
 Pt sleeping at this time. Rise and fall of chest noted. Pt in NAD at this time. Will continue to monitor.

## 2023-12-11 NOTE — ED Notes (Signed)
 Breakfast was provided at bedside

## 2023-12-12 ENCOUNTER — Ambulatory Visit (HOSPITAL_BASED_OUTPATIENT_CLINIC_OR_DEPARTMENT_OTHER)

## 2023-12-12 ENCOUNTER — Other Ambulatory Visit (HOSPITAL_COMMUNITY): Payer: Self-pay

## 2023-12-12 VITALS — BP 136/84 | HR 84 | Ht 68.0 in | Wt 239.0 lb

## 2023-12-12 DIAGNOSIS — F332 Major depressive disorder, recurrent severe without psychotic features: Secondary | ICD-10-CM

## 2023-12-12 DIAGNOSIS — F411 Generalized anxiety disorder: Secondary | ICD-10-CM | POA: Diagnosis not present

## 2023-12-12 DIAGNOSIS — F4310 Post-traumatic stress disorder, unspecified: Secondary | ICD-10-CM

## 2023-12-12 LAB — GLUCOSE, CAPILLARY: Glucose-Capillary: 278 mg/dL — ABNORMAL HIGH (ref 70–99)

## 2023-12-12 MED ORDER — PRAZOSIN HCL 1 MG PO CAPS
1.0000 mg | ORAL_CAPSULE | Freq: Every day | ORAL | 1 refills | Status: DC
  Filled 2023-12-12: qty 30, 30d supply, fill #0

## 2023-12-12 MED ORDER — ARIPIPRAZOLE 15 MG PO TABS
15.0000 mg | ORAL_TABLET | Freq: Every day | ORAL | 1 refills | Status: DC
  Filled 2023-12-12: qty 30, 30d supply, fill #0

## 2023-12-12 MED ORDER — TRAZODONE HCL 50 MG PO TABS
50.0000 mg | ORAL_TABLET | Freq: Every evening | ORAL | 1 refills | Status: DC | PRN
  Filled 2023-12-12: qty 30, 30d supply, fill #0

## 2023-12-12 MED ORDER — SERTRALINE HCL 50 MG PO TABS
150.0000 mg | ORAL_TABLET | Freq: Every day | ORAL | 1 refills | Status: DC
  Filled 2023-12-12: qty 90, 30d supply, fill #0
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
## 2023-12-13 ENCOUNTER — Other Ambulatory Visit: Payer: Self-pay

## 2023-12-14 NOTE — Addendum Note (Signed)
 Addended by: CARVIN CROCK on: 12/14/2023 03:09 PM   Modules accepted: Level of Service

## 2023-12-17 ENCOUNTER — Other Ambulatory Visit: Payer: Self-pay

## 2023-12-17 ENCOUNTER — Emergency Department (EMERGENCY_DEPARTMENT_HOSPITAL)
Admission: EM | Admit: 2023-12-17 | Discharge: 2023-12-18 | Disposition: A | Discharge status: Home / Self Care | Source: Home / Self Care | Attending: Emergency Medicine | Admitting: Emergency Medicine

## 2023-12-17 DIAGNOSIS — F329 Major depressive disorder, single episode, unspecified: Secondary | ICD-10-CM | POA: Diagnosis not present

## 2023-12-17 DIAGNOSIS — E1165 Type 2 diabetes mellitus with hyperglycemia: Secondary | ICD-10-CM | POA: Insufficient documentation

## 2023-12-17 DIAGNOSIS — F341 Dysthymic disorder: Secondary | ICD-10-CM

## 2023-12-17 DIAGNOSIS — Z85820 Personal history of malignant melanoma of skin: Secondary | ICD-10-CM | POA: Insufficient documentation

## 2023-12-17 DIAGNOSIS — Z79899 Other long term (current) drug therapy: Secondary | ICD-10-CM | POA: Insufficient documentation

## 2023-12-17 DIAGNOSIS — R45851 Suicidal ideations: Secondary | ICD-10-CM | POA: Insufficient documentation

## 2023-12-17 DIAGNOSIS — F1010 Alcohol abuse, uncomplicated: Secondary | ICD-10-CM

## 2023-12-17 DIAGNOSIS — Z87891 Personal history of nicotine dependence: Secondary | ICD-10-CM | POA: Insufficient documentation

## 2023-12-17 DIAGNOSIS — Z7984 Long term (current) use of oral hypoglycemic drugs: Secondary | ICD-10-CM | POA: Insufficient documentation

## 2023-12-17 DIAGNOSIS — I10 Essential (primary) hypertension: Secondary | ICD-10-CM | POA: Insufficient documentation

## 2023-12-17 DIAGNOSIS — F1210 Cannabis abuse, uncomplicated: Secondary | ICD-10-CM

## 2023-12-17 DIAGNOSIS — Z5900 Homelessness unspecified: Secondary | ICD-10-CM | POA: Insufficient documentation

## 2023-12-17 DIAGNOSIS — F333 Major depressive disorder, recurrent, severe with psychotic symptoms: Secondary | ICD-10-CM | POA: Diagnosis not present

## 2023-12-17 LAB — URINE DRUG SCREEN
Amphetamines: NEGATIVE
Barbiturates: NEGATIVE
Benzodiazepines: NEGATIVE
Cocaine: NEGATIVE
Fentanyl: NEGATIVE
Methadone Scn, Ur: NEGATIVE
Opiates: NEGATIVE
Tetrahydrocannabinol: POSITIVE — AB

## 2023-12-17 LAB — CBC
HCT: 39.6 % (ref 39.0–52.0)
Hemoglobin: 12.6 g/dL — ABNORMAL LOW (ref 13.0–17.0)
MCH: 27 pg (ref 26.0–34.0)
MCHC: 31.8 g/dL (ref 30.0–36.0)
MCV: 84.8 fL (ref 80.0–100.0)
Platelets: 218 K/uL (ref 150–400)
RBC: 4.67 MIL/uL (ref 4.22–5.81)
RDW: 13.2 % (ref 11.5–15.5)
WBC: 6.2 K/uL (ref 4.0–10.5)
nRBC: 0 % (ref 0.0–0.2)

## 2023-12-17 LAB — COMPREHENSIVE METABOLIC PANEL WITH GFR
ALT: 20 U/L (ref 0–44)
AST: 19 U/L (ref 15–41)
Albumin: 4 g/dL (ref 3.5–5.0)
Alkaline Phosphatase: 61 U/L (ref 38–126)
Anion gap: 14 (ref 5–15)
BUN: 16 mg/dL (ref 8–23)
CO2: 23 mmol/L (ref 22–32)
Calcium: 9.1 mg/dL (ref 8.9–10.3)
Chloride: 101 mmol/L (ref 98–111)
Creatinine, Ser: 1 mg/dL (ref 0.61–1.24)
GFR, Estimated: >60 mL/min (ref >60)
Glucose, Bld: 302 mg/dL — ABNORMAL HIGH (ref 70–99)
Potassium: 4.2 mmol/L (ref 3.5–5.1)
Sodium: 139 mmol/L (ref 135–145)
Total Bilirubin: 0.4 mg/dL (ref 0.0–1.2)
Total Protein: 6.5 g/dL (ref 6.5–8.1)

## 2023-12-17 LAB — ETHANOL: Alcohol, Ethyl (B): <15 mg/dL (ref <15)

## 2023-12-17 LAB — CBG MONITORING, ED: Glucose-Capillary: 215 mg/dL — ABNORMAL HIGH (ref 70–99)

## 2023-12-17 MED ORDER — GLIPIZIDE ER 5 MG PO TB24
5.0000 mg | ORAL_TABLET | Freq: Two times a day (BID) | ORAL | Status: DC
  Filled 2023-12-17: qty 1

## 2023-12-17 MED ORDER — HYDROXYZINE HCL 25 MG PO TABS: 25.0000 mg | ORAL_TABLET | Freq: Three times a day (TID) | ORAL | Status: DC | PRN

## 2023-12-17 MED ORDER — TRAZODONE HCL 100 MG PO TABS: 50.0000 mg | ORAL_TABLET | Freq: Every evening | ORAL | Status: DC | PRN

## 2023-12-17 MED ORDER — PRAZOSIN HCL 1 MG PO CAPS
1.0000 mg | ORAL_CAPSULE | Freq: Every day | ORAL | Status: DC
  Administered 2023-12-17: 1 mg via ORAL
  Filled 2023-12-17: qty 1

## 2023-12-17 MED ORDER — METFORMIN HCL 500 MG PO TABS: 1000.0000 mg | ORAL_TABLET | Freq: Two times a day (BID) | ORAL | Status: DC

## 2023-12-17 MED ORDER — SERTRALINE HCL 50 MG PO TABS
150.0000 mg | ORAL_TABLET | Freq: Every day | ORAL | Status: DC
  Administered 2023-12-17: 150 mg via ORAL
  Filled 2023-12-17: qty 3

## 2023-12-17 NOTE — ED Provider Notes (Signed)
 [place] [facility] AT [facility] Provider Note   CSN: [id number] Arrival date & time: 12/17/23  1514     Patient presents with: Suicidal   [name] is a 63 y.o. male.   Patient with history of diabetes, depression, hyperlipidemia, GERD, hypertension presents today with complaints of suicidal ideation. Reports he is currently newly homeless which has been a difficult adjustment for him. Reports he has a plan to take all his medications at once. Reports that he has attempted suicide previously. Denies any HI or AVH. Denies access to firearms. Denies any somatic complaints.    The history is provided by the patient. No language interpreter was used.       Prior to Admission medications   Medication Sig Start Date End Date Taking? Authorizing Provider  ARIPiprazole  (ABILIFY ) 15 MG tablet Take 1 tablet (15 mg total) by mouth daily. 12/12/23   Kapoor, Sahil, MD  atorvastatin  (LIPITOR) 40 MG tablet Take 1 tablet (40 mg total) by mouth daily. 03/10/23   Katrinka Garnette KIDD, MD  glipiZIDE  (GLUCOTROL  XL) 5 MG 24 hr tablet Take 1 tablet (5 mg total) by mouth 2 (two) times daily before a meal. 11/17/23   Donnelly Mellow, MD  hydrOXYzine  (ATARAX ) 25 MG tablet Take 1 tablet (25 mg total) by mouth 3 (three) times daily as needed for anxiety. 12/11/23   Tex Drilling, NP  lisinopril  (ZESTRIL ) 40 MG tablet Take 1 tablet (40 mg total) by mouth daily. Patient not taking: Reported on 12/10/2023 08/31/23   Zouev, Dmitri, MD  MELATONIN PO Take by mouth at bedtime as needed (sleep).    [provider]  metFORMIN  (GLUCOPHAGE ) 1000 MG tablet Take 1 tablet (1,000 mg total) by mouth 2 (two) times daily with a meal. 08/31/23   Zouev, Dmitri, MD  pantoprazole  (PROTONIX ) 40 MG tablet Take 1 tablet (40 mg total) by mouth daily. 08/31/23   Zouev, Dmitri, MD  prazosin  (MINIPRESS ) 1 MG capsule Take 1 capsule (1 mg total) by mouth at bedtime. 12/12/23   Kapoor, Sahil, MD   sertraline  (ZOLOFT ) 50 MG tablet Take 3 tablets (150 mg total) by mouth daily. 12/12/23   Kapoor, Sahil, MD  traZODone  (DESYREL ) 50 MG tablet Take 1 tablet (50 mg total) by mouth at bedtime as needed for sleep. 12/12/23   Kapoor, Sahil, MD    Allergies: Other    Review of Systems  Psychiatric/Behavioral:  Positive for suicidal ideas.   All other systems reviewed and are negative.   Updated Vital Signs BP 133/83 (BP Location: Left Arm)   Pulse 89   Temp 97.8 F (36.6 C) (Oral)   Resp 16   SpO2 95%   Physical Exam Vitals and nursing note reviewed.  Constitutional:      General: He is not in acute distress.    Appearance: Normal appearance. He is normal weight. He is not ill-appearing, toxic-appearing or diaphoretic.  HENT:     Head: Normocephalic and atraumatic.  Cardiovascular:     Rate and Rhythm: Normal rate.  Pulmonary:     Effort: Pulmonary effort is normal. No respiratory distress.  Abdominal:     General: Abdomen is flat.     Palpations: Abdomen is soft.     Tenderness: There is no abdominal tenderness.  Musculoskeletal:        General: Normal range of motion.     Cervical back: Normal range of motion.  Skin:    General: Skin is warm and dry.  Neurological:     General: No focal deficit present.     Mental Status: He is alert.  Psychiatric:        Mood and Affect: Mood normal.        Behavior: Behavior normal.     Comments: Does not appear to be responding to internal stimuli     (all labs ordered are listed, but only abnormal results are displayed) Labs Reviewed  COMPREHENSIVE METABOLIC PANEL WITH GFR - Abnormal; Notable for the following components:      Result Value   Glucose, Bld 302 (*)    All other components within normal limits  CBC - Abnormal; Notable for the following components:   Hemoglobin 12.6 (*)    All other components within normal limits  URINE DRUG SCREEN - Abnormal; Notable for the following components:   Tetrahydrocannabinol  POSITIVE (*)    All other components within normal limits  ETHANOL    EKG: None  Radiology: No results found.   Procedures   Medications Ordered in the ED  glipiZIDE  (GLUCOTROL  XL) 24 hr tablet 5 mg (has no administration in time range)  hydrOXYzine  (ATARAX ) tablet 25 mg (has no administration in time range)  metFORMIN  (GLUCOPHAGE ) tablet 1,000 mg (has no administration in time range)  traZODone  (DESYREL ) tablet 50 mg (has no administration in time range)  prazosin  (MINIPRESS ) capsule 1 mg (has no administration in time range)  sertraline  (ZOLOFT ) tablet 150 mg (has no administration in time range)                                    Medical Decision Making Amount and/or Complexity of Data Reviewed Labs: ordered.   Patient is a 63 y.o. male  who presents to the emergency department for psychiatric complaint.  Past Medical History:  has a past medical history of Cancer (HCC), Depression, Diabetes mellitus, GERD (gastroesophageal reflux disease), Hyperlipidemia, Hypertension, Metabolic syndrome, Obesity, and PONV (postoperative nausea and vomiting).  Physical Exam: Abdomen soft and nontender, does not appear to be responding to internal stimuli.  Labs: Medical clearance labs ordered, with following pertinent results: hgb 12.6, glucose 302, appears that this is consistent with his baseline. No signs of DKA/HHS. UDS + THC  Medications: Ordered patients home medications  Disposition:  Patient is medically cleared at this time, does have hyperglycemia, reports difficulty accessing his medications. These have been ordered as well as regular CBG checks and carb modified diet.   Will consult TTS and appreciate their recommendations.  Final diagnoses:  Suicidal ideation  Type 2 diabetes mellitus with hyperglycemia, without long-term current use of insulin  [facility])    ED Discharge Orders     None          Dustin Lauraine DELENA DEVONNA 12/17/23 LEANOR Armenta Canning,  MD 12/17/23 [id number]    Armenta Canning, MD 12/17/23 2157

## 2023-12-17 NOTE — ED Triage Notes (Signed)
 Brought in by police for SI. Pt called hotline stating he was going to take all of his medications.

## 2023-12-17 NOTE — BH Assessment (Signed)
 [facility] contacted. Chat started w/ ED care team and [facility] providers. [facility] will provide updates on ETA for assessment.

## 2023-12-17 NOTE — ED Notes (Signed)
 2 belongings bags placed in cabinet for 9-12, Hall [address]

## 2023-12-17 NOTE — Consult Note (Signed)
 [facility] Consult Note  Patient Name: [name] MRN: [id number] DOB: 03-24-1961 DATE OF Consult: 12/17/2023   TELEPSYCHIATRY ATTESTATION & CONSENT  As the provider for this telehealth consult, I attest that I verified the patient's identity using two separate identifiers, introduced myself to the patient, provided my credentials, disclosed my location, and performed this encounter via a HIPAA-compliant, real-time, face-to-face, two-way, interactive audio and video platform and with the full consent and agreement of the patient (or guardian as applicable.)  Patient physical location: [facility] . Telehealth provider physical location: home office in state of [place]  Video scheduled start time: 0800 pm (Central Time) Video end time: 0850  (Central Time)   PRIMARY PSYCHIATRIC DIAGNOSES (ICD-10 format preferred)  Persistent depressive disorder  Alcohol  and cannabis use disorder  Homelessness   RECOMMENDATIONS      Medication recommendations:  no change persist with outpatient meds  Non-Medication/therapeutic recommendations: sitter     We recommend inpatient psychiatric hospitalization when medically cleared. Patient  is under voluntary admission status at this time; please IVC if attempts to leave  hospital.  We recommend inpatient psychiatric hospitalization    Plan Post Discharge/Psychiatric Care Follow-up resources he has outpatietn psychiatrist as noted in chart last visit was 12-12-23  Follow-Up Telepsychiatry C/L services: We will sign off for now. Please re-consult our service if needed for any concerning changes in the patient's condition, discharge planning, or questions.  Communication:  Treatment team members (and family members if applicable) who  were involved in treatment/care discussions and planning, and with whom we spoke  or engaged with via secure text/chat, include the following:  ed doc  Thank you for involving us  in the care of this patient. If you  have any additional  questions or concerns, please call [phone] and ask for me or the provider on-call   CHIEF COMPLAINT/REASON FOR CONSULT  SI with plan  HISTORY OF PRESENT ILLNESS (HPI)    This is a 63 year old male Who has the past history of major depressive disorder recurrent, generalized anxiety disorder, hospitalizations and the psychiatric unit six times in the past year. he has a history of suicide attempts most notably in January 2025. it has been reported that he suffered PTSD and chronic depression starting at the age of 44. patient has had a multitude of medication trials which have not been optimal. exacerbating his situation is his unstable housing. patient has been referred to [facility] and [facility] for possible electric convulsive therapy. Also has a history of alcohol  use disorder which appears to be in remission . he has an  outpatient Psychiatric visit on 12/12/2023 .   patient now presents to the emergency room 5 days later today   brought in by the police patient calling the suicide hotline clinging that he will take overdose of all of his medications . part of patient's difficulties currently he is homeless having difficulty adjusting to such seeking inpatient psychiatric hospitalization .  Claims he is tired of being homeless living in his car x  5 days Now . 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 No agitation no aggression cooperative and compliant  No severe distress is noted  Suggest   Inpatient psych hospitalization  Sitter Resume his outpatient psych meds Consider psych testing for character pathology MMPI on repeat hospitalization  Possible strong secondary gain for his presentation due to homeless  Note working door dash to make ends meet Still using alcohol  and weed at times     PAST PSYCHIATRIC HISTORY  Many SA  Many psych hospitals Homelessness Suspect character pathology   Otherwise as per HPI above.  PAST MEDICAL HISTORY  Past Medical History:  Diagnosis Date   Cancer (HCC)    melanoma;  right ear   Depression    Diabetes mellitus    GERD (gastroesophageal reflux disease)    Hyperlipidemia    Hypertension    Metabolic syndrome    Obesity    PONV (postoperative nausea and vomiting)    YRS AGO WITHER ETHER      HOME MEDICATIONS  (Not in a hospital admission)       ALLERGIES  Allergies  Allergen Reactions   Other Other (See Comments)    Pollen - dries out right eye    SOCIAL & SUBSTANCE USE HISTORY  Social History   Socioeconomic History   Marital status: Single    Spouse name: Not on file   Number of children: Not on file   Years of education: Not on file   Highest education level: Bachelor's degree (e.g., BA, AB, BS)  Occupational History   Not on file  Tobacco Use   Smoking status: Former    Current packs/day: 0.00    Average packs/day: 0.5 packs/day for 5.0 years (2.5 ttl pk-yrs)    Types: Cigarettes    Start date: 05/20/1983    Quit date: 05/19/1988    Years since quitting: 35.6   Smokeless tobacco: Never   Tobacco comments:    quit in 1990  Vaping Use   Vaping status: Never Used  Substance and Sexual Activity   Alcohol  use: Yes    Alcohol /week: 5.0 standard drinks of alcohol     Types: 2 Glasses of wine, 3 Cans of beer per week    Comment: SOCIAL 3 TIMES PER WEEK 2 DRINKS AT A TIME   Drug use: Not Currently    Types: Marijuana    Comment: HX OF MONTHS AGO   Sexual activity: Never  Other Topics Concern   Not on  file  Social History Narrative   Single. 1 dog, 2 cats. Lives alone.       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 Enjoys dinner parties.    Social Drivers of Health   Financial Resource Strain: High Risk (08/09/2023)   Overall Financial Resource Strain (CARDIA)    Difficulty of Paying Living Expenses: Hard  Food Insecurity: Food Insecurity Present (11/07/2023)   Hunger Vital Sign    Worried About Running Out of Food in the Last Year: Sometimes true    Ran Out of Food in the Last Year: Sometimes true  Transportation Needs: No Transportation Needs (11/07/2023)   PRAPARE - Transportation  Lack of Transportation (Medical): No    Lack of Transportation (Non-Medical): No  Recent Concern: Transportation Needs - Unmet Transportation Needs (08/09/2023)   PRAPARE - Transportation    Lack of Transportation (Medical): Yes    Lack of Transportation (Non-Medical): Yes  Physical Activity: Sufficiently Active (08/09/2023)   Exercise Vital Sign    Days of Exercise per Week: 7 days    Minutes of Exercise per Session: 30 min  Stress: Stress Concern Present (08/09/2023)   [employer] of Occupational Health - Occupational Stress Questionnaire    Feeling of Stress : To some extent  Social Connections: Moderately Integrated (08/09/2023)   Social Connection and Isolation Panel    Frequency of Communication with Friends and Family: More than three times a week    Frequency of Social Gatherings with Friends and Family: Once a week    Attends Religious Services: More than 4 times per year    Active Member of [employer] or Organizations: Yes    Attends Engineer, structural: More than 4 times per year    Marital Status: Never married  Stil using alcohol  and  weed   FAMILY HISTORY  Family History  Problem Relation Age of Onset   Arthritis Mother    Stroke Father 34       heavy smoker 4-5 packs a day unfiltered   Schizophrenia Father        homeless and did not get medical care in time   Alcoholism Father    Colon cancer Neg Hx    Esophageal cancer Neg Hx    Rectal cancer Neg Hx    Stomach cancer Neg Hx    Colon polyps Neg Hx    Family Psychiatric History (if known):          MENTAL STATUS EXAM (MSE)  Mental Status Exam: General Appearance: Casual and Guarded  Orientation:  Full (Time, Place, and Person)  Memory:  intact  Concentration:  Concentration: Good  Recall:  Fair  Attention  Good  Eye Contact:  Good  Speech:  Clear and Coherent  Language:  Good  Volume:  Normal  Mood: depressed  Affect:  Congruent and Constricted  Thought Process:  Coherent  Thought Content:  Negative  Suicidal Thoughts:  Yes.  with intent/plan  Homicidal Thoughts:  No  Judgement:  Fair  Insight:  Fair  Psychomotor Activity:  Normal  Akathisia:  No  Fund of Knowledge:  Fair    Assets:  Communication Skills  Cognition:  WNL  ADL's:  Intact  AIMS (if indicated):          VITALS (IF TAKEN)   @VSR @   BP 139/85 (BP Location: Left Arm)   Pulse 69   Temp 97.6 F (36.4 C) (Oral)   Resp 20   SpO2 97%    LABS that are pertinent     ROS & ADDITIONAL FINDINGS  ROS: Notable for the following relevant positive findings: Psychiatric: per hpi Other notable positive ROS findings: per hpi  Additional findings:      Musculoskeletal:    [x]  No Abnormal Movements Observed        []  Impaired      Gait & Station:        [x]  Normal        []  Wheelchair/Walker          []  Laying/Sitting       Pain Screening:   [x]  Denies    []  Present--mild to moderate     []   Present--severe (will                             consider referral for ongoing evaluation and treatment)      Nutrition & Dental Concerns:no gross dental or  eating disorder   RISK  ASSESSMENT*  Is the patient experiencing any suicidal or homicidal ideations:     [x] YES        []  NO       Explain if yes: si with plan to overdose Protective factors considered for safety management: comes to the Ed when with difficulties  Risk factors/concerns considered for safety management: (check all that apply) []  Prior attempt                                      []  Hopelessness       []  Family history of suicide                    []  Impulsivity [x]  Depression                                         []  Aggression [x]  Substance abuse/dependence          []  Isolation []  Physical illness/chronic pain              []  Barriers to accessing treatment []  Recent loss                                        []  Unwillingness to seek help []  Access to lethal means                      [x]  Male gender []  Age over 67                                        [x]  Unmarried  Is there a safety management plan with the patient and treatment team to minimize risk factors and promote protective factors:     [x]  YES      []  NO            Explain: sitter       Based on my current evaluation and risk assessment of the patient at the time of this encounter, this patient is considered to be at:   []    Low Risk                      []   Moderate Risk                     [x]   High Risk  *RISK ASSESSMENT Risk assessment is a dynamic process; it is possible that this patient's condition, and risk level, may change. This should be re-evaluated and managed over time as appropriate. Please re-consult psychiatric consult services if additional assistance is needed in terms of risk assessment and management. If your team decides to discharge this patient, please advise the patient how to best access emergency psychiatric services, or to call 911, if their  condition worsens or they feel unsafe in any way.   CW Lonni Ivanoff, M. D., [facility]

## 2023-12-18 ENCOUNTER — Encounter (HOSPITAL_COMMUNITY): Payer: Self-pay | Admitting: Psychiatry

## 2023-12-18 ENCOUNTER — Inpatient Hospital Stay (HOSPITAL_COMMUNITY)
Admission: AD | Admit: 2023-12-18 | Discharge: 2023-12-23 | DRG: 885 | Disposition: A | Discharge status: Home / Self Care | Source: Intra-hospital

## 2023-12-18 DIAGNOSIS — E1169 Type 2 diabetes mellitus with other specified complication: Secondary | ICD-10-CM | POA: Diagnosis present

## 2023-12-18 DIAGNOSIS — Z794 Long term (current) use of insulin: Secondary | ICD-10-CM | POA: Diagnosis not present

## 2023-12-18 DIAGNOSIS — E1165 Type 2 diabetes mellitus with hyperglycemia: Secondary | ICD-10-CM | POA: Diagnosis present

## 2023-12-18 DIAGNOSIS — Z6372 Alcoholism and drug addiction in family: Secondary | ICD-10-CM

## 2023-12-18 DIAGNOSIS — Z79899 Other long term (current) drug therapy: Secondary | ICD-10-CM | POA: Diagnosis not present

## 2023-12-18 DIAGNOSIS — F331 Major depressive disorder, recurrent, moderate: Secondary | ICD-10-CM | POA: Diagnosis not present

## 2023-12-18 DIAGNOSIS — E785 Hyperlipidemia, unspecified: Secondary | ICD-10-CM | POA: Diagnosis present

## 2023-12-18 DIAGNOSIS — Z5982 Transportation insecurity: Secondary | ICD-10-CM | POA: Diagnosis not present

## 2023-12-18 DIAGNOSIS — E1111 Type 2 diabetes mellitus with ketoacidosis with coma: Secondary | ICD-10-CM | POA: Diagnosis not present

## 2023-12-18 DIAGNOSIS — Z6835 Body mass index (BMI) 35.0-35.9, adult: Secondary | ICD-10-CM

## 2023-12-18 DIAGNOSIS — E669 Obesity, unspecified: Secondary | ICD-10-CM | POA: Diagnosis present

## 2023-12-18 DIAGNOSIS — I10 Essential (primary) hypertension: Secondary | ICD-10-CM | POA: Diagnosis present

## 2023-12-18 DIAGNOSIS — Z85820 Personal history of malignant melanoma of skin: Secondary | ICD-10-CM

## 2023-12-18 DIAGNOSIS — F411 Generalized anxiety disorder: Secondary | ICD-10-CM | POA: Diagnosis present

## 2023-12-18 DIAGNOSIS — R45851 Suicidal ideations: Secondary | ICD-10-CM | POA: Diagnosis present

## 2023-12-18 DIAGNOSIS — Z7984 Long term (current) use of oral hypoglycemic drugs: Secondary | ICD-10-CM | POA: Diagnosis not present

## 2023-12-18 DIAGNOSIS — Z818 Family history of other mental and behavioral disorders: Secondary | ICD-10-CM | POA: Diagnosis not present

## 2023-12-18 DIAGNOSIS — Z62810 Personal history of physical and sexual abuse in childhood: Secondary | ICD-10-CM

## 2023-12-18 DIAGNOSIS — F333 Major depressive disorder, recurrent, severe with psychotic symptoms: Secondary | ICD-10-CM | POA: Diagnosis present

## 2023-12-18 DIAGNOSIS — F1011 Alcohol abuse, in remission: Secondary | ICD-10-CM | POA: Diagnosis present

## 2023-12-18 DIAGNOSIS — Z5948 Other specified lack of adequate food: Secondary | ICD-10-CM

## 2023-12-18 DIAGNOSIS — F1210 Cannabis abuse, uncomplicated: Secondary | ICD-10-CM | POA: Diagnosis present

## 2023-12-18 DIAGNOSIS — F329 Major depressive disorder, single episode, unspecified: Secondary | ICD-10-CM | POA: Diagnosis present

## 2023-12-18 DIAGNOSIS — F4312 Post-traumatic stress disorder, chronic: Secondary | ICD-10-CM | POA: Diagnosis present

## 2023-12-18 DIAGNOSIS — Z5941 Food insecurity: Secondary | ICD-10-CM

## 2023-12-18 DIAGNOSIS — Z5986 Financial insecurity: Secondary | ICD-10-CM

## 2023-12-18 DIAGNOSIS — Z5902 Unsheltered homelessness: Secondary | ICD-10-CM | POA: Diagnosis not present

## 2023-12-18 DIAGNOSIS — Z87891 Personal history of nicotine dependence: Secondary | ICD-10-CM | POA: Diagnosis not present

## 2023-12-18 DIAGNOSIS — F332 Major depressive disorder, recurrent severe without psychotic features: Secondary | ICD-10-CM

## 2023-12-18 DIAGNOSIS — F4310 Post-traumatic stress disorder, unspecified: Secondary | ICD-10-CM | POA: Diagnosis present

## 2023-12-18 DIAGNOSIS — K219 Gastro-esophageal reflux disease without esophagitis: Secondary | ICD-10-CM | POA: Diagnosis present

## 2023-12-18 DIAGNOSIS — Y900 Blood alcohol level of less than 20 mg/100 ml: Secondary | ICD-10-CM | POA: Diagnosis present

## 2023-12-18 DIAGNOSIS — Z823 Family history of stroke: Secondary | ICD-10-CM

## 2023-12-18 DIAGNOSIS — Z8261 Family history of arthritis: Secondary | ICD-10-CM

## 2023-12-18 DIAGNOSIS — E119 Type 2 diabetes mellitus without complications: Secondary | ICD-10-CM

## 2023-12-18 DIAGNOSIS — Z811 Family history of alcohol abuse and dependence: Secondary | ICD-10-CM

## 2023-12-18 DIAGNOSIS — Z9151 Personal history of suicidal behavior: Secondary | ICD-10-CM

## 2023-12-18 LAB — GLUCOSE, CAPILLARY
Glucose-Capillary: 160 mg/dL — ABNORMAL HIGH (ref 70–99)
Glucose-Capillary: 159 mg/dL — ABNORMAL HIGH (ref 70–99)
Glucose-Capillary: 223 mg/dL — ABNORMAL HIGH (ref 70–99)
Glucose-Capillary: 210 mg/dL — ABNORMAL HIGH (ref 70–99)

## 2023-12-18 MED ORDER — LORAZEPAM 2 MG/ML IJ SOLN: 2.0000 mg | Freq: Three times a day (TID) | INTRAMUSCULAR | Status: DC | PRN

## 2023-12-18 MED ORDER — SERTRALINE HCL 50 MG PO TABS
150.0000 mg | ORAL_TABLET | Freq: Every day | ORAL | Status: DC
  Administered 2023-12-18: 150 mg via ORAL
  Filled 2023-12-18: qty 1

## 2023-12-18 MED ORDER — ATORVASTATIN CALCIUM 40 MG PO TABS
40.0000 mg | ORAL_TABLET | Freq: Every day | ORAL | Status: DC
  Administered 2023-12-18 – 2023-12-23 (×6): 40 mg via ORAL
  Filled 2023-12-18 (×6): qty 1

## 2023-12-18 MED ORDER — PRAZOSIN HCL 1 MG PO CAPS
1.0000 mg | ORAL_CAPSULE | Freq: Every day | ORAL | Status: DC
  Administered 2023-12-18 – 2023-12-22 (×5): 1 mg via ORAL
  Filled 2023-12-18 (×5): qty 1

## 2023-12-18 MED ORDER — LISINOPRIL 20 MG PO TABS
40.0000 mg | ORAL_TABLET | Freq: Every day | ORAL | Status: DC
Start: 2023-12-18 — End: 2023-12-23
  Administered 2023-12-18 – 2023-12-23 (×6): 40 mg via ORAL
  Filled 2023-12-18 (×6): qty 2

## 2023-12-18 MED ORDER — INSULIN ASPART 100 UNIT/ML IJ SOLN
0.0000 [IU] | Freq: Every day | INTRAMUSCULAR | Status: DC
  Administered 2023-12-18: 2 [IU] via SUBCUTANEOUS

## 2023-12-18 MED ORDER — METFORMIN HCL 500 MG PO TABS
1000.0000 mg | ORAL_TABLET | Freq: Two times a day (BID) | ORAL | Status: DC
  Administered 2023-12-18 – 2023-12-23 (×11): 1000 mg via ORAL
  Filled 2023-12-18 (×11): qty 2

## 2023-12-18 MED ORDER — BUPROPION HCL ER (SR) 100 MG PO TB12
100.0000 mg | ORAL_TABLET | Freq: Every day | ORAL | Status: DC
  Administered 2023-12-18 – 2023-12-19 (×2): 100 mg via ORAL
  Filled 2023-12-18 (×2): qty 1

## 2023-12-18 MED ORDER — IBUPROFEN 400 MG PO TABS
400.0000 mg | ORAL_TABLET | Freq: Four times a day (QID) | ORAL | Status: DC | PRN
  Administered 2023-12-19: 400 mg via ORAL
  Filled 2023-12-18: qty 1

## 2023-12-18 MED ORDER — HALOPERIDOL 5 MG PO TABS: 5.0000 mg | ORAL_TABLET | Freq: Three times a day (TID) | ORAL | Status: DC | PRN

## 2023-12-18 MED ORDER — HALOPERIDOL LACTATE 5 MG/ML IJ SOLN: 5.0000 mg | Freq: Three times a day (TID) | INTRAMUSCULAR | Status: DC | PRN

## 2023-12-18 MED ORDER — GLIPIZIDE ER 2.5 MG PO TB24
5.0000 mg | ORAL_TABLET | Freq: Two times a day (BID) | ORAL | Status: DC
  Administered 2023-12-18 – 2023-12-23 (×11): 5 mg via ORAL
  Filled 2023-12-18 (×11): qty 2

## 2023-12-18 MED ORDER — DIPHENHYDRAMINE HCL 50 MG/ML IJ SOLN: 50.0000 mg | Freq: Three times a day (TID) | INTRAMUSCULAR | Status: DC | PRN

## 2023-12-18 MED ORDER — ALUM & MAG HYDROXIDE-SIMETH 200-200-20 MG/5ML PO SUSP: 30.0000 mL | ORAL | Status: DC | PRN

## 2023-12-18 MED ORDER — ACETAMINOPHEN 325 MG PO TABS
650.0000 mg | ORAL_TABLET | Freq: Four times a day (QID) | ORAL | Status: DC | PRN
  Administered 2023-12-18 – 2023-12-23 (×8): 650 mg via ORAL
  Filled 2023-12-18 (×8): qty 2

## 2023-12-18 MED ORDER — HALOPERIDOL LACTATE 5 MG/ML IJ SOLN: 10.0000 mg | Freq: Three times a day (TID) | INTRAMUSCULAR | Status: DC | PRN

## 2023-12-18 MED ORDER — PANTOPRAZOLE SODIUM 20 MG PO TBEC
20.0000 mg | DELAYED_RELEASE_TABLET | Freq: Every day | ORAL | Status: DC
  Administered 2023-12-18 – 2023-12-23 (×6): 20 mg via ORAL
  Filled 2023-12-18 (×6): qty 1

## 2023-12-18 MED ORDER — DIPHENHYDRAMINE HCL 25 MG PO CAPS: 50.0000 mg | ORAL_CAPSULE | Freq: Three times a day (TID) | ORAL | Status: DC | PRN

## 2023-12-18 MED ORDER — ARIPIPRAZOLE 15 MG PO TABS
15.0000 mg | ORAL_TABLET | Freq: Every day | ORAL | Status: DC
  Administered 2023-12-18 – 2023-12-23 (×6): 15 mg via ORAL
  Filled 2023-12-18 (×6): qty 1

## 2023-12-18 MED ORDER — SERTRALINE HCL 100 MG PO TABS
200.0000 mg | ORAL_TABLET | Freq: Every day | ORAL | Status: DC
  Administered 2023-12-19 – 2023-12-23 (×5): 200 mg via ORAL
  Filled 2023-12-18 (×5): qty 2

## 2023-12-18 MED ORDER — AMLODIPINE BESYLATE 5 MG PO TABS
10.0000 mg | ORAL_TABLET | Freq: Every day | ORAL | Status: DC
  Administered 2023-12-18 – 2023-12-23 (×6): 10 mg via ORAL
  Filled 2023-12-18 (×6): qty 2

## 2023-12-18 MED ORDER — TRAZODONE HCL 50 MG PO TABS
50.0000 mg | ORAL_TABLET | Freq: Every evening | ORAL | Status: DC | PRN
  Administered 2023-12-18 – 2023-12-22 (×5): 50 mg via ORAL
  Filled 2023-12-18 (×5): qty 1

## 2023-12-18 MED ORDER — INSULIN ASPART 100 UNIT/ML IJ SOLN
0.0000 [IU] | Freq: Three times a day (TID) | INTRAMUSCULAR | Status: DC
  Administered 2023-12-18 – 2023-12-20 (×4): 3 [IU] via SUBCUTANEOUS
  Administered 2023-12-20: 2 [IU] via SUBCUTANEOUS
  Administered 2023-12-21 – 2023-12-22 (×4): 3 [IU] via SUBCUTANEOUS
  Administered 2023-12-22 – 2023-12-23 (×2): 2 [IU] via SUBCUTANEOUS

## 2023-12-18 NOTE — Progress Notes (Signed)
(  Sleep Hours) - 1.25 (Any PRNs that were needed, meds refused, or side effects to meds)- none (Any disturbances and when (visitation, over night)- none (Concerns raised by the patient)- none  (SI/HI/AVH)- Denies

## 2023-12-18 NOTE — Group Note (Signed)
 Date:  12/18/2023 Time:  10:11 AM  Group Topic/Focus:  Goals Group:   The focus of this group is to help patients establish daily goals to achieve during treatment and discuss how the patient can incorporate goal setting into their daily lives to aide in recovery.    Participation Level:  Active  Participation Quality:  Attentive  Affect:  Appropriate  Cognitive:  Appropriate  Insight: Appropriate  Engagement in Group:  Engaged  Modes of Intervention:  Education  Additional Comments:    [name] 12/18/2023, 10:11 AM

## 2023-12-18 NOTE — ED Notes (Signed)
Safe transport called 

## 2023-12-18 NOTE — Group Note (Unsigned)
 Date:  12/18/2023 Time:  8:51 PM  Group Topic/Focus:  Wrap-Up Group:   The focus of this group is to help patients review their daily goal of treatment and discuss progress on daily workbooks.     Participation Level:  {B[facility] PARTICIPATION OZCZO:77735}  Participation Quality:  {BHH PARTICIPATION QUALITY:22265}  Affect:  {BHH AFFECT:22266}  Cognitive:  {BHH COGNITIVE:22267}  Insight: {BHH Insight2:20797}  Engagement in Group:  {BHH ENGAGEMENT IN HMNLE:77731}  Modes of Intervention:  {BHH MODES OF INTERVENTION:22269}  Additional Comments:  ***  Stephane Niemann A Prisha Hiley 12/18/2023, 8:51 PM

## 2023-12-18 NOTE — B[facility] Suicide Risk Assessment (Signed)
 [facility] INPATIENT:  Family/Significant Other Suicide Prevention Education  Suicide Prevention Education:  Patient Refusal for Family/Significant Other Suicide Prevention Education: The patient [name] has refused to provide written consent for family/significant other to be provided Family/Significant Other Suicide Prevention Education during admission and/or prior to discharge.  Physician notified.    CSW educated patient on Suicide Prevention Education on 12/18/2023:  CSW educated the patient about suicide, those who are risk, the warning signs and what patient can do. Patient stated that he undrestand and agreed to the education.    Denaja Verhoeven O Laelah Siravo 12/18/2023, 3:35 PM

## 2023-12-18 NOTE — Group Note (Unsigned)
 Date:  12/18/2023 Time:  10:53 AM  Group Topic/Focus:  Goals Group:   The focus of this group is to help patients establish daily goals to achieve during treatment and discuss how the patient can incorporate goal setting into their daily lives to aide in recovery.     Participation Level:  {BHH PARTICIPATION OZCZO:77735}  Participation Quality:  {BHH PARTICIPATION QUALITY:22265}  Affect:  {BHH AFFECT:22266}  Cognitive:  {BHH COGNITIVE:22267}  Insight: {BHH Insight2:20797}  Engagement in Group:  {BHH ENGAGEMENT IN HMNLE:77731}  Modes of Intervention:  {BHH MODES OF INTERVENTION:22269}  Additional Comments:  ***  [name] 12/18/2023, 10:53 AM

## 2023-12-18 NOTE — Progress Notes (Signed)
 [name] is a 63 y.o. male voluntarily admitted for suicide ideation with a plan to overdose. Pt has a past history of major depressive disorder recurrent, generalized anxiety disorder, hospitalizations and the psychiatric unit six times in the past year. he has a history of suicide attempts in January 2025. it has been reported that he suffered PTSD and chronic depression starting at the age of 6. patient has had a multitude of medication trials which have not been optimal. Also has a history of alcohol  use disorder which appears to be in remission . Pt has been calm and cooperative with admission process, alert and oriented, denies HI, reports passive SI, verbally contracted for safety. Consents signed, skin/belongings search completed and pt oriented to unit. Pt stable at this time. Pt given the opportunity to express concerns and ask questions. Pt given toiletries. Will continue to monitor.

## 2023-12-18 NOTE — Group Note (Unsigned)
 Date:  12/18/2023 Time:  9:58 AM  Group Topic/Focus:  Goals Group:   The focus of this group is to help patients establish daily goals to achieve during treatment and discuss how the patient can incorporate goal setting into their daily lives to aide in recovery.     Participation Level:  {BHH PARTICIPATION OZCZO:77735}  Participation Quality:  {BHH PARTICIPATION QUALITY:22265}  Affect:  {BHH AFFECT:22266}  Cognitive:  {BHH COGNITIVE:22267}  Insight: {BHH Insight2:20797}  Engagement in Group:  {BHH ENGAGEMENT IN HMNLE:77731}  Modes of Intervention:  {BHH MODES OF INTERVENTION:22269}  Additional Comments:  ***  [name] 12/18/2023, 9:58 AM

## 2023-12-18 NOTE — Group Note (Signed)
 Date:  12/18/2023 Time:  4:58 PM  Group Topic/Focus:  Self Care:   The focus of this group is to help patients understand the importance of sleep hygiene and self-care in order to improve or restore emotional, physical, spiritual, interpersonal, and financial health.    Participation Level:  Active  Participation Quality:  Appropriate, Attentive, and Sharing  Affect:  Appropriate  Cognitive:  Alert, Appropriate, and Oriented  Insight: Appropriate  Engagement in Group:  Engaged  Modes of Intervention:  Discussion   [name] 12/18/2023, 4:58 PM
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
## 2023-12-18 NOTE — Group Note (Addendum)
 Date:  12/18/2023 Time:  10:33 PM  Group Topic/Focus:  Wrap-Up Group:   The focus of this group is to help patients review their daily goal of treatment and discuss progress on daily workbooks.    Participation Level:  Active  Participation Quality:  Appropriate and Attentive  Affect:  Appropriate  Cognitive:  Alert  Insight: Appropriate and Good  Engagement in Group:  Engaged  Modes of Intervention:  Discussion and Education  Additional Comments:  Pt attended and participated in wrap up group this evening and rated their day a 4/10. Pt states that they are still feeling depressed, but states that their medications as well as talking with their peers are helping them. While they are here, pt would benefit from having their medications managed so that the pt will feel more stabilized. Pt has no concerns to relay at this time  [name] Pouch 12/18/2023, 10:33 PM

## 2023-12-18 NOTE — Tx Team (Signed)
 Initial Treatment Plan 12/18/2023 5:21 AM [name] [name] FMW:[id number]    PATIENT STRESSORS: Financial difficulties   Health problems   Marital or family conflict   Occupational concerns   Substance abuse   Traumatic event     PATIENT STRENGTHS: Active sense of humor  Communication skills  Motivation for treatment/growth  Work skills    PATIENT IDENTIFIED PROBLEMS: Depression  Anxiety   Stop having suicide thoughts   Get my life more settled               DISCHARGE CRITERIA:  Ability to meet basic life and health needs Adequate post-discharge living arrangements Improved stabilization in mood, thinking, and/or behavior Medical problems require only outpatient monitoring  PRELIMINARY DISCHARGE PLAN: Attend PHP/IOP Outpatient therapy Placement in alternative living arrangements  PATIENT/FAMILY INVOLVEMENT: This treatment plan has been presented to and reviewed with the patient, [name], and/or family member.  The patient and family have been given the opportunity to ask questions and make suggestions.  Dustin MALVA Edin, RN 12/18/2023, 5:21 AM

## 2023-12-18 NOTE — Plan of Care (Signed)
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
## 2023-12-18 NOTE — Group Note (Unsigned)
 Date:  12/18/2023 Time:  9:50 AM  Group Topic/Focus:  Goals Group:   The focus of this group is to help patients establish daily goals to achieve during treatment and discuss how the patient can incorporate goal setting into their daily lives to aide in recovery.     Participation Level:  {BHH PARTICIPATION OZCZO:77735}  Participation Quality:  {BHH PARTICIPATION QUALITY:22265}  Affect:  {BHH AFFECT:22266}  Cognitive:  {BHH COGNITIVE:22267}  Insight: {BHH Insight2:20797}  Engagement in Group:  {BHH ENGAGEMENT IN HMNLE:77731}  Modes of Intervention:  {BHH MODES OF INTERVENTION:22269}  Additional Comments:  ***  [name] 12/18/2023, 9:50 AM

## 2023-12-18 NOTE — Group Note (Unsigned)
 Date:  12/18/2023 Time:  10:50 AM  Group Topic/Focus:  Goals Group:   The focus of this group is to help patients establish daily goals to achieve during treatment and discuss how the patient can incorporate goal setting into their daily lives to aide in recovery.     Participation Level:  {BHH PARTICIPATION OZCZO:77735}  Participation Quality:  {BHH PARTICIPATION QUALITY:22265}  Affect:  {BHH AFFECT:22266}  Cognitive:  {BHH COGNITIVE:22267}  Insight: {BHH Insight2:20797}  Engagement in Group:  {BHH ENGAGEMENT IN HMNLE:77731}  Modes of Intervention:  {BHH MODES OF INTERVENTION:22269}  Additional Comments:  ***  [name] 12/18/2023, 10:50 AM

## 2023-12-18 NOTE — Plan of Care (Signed)
   Problem: Education: Goal: Knowledge of [facility] Education information/materials will improve Outcome: Progressing   Problem: Activity: Goal: Interest or engagement in activities will improve Outcome: Progressing   Problem: Coping: Goal: Ability to verbalize frustrations and anger appropriately will improve Outcome: Progressing   Problem: Safety: Goal: Periods of time without injury will increase Outcome: Progressing

## 2023-12-18 NOTE — Progress Notes (Signed)
   12/18/23 0945  Psych Admission Type (Psych Patients Only)  Admission Status Voluntary  Psychosocial Assessment  Patient Complaints Depression  Eye Contact Fair  Facial Expression Sad;Anxious  Affect Flat  Speech Logical/coherent  Interaction Assertive  Motor Activity Slow  Appearance/Hygiene In scrubs  Behavior Characteristics Appropriate to situation  Mood Pleasant  Thought Process  Coherency WDL  Content WDL  Delusions None reported or observed  Perception WDL  Hallucination None reported or observed  Judgment Poor  Confusion None  Danger to Self  Current suicidal ideation? Passive  Description of Suicide Plan OD on meds  Self-Injurious Behavior No self-injurious ideation or behavior indicators observed or expressed   Agreement Not to Harm Self Yes  Description of Agreement verbal  Danger to Others  Danger to Others None reported or observed

## 2023-12-19 ENCOUNTER — Encounter (HOSPITAL_COMMUNITY): Payer: Self-pay

## 2023-12-19 LAB — GLUCOSE, CAPILLARY
Glucose-Capillary: 193 mg/dL — ABNORMAL HIGH (ref 70–99)
Glucose-Capillary: 148 mg/dL — ABNORMAL HIGH (ref 70–99)
Glucose-Capillary: 114 mg/dL — ABNORMAL HIGH (ref 70–99)
Glucose-Capillary: 137 mg/dL — ABNORMAL HIGH (ref 70–99)

## 2023-12-19 MED ORDER — BUPROPION HCL ER (SR) 150 MG PO TB12
150.0000 mg | ORAL_TABLET | Freq: Every day | ORAL | Status: DC
Start: 2023-12-20 — End: 2023-12-23
  Administered 2023-12-20 – 2023-12-23 (×4): 150 mg via ORAL
  Filled 2023-12-19 (×4): qty 1

## 2023-12-19 MED ORDER — POLYVINYL ALCOHOL 1.4 % OP SOLN
1.0000 [drp] | OPHTHALMIC | Status: DC | PRN
  Filled 2023-12-19: qty 15

## 2023-12-19 NOTE — Group Note (Signed)
 Date:  12/19/2023 Time:  9:16 PM  Group Topic/Focus:  Wrap-Up Group:   The focus of this group is to help patients review their daily goal of treatment and discuss progress on daily workbooks. Alcoholics Anonymous (AA) Meeting    Participation Level:  Did Not Attend  Participation Quality:  N/A  Affect:  N/A  Cognitive:  N/A  Insight: None  Engagement in Group:  N/A  Modes of Intervention:  N/A  Additional Comments:  Patient did not attend  [name] 12/19/2023, 9:16 PM

## 2023-12-19 NOTE — Progress Notes (Signed)
   12/19/23 0800  Psych Admission Type (Psych Patients Only)  Admission Status Voluntary  Psychosocial Assessment  Patient Complaints Depression  Eye Contact Fair  Facial Expression Sad  Affect Anxious;Flat  Speech Logical/coherent  Interaction Assertive  Motor Activity Slow  Appearance/Hygiene In scrubs  Behavior Characteristics Appropriate to situation  Mood Pleasant  Thought Process  Coherency WDL  Content WDL  Delusions None reported or observed  Perception WDL  Hallucination None reported or observed  Judgment Poor  Confusion None  Danger to Self  Current suicidal ideation? Passive  Description of Suicide Plan No plan  Self-Injurious Behavior No self-injurious ideation or behavior indicators observed or expressed   Agreement Not to Harm Self Yes  Description of Agreement verbal  Danger to Others  Danger to Others None reported or observed

## 2023-12-19 NOTE — Group Note (Signed)
 Recreation Therapy Group Note   Group Topic:Health and Wellness  Group Date: 12/19/2023 Start Time: 0932 End Time: 0958 Facilitators: Verta Riedlinger-McCall, LRT,CTRS Location: [address] Dayroom   Group Topic: Wellness  Goal Area(s) Addresses:  Patient will define components of whole wellness. Patient will verbalize benefit of whole wellness.  Behavioral Response: Engaged  Intervention: Music  Activity: Exercise. LRT and patients discussed the importance of physical fitness. Patients then took turns leading the group in the exercises of their choosing. Patients completed two rounds of exercise. Patients could take breaks and get water if needed.   Education: Wellness, Building control surveyor.   Education Outcome: Acknowledges education/In group clarification offered/Needs additional education.    Affect/Mood: Appropriate   Participation Level: Engaged   Participation Quality: Independent   Behavior: Appropriate   Speech/Thought Process: Focused   Insight: Good   Judgement: Good   Modes of Intervention: Music   Patient Response to Interventions:  Engaged   Education Outcome:  In group clarification offered    Clinical Observations/Individualized Feedback: Pt attended and was fully engaged in group session.     Plan: Continue to engage patient in RT group sessions 2-3x/week.   Mikail Goostree-McCall, LRT,CTRS 12/19/2023 12:57 PM

## 2023-12-19 NOTE — Group Note (Signed)
 Occupational Therapy Group Note  Group Topic:Coping Skills  Group Date: 12/19/2023 Start Time: 1500 End Time: 1533 Facilitators: Dot [place] MATSU, OT   Group Description: Group encouraged increased engagement and participation through discussion and activity focused on Coping Ahead. Patients were split up into teams and selected a card from a stack of positive coping strategies. Patients were instructed to act out/charade the coping skill for other peers to guess and receive points for their team. Discussion followed with a focus on identifying additional positive coping strategies and patients shared how they were going to cope ahead over the weekend while continuing hospitalization stay.  Therapeutic Goal(s): Identify positive vs negative coping strategies. Identify coping skills to be used during hospitalization vs coping skills outside of hospital/at home Increase participation in therapeutic group environment and promote engagement in treatment   Participation Level: Engaged   Participation Quality: Independent   Behavior: Appropriate   Speech/Thought Process: Relevant   Affect/Mood: Appropriate   Insight: Fair   Judgement: Fair      Modes of Intervention: Education  Patient Response to Interventions:  Attentive   Plan: Continue to engage patient in OT groups 2 - 3x/week.  12/19/2023  [place] MATSU Dot, OT  [name], OT

## 2023-12-19 NOTE — BH IP Treatment Plan (Signed)
 Interdisciplinary Treatment and Diagnostic Plan Update  12/19/2023 Time of Session: 11:25AM [name] MRN: [id number]  Principal Diagnosis: Major depressive disorder, recurrent episode, severe, with psychotic behavior (HCC)  Secondary Diagnoses: Principal Problem:   Major depressive disorder, recurrent episode, severe, with psychotic behavior (HCC) Active Problems:   Type 2 diabetes mellitus (HCC)   Hyperlipidemia associated with type 2 diabetes mellitus (HCC)   PTSD (post-traumatic stress disorder)   Current Medications:  Current Facility-Administered Medications  Medication Dose Route Frequency Provider Last Rate Last Admin   acetaminophen  (TYLENOL ) tablet 650 mg  650 mg Oral Q6H PRN Bobbitt, Shalon E, NP   650 mg at 12/18/23 1715   alum & mag hydroxide-simeth (MAALOX/MYLANTA) 200-200-20 MG/5ML suspension 30 mL  30 mL Oral Q4H PRN Bobbitt, Shalon E, NP       amLODipine  (NORVASC ) tablet 10 mg  10 mg Oral Daily Bobbitt, Shalon E, NP   10 mg at 12/19/23 9177   ARIPiprazole  (ABILIFY ) tablet 15 mg  15 mg Oral Daily Bobbitt, Shalon E, NP   15 mg at 12/19/23 9177   atorvastatin  (LIPITOR) tablet 40 mg  40 mg Oral Daily Bobbitt, Shalon E, NP   40 mg at 12/19/23 9176   buPROPion  ER (WELLBUTRIN  SR) 12 hr tablet 100 mg  100 mg Oral Daily Prentis Kitchens A, DO   100 mg at 12/19/23 9176   haloperidol  (HALDOL ) tablet 5 mg  5 mg Oral TID PRN Bobbitt, Shalon E, NP       And   diphenhydrAMINE  (BENADRYL ) capsule 50 mg  50 mg Oral TID PRN Bobbitt, Shalon E, NP       haloperidol  lactate (HALDOL ) injection 5 mg  5 mg Intramuscular TID PRN Bobbitt, Shalon E, NP       And   diphenhydrAMINE  (BENADRYL ) injection 50 mg  50 mg Intramuscular TID PRN Bobbitt, Shalon E, NP       And   LORazepam  (ATIVAN ) injection 2 mg  2 mg Intramuscular TID PRN Bobbitt, Shalon E, NP       haloperidol  lactate (HALDOL ) injection 10 mg  10 mg Intramuscular TID PRN Bobbitt, Shalon E, NP       And   diphenhydrAMINE  (BENADRYL )  injection 50 mg  50 mg Intramuscular TID PRN Bobbitt, Shalon E, NP       And   LORazepam  (ATIVAN ) injection 2 mg  2 mg Intramuscular TID PRN Bobbitt, Shalon E, NP       glipiZIDE  (GLUCOTROL  XL) 24 hr tablet 5 mg  5 mg Oral BID AC Bobbitt, Shalon E, NP   5 mg at 12/19/23 9177   ibuprofen  (ADVIL ) tablet 400 mg  400 mg Oral Q6H PRN Prentis Kitchens A, DO   400 mg at 12/19/23 0826   insulin  aspart (novoLOG ) injection 0-15 Units  0-15 Units Subcutaneous TID WC Bouchard, Marc A, DO   3 Units at 12/19/23 0615   insulin  aspart (novoLOG ) injection 0-5 Units  0-5 Units Subcutaneous QHS Prentis Kitchens A, DO   2 Units at 12/18/23 2058   lisinopril  (ZESTRIL ) tablet 40 mg  40 mg Oral Daily Prentis Kitchens A, DO   40 mg at 12/19/23 9178   metFORMIN  (GLUCOPHAGE ) tablet 1,000 mg  1,000 mg Oral BID WC Bobbitt, Shalon E, NP   1,000 mg at 12/19/23 9177   pantoprazole  (PROTONIX ) EC tablet 20 mg  20 mg Oral Daily Bobbitt, Shalon E, NP   20 mg at 12/19/23 9178   prazosin  (MINIPRESS ) capsule 1 mg  1 mg Oral QHS Bobbitt, Shalon E, NP   1 mg at 12/18/23 2058   sertraline  (ZOLOFT ) tablet 200 mg  200 mg Oral Daily Prentis Kitchens A, DO   200 mg at 12/19/23 9177   traZODone  (DESYREL ) tablet 50 mg  50 mg Oral QHS PRN Bobbitt, Shalon E, NP   50 mg at 12/18/23 2058   PTA Medications: Medications Prior to Admission  Medication Sig Dispense Refill Last Dose/Taking   acetaminophen  (TYLENOL ) 650 MG CR tablet Take 1,300 mg by mouth every 8 (eight) hours as needed for pain.      amLODipine  (NORVASC ) 10 MG tablet Take 10 mg by mouth daily.      ARIPiprazole  (ABILIFY ) 15 MG tablet Take 1 tablet (15 mg total) by mouth daily. 30 tablet 1    atorvastatin  (LIPITOR) 40 MG tablet Take 1 tablet (40 mg total) by mouth daily. 90 tablet 2    glipiZIDE  (GLUCOTROL  XL) 5 MG 24 hr tablet Take 1 tablet (5 mg total) by mouth 2 (two) times daily before a meal. 60 tablet 0    hydrOXYzine  (ATARAX ) 25 MG tablet Take 1 tablet (25 mg total) by mouth 3 (three)  times daily as needed for anxiety. (Patient not taking: Reported on 12/17/2023) 30 tablet 0    insulin  lispro (HUMALOG ) 100 UNIT/ML injection Inject 0-15 Units into the skin 4 (four) times daily -  before meals and at bedtime. Inject 0-15 Units into the skin 3 (three) times daily with meals AND 0-5 Units at bedtime. (Patient not taking: Reported on 12/17/2023)      lansoprazole  (PREVACID ) 15 MG capsule Take 15 mg by mouth daily.      MELATONIN PO Take by mouth at bedtime as needed (sleep).      metFORMIN  (GLUCOPHAGE ) 1000 MG tablet Take 1 tablet (1,000 mg total) by mouth 2 (two) times daily with a meal. (Patient not taking: Reported on 12/17/2023) 60 tablet 0    prazosin  (MINIPRESS ) 1 MG capsule Take 1 capsule (1 mg total) by mouth at bedtime. 30 capsule 1    sertraline  (ZOLOFT ) 50 MG tablet Take 3 tablets (150 mg total) by mouth daily. 90 tablet 1    traZODone  (DESYREL ) 50 MG tablet Take 1 tablet (50 mg total) by mouth at bedtime as needed for sleep. 30 tablet 1     Patient Stressors: Financial difficulties   Health problems   Marital or family conflict   Occupational concerns   Substance abuse   Traumatic event    Patient Strengths: Active sense of humor  Communication skills  Motivation for treatment/growth  Work skills   Treatment Modalities: Medication Management, Group therapy, Case management,  1 to 1 session with clinician, Psychoeducation, Recreational therapy.   Physician Treatment Plan for Primary Diagnosis: Major depressive disorder, recurrent episode, severe, with psychotic behavior (HCC) Long Term Goal(s): Improvement in symptoms so as ready for discharge   Short Term Goals: Ability to verbalize feelings will improve Ability to disclose and discuss suicidal ideas Ability to identify and develop effective coping behaviors will improve Ability to maintain clinical measurements within normal limits will improve  Medication Management: Evaluate patient's response, side  effects, and tolerance of medication regimen.  Therapeutic Interventions: 1 to 1 sessions, Unit Group sessions and Medication administration.  Evaluation of Outcomes: Not Progressing  Physician Treatment Plan for Secondary Diagnosis: Principal Problem:   Major depressive disorder, recurrent episode, severe, with psychotic behavior (HCC) Active Problems:   Type 2 diabetes mellitus (HCC)  Hyperlipidemia associated with type 2 diabetes mellitus (HCC)   PTSD (post-traumatic stress disorder)  Long Term Goal(s): Improvement in symptoms so as ready for discharge   Short Term Goals: Ability to verbalize feelings will improve Ability to disclose and discuss suicidal ideas Ability to identify and develop effective coping behaviors will improve Ability to maintain clinical measurements within normal limits will improve     Medication Management: Evaluate patient's response, side effects, and tolerance of medication regimen.  Therapeutic Interventions: 1 to 1 sessions, Unit Group sessions and Medication administration.  Evaluation of Outcomes: Not Progressing   RN Treatment Plan for Primary Diagnosis: Major depressive disorder, recurrent episode, severe, with psychotic behavior (HCC) Long Term Goal(s): Knowledge of disease and therapeutic regimen to maintain health will improve  Short Term Goals: Ability to remain free from injury will improve, Ability to verbalize frustration and anger appropriately will improve, Ability to demonstrate self-control, Ability to participate in decision making will improve, Ability to verbalize feelings will improve, and Ability to disclose and discuss suicidal ideas  Medication Management: RN will administer medications as ordered by provider, will assess and evaluate patient's response and provide education to patient for prescribed medication. RN will report any adverse and/or side effects to prescribing provider.  Therapeutic Interventions: 1 on 1 counseling  sessions, Psychoeducation, Medication administration, Evaluate responses to treatment, Monitor vital signs and CBGs as ordered, Perform/monitor CIWA, COWS, AIMS and Fall Risk screenings as ordered, Perform wound care treatments as ordered.  Evaluation of Outcomes: Not Progressing   LCSW Treatment Plan for Primary Diagnosis: Major depressive disorder, recurrent episode, severe, with psychotic behavior (HCC) Long Term Goal(s): Safe transition to appropriate next level of care at discharge, Engage patient in therapeutic group addressing interpersonal concerns.  Short Term Goals: Engage patient in aftercare planning with referrals and resources, Increase social support, Increase ability to appropriately verbalize feelings, Increase emotional regulation, Facilitate acceptance of mental health diagnosis and concerns, and Increase skills for wellness and recovery  Therapeutic Interventions: Assess for all discharge needs, 1 to 1 time with Social worker, Explore available resources and support systems, Assess for adequacy in community support network, Educate family and significant other(s) on suicide prevention, Complete Psychosocial Assessment, Interpersonal group therapy.  Evaluation of Outcomes: Not Progressing   Progress in Treatment: Attending groups: Yes. Participating in groups: Yes. Taking medication as prescribed: Yes. Toleration medication: Yes. Family/Significant other contact made: No, will contact:  pt declined consent to contact family/friends Patient understands diagnosis: Yes. Discussing patient identified problems/goals with staff: Yes. Medical problems stabilized or resolved: Yes. Denies suicidal/homicidal ideation: Yes. Issues/concerns per patient self-inventory: No.  Patient Goals:  work on hope  Discharge Plan or Barriers: Pt likely to discharge to shelter once stable.   Reason for Continuation of Hospitalization: Depression Medication stabilization  Estimated Length  of Stay: 3-5 days  Last 3 [place] Suicide Severity Risk Score: Flowsheet Row [facility] (Current[facility] from 12[facility]17[facility]2023 in [facility] ED from 12/09/2023 in [facility]  C-SSRS RISK CATEGORY High Risk High Risk High Risk    Last [facility] 2/9 Scores:    12/12/2023    2:55 PM 03/10/2023   10:23 AM 02/22/2023    3:28 PM  Depression screen PHQ 2/9  Decreased Interest 2 0 2  Down, Depressed, Hopeless 3 2 2   PHQ - 2 Score 5 2 4   Altered sleeping 2 3 1   Tired, decreased energy 2  1 2  Change in appetite 2 1 1   Feeling bad or failure about yourself  3 3 2   Trouble concentrating 2 1 1   Moving slowly or fidgety/restless 1 0 1  Suicidal thoughts 3 2 2   PHQ-9 Score 20 13 14   Difficult doing work/chores Very difficult Somewhat difficult Somewhat difficult    Scribe for Treatment Team: Candy CHRISTELLA Bruns, ISRA[place] 12/19/2023 1:11 PM

## 2023-12-19 NOTE — Plan of Care (Signed)
  Problem: Education: Goal: Knowledge of [place] [facility] Education information/materials will improve Outcome: Completed/Met Goal: Mental status will improve Outcome: Progressing   Problem: Activity: Goal: Interest or engagement in activities will improve Outcome: Progressing   Problem: Coping: Goal: Ability to demonstrate self-control will improve Outcome: Progressing

## 2023-12-19 NOTE — Progress Notes (Signed)
 [facility] Inpatient Psychiatry Progress Note  Date: 12/19/23 Patient: [name] MRN: [id number]  Assessment and Plan: Mr. Deylan Canterbury is a 63 year old male with severe, chronic, treatment-resistant MDD, GAD, and PTSD, two remote suicide attempts and multiple recent psychiatric hospitalizations, and significant psychosocial stressors (homelessness, financial strain, food insecurity). He has a history of substance use (alcohol , cannabis), but no evidence of acute intoxication or withdrawal. He is currently at high risk for suicide given chronic SI, recent plan, prior attempts, and lack of stable housing.    Many of his current symptoms appear directly related to lack of housing and ongoing financial stressors.   # MDD, recurrent, severe, w/o psychotic features # PTSD - sertraline  to 200 mg daily - bupropion  SR 150 mg daily - Continue Abilify  15 mg daily - Trazodone  50 mg at bedtime - prazosin  1 mg qhs   # HTN # HLD # DM2 - Atorvastatin  40 mg daily - lisinopril  40 mg daily - amlodipine  10 mg daily - Insulin  sliding scale - Glipizide  XR 5 mg daily - metformin  1000 mg BID w/meals  Risk Assessment - High  Discharge Planning Barriers to discharge: Suicidal ideation Estimated length of stay: 7-10 days Predicted Discharge location: Homelessness     Interval History and update: Chart reviewed. No significant events overnight. Patient has been med compliant. He tolerated bupropion  SR today without any significant side effects. He reported today that he felt depressed and a waste of space. He endorsed anhedonia, hopelessness, and thoughts of being better off dead. He reported sleeping poorly and fitfully, and is tired today. He expressed concern over his vehicle being stolen and stated that if it would be stolen I'd have nothing to live for. That is my means of transportation, money, and my house. I'd be done for.      Physical Exam MSK/Neuro -  Normal gait and station  Mental Status Exam Appearance - Casually dressed, appropriate hygiene and grooming  Attitude - Calm, polite, not guarded Speech - normal volume, prosody, inflection Mood - Depressed Affect - Constricted Thought Process - LLGD Thought Content - No delusional TC expressed SI/HI - Ongoing passive SI Perceptions - Denies AVH; not RIS Judgement/Insight - Good  Fund of knowledge - WNL Language - No impairments      Lab Results:  Admission on 12/18/2023  Component Date Value Ref Range Status   Glucose-Capillary 12/18/2023 160 (H)  70 - 99 mg/dL Final   Glucose-Capillary 12/18/2023 159 (H)  70 - 99 mg/dL Final   Glucose-Capillary 12/18/2023 223 (H)  70 - 99 mg/dL Final   Glucose-Capillary 12/18/2023 210 (H)  70 - 99 mg/dL Final   Comment 1 90/78/7974 Notify RN   Final   Glucose-Capillary 12/19/2023 193 (H)  70 - 99 mg/dL Final   Comment 1 90/77/7974 Notify RN   Final   Comment 2 12/19/2023 Document in Chart   Final   Glucose-Capillary 12/19/2023 148 (H)  70 - 99 mg/dL Final     Vitals: Blood pressure 137/67, pulse 65, temperature 97.9 F (36.6 C), temperature source Oral, resp. rate 12, height 5' 8 (1.727 m), weight 105 kg, SpO2 98%.    Oliva DELENA Salmon, DO

## 2023-12-19 NOTE — BHH Group Notes (Signed)
 Spirituality Group   Group Goal: Support / Education around grief and loss    Group Description: Following introductions and group rules, group members engaged in facilitated group dialog and support around topic of loss, with particular support around experiences of loss in their lives. Group members identified types of loss (relationships / self / things) as well as patterns, circumstances, and changes that precipitate loss. Reflection invited on thoughts / feelings around loss, normalized grief responses, and recognized variety in grief experience. Group noted Worden's four tasks of grief in discussion. Group drew on Adlerian / Rogerian, narrative, MI, with [employer]'s group therapy as a primary framework.   Observations: [name] was an active participant in the group discussion. He processed meaningfully around loss of his dog, [name], and healing through new activity (volunteer at shelter).  [name] L. [name] HERO.Div

## 2023-12-19 NOTE — Progress Notes (Signed)
(  Sleep Hours) - 8.75 (Any PRNs that were needed, meds refused, or side effects to meds)- none (Any disturbances and when (visitation, over night)-n/a (Concerns raised by the patient)- none (SI/HI/AVH)- denies

## 2023-12-20 LAB — GLUCOSE, CAPILLARY
Glucose-Capillary: 121 mg/dL — ABNORMAL HIGH (ref 70–99)
Glucose-Capillary: 168 mg/dL — ABNORMAL HIGH (ref 70–99)
Glucose-Capillary: 185 mg/dL — ABNORMAL HIGH (ref 70–99)
Glucose-Capillary: 180 mg/dL — ABNORMAL HIGH (ref 70–99)

## 2023-12-20 NOTE — Plan of Care (Signed)
  Problem: Education: Goal: Mental status will improve Outcome: Progressing   Problem: Activity: Goal: Interest or engagement in activities will improve Outcome: Progressing   Problem: Coping: Goal: Ability to verbalize frustrations and anger appropriately will improve Outcome: Progressing Goal: Ability to demonstrate self-control will improve Outcome: Progressing

## 2023-12-20 NOTE — Group Note (Signed)
 LCSW Group Therapy Note   Group Date: 12/20/2023 Start Time: 1100 End Time: 1200   Participation:  patient was present and actively participated in the discussion.  He was insightful.  Type of Therapy:  Group Therapy  Topic:  Title: Finding Balance: Using Wise Mind for Thoughtful Decisions  Objective:  To help participants understand and apply the concept of Delsie Mind to make balanced, thoughtful decisions by integrating emotion and logic.  Goals: Learn the differences between Emotional Mind, Reasonable Mind, and [employer]. Recognize personal signs of Emotional and Reasonable Mind. Practice using [employer] in real-life scenarios.  Therapeutic Modalities: Elements of Dialectical Behavior Therapy (DBT):  Mindfulness (noticing thoughts and emotions without judgment), Emotion Regulation (understanding and managing emotional responses), Distress Tolerance (coping with difficult situations without making them worse), Wise Mind (integrating emotion and reason for balanced decision-making) Elements of Cognitive Behavioral Therapy (CBT):  Identifying automatic thoughts, Challenging cognitive distortions, Using logic to reframe unhelpful thinking patterns  Summary:  This class focused on Wise Mind--DBT's concept of balancing Emotional Mind and Reasonable Mind. We identified when we're in each state and practiced using Wise Mind to respond thoughtfully in real-life situations. By combining emotion and logic, participants can improve decision-making, manage challenges, and enhance relationships.   Arliss Hepburn O Brooks Kinnan, LCSWA 12/20/2023  12:16 PM

## 2023-12-20 NOTE — Group Note (Signed)
 Recreation Therapy Group Note   Group Topic:Animal Assisted Therapy   Group Date: 12/20/2023 Start Time: 9050 End Time: 1030 Facilitators: Tabetha Haraway-McCall, LRT,CTRS Location: [address] Dayroom   Animal-Assisted Activity (AAA) Program Checklist/Progress Notes Patient Eligibility Criteria Checklist & Daily Group note for Rec Tx Intervention  AAA/T Program Assumption of Risk Form signed by Patient/ or Parent Legal Guardian Yes  Patient understands his/her participation is voluntary Yes  Behavioral Response:    Education: Charity fundraiser, Appropriate Animal Interaction   Education Outcome: Acknowledges education.    Affect/Mood: N/A   Participation Level: Did not attend    Clinical Observations/Individualized Feedback:      Plan: Continue to engage patient in RT group sessions 2-3x/week.   Selene Peltzer-McCall, LRT,CTRS 12/20/2023 12:09 PM

## 2023-12-20 NOTE — BHH Group Notes (Signed)
 Group Topic/Focus:  Goals Group:   The focus of this group is to help patients establish daily goals to achieve during treatment and discuss how the patient can incorporate goal setting into their daily lives to aide in recovery.       Participation Level:  Active   Participation Quality:  Attentive   Affect:  Appropriate   Cognitive:  Appropriate   Insight: Appropriate   Engagement in Group:  Engaged   Modes of Intervention:  Discussion   Additional Comments:   Patient attended goals group and was attentive the duration of it. Patient's goal was to continue to stabilize his mood through meds, groups and socializing with his friends. Pt has no feelings of wanting to hurt himself or others.

## 2023-12-20 NOTE — BHH Counselor (Signed)
 CSW Provided patient with resources for housing/shelters, as requested. Encouraged patient to call shelters to inquire about bed availability.  Alyus Mofield, LCSWA 12/20/23 3:41PM

## 2023-12-20 NOTE — Progress Notes (Signed)
(  Sleep Hours) -  7.5 (Any PRNs that were needed, meds refused, or side effects to meds)- tylenol , trazodone   (Any disturbances and when (visitation, over night)- none (Concerns raised by the patient)- none (SI/HI/AVH)- denies

## 2023-12-20 NOTE — Group Note (Signed)
 Date:  12/20/2023 Time:  4:35 PM  Group Topic/Focus:  Ask Me 3 to Understand Treatment Plan    Participation Level:  Active  Participation Quality:  Appropriate  Affect:  Appropriate  Cognitive:  Appropriate  Insight: Appropriate  Engagement in Group:  Engaged  Modes of Intervention:  Discussion and Education  Additional Comments:    [name] 12/20/2023, 4:35 PM

## 2023-12-20 NOTE — Plan of Care (Signed)
   Problem: Education: Goal: Emotional status will improve Outcome: Progressing Goal: Mental status will improve Outcome: Progressing Goal: Verbalization of understanding the information provided will improve Outcome: Progressing

## 2023-12-20 NOTE — Progress Notes (Signed)
 [facility] Inpatient Psychiatry Progress Note  Date: 12/20/23 Patient: ROCHELL Davis MRN: [id number]  Assessment and Plan: [name] is a 63 year old male with severe, chronic, treatment-resistant MDD, GAD, and PTSD, two remote suicide attempts and multiple recent psychiatric hospitalizations, and significant psychosocial stressors (homelessness, financial strain, food insecurity). He has a history of substance use (alcohol , cannabis), but no evidence of acute intoxication or withdrawal. He is currently at high risk for suicide given chronic SI, recent plan, prior attempts, and lack of stable housing.    Many of his current symptoms appear directly related to lack of housing and ongoing financial stressors.   # MDD, recurrent, severe, w/o psychotic features # PTSD - sertraline  200 mg daily - bupropion  SR 150 mg daily - Continue Abilify  15 mg daily - Trazodone  50 mg at bedtime - prazosin  1 mg qhs   # HTN # HLD # DM2 - Atorvastatin  40 mg daily - lisinopril  40 mg daily - amlodipine  10 mg daily - Insulin  sliding scale - Glipizide  XR 5 mg daily - metformin  1000 mg BID w/meals  Risk Assessment - High  Discharge Planning Barriers to discharge: Suicidal ideation Estimated length of stay: 7-10 days Predicted Discharge location: Homelessness     Interval History and update: Chart reviewed. No significant events overnight. Patient has been med compliant. He slept better last night and reports that today has been a much better day for him. He reports that socializing with others has been very helpful for him and has lifted his spirits. He reported experiencing passive suicidal ideation today. He continues to worry over his long-term housing instability. He reports ongoing depression and anhedonia but feels slightly more energetic and upbeat today. He is tolerating is medications well.       Physical Exam MSK/Neuro - Normal gait and station  Mental  Status Exam Appearance - Casually dressed, appropriate hygiene and grooming  Attitude - Calm, polite, not guarded Speech - normal volume, prosody, inflection Mood - A little better Affect - Restricted Thought Process - LLGD Thought Content - No delusional TC expressed SI/HI - Ongoing passive SI Perceptions - Denies AVH; not RIS Judgement/Insight - Good  Fund of knowledge - WNL Language - No impairments      Lab Results:  Admission on 12/18/2023  Component Date Value Ref Range Status   Glucose-Capillary 12/18/2023 160 (H)  70 - 99 mg/dL Final   Glucose-Capillary 12/18/2023 159 (H)  70 - 99 mg/dL Final   Glucose-Capillary 12/18/2023 223 (H)  70 - 99 mg/dL Final   Glucose-Capillary 12/18/2023 210 (H)  70 - 99 mg/dL Final   Comment 1 90/78/7974 Notify RN   Final   Glucose-Capillary 12/19/2023 193 (H)  70 - 99 mg/dL Final   Comment 1 90/77/7974 Notify RN   Final   Comment 2 12/19/2023 Document in Chart   Final   Glucose-Capillary 12/19/2023 148 (H)  70 - 99 mg/dL Final   Glucose-Capillary 12/19/2023 114 (H)  70 - 99 mg/dL Final   Glucose-Capillary 12/19/2023 137 (H)  70 - 99 mg/dL Final   Glucose-Capillary 12/20/2023 121 (H)  70 - 99 mg/dL Final   Comment 1 90/76/7974 Notify RN   Final   Glucose-Capillary 12/20/2023 168 (H)  70 - 99 mg/dL Final     Vitals: Blood pressure 134/65, pulse 61, temperature 97.7 F (36.5 C), temperature source Oral, resp. rate 18, height 5' 8 (1.727 m), weight 105 kg, SpO2 98%.    Dustin DELENA Salmon, DO

## 2023-12-20 NOTE — [facility] Group Notes (Signed)
 [facility] Group Notes:  (Nursing/MHT/Case Management/Adjunct)  Date:  12/20/2023  Time:  2000  Type of Therapy:  Wrap up group  Participation Level:  Active  Participation Quality:  Appropriate, Attentive, Sharing, and Supportive  Affect:  Appropriate  Cognitive:  Alert  Insight:  Improving  Engagement in Group:  Engaged  Modes of Intervention:  Clarification, Education, and Support  Summary of Progress/Problems: Positive thinking and positive change were discussed.  [name] 12/20/2023, 9:13 PM

## 2023-12-20 NOTE — Progress Notes (Signed)
   12/20/23 1200  Psych Admission Type (Psych Patients Only)  Admission Status Voluntary  Psychosocial Assessment  Patient Complaints Depression  Eye Contact Fair  Facial Expression Sad  Affect Depressed;Sad;Flat  Speech Logical/coherent  Interaction Assertive  Motor Activity Slow  Appearance/Hygiene Unremarkable  Behavior Characteristics Cooperative;Appropriate to situation  Mood Pleasant  Thought Process  Coherency WDL  Content WDL  Delusions None reported or observed  Perception WDL  Hallucination None reported or observed  Judgment Poor  Confusion Severe  Danger to Self  Current suicidal ideation? Passive  Self-Injurious Behavior No self-injurious ideation or behavior indicators observed or expressed   Agreement Not to Harm Self Yes  Description of Agreement verbal  Danger to Others  Danger to Others None reported or observed

## 2023-12-21 LAB — GLUCOSE, CAPILLARY
Glucose-Capillary: 178 mg/dL — ABNORMAL HIGH (ref 70–99)
Glucose-Capillary: 181 mg/dL — ABNORMAL HIGH (ref 70–99)
Glucose-Capillary: 168 mg/dL — ABNORMAL HIGH (ref 70–99)
Glucose-Capillary: 182 mg/dL — ABNORMAL HIGH (ref 70–99)

## 2023-12-21 NOTE — Plan of Care (Signed)
   Problem: Education: Goal: Emotional status will improve Outcome: Progressing Goal: Mental status will improve Outcome: Progressing Goal: Verbalization of understanding the information provided will improve Outcome: Progressing

## 2023-12-21 NOTE — Progress Notes (Signed)
 [facility] Inpatient Psychiatry Progress Note  Date: 12/21/23 Patient: [name] MRN: [id number]  Assessment and Plan: [name] is a 63 year old male with severe, chronic, treatment-resistant MDD, GAD, and PTSD, two remote suicide attempts and multiple recent psychiatric hospitalizations, and significant psychosocial stressors (homelessness, financial strain, food insecurity). He has a history of substance use (alcohol , cannabis), but no evidence of acute intoxication or withdrawal. He is currently at high risk for suicide given chronic SI, recent plan, prior attempts, and lack of stable housing.    Many of his current symptoms appear directly related to lack of housing and ongoing financial stressors.   # MDD, recurrent, severe, w/o psychotic features # PTSD - sertraline  200 mg daily - bupropion  SR 150 mg daily - Continue Abilify  15 mg daily - Trazodone  50 mg at bedtime - prazosin  1 mg qhs   # HTN # HLD # DM2 - Atorvastatin  40 mg daily - lisinopril  40 mg daily - amlodipine  10 mg daily - Insulin  sliding scale - Glipizide  XR 5 mg daily - metformin  1000 mg BID w/meals  Risk Assessment - High  Discharge Planning Barriers to discharge: Suicidal ideation Estimated length of stay: 7-10 days Predicted Discharge location: Homelessness     Interval History and update: Chart reviewed. He reported sleeping very well last night. His mood is improved. He has been writing some essays about his experiences and symptoms and reports that it has been helpful and cathartic. He has been active in the milieu and participating well in groups. He reports that the addition of the bupropion  seems to be boosting his mood without problematic side effects. He denies any SI today, though he continues to be anxious about his discharge plan.       Physical Exam MSK/Neuro - Normal gait and station  Mental Status Exam Appearance - Casually dressed, appropriate hygiene  and grooming  Attitude - Calm, polite, not guarded Speech - normal volume, prosody, inflection Mood - A little better Affect - Restricted Thought Process - LLGD Thought Content - No delusional TC expressed SI/HI - Ongoing passive SI Perceptions - Denies AVH; not RIS Judgement/Insight - Good  Fund of knowledge - WNL Language - No impairments      Lab Results:  Admission on 12/18/2023  Component Date Value Ref Range Status   Glucose-Capillary 12/18/2023 160 (H)  70 - 99 mg/dL Final   Glucose-Capillary 12/18/2023 159 (H)  70 - 99 mg/dL Final   Glucose-Capillary 12/18/2023 223 (H)  70 - 99 mg/dL Final   Glucose-Capillary 12/18/2023 210 (H)  70 - 99 mg/dL Final   Comment 1 90/78/7974 Notify RN   Final   Glucose-Capillary 12/19/2023 193 (H)  70 - 99 mg/dL Final   Comment 1 90/77/7974 Notify RN   Final   Comment 2 12/19/2023 Document in Chart   Final   Glucose-Capillary 12/19/2023 148 (H)  70 - 99 mg/dL Final   Glucose-Capillary 12/19/2023 114 (H)  70 - 99 mg/dL Final   Glucose-Capillary 12/19/2023 137 (H)  70 - 99 mg/dL Final   Glucose-Capillary 12/20/2023 121 (H)  70 - 99 mg/dL Final   Comment 1 90/76/7974 Notify RN   Final   Glucose-Capillary 12/20/2023 168 (H)  70 - 99 mg/dL Final   Glucose-Capillary 12/20/2023 185 (H)  70 - 99 mg/dL Final   Glucose-Capillary 12/20/2023 180 (H)  70 - 99 mg/dL Final   Comment 1 90/76/7974 Document in Chart   Final   Glucose-Capillary 12/21/2023 178 (H)  70 - 99 mg/dL Final   Comment 1 90/75/7974 Notify RN   Final   Comment 2 12/21/2023 Document in Chart   Final   Glucose-Capillary 12/21/2023 181 (H)  70 - 99 mg/dL Final     Vitals: Blood pressure 132/79, pulse 66, temperature (!) 97.4 F (36.3 C), temperature source Oral, resp. rate 18, height 5' 8 (1.727 m), weight 105 kg, SpO2 96%.    Oliva DELENA Salmon, DO

## 2023-12-21 NOTE — [facility] Group Notes (Signed)
 Psychoeducational Group Note  Date:  12/21/2023 Time:  2137  Group Topic/Focus:  Recovery Goals:   The focus of this group is to identify appropriate goals for recovery and establish a plan to achieve them.  Participation Level: Did Not Attend  Participation Quality:  Not Applicable  Affect:  Not Applicable  Cognitive:  Not Applicable  Insight:  Not Applicable  Engagement in Group: Not Applicable  Additional Comments:  The patient did not attend the evening group.   Maxine Huynh S 12/21/2023, 9:37 PM

## 2023-12-21 NOTE — Progress Notes (Signed)
(  Sleep Hours) -7.25 (Any PRNs that were needed, meds refused, or side effects to meds)- prn trazodone  given @ 2107 (Any disturbances and when (visitation, over night)-none (Concerns raised by the patient)- none (SI/HI/AVH)- passive SI

## 2023-12-21 NOTE — Group Note (Signed)
 Date:  12/21/2023 Time:  9:26 AM  Group Topic/Focus:  Goals Group:   The focus of this group is to help patients establish daily goals to achieve during treatment and discuss how the patient can incorporate goal setting into their daily lives to aide in recovery.    Participation Level:  Active  Participation Quality:  Appropriate  Affect:  Appropriate  Cognitive:  Alert  Insight: Appropriate  Engagement in Group:  Engaged  Modes of Intervention:  Orientation  Additional Comments:  Wants to focus on his stabile with his life. He wants to get his moods in order.  [name] M [name] 12/21/2023, 9:26 AM

## 2023-12-21 NOTE — Group Note (Signed)
 Recreation Therapy Group Note   Group Topic:Team Building  Group Date: 12/21/2023 Start Time: 0930 End Time: 1000 Facilitators: Latesia Norrington-McCall, LRT,CTRS Location: [address] Dayroom   Group Topic: Communication, Team Building, Problem Solving  Goal Area(s) Addresses:  Patient will effectively work with peer towards shared goal.  Patient will identify skills used to make activity successful.  Patient will share challenges and verbalize solution-driven approaches used. Patient will identify how skills used during activity can be used to reach post d/c goals.   Behavioral Response: Engaged  Intervention: STEM Activity   Activity: [employer]. Patients were provided the following materials: 5 drinking straws, 5 rubber bands, 5 paper clips, 2 index cards, 2 drinking cups, and 2 toilet paper rolls. Using the provided materials patients were asked to build a launching mechanism to launch a ping pong ball across the room, approximately 10 feet. Patients were divided into teams of 3-5. Instructions required all materials be incorporated into the device, functionality of items left to the peer group's discretion.  Education: Pharmacist, community, Scientist, physiological, Air cabin crew, Building control surveyor.   Education Outcome: Acknowledges education/In group clarification offered/Needs additional education.    Affect/Mood: Appropriate   Participation Level: Engaged   Participation Quality: Independent   Behavior: Appropriate   Speech/Thought Process: Focused   Insight: Good   Judgement: Good   Modes of Intervention: STEM Activity   Patient Response to Interventions:  Engaged   Education Outcome:  In group clarification offered    Clinical Observations/Individualized Feedback: Pt was engaged and vocal during group session. Pt worked well with peer in [name]. Pt was also attentive to peer as they shared their ideas as well. Pt was focused throughout group.       Plan: Continue to engage patient in RT group sessions 2-3x/week.   [name], LRT,CTRS 12/21/2023 11:52 AM

## 2023-12-21 NOTE — Progress Notes (Signed)
 D:  Patient's self inventory sheet, patient sleeps good, sleep medication helpful.  Good appetite, normal energy level, good concentration.  Rated depression 1, hopeless and anxiety 3.  Denied withdrawals.  Denied SI.   Physical problems, pain, all over, worst pain #4 in past 24 hours.  Pain medicine helpful.  Goal is work to discharge, attend groups, take meds, talk to new friends.  Discharge plan?  No discharge plan at this time.  Concerned about being homeless. A:  Medications administered per MD orders.  Emotional support and encouragement given. R:  Denied SI and HI, contracts for safety.  Denied A/V hallucinations.  Safety maintained with 15 minute checks.

## 2023-12-21 NOTE — Plan of Care (Signed)
 Nurse discussed anxiety, depression and coping skills with patient.

## 2023-12-21 NOTE — Group Note (Signed)
 Date:  12/21/2023 Time:  4:32 PM  Group Topic/Focus:  Dimensions of Wellness: The focus of this group is to introduce the topic of wellness and discuss the role each dimension of wellness plays in total health.    Participation Level:  Active  Participation Quality:  Appropriate  Affect:  Appropriate  Cognitive:  Appropriate  Insight: Appropriate  Engagement in Group:  Engaged  Modes of Intervention:  Discussion and Education    Shanda BIRCH Elwyn Lowden 12/21/2023, 4:32 PM

## 2023-12-22 LAB — GLUCOSE, CAPILLARY
Glucose-Capillary: 125 mg/dL — ABNORMAL HIGH (ref 70–99)
Glucose-Capillary: 159 mg/dL — ABNORMAL HIGH (ref 70–99)
Glucose-Capillary: 105 mg/dL — ABNORMAL HIGH (ref 70–99)
Glucose-Capillary: 123 mg/dL — ABNORMAL HIGH (ref 70–99)

## 2023-12-22 NOTE — Plan of Care (Signed)
   Problem: Education: Goal: Emotional status will improve Outcome: Progressing Goal: Mental status will improve Outcome: Progressing

## 2023-12-22 NOTE — Group Note (Signed)
 LCSW Group Therapy Note   Group Date: 12/22/2023 Start Time: 1100 End Time: 1200   Participation:  patient was present and actively participated in the discussion.  He was insightful.  Type of Therapy:  Group Therapy  Topic:  Healing Flames: Navigating Anger with Compassion  Objective:  Foster self-awareness and promote compassion toward oneself and others when dealing with anger.  Goals:  Help participants understand the underlying emotions and needs fueling anger. Provide coping strategies for healthier emotional expression and anger management.  Summary: This session explored anger as a volcano - an explosion driven by deeper feelings and unmet needs. Participants learned to identify anger triggers and underlying emotions, then practiced coping strategies like deep breathing, physical activity, and journaling. The group discussed healthy ways to manage anger before it escalates, using both personal reflection and shared experiences.  Therapeutic Modalities: Cognitive Behavioral Therapy (CBT): Challenging thoughts that fuel anger. Mindfulness: Increasing awareness of emotions and sensations.   [name], LCSWA 12/22/2023  12:44 PM

## 2023-12-22 NOTE — Group Note (Signed)
 Recreation Therapy Group Note   Group Topic:Other  Group Date: 12/22/2023 Start Time: 1307 End Time: 1346 Facilitators: Pelagia Iacobucci-McCall, LRT,CTRS Location: [address] Dayroom   Activity Description/Intervention: Therapeutic Drumming. Patients with peers and staff were given the opportunity to engage in a leader facilitated [facility] Group Empowerment Drumming Circle with staff from the [employer], in partnership with [employer]. Teaching laboratory technician and trained [employer], Norleen Mon leading with LRT observing and documenting intervention and pt response. This evidenced-based practice targets 7 areas of health and wellbeing in the human experience including: stress-reduction, exercise, self-expression, camaraderie/support, nurturing, spirituality, and music-making (leisure).   Goal Area(s) Addresses:  Patient will engage in pro-social way in music group.  Patient will follow directions of drum leader on the first prompt. Patient will demonstrate no behavioral issues during group.  Patient will identify if a reduction in stress level occurs as a result of participation in therapeutic drum circle.    Education: Leisure exposure, Pharmacologist, Musical expression, Discharge Planning   Affect/Mood: Appropriate   Participation Level: Engaged   Participation Quality: Independent   Behavior: Appropriate   Speech/Thought Process: Focused   Insight: Good   Judgement: Good   Modes of Intervention: Teaching laboratory technician   Patient Response to Interventions:  Engaged   Education Outcome:  In group clarification offered    Clinical Observations/Individualized Feedback: [name] actively engaged in therapeutic drumming exercise and discussions. Pt was appropriate with peers, staff, and musical equipment for duration of programming. Pt identified joyous as their feeling after participation in music-based programming. Pt affect congruent with verbalized  emotion.      Plan: Continue to engage patient in RT group sessions 2-3x/week.   [name], LRT,CTRS 12/22/2023 2:12 PM

## 2023-12-22 NOTE — Progress Notes (Signed)
 Tour of Duty:  Prentice JINNY Angle, RN, 12/22/23, Tour of Duty: 0700-1900  SI/HI/AVH: Denies  Self-Reported   Mood: Positive  Anxiety: Denies Depression: Denies Irritability: Denies  Broset  Violence Prevention Guidelines *See Row Information*: Small Violence Risk interventions implemented   LBM  Last BM Date : 12/21/23   Pain: present, PRN provided (see MAR)  Patient Refusals (including Rx): No  Shift Summary: Patient observed to be calm on unit. Patient able to make needs known. Patient observed to engage appropriately with staff and peers. Patient taking medications as prescribed. This shift, PRN medication requested and administered. No observed or reported side effects to medication. No observed or reported agitation, aggression, or other acute emotional distress. No observed or reported physical abnormalities or concerns.  Last Vitals  Vitals Weight: 105 kg Temp: 97.7 F (36.5 C) Temp Source: Oral Pulse Rate: 66 Resp: 18 BP: 126/74 Patient Position: (not recorded)  Admission Type  Psych Admission Type (Psych Patients Only) Admission Status: Voluntary Date 72 hour document signed : (not recorded) Time 72 hour document signed : (not recorded) Provider Notified (First and Last Name) (see details for LINK to note): (not recorded)   Psychosocial Assessment  Psychosocial Assessment Patient Complaints: None Eye Contact: Fair Facial Expression: Other (Comment) (WDL) Affect: Other (Comment) (WDL) Speech: Logical/coherent Interaction: Assertive Motor Activity: Slow, Shuffling Appearance/Hygiene: Unremarkable Behavior Characteristics: Cooperative Mood: Pleasant   Aggressive Behavior  Targets: (not recorded)   Thought Process  Thought Process Coherency: Within Defined Limits Content: Within Defined Limits Delusions: None reported or observed Perception: Within Defined Limits Hallucination: None reported or observed Judgment: Limited Confusion:  None  Danger to Self/Others  Danger to Self Current suicidal ideation?: Denies Description of Suicide Plan: (not recorded) Self-Injurious Behavior: (not recorded) Agreement Not to Harm Self: (not recorded) Description of Agreement: (not recorded) Danger to Others: (not recorded)

## 2023-12-22 NOTE — Plan of Care (Signed)
   Problem: Education: Goal: Emotional status will improve Outcome: Progressing Goal: Mental status will improve Outcome: Progressing Goal: Verbalization of understanding the information provided will improve Outcome: Progressing

## 2023-12-22 NOTE — Progress Notes (Signed)
 CSW spoke with patient regarding discharging options. Pt reported he's been calling shelters on the list given but stated all shelters don't have beds at the moment. Informed patient of [facility] and resources available there. Pt was amenable to being discharged there if he's not able to find a bed at any other local shelter. Pt reported he'd be contacting [facility] today for potential bed availability.  Bevan Vu, LCSWA 12/22/23 2:00PM

## 2023-12-22 NOTE — Group Note (Signed)
 Date:  12/22/2023 Time:  9:14 AM  Group Topic/Focus:  Goals Group:   The focus of this group is to help patients establish daily goals to achieve during treatment and discuss how the patient can incorporate goal setting into their daily lives to aide in recovery.    Participation Level:  Active  Participation Quality:  Appropriate  Affect:  Appropriate  Cognitive:  Appropriate  Insight: Appropriate  Engagement in Group:  Engaged  Modes of Intervention:  Discussion  Additional Comments:  to get mood stable  Nat Rummer 12/22/2023, 9:14 AM

## 2023-12-22 NOTE — [facility] Group Notes (Signed)
 Adult Psychoeducational Group Note  Date:  12/22/2023 Time:  8:51 PM  Group Topic/Focus:  Wrap-Up Group:   The focus of this group is to help patients review their daily goal of treatment and discuss progress on daily workbooks.  Participation Level:  Active  Participation Quality:  Appropriate  Affect:  Appropriate  Cognitive:  Appropriate  Insight: Appropriate  Engagement in Group:  Engaged  Modes of Intervention:  Discussion and Support  Additional Comments:  Pt told that today was a good day on the unit, the highlight of which was playing [place] with his peers. On the subject of ways to stay well upon discharge, Pt mentioned wanting to resume sessions with his prior therapist. Pt rated his day a 7 out of 10.  Danyetta Gillham Lee 12/22/2023, 8:51 PM

## 2023-12-22 NOTE — Progress Notes (Signed)
(  Sleep Hours) -7.25 (Any PRNs that were needed, meds refused, or side effects to meds)- prn tylenol  for general discomfort and trazodone  @ 2109 (Any disturbances and when (visitation, over night)-none (Concerns raised by the patient)- none (SI/HI/AVH)- denies all

## 2023-12-23 ENCOUNTER — Other Ambulatory Visit: Payer: Self-pay

## 2023-12-23 DIAGNOSIS — F333 Major depressive disorder, recurrent, severe with psychotic symptoms: Principal | ICD-10-CM

## 2023-12-23 DIAGNOSIS — E785 Hyperlipidemia, unspecified: Secondary | ICD-10-CM

## 2023-12-23 DIAGNOSIS — E1169 Type 2 diabetes mellitus with other specified complication: Secondary | ICD-10-CM

## 2023-12-23 DIAGNOSIS — F4310 Post-traumatic stress disorder, unspecified: Secondary | ICD-10-CM

## 2023-12-23 DIAGNOSIS — Z794 Long term (current) use of insulin: Secondary | ICD-10-CM

## 2023-12-23 DIAGNOSIS — E1111 Type 2 diabetes mellitus with ketoacidosis with coma: Secondary | ICD-10-CM

## 2023-12-23 LAB — GLUCOSE, CAPILLARY: Glucose-Capillary: 134 mg/dL — ABNORMAL HIGH (ref 70–99)

## 2023-12-23 MED ORDER — INSULIN LISPRO 100 UNIT/ML IJ SOLN
0.0000 [IU] | Freq: Three times a day (TID) | INTRAMUSCULAR | 0 refills | Status: DC
  Filled 2023-12-23: qty 20, 34d supply, fill #0

## 2023-12-23 MED ORDER — METFORMIN HCL 1000 MG PO TABS
1000.0000 mg | ORAL_TABLET | Freq: Two times a day (BID) | ORAL | 0 refills | Status: DC
  Filled 2023-12-23: qty 60, 30d supply, fill #0

## 2023-12-23 MED ORDER — BUPROPION HCL ER (SR) 150 MG PO TB12
150.0000 mg | ORAL_TABLET | Freq: Every day | ORAL | 0 refills | Status: DC
  Filled 2023-12-23: qty 30, 30d supply, fill #0

## 2023-12-23 MED ORDER — LANSOPRAZOLE 15 MG PO CPDR
15.0000 mg | DELAYED_RELEASE_CAPSULE | Freq: Every day | ORAL | 0 refills | Status: DC
  Filled 2023-12-23: qty 30, 30d supply, fill #0

## 2023-12-23 MED ORDER — TRAZODONE HCL 50 MG PO TABS
50.0000 mg | ORAL_TABLET | Freq: Every evening | ORAL | 0 refills | Status: DC | PRN
  Filled 2023-12-23: qty 30, 30d supply, fill #0

## 2023-12-23 MED ORDER — SERTRALINE HCL 100 MG PO TABS
200.0000 mg | ORAL_TABLET | Freq: Every day | ORAL | 0 refills | Status: DC
  Filled 2023-12-23: qty 60, 30d supply, fill #0

## 2023-12-23 MED ORDER — GLIPIZIDE ER 5 MG PO TB24
5.0000 mg | ORAL_TABLET | Freq: Two times a day (BID) | ORAL | 0 refills | Status: DC
  Filled 2023-12-23: qty 60, 30d supply, fill #0

## 2023-12-23 MED ORDER — LISINOPRIL 40 MG PO TABS
40.0000 mg | ORAL_TABLET | Freq: Every day | ORAL | 0 refills | Status: DC
  Filled 2023-12-23: qty 30, 30d supply, fill #0

## 2023-12-23 NOTE — Group Note (Signed)
 Date:  12/23/2023 Time:  9:00 AM  Group Topic/Focus:  Goals Group:   The focus of this group is to help patients establish daily goals to achieve during treatment and discuss how the patient can incorporate goal setting into their daily lives to aide in recovery. Patients were asked two questions: What is something you can do today to take care of your mental health? What is 1 thing you're proud of doing lately?    Participation Level:  Active  Participation Quality:  Appropriate and Sharing  Affect:  Appropriate  Cognitive:  Alert and Appropriate  Insight: Appropriate and Improving  Engagement in Group:  Engaged and Improving  Modes of Intervention:  Discussion, Socialization, and Support  Additional Comments:  Patient attended and participated in goals group. [name] shared he plans to attend all groups and work with his care team to prepare for discharge. He is proud that he has gotten a good night's sleep.  Kristi HERO Dimitriy Carreras 12/23/2023, 9:00 AM

## 2023-12-23 NOTE — Group Note (Signed)
 Recreation Therapy Group Note   Group Topic:Leisure Education  Group Date: 12/23/2023 Start Time: 0935 End Time: 1003 Facilitators: Louay Myrie-McCall, LRT,CTRS Location: [address] Dayroom   Group Topic: Leisure Education  Goal Area(s) Addresses:  Patient will identify positive leisure activities.  Patient will identify one positive benefit of participation in leisure activities.   Behavioral Response: Engaged  Intervention: Leisure Group Game  Activity: Patient, MHT, and LRT participated in playing a trivia game of Guess the [place]. Participants took turns flicking the spinner. Whatever category (R&B, Hip Hop, Pop, Indie, Dance and Rock) the spinner landed on, the person would be read the lyrics and the person would have guess the missing lyric. The person with the most cards at the end of game won.  Education:  Leisure Exposure, Pharmacist, community, Discharge Planning  Education Outcome: Acknowledges education/In group clarification offered/Needs additional education   Affect/Mood: Appropriate   Participation Level: Engaged   Participation Quality: Independent   Behavior: Appropriate   Speech/Thought Process: Focused   Insight: Good   Judgement: Good   Modes of Intervention: Competitive Play   Patient Response to Interventions:  Engaged   Education Outcome:  In group clarification offered    Clinical Observations/Individualized Feedback: Pt was engaged and focused. Pt was also thoughtful in the answers he was giving during group.     Plan: Continue to engage patient in RT group sessions 2-3x/week.   Hind Chesler-McCall, LRT,CTRS 12/23/2023 12:19 PM

## 2023-12-23 NOTE — Progress Notes (Signed)
(  Sleep Hours) -5.75 (Any PRNs that were needed, meds refused, or side effects to meds)- prn tylenol  and trazodone  @ 2107: tylenol  @ 0542 for all over body aches (Any disturbances and when (visitation, over night)-none (Concerns raised by the patient)- none (SI/HI/AVH)- denies all

## 2023-12-23 NOTE — Progress Notes (Signed)
 Discharge Note:  pt is alert and oriented x 4. Denies si, hi or avh. Pt given AVS along with discharge instructions.  Verbalized understanding.   Pt also given all belongings back including home medications from locker.  He was escorted to lobby and left via cab voucher.   No distress noted.

## 2023-12-23 NOTE — Progress Notes (Signed)
  [facility] Adult Case Management Discharge Plan :  Will you be returning to the same living situation after discharge:  No. At discharge, do you have transportation home?: Yes,  pt will be transported home via taxi voucher.  Do you have the ability to pay for your medications: Yes,  pt is insured - [employer] of information consent forms completed and in the chart;  Patient's signature needed at discharge.  Patient to Follow up at:  Follow-up Information     [facility] Follow up on 01/11/2024.   Specialty: Behavioral Health Why: You have an appointment for medication managament services on 01/11/24 at 2:30 pm.  You also have an appointment for therapy services on 01/12/24 at 11:00 am. Contact information: [address] [place]  [phone]                Next level of care provider has access to [facility] Link:no  Safety Planning and Suicide Prevention discussed: Yes,  completed with patient as he declined to provide consent to contact family/friends.      Has patient been referred to the Quitline?: Patient does not use tobacco/nicotine products  Patient has been referred for addiction treatment: No known substance use disorder.  Jorie Zee M Aureliano Oshields, LCSWA 12/23/2023, 9:46 AM

## 2023-12-23 NOTE — Transportation (Signed)
 12/23/2023  [name] DOB: 09-Oct-1960 MRN: [id number]   RIDER WAIVER AND RELEASE OF LIABILITY  For the purposes of helping with transportation needs, [facility] partners with outside transportation providers (taxi companies, [place], Catering manager.) to give [facility] patients or other approved people the choice of on-demand rides Public librarian) to our buildings for non-emergency visits.  By using [employer], I, the person signing this document, on behalf of myself and/or any legal minors (in my care using the [employer]), agree:  Science writer given to me are supplied by independent, outside transportation providers who do not work for, or have any affiliation with, [employer]. [employer] is not a transportation company. [facility] has no control over the quality or safety of the rides I get using [employer]. [facility] has no control over whether any outside ride will happen on time or not. [facility] gives no guarantee on the reliability, quality, safety, or availability on any rides, or that no mistakes will happen. I know and accept that traveling by vehicle (car, truck, SVU, fleeta, bus, taxi, etc.) has risks of serious injuries such as disability, being paralyzed, and death. I know and agree the risk of using [employer] is mine alone, and not [employer]. [employer] are provided as is and as are available. The transportation providers are in charge for all inspections and care of the vehicles used to provide these rides. I agree not to take legal action against [facility], its agents, employees, officers, directors, representatives, insurers, attorneys, assigns, successors, subsidiaries, and affiliates at any time for any reasons related directly or indirectly to using [employer]. I also agree not to take legal action against [facility] or its affiliates for any injury, death, or damage to property caused by or related to using  [employer]. I have read this Waiver and Release of Liability, and I understand the terms used in it and their legal meaning. This Waiver is freely and voluntarily given with the understanding that my right (or any legal minors) to legal action against [employer] relating to [employer] is knowingly given up to use these services.   I attest that I read the Ride Waiver and Release of Liability to [name], gave Mr. Doolittle the opportunity to ask questions and answered the questions asked (if any). I affirm that [name] then provided consent for assistance with transportation.        Abagael Kramm, LCSWA 12/23/23 10:18am

## 2023-12-23 NOTE — BHH Suicide Risk Assessment (Signed)
 [facility] Discharge Suicide Risk Assessment   Principal Problem: Major depressive disorder, recurrent episode, severe, with psychotic behavior (HCC) Discharge Diagnoses: Principal Problem:   Major depressive disorder, recurrent episode, severe, with psychotic behavior (HCC) Active Problems:   Type 2 diabetes mellitus (HCC)   Hyperlipidemia associated with type 2 diabetes mellitus (HCC)   PTSD (post-traumatic stress disorder)   Demographic Factors:  Male, Caucasian, Low socioeconomic status, Living alone, and Unemployed  Loss Factors: Financial problems/change in socioeconomic status  Historical Factors: Prior suicide attempts  Risk Reduction Factors:   Positive social support and Positive coping skills or problem solving skills  Continued Clinical Symptoms:  Depression, PTSD  Cognitive Features That Contribute To Risk:  None    Suicide Risk:  Mild:  Suicidal ideation of limited frequency, intensity, duration, and specificity.  There are no identifiable plans, no associated intent, mild dysphoria and related symptoms, good self-control (both objective and subjective assessment), few other risk factors, and identifiable protective factors, including available and accessible social support.   Follow-up Information     [facility] Follow up on 01/11/2024.   Specialty: Behavioral Health Why: You have an appointment for medication managament services on 01/11/24 at 2:30 pm.  You also have an appointment for therapy services on 01/12/24 at 11:00 am. Contact information: [address] [place]  [address] [phone]                 Dustin DELENA Salmon, DO 12/23/2023, 9:51 AM
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
## 2023-12-26 ENCOUNTER — Other Ambulatory Visit: Payer: Self-pay

## 2023-12-26 ENCOUNTER — Encounter: Admitting: Family Medicine

## 2023-12-27 ENCOUNTER — Ambulatory Visit (INDEPENDENT_AMBULATORY_CARE_PROVIDER_SITE_OTHER)
Admission: EM | Admit: 2023-12-27 | Discharge: 2023-12-28 | Disposition: A | Discharge status: Other Acute Inpatient Hospital

## 2023-12-27 DIAGNOSIS — E785 Hyperlipidemia, unspecified: Secondary | ICD-10-CM | POA: Insufficient documentation

## 2023-12-27 DIAGNOSIS — Z794 Long term (current) use of insulin: Secondary | ICD-10-CM | POA: Insufficient documentation

## 2023-12-27 DIAGNOSIS — F332 Major depressive disorder, recurrent severe without psychotic features: Secondary | ICD-10-CM | POA: Insufficient documentation

## 2023-12-27 DIAGNOSIS — F329 Major depressive disorder, single episode, unspecified: Secondary | ICD-10-CM

## 2023-12-27 DIAGNOSIS — R45851 Suicidal ideations: Secondary | ICD-10-CM

## 2023-12-27 DIAGNOSIS — I10 Essential (primary) hypertension: Secondary | ICD-10-CM | POA: Insufficient documentation

## 2023-12-27 DIAGNOSIS — Z7984 Long term (current) use of oral hypoglycemic drugs: Secondary | ICD-10-CM | POA: Insufficient documentation

## 2023-12-27 DIAGNOSIS — Z79899 Other long term (current) drug therapy: Secondary | ICD-10-CM | POA: Insufficient documentation

## 2023-12-27 DIAGNOSIS — Z5902 Unsheltered homelessness: Secondary | ICD-10-CM | POA: Insufficient documentation

## 2023-12-27 DIAGNOSIS — R001 Bradycardia, unspecified: Secondary | ICD-10-CM | POA: Insufficient documentation

## 2023-12-27 DIAGNOSIS — E1159 Type 2 diabetes mellitus with other circulatory complications: Secondary | ICD-10-CM

## 2023-12-27 DIAGNOSIS — Z9151 Personal history of suicidal behavior: Secondary | ICD-10-CM | POA: Insufficient documentation

## 2023-12-27 DIAGNOSIS — Z818 Family history of other mental and behavioral disorders: Secondary | ICD-10-CM | POA: Insufficient documentation

## 2023-12-27 DIAGNOSIS — E119 Type 2 diabetes mellitus without complications: Secondary | ICD-10-CM | POA: Diagnosis not present

## 2023-12-27 LAB — CBC WITH DIFFERENTIAL/PLATELET
Abs Immature Granulocytes: 0.02 K/uL (ref 0.00–0.07)
Basophils Absolute: 0 K/uL (ref 0.0–0.1)
Basophils Relative: 0 %
Eosinophils Absolute: 0.2 K/uL (ref 0.0–0.5)
Eosinophils Relative: 3 %
HCT: 43.3 % (ref 39.0–52.0)
Hemoglobin: 14.4 g/dL (ref 13.0–17.0)
Immature Granulocytes: 0 %
Lymphocytes Relative: 21 %
Lymphs Abs: 1.7 K/uL (ref 0.7–4.0)
MCH: 27.9 pg (ref 26.0–34.0)
MCHC: 33.3 g/dL (ref 30.0–36.0)
MCV: 83.9 fL (ref 80.0–100.0)
Monocytes Absolute: 0.5 K/uL (ref 0.1–1.0)
Monocytes Relative: 6 %
Neutro Abs: 5.7 K/uL (ref 1.7–7.7)
Neutrophils Relative %: 70 %
Platelets: 300 K/uL (ref 150–400)
RBC: 5.16 MIL/uL (ref 4.22–5.81)
RDW: 13.3 % (ref 11.5–15.5)
WBC: 8.1 K/uL (ref 4.0–10.5)
nRBC: 0 % (ref 0.0–0.2)

## 2023-12-27 LAB — COMPREHENSIVE METABOLIC PANEL WITH GFR
ALT: 24 U/L (ref 0–44)
AST: 20 U/L (ref 15–41)
Albumin: 3.9 g/dL (ref 3.5–5.0)
Alkaline Phosphatase: 59 U/L (ref 38–126)
Anion gap: 11 (ref 5–15)
BUN: 13 mg/dL (ref 8–23)
CO2: 24 mmol/L (ref 22–32)
Calcium: 9.3 mg/dL (ref 8.9–10.3)
Chloride: 102 mmol/L (ref 98–111)
Creatinine, Ser: 1 mg/dL (ref 0.61–1.24)
GFR, Estimated: >60 mL/min (ref >60)
Glucose, Bld: 143 mg/dL — ABNORMAL HIGH (ref 70–99)
Potassium: 4.2 mmol/L (ref 3.5–5.1)
Sodium: 137 mmol/L (ref 135–145)
Total Bilirubin: 0.7 mg/dL (ref 0.0–1.2)
Total Protein: 7.1 g/dL (ref 6.5–8.1)

## 2023-12-27 LAB — ETHANOL: Alcohol, Ethyl (B): <15 mg/dL (ref <15)

## 2023-12-27 LAB — POCT URINE DRUG SCREEN - MANUAL ENTRY (I-SCREEN)
POC Amphetamine UR: NOT DETECTED
POC Buprenorphine (BUP): NOT DETECTED
POC Cocaine UR: NOT DETECTED
POC Marijuana UR: POSITIVE — AB
POC Methadone UR: NOT DETECTED
POC Methamphetamine UR: NOT DETECTED
POC Morphine: NOT DETECTED
POC Oxazepam (BZO): NOT DETECTED
POC Oxycodone UR: NOT DETECTED
POC Secobarbital (BAR): NOT DETECTED

## 2023-12-27 LAB — GLUCOSE, CAPILLARY
Glucose-Capillary: 150 mg/dL — ABNORMAL HIGH (ref 70–99)
Glucose-Capillary: 187 mg/dL — ABNORMAL HIGH (ref 70–99)

## 2023-12-27 MED ORDER — HYDROXYZINE HCL 25 MG PO TABS: 25.0000 mg | ORAL_TABLET | Freq: Three times a day (TID) | ORAL | Status: DC | PRN

## 2023-12-27 MED ORDER — OLANZAPINE 10 MG IM SOLR: 5.0000 mg | Freq: Three times a day (TID) | INTRAMUSCULAR | Status: DC | PRN

## 2023-12-27 MED ORDER — INSULIN ASPART 100 UNIT/ML IJ SOLN
0.0000 [IU] | Freq: Three times a day (TID) | INTRAMUSCULAR | Status: DC
  Administered 2023-12-28 (×2): 2 [IU] via SUBCUTANEOUS

## 2023-12-27 MED ORDER — ACETAMINOPHEN 325 MG PO TABS
650.0000 mg | ORAL_TABLET | Freq: Four times a day (QID) | ORAL | Status: DC | PRN
  Administered 2023-12-28: 650 mg via ORAL
  Filled 2023-12-27: qty 2

## 2023-12-27 MED ORDER — ATORVASTATIN CALCIUM 40 MG PO TABS
40.0000 mg | ORAL_TABLET | Freq: Every day | ORAL | Status: DC
  Administered 2023-12-27 – 2023-12-28 (×2): 40 mg via ORAL
  Filled 2023-12-27 (×2): qty 1

## 2023-12-27 MED ORDER — GLIMEPIRIDE 2 MG PO TABS
5.0000 mg | ORAL_TABLET | Freq: Every day | ORAL | Status: DC
  Administered 2023-12-28: 5 mg via ORAL
  Filled 2023-12-27: qty 3

## 2023-12-27 MED ORDER — OLANZAPINE 5 MG PO TBDP: 5.0000 mg | ORAL_TABLET | Freq: Three times a day (TID) | ORAL | Status: DC | PRN

## 2023-12-27 MED ORDER — GLIMEPIRIDE 2 MG PO TABS: 5.0000 mg | ORAL_TABLET | Freq: Every day | ORAL | Status: DC

## 2023-12-27 MED ORDER — MAGNESIUM HYDROXIDE 400 MG/5ML PO SUSP: 30.0000 mL | Freq: Every day | ORAL | Status: DC | PRN

## 2023-12-27 MED ORDER — ALUM & MAG HYDROXIDE-SIMETH 200-200-20 MG/5ML PO SUSP: 30.0000 mL | ORAL | Status: DC | PRN

## 2023-12-27 MED ORDER — METFORMIN HCL 500 MG PO TABS
1000.0000 mg | ORAL_TABLET | Freq: Two times a day (BID) | ORAL | Status: DC
  Administered 2023-12-27 – 2023-12-28 (×2): 1000 mg via ORAL
  Filled 2023-12-27 (×2): qty 2

## 2023-12-27 MED ORDER — SERTRALINE HCL 100 MG PO TABS
100.0000 mg | ORAL_TABLET | Freq: Every day | ORAL | Status: DC
  Administered 2023-12-28: 100 mg via ORAL
  Filled 2023-12-27: qty 1

## 2023-12-27 MED ORDER — ARIPIPRAZOLE 15 MG PO TABS
15.0000 mg | ORAL_TABLET | Freq: Every day | ORAL | Status: DC
  Administered 2023-12-27: 15 mg via ORAL
  Filled 2023-12-27: qty 1

## 2023-12-27 MED ORDER — BUPROPION HCL ER (SR) 150 MG PO TB12
150.0000 mg | ORAL_TABLET | Freq: Two times a day (BID) | ORAL | Status: DC
  Administered 2023-12-27 – 2023-12-28 (×2): 150 mg via ORAL
  Filled 2023-12-27 (×2): qty 1

## 2023-12-27 MED ORDER — AMLODIPINE BESYLATE 10 MG PO TABS
10.0000 mg | ORAL_TABLET | Freq: Once | ORAL | Status: AC
  Administered 2023-12-28: 10 mg via ORAL
  Filled 2023-12-27: qty 1

## 2023-12-27 MED ORDER — PANTOPRAZOLE SODIUM 20 MG PO TBEC
20.0000 mg | DELAYED_RELEASE_TABLET | Freq: Every day | ORAL | Status: DC
  Administered 2023-12-27 – 2023-12-28 (×2): 20 mg via ORAL
  Filled 2023-12-27 (×2): qty 1
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
## 2023-12-27 NOTE — ED Provider Notes (Signed)
 [facility] Continuous Assessment Admission H&P  Date: 12/27/23 Patient Name: [name] MRN: [id number] Chief Complaint: I just can't stop these thoughts   Diagnoses:  Final diagnoses:  Severe episode of recurrent major depressive disorder, without psychotic features (HCC)  Treatment-resistant depression  Suicidal ideation   HPI: [name] 63 year old male with severe, recurrent depression, t presented to [facility] as a walk in , long enforcement, voluntarily after he called the behavioral health response team indicating that he was actively suicidal with a plan to overdose on medication.    [name], 63 y.o., male patient seen face to face by this provider, consulted with Dr. Lawrnce ; and chart reviewed on 12/28/23.    On evaluation DELANDO SATTER reports, continued depression and suicidal thoughts. Patient continues to live in his vehicle however, has financial means thought working and receiving monthly check . Patient has been given the option to move in with a family friend and is now open to this options and he feels mentally he will be more stable if living with someone and having someone keep am eye on him. Patient recently has had multiple psychiatric admissions with minimum improvement with multiple medication treatments.  Patient is closely followed outpatient initially by [facility] but is now established with [facility] outpatient behavioral health clinic and sees Dr.Kapoor has an upcoming therapy appointment with outpatient clinic 01/11/2024.  Despite inpatient and outpatient treatment patient has continued to display symptoms of resistant depression and has exhibited suicidal thoughts with behavior suggesting intent.  Patient endorses that he is unable to contract for safety if discharged from this facility today.  Patient denies any recent relapse of alcohol  or endorses that he continues to use [facility] on a daily basis.  Patient has not recently checked his blood sugar or  taking any of his chronic medications within last 24 hours.  Patient was only discharged from [facility] behavioral health inpatient hospital 3 days ago.  Patient been recommended for inpatient psychiatric treatment due to recurrent suicidal ideations with a plan to overdose on medication with pills being found in patient's possession when he was picked up by law enforcement.  Patient is high risk for suicidal completion given he is a white male, older adult, limited family and social support, history of current resistant depression, prior history of suicide attempt.  Patient should be IVC but he attempts to leave given high risk of safety.      Total Time spent with patient: 45 minutes  Musculoskeletal  Strength & Muscle Tone: within normal limits Gait & Station: normal Patient leans: N/A  Psychiatric Specialty Exam  Presentation General Appearance:  Casual  Eye Contact: Good  Speech: Clear and Coherent  Speech Volume: Normal  Handedness: Right   Mood and Affect  Mood: Depressed  Affect: Congruent   Thought Process  Thought Processes: Coherent  Descriptions of Associations:Intact  Orientation:Full (Time, Place and Person)  Thought Content:WDL  Diagnosis of Schizophrenia or Schizoaffective disorder in past: No   Hallucinations:No data recorded Ideas of Reference:None  Suicidal Thoughts:No data recorded Homicidal Thoughts:No data recorded  Sensorium  Memory: Immediate Good; Remote Good; Recent Good  Judgment: Fair  Insight: Fair   Art therapist  Concentration: Fair  Attention Span: Fair  Recall: Fair  Fund of Knowledge: Good  Language: Good   Psychomotor Activity  Psychomotor Activity:No data recorded  Assets  Assets: Communication Skills; Desire for Improvement; Physical Health   Sleep Sleep: Poor   Physical Exam  Constitutional:      General: He is not in acute distress.    Appearance: He is obese.  HENT:     Head:  Normocephalic and atraumatic.     Nose: Nose normal.  Eyes:     Extraocular Movements: Extraocular movements intact.     Conjunctiva/sclera: Conjunctivae normal.     Pupils: Pupils are equal, round, and reactive to light.  Cardiovascular:     Rate and Rhythm: Normal rate and regular rhythm.  Pulmonary:     Effort: Pulmonary effort is normal.     Breath sounds: Normal breath sounds.  Musculoskeletal:     Cervical back: Normal range of motion and neck supple.  Skin:    General: Skin is warm and dry.  Neurological:     General: No focal deficit present.     Mental Status: He is alert and oriented to person, place, and time.     Review of Systems  Psychiatric/Behavioral:  Positive for depression and suicidal ideas. Negative for substance abuse (THC).     Blood pressure 105/71, pulse 72, temperature 97.6 F (36.4 C), temperature source Oral, resp. rate 16, SpO2 95%. There is no height or weight on file to calculate BMI.  Past Psychiatric History: Alcohol  use disorder  Is the patient at risk to self? Yes  Has the patient been a risk to self in the past 6 months? Yes .    Has the patient been a risk to self within the distant past? Yes   Is the patient a risk to others? No   Has the patient been a risk to others in the past 6 months? No   Has the patient been a risk to others within the distant past? No   Past Medical History: Insulin -dependent diabetes, hypertension, hyperlipidemia  Family History:  Family History  Problem Relation Age of Onset   Arthritis Mother    Stroke Father 15       heavy smoker 4-5 packs a day unfiltered   Schizophrenia Father        homeless and did not get medical care in time   Alcoholism Father    Colon cancer Neg Hx    Esophageal cancer Neg Hx    Rectal cancer Neg Hx    Stomach cancer Neg Hx    Colon polyps Neg Hx      Social History: Currently lives in his vehicle, works for [employer] and [employer] eats, receives monthly disability support,  none married, no children, family discord with immediate family  Last Labs:  Admission on 12/27/2023  Component Date Value Ref Range Status   WBC 12/27/2023 8.1  4.0 - 10.5 K/uL Final   RBC 12/27/2023 5.16  4.22 - 5.81 MIL/uL Final   Hemoglobin 12/27/2023 14.4  13.0 - 17.0 g/dL Final   HCT 90/69/7974 43.3  39.0 - 52.0 % Final   MCV 12/27/2023 83.9  80.0 - 100.0 fL Final   MCH 12/27/2023 27.9  26.0 - 34.0 pg Final   MCHC 12/27/2023 33.3  30.0 - 36.0 g/dL Final   RDW 90/69/7974 13.3  11.5 - 15.5 % Final   Platelets 12/27/2023 300  150 - 400 K/uL Final   nRBC 12/27/2023 0.0  0.0 - 0.2 % Final   Neutrophils Relative % 12/27/2023 70  % Final   Neutro Abs 12/27/2023 5.7  1.7 - 7.7 K/uL Final   Lymphocytes Relative 12/27/2023 21  % Final   Lymphs Abs 12/27/2023 1.7  0.7 - 4.0  K/uL Final   Monocytes Relative 12/27/2023 6  % Final   Monocytes Absolute 12/27/2023 0.5  0.1 - 1.0 K/uL Final   Eosinophils Relative 12/27/2023 3  % Final   Eosinophils Absolute 12/27/2023 0.2  0.0 - 0.5 K/uL Final   Basophils Relative 12/27/2023 0  % Final   Basophils Absolute 12/27/2023 0.0  0.0 - 0.1 K/uL Final   Immature Granulocytes 12/27/2023 0  % Final   Abs Immature Granulocytes 12/27/2023 0.02  0.00 - 0.07 K/uL Final   Performed at [facility], [address], [place], [place] [address]   POC Amphetamine UR 12/27/2023 None Detected  NONE DETECTED (Cut Off Level 1000 ng/mL) Final   POC Secobarbital (BAR) 12/27/2023 None Detected  NONE DETECTED (Cut Off Level 300 ng/mL) Final   POC Buprenorphine (BUP) 12/27/2023 None Detected  NONE DETECTED (Cut Off Level 10 ng/mL) Final   POC Oxazepam (BZO) 12/27/2023 None Detected  NONE DETECTED (Cut Off Level 300 ng/mL) Final   POC Cocaine UR 12/27/2023 None Detected  NONE DETECTED (Cut Off Level 300 ng/mL) Final   POC Methamphetamine UR 12/27/2023 None Detected  NONE DETECTED (Cut Off Level 1000 ng/mL) Final   POC Morphine 12/27/2023 None Detected  NONE DETECTED  (Cut Off Level 300 ng/mL) Final   POC Methadone UR 12/27/2023 None Detected  NONE DETECTED (Cut Off Level 300 ng/mL) Final   POC Oxycodone  UR 12/27/2023 None Detected  NONE DETECTED (Cut Off Level 100 ng/mL) Final   POC Marijuana UR 12/27/2023 Positive (A)  NONE DETECTED (Cut Off Level 50 ng/mL) Final   Glucose-Capillary 12/27/2023 150 (H)  70 - 99 mg/dL Final   Glucose reference range applies only to samples taken after fasting for at least 8 hours.   Glucose-Capillary 12/27/2023 187 (H)  70 - 99 mg/dL Final   Glucose reference range applies only to samples taken after fasting for at least 8 hours.  Admission on 12/18/2023, Discharged on 12/23/2023  Component Date Value Ref Range Status   Glucose-Capillary 12/18/2023 160 (H)  70 - 99 mg/dL Final   Glucose reference range applies only to samples taken after fasting for at least 8 hours.   Glucose-Capillary 12/18/2023 159 (H)  70 - 99 mg/dL Final   Glucose reference range applies only to samples taken after fasting for at least 8 hours.   Glucose-Capillary 12/18/2023 223 (H)  70 - 99 mg/dL Final   Glucose reference range applies only to samples taken after fasting for at least 8 hours.   Glucose-Capillary 12/18/2023 210 (H)  70 - 99 mg/dL Final   Glucose reference range applies only to samples taken after fasting for at least 8 hours.   Comment 1 12/18/2023 Notify RN   Final   Glucose-Capillary 12/19/2023 193 (H)  70 - 99 mg/dL Final   Glucose reference range applies only to samples taken after fasting for at least 8 hours.   Comment 1 12/19/2023 Notify RN   Final   Comment 2 12/19/2023 Document in Chart   Final   Glucose-Capillary 12/19/2023 148 (H)  70 - 99 mg/dL Final   Glucose reference range applies only to samples taken after fasting for at least 8 hours.   Glucose-Capillary 12/19/2023 114 (H)  70 - 99 mg/dL Final   Glucose reference range applies only to samples taken after fasting for at least 8 hours.   Glucose-Capillary 12/19/2023  137 (H)  70 - 99 mg/dL Final   Glucose reference range applies only to samples  taken after fasting for at least 8 hours.   Glucose-Capillary 12/20/2023 121 (H)  70 - 99 mg/dL Final   Glucose reference range applies only to samples taken after fasting for at least 8 hours.   Comment 1 12/20/2023 Notify RN   Final   Glucose-Capillary 12/20/2023 168 (H)  70 - 99 mg/dL Final   Glucose reference range applies only to samples taken after fasting for at least 8 hours.   Glucose-Capillary 12/20/2023 185 (H)  70 - 99 mg/dL Final   Glucose reference range applies only to samples taken after fasting for at least 8 hours.   Glucose-Capillary 12/20/2023 180 (H)  70 - 99 mg/dL Final   Glucose reference range applies only to samples taken after fasting for at least 8 hours.   Comment 1 12/20/2023 Document in Chart   Final   Glucose-Capillary 12/21/2023 178 (H)  70 - 99 mg/dL Final   Glucose reference range applies only to samples taken after fasting for at least 8 hours.   Comment 1 12/21/2023 Notify RN   Final   Comment 2 12/21/2023 Document in Chart   Final   Glucose-Capillary 12/21/2023 181 (H)  70 - 99 mg/dL Final   Glucose reference range applies only to samples taken after fasting for at least 8 hours.   Glucose-Capillary 12/21/2023 168 (H)  70 - 99 mg/dL Final   Glucose reference range applies only to samples taken after fasting for at least 8 hours.   Glucose-Capillary 12/21/2023 182 (H)  70 - 99 mg/dL Final   Glucose reference range applies only to samples taken after fasting for at least 8 hours.   Comment 1 12/21/2023 Notify RN   Final   Comment 2 12/21/2023 Document in Chart   Final   Glucose-Capillary 12/22/2023 125 (H)  70 - 99 mg/dL Final   Glucose reference range applies only to samples taken after fasting for at least 8 hours.   Comment 1 12/22/2023 Notify RN   Final   Comment 2 12/22/2023 Document in Chart   Final   Glucose-Capillary 12/22/2023 159 (H)  70 - 99 mg/dL Final   Glucose  reference range applies only to samples taken after fasting for at least 8 hours.   Glucose-Capillary 12/22/2023 105 (H)  70 - 99 mg/dL Final   Glucose reference range applies only to samples taken after fasting for at least 8 hours.   Glucose-Capillary 12/22/2023 123 (H)  70 - 99 mg/dL Final   Glucose reference range applies only to samples taken after fasting for at least 8 hours.   Glucose-Capillary 12/23/2023 134 (H)  70 - 99 mg/dL Final   Glucose reference range applies only to samples taken after fasting for at least 8 hours.   Comment 1 12/23/2023 Notify RN   Final  Admission on 12/17/2023, Discharged on 12/18/2023  Component Date Value Ref Range Status   Sodium 12/17/2023 139  135 - 145 mmol/L Final   Potassium 12/17/2023 4.2  3.5 - 5.1 mmol/L Final   Chloride 12/17/2023 101  98 - 111 mmol/L Final   CO2 12/17/2023 23  22 - 32 mmol/L Final   Glucose, Bld 12/17/2023 302 (H)  70 - 99 mg/dL Final   Glucose reference range applies only to samples taken after fasting for at least 8 hours.   BUN 12/17/2023 16  8 - 23 mg/dL Final   Creatinine, Ser 12/17/2023 1.00  0.61 - 1.24 mg/dL Final   Calcium  12/17/2023 9.1  8.9 - 10.3  mg/dL Final   Total Protein 90/79/7974 6.5  6.5 - 8.1 g/dL Final   Albumin 90/79/7974 4.0  3.5 - 5.0 g/dL Final   AST 90/79/7974 19  15 - 41 U/L Final   ALT 12/17/2023 20  0 - 44 U/L Final   Alkaline Phosphatase 12/17/2023 61  38 - 126 U/L Final   Total Bilirubin 12/17/2023 0.4  0.0 - 1.2 mg/dL Final   GFR, Estimated 12/17/2023 >60  >60 mL/min Final   Comment: (NOTE) Calculated using the CKD-EPI Creatinine Equation (2021)    Anion gap 12/17/2023 14  5 - 15 Final   Performed at [facility], [address] [place], [place] [address]   Alcohol , Ethyl (B) 12/17/2023 <15  <15 mg/dL Final   Comment: (NOTE) For medical purposes only. Performed at [facility], [address] [place], [place] [address]    WBC 12/17/2023 6.2   4.0 - 10.5 K/uL Final   RBC 12/17/2023 4.67  4.22 - 5.81 MIL/uL Final   Hemoglobin 12/17/2023 12.6 (L)  13.0 - 17.0 g/dL Final   HCT 90/79/7974 39.6  39.0 - 52.0 % Final   MCV 12/17/2023 84.8  80.0 - 100.0 fL Final   MCH 12/17/2023 27.0  26.0 - 34.0 pg Final   MCHC 12/17/2023 31.8  30.0 - 36.0 g/dL Final   RDW 90/79/7974 13.2  11.5 - 15.5 % Final   Platelets 12/17/2023 218  150 - 400 K/uL Final   nRBC 12/17/2023 0.0  0.0 - 0.2 % Final   Performed at [facility], [address], [place], [place] [address]   Opiates 12/17/2023 NEGATIVE  NEGATIVE Final   Cocaine 12/17/2023 NEGATIVE  NEGATIVE Final   Benzodiazepines 12/17/2023 NEGATIVE  NEGATIVE Final   Amphetamines 12/17/2023 NEGATIVE  NEGATIVE Final   Tetrahydrocannabinol 12/17/2023 POSITIVE (A)  NEGATIVE Final   Barbiturates 12/17/2023 NEGATIVE  NEGATIVE Final   Methadone Scn, Ur 12/17/2023 NEGATIVE  NEGATIVE Final   Fentanyl  12/17/2023 NEGATIVE  NEGATIVE Final   Comment: (NOTE) Drug screen is for Medical Purposes only. Positive results are preliminary only. If confirmation is needed, notify lab within 5 days.  Drug Class                 Cutoff (ng/mL) Amphetamine and metabolites 1000 Barbiturate and metabolites 200 Benzodiazepine              200 Opiates and metabolites     300 Cocaine and metabolites     300 THC                         50 Fentanyl                     5 Methadone                   300  Trazodone  is metabolized in vivo to several metabolites,  including pharmacologically active m-CPP, which is excreted in the  urine.  Immunoassay screens for amphetamines and MDMA have potential  cross-reactivity with these compounds and may provide false positive  result.  Performed at [facility], [address], [place], [place] [address]    Glucose-Capillary 12/17/2023 215 (H)  70 - 99 mg/dL Final   Glucose reference range applies only to samples taken after fasting for at least 8  hours.  Admission on 12/09/2023, Discharged on 12/11/2023  Component Date Value Ref Range Status  WBC 12/10/2023 7.8  4.0 - 10.5 K/uL Final   RBC 12/10/2023 5.30  4.22 - 5.81 MIL/uL Final   Hemoglobin 12/10/2023 14.6  13.0 - 17.0 g/dL Final   HCT 90/86/7974 43.8  39.0 - 52.0 % Final   MCV 12/10/2023 82.6  80.0 - 100.0 fL Final   MCH 12/10/2023 27.5  26.0 - 34.0 pg Final   MCHC 12/10/2023 33.3  30.0 - 36.0 g/dL Final   RDW 90/86/7974 13.2  11.5 - 15.5 % Final   Platelets 12/10/2023 264  150 - 400 K/uL Final   nRBC 12/10/2023 0.0  0.0 - 0.2 % Final   Neutrophils Relative % 12/10/2023 57  % Final   Neutro Abs 12/10/2023 4.6  1.7 - 7.7 K/uL Final   Lymphocytes Relative 12/10/2023 30  % Final   Lymphs Abs 12/10/2023 2.3  0.7 - 4.0 K/uL Final   Monocytes Relative 12/10/2023 7  % Final   Monocytes Absolute 12/10/2023 0.5  0.1 - 1.0 K/uL Final   Eosinophils Relative 12/10/2023 5  % Final   Eosinophils Absolute 12/10/2023 0.4  0.0 - 0.5 K/uL Final   Basophils Relative 12/10/2023 1  % Final   Basophils Absolute 12/10/2023 0.0  0.0 - 0.1 K/uL Final   Immature Granulocytes 12/10/2023 0  % Final   Abs Immature Granulocytes 12/10/2023 0.02  0.00 - 0.07 K/uL Final   Performed at [facility], [address] [place], [place] [address]   Sodium 12/10/2023 137  135 - 145 mmol/L Final   Potassium 12/10/2023 4.0  3.5 - 5.1 mmol/L Final   Chloride 12/10/2023 99  98 - 111 mmol/L Final   CO2 12/10/2023 23  22 - 32 mmol/L Final   Glucose, Bld 12/10/2023 213 (H)  70 - 99 mg/dL Final   Glucose reference range applies only to samples taken after fasting for at least 8 hours.   BUN 12/10/2023 13  8 - 23 mg/dL Final   Creatinine, Ser 12/10/2023 1.03  0.61 - 1.24 mg/dL Final   Calcium  12/10/2023 9.3  8.9 - 10.3 mg/dL Final   Total Protein 90/86/7974 6.9  6.5 - 8.1 g/dL Final   Albumin 90/86/7974 3.9  3.5 - 5.0 g/dL Final   AST 90/86/7974 23  15 - 41 U/L Final   ALT 12/10/2023 27  0 - 44 U/L Final    Alkaline Phosphatase 12/10/2023 53  38 - 126 U/L Final   Total Bilirubin 12/10/2023 0.5  0.0 - 1.2 mg/dL Final   GFR, Estimated 12/10/2023 >60  >60 mL/min Final   Comment: (NOTE) Calculated using the CKD-EPI Creatinine Equation (2021)    Anion gap 12/10/2023 15  5 - 15 Final   Performed at [facility], [address] [place], [place] [address]   Hgb A1c MFr Bld 12/10/2023 9.9 (H)  4.8 - 5.6 % Final   Comment: (NOTE) Diagnosis of Diabetes The following HbA1c ranges recommended by the American Diabetes Association (ADA) may be used as an aid in the diagnosis of diabetes mellitus.  Hemoglobin             Suggested A1C NGSP%              Diagnosis  <5.7                   Non Diabetic  5.7-6.4                Pre-Diabetic  >6.4  Diabetic  <7.0                   Glycemic control for                       adults with diabetes.     Mean Plasma Glucose 12/10/2023 237.43  mg/dL Final   Performed at [facility], [address], [place], [place] [address]   Alcohol , Ethyl (B) 12/10/2023 <15  <15 mg/dL Final   Comment: (NOTE) For medical purposes only. Performed at [facility], [address] [place], [place] [address]    TSH 12/10/2023 2.337  0.350 - 4.500 uIU/mL Final   Comment: Performed by a 3rd Generation assay with a functional sensitivity of <=0.01 uIU/mL. Performed at [facility], [address] [place], [place] [address]    POC Amphetamine UR 12/10/2023 None Detected  NONE DETECTED (Cut Off Level 1000 ng/mL) Final   POC Secobarbital (BAR) 12/10/2023 None Detected  NONE DETECTED (Cut Off Level 300 ng/mL) Final   POC Buprenorphine (BUP) 12/10/2023 None Detected  NONE DETECTED (Cut Off Level 10 ng/mL) Final   POC Oxazepam (BZO) 12/10/2023 None Detected  NONE DETECTED (Cut Off Level 300 ng/mL) Final   POC Cocaine UR 12/10/2023 None Detected  NONE DETECTED (Cut Off Level 300 ng/mL) Final   POC Methamphetamine UR 12/10/2023 None  Detected  NONE DETECTED (Cut Off Level 1000 ng/mL) Final   POC Morphine 12/10/2023 None Detected  NONE DETECTED (Cut Off Level 300 ng/mL) Final   POC Methadone UR 12/10/2023 None Detected  NONE DETECTED (Cut Off Level 300 ng/mL) Final   POC Oxycodone  UR 12/10/2023 None Detected  NONE DETECTED (Cut Off Level 100 ng/mL) Final   POC Marijuana UR 12/10/2023 Positive (A)  NONE DETECTED (Cut Off Level 50 ng/mL) Final   Glucose-Capillary 12/10/2023 214 (H)  70 - 99 mg/dL Final   Glucose reference range applies only to samples taken after fasting for at least 8 hours.   Glucose-Capillary 12/10/2023 180 (H)  70 - 99 mg/dL Final   Glucose reference range applies only to samples taken after fasting for at least 8 hours.   Glucose-Capillary 12/10/2023 180 (H)  70 - 99 mg/dL Final   Glucose reference range applies only to samples taken after fasting for at least 8 hours.   Glucose-Capillary 12/11/2023 211 (H)  70 - 99 mg/dL Final   Glucose reference range applies only to samples taken after fasting for at least 8 hours.   Glucose-Capillary 12/10/2023 278 (H)  70 - 99 mg/dL Final   Glucose reference range applies only to samples taken after fasting for at least 8 hours.  Admission on 11/06/2023, Discharged on 11/17/2023  Component Date Value Ref Range Status   Glucose-Capillary 11/07/2023 365 (H)  70 - 99 mg/dL Final   Glucose reference range applies only to samples taken after fasting for at least 8 hours.   Glucose-Capillary 11/07/2023 268 (H)  70 - 99 mg/dL Final   Glucose reference range applies only to samples taken after fasting for at least 8 hours.   Glucose-Capillary 11/07/2023 144 (H)  70 - 99 mg/dL Final   Glucose reference range applies only to samples taken after fasting for at least 8 hours.   Glucose-Capillary 11/07/2023 149 (H)  70 - 99 mg/dL Final   Glucose reference range applies only to samples taken after fasting for at least 8 hours.   Glucose-Capillary 11/08/2023 227 (  H)  70 - 99  mg/dL Final   Glucose reference range applies only to samples taken after fasting for at least 8 hours.   Glucose-Capillary 11/08/2023 228 (H)  70 - 99 mg/dL Final   Glucose reference range applies only to samples taken after fasting for at least 8 hours.   Comment 1 11/08/2023 Notify RN   Final   Glucose-Capillary 11/08/2023 153 (H)  70 - 99 mg/dL Final   Glucose reference range applies only to samples taken after fasting for at least 8 hours.   Glucose-Capillary 11/08/2023 130 (H)  70 - 99 mg/dL Final   Glucose reference range applies only to samples taken after fasting for at least 8 hours.   Comment 1 11/08/2023 Notify RN   Final   Glucose-Capillary 11/09/2023 297 (H)  70 - 99 mg/dL Final   Glucose reference range applies only to samples taken after fasting for at least 8 hours.   Glucose-Capillary 11/09/2023 286 (H)  70 - 99 mg/dL Final   Glucose reference range applies only to samples taken after fasting for at least 8 hours.   Glucose-Capillary 11/09/2023 196 (H)  70 - 99 mg/dL Final   Glucose reference range applies only to samples taken after fasting for at least 8 hours.   Glucose-Capillary 11/09/2023 309 (H)  70 - 99 mg/dL Final   Glucose reference range applies only to samples taken after fasting for at least 8 hours.   Glucose-Capillary 11/10/2023 157 (H)  70 - 99 mg/dL Final   Glucose reference range applies only to samples taken after fasting for at least 8 hours.   Glucose-Capillary 11/10/2023 270 (H)  70 - 99 mg/dL Final   Glucose reference range applies only to samples taken after fasting for at least 8 hours.   Comment 1 11/10/2023 Notify RN   Final   Glucose-Capillary 11/10/2023 138 (H)  70 - 99 mg/dL Final   Glucose reference range applies only to samples taken after fasting for at least 8 hours.   Glucose-Capillary 11/10/2023 208 (H)  70 - 99 mg/dL Final   Glucose reference range applies only to samples taken after fasting for at least 8 hours.   Glucose-Capillary  11/11/2023 195 (H)  70 - 99 mg/dL Final   Glucose reference range applies only to samples taken after fasting for at least 8 hours.   Comment 1 11/11/2023 Notify RN   Final   Glucose-Capillary 11/11/2023 243 (H)  70 - 99 mg/dL Final   Glucose reference range applies only to samples taken after fasting for at least 8 hours.   Glucose-Capillary 11/11/2023 136 (H)  70 - 99 mg/dL Final   Glucose reference range applies only to samples taken after fasting for at least 8 hours.   Glucose-Capillary 11/11/2023 187 (H)  70 - 99 mg/dL Final   Glucose reference range applies only to samples taken after fasting for at least 8 hours.   Glucose-Capillary 11/12/2023 194 (H)  70 - 99 mg/dL Final   Glucose reference range applies only to samples taken after fasting for at least 8 hours.   Comment 1 11/12/2023 Notify RN   Final   Glucose-Capillary 11/12/2023 207 (H)  70 - 99 mg/dL Final   Glucose reference range applies only to samples taken after fasting for at least 8 hours.   Glucose-Capillary 11/12/2023 192 (H)  70 - 99 mg/dL Final   Glucose reference range applies only to samples taken after fasting for at least 8 hours.  Glucose-Capillary 11/12/2023 170 (H)  70 - 99 mg/dL Final   Glucose reference range applies only to samples taken after fasting for at least 8 hours.   Glucose-Capillary 11/13/2023 188 (H)  70 - 99 mg/dL Final   Glucose reference range applies only to samples taken after fasting for at least 8 hours.   Glucose-Capillary 11/13/2023 256 (H)  70 - 99 mg/dL Final   Glucose reference range applies only to samples taken after fasting for at least 8 hours.   Glucose-Capillary 11/13/2023 171 (H)  70 - 99 mg/dL Final   Glucose reference range applies only to samples taken after fasting for at least 8 hours.   Glucose-Capillary 11/13/2023 234 (H)  70 - 99 mg/dL Final   Glucose reference range applies only to samples taken after fasting for at least 8 hours.   Glucose-Capillary 11/14/2023 170  (H)  70 - 99 mg/dL Final   Glucose reference range applies only to samples taken after fasting for at least 8 hours.   Glucose-Capillary 11/14/2023 224 (H)  70 - 99 mg/dL Final   Glucose reference range applies only to samples taken after fasting for at least 8 hours.   Glucose-Capillary 11/14/2023 242 (H)  70 - 99 mg/dL Final   Glucose reference range applies only to samples taken after fasting for at least 8 hours.   Glucose-Capillary 11/14/2023 209 (H)  70 - 99 mg/dL Final   Glucose reference range applies only to samples taken after fasting for at least 8 hours.   Glucose-Capillary 11/15/2023 222 (H)  70 - 99 mg/dL Final   Glucose reference range applies only to samples taken after fasting for at least 8 hours.   Glucose-Capillary 11/15/2023 177 (H)  70 - 99 mg/dL Final   Glucose reference range applies only to samples taken after fasting for at least 8 hours.   Glucose-Capillary 11/15/2023 122 (H)  70 - 99 mg/dL Final   Glucose reference range applies only to samples taken after fasting for at least 8 hours.   Glucose-Capillary 11/15/2023 217 (H)  70 - 99 mg/dL Final   Glucose reference range applies only to samples taken after fasting for at least 8 hours.   Comment 1 11/15/2023 Notify RN   Final   Glucose-Capillary 11/16/2023 187 (H)  70 - 99 mg/dL Final   Glucose reference range applies only to samples taken after fasting for at least 8 hours.   Comment 1 11/16/2023 Notify RN   Final   Glucose-Capillary 11/16/2023 242 (H)  70 - 99 mg/dL Final   Glucose reference range applies only to samples taken after fasting for at least 8 hours.   Glucose-Capillary 11/16/2023 215 (H)  70 - 99 mg/dL Final   Glucose reference range applies only to samples taken after fasting for at least 8 hours.   Comment 1 11/16/2023 Notify RN   Final   Glucose-Capillary 11/16/2023 157 (H)  70 - 99 mg/dL Final   Glucose reference range applies only to samples taken after fasting for at least 8 hours.    Glucose-Capillary 11/17/2023 195 (H)  70 - 99 mg/dL Final   Glucose reference range applies only to samples taken after fasting for at least 8 hours.   Comment 1 11/17/2023 Notify RN   Final  Admission on 11/06/2023, Discharged on 11/06/2023  Component Date Value Ref Range Status   SARS Coronavirus 2 by RT PCR 11/06/2023 NEGATIVE  NEGATIVE Final   Performed at [facility], [address], [place], [place] [address]   WBC 11/06/2023 6.6  4.0 - 10.5 K/uL Final   RBC 11/06/2023 5.32  4.22 - 5.81 MIL/uL Final   Hemoglobin 11/06/2023 14.6  13.0 - 17.0 g/dL Final   HCT 91/89/7974 42.9  39.0 - 52.0 % Final   MCV 11/06/2023 80.6  80.0 - 100.0 fL Final   MCH 11/06/2023 27.4  26.0 - 34.0 pg Final   MCHC 11/06/2023 34.0  30.0 - 36.0 g/dL Final   RDW 91/89/7974 12.9  11.5 - 15.5 % Final   Platelets 11/06/2023 289  150 - 400 K/uL Final   nRBC 11/06/2023 0.0  0.0 - 0.2 % Final   Neutrophils Relative % 11/06/2023 66  % Final   Neutro Abs 11/06/2023 4.4  1.7 - 7.7 K/uL Final   Lymphocytes Relative 11/06/2023 24  % Final   Lymphs Abs 11/06/2023 1.6  0.7 - 4.0 K/uL Final   Monocytes Relative 11/06/2023 5  % Final   Monocytes Absolute 11/06/2023 0.4  0.1 - 1.0 K/uL Final   Eosinophils Relative 11/06/2023 3  % Final   Eosinophils Absolute 11/06/2023 0.2  0.0 - 0.5 K/uL Final   Basophils Relative 11/06/2023 1  % Final   Basophils Absolute 11/06/2023 0.0  0.0 - 0.1 K/uL Final   Immature Granulocytes 11/06/2023 1  % Final   Abs Immature Granulocytes 11/06/2023 0.03  0.00 - 0.07 K/uL Final   Performed at [facility], [address], [place], [place] [address]   Sodium 11/06/2023 129 (L)  135 - 145 mmol/L Final   Potassium 11/06/2023 4.1  3.5 - 5.1 mmol/L Final   Chloride 11/06/2023 93 (L)  98 - 111 mmol/L Final   CO2 11/06/2023 21 (L)  22 - 32 mmol/L Final   Glucose, Bld 11/06/2023 399 (H)  70 - 99 mg/dL Final   Glucose reference range applies only to samples taken after fasting for at least  8 hours.   BUN 11/06/2023 12  8 - 23 mg/dL Final   Creatinine, Ser 11/06/2023 0.92  0.61 - 1.24 mg/dL Final   Calcium  11/06/2023 9.7  8.9 - 10.3 mg/dL Final   Total Protein 91/89/7974 7.2  6.5 - 8.1 g/dL Final   Albumin 91/89/7974 4.0  3.5 - 5.0 g/dL Final   AST 91/89/7974 16  15 - 41 U/L Final   ALT 11/06/2023 20  0 - 44 U/L Final   Alkaline Phosphatase 11/06/2023 54  38 - 126 U/L Final   Total Bilirubin 11/06/2023 0.8  0.0 - 1.2 mg/dL Final   GFR, Estimated 11/06/2023 >60  >60 mL/min Final   Comment: (NOTE) Calculated using the CKD-EPI Creatinine Equation (2021)    Anion gap 11/06/2023 15  5 - 15 Final   Performed at [facility], [address] [place], [place] [address]   Hgb A1c MFr Bld 11/06/2023 10.6 (H)  4.8 - 5.6 % Final   Comment: (NOTE)         Prediabetes: 5.7 - 6.4         Diabetes: >6.4         Glycemic control for adults with diabetes: <7.0    Mean Plasma Glucose 11/06/2023 258  mg/dL Final   Comment: (NOTE) Performed At: [facility] [address] [place], [place] [id number] Jennette Shorter MD Ey:[id number]    Magnesium  11/06/2023 2.0  1.7 - 2.4 mg/dL Final   Performed at [facility], [address], [place], [place] [address]  Alcohol , Ethyl (B) 11/06/2023 <15  <15 mg/dL Final   Comment: (NOTE) For medical purposes only. Performed at [facility], [address] [place], [place] [address]    Cholesterol 11/06/2023 279 (H)  0 - 200 mg/dL Final   Triglycerides 91/89/7974 282 (H)  <150 mg/dL Final   HDL 91/89/7974 60  >40 mg/dL Final   Total CHOL/HDL Ratio 11/06/2023 4.7  RATIO Final   VLDL 11/06/2023 56 (H)  0 - 40 mg/dL Final   LDL Cholesterol 11/06/2023 163 (H)  0 - 99 mg/dL Final   Comment:        Total Cholesterol/HDL:CHD Risk Coronary Heart Disease Risk Table                     Men   Women  1/2 Average Risk   3.4   3.3  Average Risk       5.0   4.4  2 X Average Risk   9.6   7.1  3 X Average Risk  23.4   11.0         Use the calculated Patient Ratio above and the CHD Risk Table to determine the patient's CHD Risk.        ATP III CLASSIFICATION (LDL):  <100     mg/dL   Optimal  899-870  mg/dL   Near or Above                    Optimal  130-159  mg/dL   Borderline  839-810  mg/dL   High  >809     mg/dL   Very High Performed at [facility], [address], [place], [place] [address]    TSH 11/06/2023 1.028  0.350 - 4.500 uIU/mL Final   Comment: Performed by a 3rd Generation assay with a functional sensitivity of <=0.01 uIU/mL. Performed at [facility], [address] [place], [place] [address]    RPR Ser Ql 11/06/2023 NON REACTIVE  NON REACTIVE Final   Performed at [facility], [address] [place], [place] [address]   Color, Urine 11/06/2023 YELLOW  YELLOW Final   APPearance 11/06/2023 CLEAR  CLEAR Final   Specific Gravity, Urine 11/06/2023 1.017  1.005 - 1.030 Final   pH 11/06/2023 6.0  5.0 - 8.0 Final   Glucose, UA 11/06/2023 >=500 (A)  NEGATIVE mg/dL Final   Hgb urine dipstick 11/06/2023 NEGATIVE  NEGATIVE Final   Bilirubin Urine 11/06/2023 NEGATIVE  NEGATIVE Final   Ketones, ur 11/06/2023 NEGATIVE  NEGATIVE mg/dL Final   Protein, ur 91/89/7974 NEGATIVE  NEGATIVE mg/dL Final   Nitrite 91/89/7974 NEGATIVE  NEGATIVE Final   Leukocytes,Ua 11/06/2023 NEGATIVE  NEGATIVE Final   RBC / HPF 11/06/2023 0-5  0 - 5 RBC/hpf Final   WBC, UA 11/06/2023 0-5  0 - 5 WBC/hpf Final   Bacteria, UA 11/06/2023 NONE SEEN  NONE SEEN Final   Squamous Epithelial / HPF 11/06/2023 0-5  0 - 5 /HPF Final   Performed at [facility], [address], [place], [place] [address]   POC Amphetamine UR 11/06/2023 None Detected  NONE DETECTED (Cut Off Level 1000 ng/mL) Final   POC Secobarbital (BAR) 11/06/2023 None Detected  NONE DETECTED (Cut Off Level 300 ng/mL) Final   POC Buprenorphine (BUP) 11/06/2023 None Detected  NONE DETECTED (Cut Off Level 10 ng/mL) Final   POC Oxazepam (BZO)  11/06/2023 None Detected  NONE DETECTED (Cut Off  Level 300 ng/mL) Final   POC Cocaine UR 11/06/2023 None Detected  NONE DETECTED (Cut Off Level 300 ng/mL) Final   POC Methamphetamine UR 11/06/2023 None Detected  NONE DETECTED (Cut Off Level 1000 ng/mL) Final   POC Morphine 11/06/2023 None Detected  NONE DETECTED (Cut Off Level 300 ng/mL) Final   POC Methadone UR 11/06/2023 None Detected  NONE DETECTED (Cut Off Level 300 ng/mL) Final   POC Oxycodone  UR 11/06/2023 None Detected  NONE DETECTED (Cut Off Level 100 ng/mL) Final   POC Marijuana UR 11/06/2023 None Detected  NONE DETECTED (Cut Off Level 50 ng/mL) Final   HIV Screen 4th Generation wRfx 11/06/2023 Non Reactive  Non Reactive Final   Performed at [facility], [address], [place], [place] [address]   Glucose-Capillary 11/06/2023 351 (H)  70 - 99 mg/dL Final   Glucose reference range applies only to samples taken after fasting for at least 8 hours.   Glucose-Capillary 11/06/2023 373 (H)  70 - 99 mg/dL Final   Glucose reference range applies only to samples taken after fasting for at least 8 hours.   Glucose-Capillary 11/06/2023 269 (H)  70 - 99 mg/dL Final   Glucose reference range applies only to samples taken after fasting for at least 8 hours.   Glucose-Capillary 11/06/2023 160 (H)  70 - 99 mg/dL Final   Glucose reference range applies only to samples taken after fasting for at least 8 hours.  Admission on 08/26/2023, Discharged on 08/31/2023  Component Date Value Ref Range Status   Glucose-Capillary 08/28/2023 163 (H)  70 - 99 mg/dL Final   Glucose reference range applies only to samples taken after fasting for at least 8 hours.   Glucose-Capillary 08/28/2023 151 (H)  70 - 99 mg/dL Final   Glucose reference range applies only to samples taken after fasting for at least 8 hours.   Glucose-Capillary 08/28/2023 126 (H)  70 - 99 mg/dL Final   Glucose reference range applies only to samples taken after fasting for at least 8  hours.   Comment 1 08/28/2023 Notify RN   Final   Glucose-Capillary 08/29/2023 191 (H)  70 - 99 mg/dL Final   Glucose reference range applies only to samples taken after fasting for at least 8 hours.   Comment 1 08/29/2023 Notify RN   Final   Glucose-Capillary 08/29/2023 177 (H)  70 - 99 mg/dL Final   Glucose reference range applies only to samples taken after fasting for at least 8 hours.   Glucose-Capillary 08/29/2023 157 (H)  70 - 99 mg/dL Final   Glucose reference range applies only to samples taken after fasting for at least 8 hours.   Glucose-Capillary 08/29/2023 168 (H)  70 - 99 mg/dL Final   Glucose reference range applies only to samples taken after fasting for at least 8 hours.   Comment 1 08/29/2023 Notify RN   Final   Glucose-Capillary 08/30/2023 215 (H)  70 - 99 mg/dL Final   Glucose reference range applies only to samples taken after fasting for at least 8 hours.   Glucose-Capillary 08/30/2023 220 (H)  70 - 99 mg/dL Final   Glucose reference range applies only to samples taken after fasting for at least 8 hours.   Glucose-Capillary 08/30/2023 161 (H)  70 - 99 mg/dL Final   Glucose reference range applies only to samples taken after fasting for at least 8 hours.   Glucose-Capillary 08/31/2023 155 (H)  70 - 99 mg/dL Final   Glucose  reference range applies only to samples taken after fasting for at least 8 hours.   Comment 1 08/31/2023 Notify RN   Final   Comment 2 08/31/2023 Document in Chart   Final  Admission on 08/25/2023, Discharged on 08/26/2023  Component Date Value Ref Range Status   WBC 08/25/2023 9.0  4.0 - 10.5 K/uL Final   RBC 08/25/2023 5.38  4.22 - 5.81 MIL/uL Final   Hemoglobin 08/25/2023 14.8  13.0 - 17.0 g/dL Final   HCT 94/70/7974 43.9  39.0 - 52.0 % Final   MCV 08/25/2023 81.6  80.0 - 100.0 fL Final   MCH 08/25/2023 27.5  26.0 - 34.0 pg Final   MCHC 08/25/2023 33.7  30.0 - 36.0 g/dL Final   RDW 94/70/7974 13.1  11.5 - 15.5 % Final   Platelets 08/25/2023  282  150 - 400 K/uL Final   nRBC 08/25/2023 0.0  0.0 - 0.2 % Final   Neutrophils Relative % 08/25/2023 68  % Final   Neutro Abs 08/25/2023 6.1  1.7 - 7.7 K/uL Final   Lymphocytes Relative 08/25/2023 23  % Final   Lymphs Abs 08/25/2023 2.1  0.7 - 4.0 K/uL Final   Monocytes Relative 08/25/2023 5  % Final   Monocytes Absolute 08/25/2023 0.5  0.1 - 1.0 K/uL Final   Eosinophils Relative 08/25/2023 3  % Final   Eosinophils Absolute 08/25/2023 0.3  0.0 - 0.5 K/uL Final   Basophils Relative 08/25/2023 1  % Final   Basophils Absolute 08/25/2023 0.1  0.0 - 0.1 K/uL Final   Immature Granulocytes 08/25/2023 0  % Final   Abs Immature Granulocytes 08/25/2023 0.02  0.00 - 0.07 K/uL Final   Performed at [facility], [address], [place], [place] [address]   Sodium 08/25/2023 136  135 - 145 mmol/L Final   Potassium 08/25/2023 3.9  3.5 - 5.1 mmol/L Final   Chloride 08/25/2023 102  98 - 111 mmol/L Final   CO2 08/25/2023 21 (L)  22 - 32 mmol/L Final   Glucose, Bld 08/25/2023 178 (H)  70 - 99 mg/dL Final   Glucose reference range applies only to samples taken after fasting for at least 8 hours.   BUN 08/25/2023 13  8 - 23 mg/dL Final   Creatinine, Ser 08/25/2023 1.14  0.61 - 1.24 mg/dL Final   Calcium  08/25/2023 9.7  8.9 - 10.3 mg/dL Final   Total Protein 94/70/7974 7.4  6.5 - 8.1 g/dL Final   Albumin 94/70/7974 4.2  3.5 - 5.0 g/dL Final   AST 94/70/7974 29  15 - 41 U/L Final   ALT 08/25/2023 30  0 - 44 U/L Final   Alkaline Phosphatase 08/25/2023 53  38 - 126 U/L Final   Total Bilirubin 08/25/2023 0.6  0.0 - 1.2 mg/dL Final   GFR, Estimated 08/25/2023 >60  >60 mL/min Final   Comment: (NOTE) Calculated using the CKD-EPI Creatinine Equation (2021)    Anion gap 08/25/2023 13  5 - 15 Final   Performed at [facility], [address], [place], [place] [address]   Hgb A1c MFr Bld 08/25/2023 9.4 (H)  4.8 - 5.6 % Final   Comment: (NOTE) Diagnosis of Diabetes The following HbA1c ranges  recommended by the American Diabetes Association (ADA) may be used as an aid in the diagnosis of diabetes mellitus.  Hemoglobin             Suggested A1C NGSP%  Diagnosis  <5.7                   Non Diabetic  5.7-6.4                Pre-Diabetic  >6.4                   Diabetic  <7.0                   Glycemic control for                       adults with diabetes.     Mean Plasma Glucose 08/25/2023 223.08  mg/dL Final   Performed at [facility], [address] [place], [place] [address]   Magnesium  08/25/2023 1.9  1.7 - 2.4 mg/dL Final   Performed at [facility], [address] [place], [place] [address]   Alcohol , Ethyl (B) 08/25/2023 <15  <15 mg/dL Final   Comment: (NOTE) For medical purposes only. Performed at [facility], [address], [place], [place] [address]    Cholesterol 08/25/2023 138  0 - 200 mg/dL Final   Triglycerides 94/70/7974 201 (H)  <150 mg/dL Final   HDL 94/70/7974 41  >40 mg/dL Final   Total CHOL/HDL Ratio 08/25/2023 3.4  RATIO Final   VLDL 08/25/2023 40  0 - 40 mg/dL Final   LDL Cholesterol 08/25/2023 57  0 - 99 mg/dL Final   Comment:        Total Cholesterol/HDL:CHD Risk Coronary Heart Disease Risk Table                     Men   Women  1/2 Average Risk   3.4   3.3  Average Risk       5.0   4.4  2 X Average Risk   9.6   7.1  3 X Average Risk  23.4   11.0        Use the calculated Patient Ratio above and the CHD Risk Table to determine the patient's CHD Risk.        ATP III CLASSIFICATION (LDL):  <100     mg/dL   Optimal  899-870  mg/dL   Near or Above                    Optimal  130-159  mg/dL   Borderline  839-810  mg/dL   High  >809     mg/dL   Very High Performed at [facility], [address] [place], [place] [address]    TSH 08/25/2023 1.049  0.350 - 4.500 uIU/mL Final   Comment: Performed by a 3rd Generation assay with a functional sensitivity of <=0.01 uIU/mL. Performed at [facility], [address] [place], [place] [address]    Color, Urine 08/25/2023 YELLOW  YELLOW Final   APPearance 08/25/2023 TURBID (A)  CLEAR Final   Specific Gravity, Urine 08/25/2023 1.028  1.005 - 1.030 Final   pH 08/25/2023 5.0  5.0 - 8.0 Final   Glucose, UA 08/25/2023 50 (A)  NEGATIVE mg/dL Final   Hgb urine dipstick 08/25/2023 NEGATIVE  NEGATIVE Final   Bilirubin Urine 08/25/2023 NEGATIVE  NEGATIVE Final   Ketones, ur 08/25/2023 5 (A)  NEGATIVE mg/dL Final   Protein, ur 94/70/7974 30 (A)  NEGATIVE mg/dL Final   Nitrite 94/70/7974 NEGATIVE  NEGATIVE Final  Leukocytes,Ua 08/25/2023 NEGATIVE  NEGATIVE Final   RBC / HPF 08/25/2023 6-10  0 - 5 RBC/hpf Final   WBC, UA 08/25/2023 0-5  0 - 5 WBC/hpf Final   Bacteria, UA 08/25/2023 FEW (A)  NONE SEEN Final   Squamous Epithelial / HPF 08/25/2023 0-5  0 - 5 /HPF Final   Mucus 08/25/2023 PRESENT   Final   Performed at [facility], [address], [place], [place] [address]   POC Amphetamine UR 08/25/2023 None Detected  NONE DETECTED (Cut Off Level 1000 ng/mL) Final   POC Secobarbital (BAR) 08/25/2023 None Detected  NONE DETECTED (Cut Off Level 300 ng/mL) Final   POC Buprenorphine (BUP) 08/25/2023 None Detected  NONE DETECTED (Cut Off Level 10 ng/mL) Final   POC Oxazepam (BZO) 08/25/2023 None Detected  NONE DETECTED (Cut Off Level 300 ng/mL) Final   POC Cocaine UR 08/25/2023 None Detected  NONE DETECTED (Cut Off Level 300 ng/mL) Final   POC Methamphetamine UR 08/25/2023 None Detected  NONE DETECTED (Cut Off Level 1000 ng/mL) Final   POC Morphine 08/25/2023 None Detected  NONE DETECTED (Cut Off Level 300 ng/mL) Final   POC Methadone UR 08/25/2023 None Detected  NONE DETECTED (Cut Off Level 300 ng/mL) Final   POC Oxycodone  UR 08/25/2023 None Detected  NONE DETECTED (Cut Off Level 100 ng/mL) Final   POC Marijuana UR 08/25/2023 None Detected  NONE DETECTED (Cut Off Level 50 ng/mL) Final    Allergies: Other  Medications:  Facility  Ordered Medications  Medication   acetaminophen  (TYLENOL ) tablet 650 mg   alum & mag hydroxide-simeth (MAALOX/MYLANTA) 200-200-20 MG/5ML suspension 30 mL   magnesium  hydroxide (MILK OF MAGNESIA) suspension 30 mL   OLANZapine  zydis (ZYPREXA ) disintegrating tablet 5 mg   OLANZapine  (ZYPREXA ) injection 5 mg   hydrOXYzine  (ATARAX ) tablet 25 mg   insulin  aspart (novoLOG ) injection 0-15 Units   [START ON 12/28/2023] amLODipine  (NORVASC ) tablet 10 mg   ARIPiprazole  (ABILIFY ) tablet 15 mg   atorvastatin  (LIPITOR) tablet 40 mg   buPROPion  (WELLBUTRIN  SR) 12 hr tablet 150 mg   [START ON 12/28/2023] glimepiride (AMARYL) tablet 5 mg   pantoprazole  (PROTONIX ) EC tablet 20 mg   metFORMIN  (GLUCOPHAGE ) tablet 1,000 mg   [START ON 12/28/2023] sertraline  (ZOLOFT ) tablet 100 mg   PTA Medications  Medication Sig   atorvastatin  (LIPITOR) 40 MG tablet Take 1 tablet (40 mg total) by mouth daily.   ARIPiprazole  (ABILIFY ) 15 MG tablet Take 1 tablet (15 mg total) by mouth daily.   prazosin  (MINIPRESS ) 1 MG capsule Take 1 capsule (1 mg total) by mouth at bedtime.   amLODipine  (NORVASC ) 10 MG tablet Take 10 mg by mouth daily.   traZODone  (DESYREL ) 50 MG tablet Take 1 tablet (50 mg total) by mouth at bedtime as needed for sleep.   glipiZIDE  (GLUCOTROL  XL) 5 MG 24 hr tablet Take 1 tablet (5 mg total) by mouth 2 (two) times daily before a meal.   sertraline  (ZOLOFT ) 100 MG tablet Take 2 tablets (200 mg total) by mouth daily.   lisinopril  (ZESTRIL ) 40 MG tablet Take 1 tablet (40 mg total) by mouth daily.   buPROPion  (WELLBUTRIN  SR) 150 MG 12 hr tablet Take 1 tablet (150 mg total) by mouth daily.   metFORMIN  (GLUCOPHAGE ) 1000 MG tablet Take 1 tablet (1,000 mg total) by mouth 2 (two) times daily with a meal.   lansoprazole  (PREVACID ) 15 MG capsule Take 1 capsule (15 mg total) by mouth daily.   insulin  lispro (HUMALOG )  100 UNIT/ML injection Inject 0-0.15 mLs (0-15 Units total) into the skin 4 (four) times daily -  before  meals and at bedtime. Inject 0-15 Units into the skin 3 (three) times daily with meals AND 0-5 Units at bedtime.      Medical Decision Making  Patient meets inpatient psychiatric treatment criteria due to safety concerns, active suicidal ideations with the plans and means to carry out a suicide attempt, patient is high risk for completion of suicide due to multiple risk factors.    1. Severe episode of recurrent major depressive disorder, without psychotic features (HCC) (Primary), continue Abilify  15 mg at bedtime, continue Wellbutrin  150 SR twice daily, continue Zoloft  100 mg daily.  Continue to monitor for safety given active SI.  2. Treatment-resistant depression, recommended inpatient patient could possibly benefit from ECT therapy.  3. Suicidal ideation,   4. Type 2 diabetes mellitus with other circulatory complication, with long-term current use of insulin  (HCC), SS-moderate, Glucose check -AC/HS, Continue metformin  and glimepiride   5. Essential hypertension, BP readings soft, Amlodipine  10 mg resume-parameters to hold Systolic<110/ diastolic <60  6. Hyperlipidemia, unspecified hyperlipidemia type, continue Atorvastatin      Recommendations  Based on my evaluation the patient does not appear to have an emergency medical condition. Inpatient treatment recommended. Suzen Lesches, NP 12/27/23  7:14 PM
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
## 2023-12-27 NOTE — ED Notes (Signed)
 Patient is alert and oriented X 4. He endorses SI with a plan to OD on medications. States he has had SI thoughts since he was 51. States SI was this morning. Endorses auditory hallucinations. Denies HI or VH. He denies pain. Skin check conducted by this nurse and Dwayne, MHT. Patient oriented to the unit and provided food and beverage. He denies any additional needs at this time. We will continue monitor for safety.

## 2023-12-28 ENCOUNTER — Other Ambulatory Visit: Payer: Self-pay

## 2023-12-28 ENCOUNTER — Inpatient Hospital Stay (HOSPITAL_COMMUNITY)
Admission: AD | Admit: 2023-12-28 | Discharge: 2024-01-02 | DRG: 885 | Disposition: A | Discharge status: Home / Self Care | Source: Intra-hospital

## 2023-12-28 ENCOUNTER — Encounter (HOSPITAL_COMMUNITY): Payer: Self-pay

## 2023-12-28 DIAGNOSIS — Z79899 Other long term (current) drug therapy: Secondary | ICD-10-CM

## 2023-12-28 DIAGNOSIS — K219 Gastro-esophageal reflux disease without esophagitis: Secondary | ICD-10-CM | POA: Diagnosis present

## 2023-12-28 DIAGNOSIS — E785 Hyperlipidemia, unspecified: Secondary | ICD-10-CM | POA: Diagnosis present

## 2023-12-28 DIAGNOSIS — Z794 Long term (current) use of insulin: Secondary | ICD-10-CM

## 2023-12-28 DIAGNOSIS — Z811 Family history of alcohol abuse and dependence: Secondary | ICD-10-CM | POA: Diagnosis not present

## 2023-12-28 DIAGNOSIS — F411 Generalized anxiety disorder: Secondary | ICD-10-CM | POA: Diagnosis present

## 2023-12-28 DIAGNOSIS — Z7984 Long term (current) use of oral hypoglycemic drugs: Secondary | ICD-10-CM

## 2023-12-28 DIAGNOSIS — Z5982 Transportation insecurity: Secondary | ICD-10-CM | POA: Diagnosis not present

## 2023-12-28 DIAGNOSIS — Z62810 Personal history of physical and sexual abuse in childhood: Secondary | ICD-10-CM

## 2023-12-28 DIAGNOSIS — Z85820 Personal history of malignant melanoma of skin: Secondary | ICD-10-CM

## 2023-12-28 DIAGNOSIS — I10 Essential (primary) hypertension: Secondary | ICD-10-CM | POA: Diagnosis present

## 2023-12-28 DIAGNOSIS — Z59868 Other specified financial insecurity: Secondary | ICD-10-CM | POA: Diagnosis not present

## 2023-12-28 DIAGNOSIS — Z5902 Unsheltered homelessness: Secondary | ICD-10-CM

## 2023-12-28 DIAGNOSIS — Z5941 Food insecurity: Secondary | ICD-10-CM

## 2023-12-28 DIAGNOSIS — F4310 Post-traumatic stress disorder, unspecified: Secondary | ICD-10-CM | POA: Diagnosis present

## 2023-12-28 DIAGNOSIS — Z823 Family history of stroke: Secondary | ICD-10-CM

## 2023-12-28 DIAGNOSIS — R45851 Suicidal ideations: Secondary | ICD-10-CM

## 2023-12-28 DIAGNOSIS — F332 Major depressive disorder, recurrent severe without psychotic features: Principal | ICD-10-CM | POA: Diagnosis present

## 2023-12-28 DIAGNOSIS — E1159 Type 2 diabetes mellitus with other circulatory complications: Secondary | ICD-10-CM | POA: Diagnosis present

## 2023-12-28 DIAGNOSIS — Z818 Family history of other mental and behavioral disorders: Secondary | ICD-10-CM | POA: Diagnosis not present

## 2023-12-28 DIAGNOSIS — I152 Hypertension secondary to endocrine disorders: Secondary | ICD-10-CM | POA: Diagnosis present

## 2023-12-28 DIAGNOSIS — E119 Type 2 diabetes mellitus without complications: Principal | ICD-10-CM | POA: Diagnosis present

## 2023-12-28 DIAGNOSIS — Z87891 Personal history of nicotine dependence: Secondary | ICD-10-CM | POA: Diagnosis not present

## 2023-12-28 DIAGNOSIS — Z9151 Personal history of suicidal behavior: Secondary | ICD-10-CM

## 2023-12-28 DIAGNOSIS — Z8261 Family history of arthritis: Secondary | ICD-10-CM

## 2023-12-28 DIAGNOSIS — Z23 Encounter for immunization: Secondary | ICD-10-CM | POA: Diagnosis not present

## 2023-12-28 LAB — GLUCOSE, CAPILLARY
Glucose-Capillary: 134 mg/dL — ABNORMAL HIGH (ref 70–99)
Glucose-Capillary: 138 mg/dL — ABNORMAL HIGH (ref 70–99)
Glucose-Capillary: 122 mg/dL — ABNORMAL HIGH (ref 70–99)
Glucose-Capillary: 109 mg/dL — ABNORMAL HIGH (ref 70–99)

## 2023-12-28 MED ORDER — OLANZAPINE 10 MG IM SOLR: 5.0000 mg | Freq: Three times a day (TID) | INTRAMUSCULAR | Status: DC | PRN

## 2023-12-28 MED ORDER — MAGNESIUM HYDROXIDE 400 MG/5ML PO SUSP: 30.0000 mL | Freq: Every day | ORAL | Status: DC | PRN

## 2023-12-28 MED ORDER — PNEUMOCOCCAL 20-VAL CONJ VACC 0.5 ML IM SUSY
0.5000 mL | PREFILLED_SYRINGE | INTRAMUSCULAR | Status: AC
  Administered 2023-12-29: 0.5 mL via INTRAMUSCULAR
  Filled 2023-12-28: qty 0.5

## 2023-12-28 MED ORDER — ARIPIPRAZOLE 15 MG PO TABS
15.0000 mg | ORAL_TABLET | Freq: Every day | ORAL | Status: DC
  Administered 2023-12-28 – 2024-01-01 (×5): 15 mg via ORAL
  Filled 2023-12-28 (×5): qty 1

## 2023-12-28 MED ORDER — ATORVASTATIN CALCIUM 40 MG PO TABS
40.0000 mg | ORAL_TABLET | Freq: Every day | ORAL | Status: DC
  Administered 2023-12-29 – 2024-01-02 (×5): 40 mg via ORAL
  Filled 2023-12-28 (×5): qty 1

## 2023-12-28 MED ORDER — PANTOPRAZOLE SODIUM 20 MG PO TBEC
20.0000 mg | DELAYED_RELEASE_TABLET | Freq: Every day | ORAL | Status: DC
  Administered 2023-12-29 – 2024-01-02 (×5): 20 mg via ORAL
  Filled 2023-12-28 (×5): qty 1

## 2023-12-28 MED ORDER — HYDROXYZINE HCL 25 MG PO TABS
25.0000 mg | ORAL_TABLET | Freq: Three times a day (TID) | ORAL | Status: DC | PRN
  Administered 2023-12-29 – 2024-01-01 (×3): 25 mg via ORAL
  Filled 2023-12-28 (×3): qty 1

## 2023-12-28 MED ORDER — METFORMIN HCL 500 MG PO TABS
1000.0000 mg | ORAL_TABLET | Freq: Two times a day (BID) | ORAL | Status: DC
  Administered 2023-12-28 – 2024-01-02 (×10): 1000 mg via ORAL
  Filled 2023-12-28 (×10): qty 2

## 2023-12-28 MED ORDER — INSULIN ASPART 100 UNIT/ML IJ SOLN
0.0000 [IU] | Freq: Three times a day (TID) | INTRAMUSCULAR | Status: DC
  Administered 2023-12-28 – 2023-12-29 (×2): 2 [IU] via SUBCUTANEOUS
  Administered 2023-12-29: 3 [IU] via SUBCUTANEOUS
  Administered 2023-12-30: 5 [IU] via SUBCUTANEOUS
  Administered 2023-12-30: 3 [IU] via SUBCUTANEOUS
  Administered 2023-12-31: 2 [IU] via SUBCUTANEOUS
  Administered 2023-12-31: 3 [IU] via SUBCUTANEOUS
  Administered 2024-01-01 – 2024-01-02 (×4): 2 [IU] via SUBCUTANEOUS

## 2023-12-28 MED ORDER — ALUM & MAG HYDROXIDE-SIMETH 200-200-20 MG/5ML PO SUSP: 30.0000 mL | ORAL | Status: DC | PRN

## 2023-12-28 MED ORDER — OLANZAPINE 5 MG PO TBDP: 5.0000 mg | ORAL_TABLET | Freq: Three times a day (TID) | ORAL | Status: DC | PRN

## 2023-12-28 MED ORDER — MELATONIN 5 MG PO TABS: 5.0000 mg | ORAL_TABLET | Freq: Every evening | ORAL | Status: DC | PRN

## 2023-12-28 MED ORDER — GLIMEPIRIDE 2 MG PO TABS
5.0000 mg | ORAL_TABLET | Freq: Every day | ORAL | Status: DC
  Administered 2023-12-29 – 2024-01-02 (×5): 5 mg via ORAL
  Filled 2023-12-28 (×5): qty 3

## 2023-12-28 MED ORDER — SERTRALINE HCL 100 MG PO TABS
100.0000 mg | ORAL_TABLET | Freq: Every day | ORAL | Status: DC
  Administered 2023-12-29: 100 mg via ORAL
  Filled 2023-12-28: qty 1

## 2023-12-28 MED ORDER — ACETAMINOPHEN 325 MG PO TABS
650.0000 mg | ORAL_TABLET | Freq: Four times a day (QID) | ORAL | Status: DC | PRN
  Administered 2023-12-29 – 2024-01-01 (×2): 650 mg via ORAL
  Filled 2023-12-28 (×2): qty 2

## 2023-12-28 MED ORDER — BUPROPION HCL ER (SR) 150 MG PO TB12
150.0000 mg | ORAL_TABLET | Freq: Two times a day (BID) | ORAL | Status: DC
  Administered 2023-12-28 – 2023-12-29 (×2): 150 mg via ORAL
  Filled 2023-12-28 (×2): qty 1

## 2023-12-28 MED ORDER — INFLUENZA VIRUS VACC SPLIT PF (FLUZONE) 0.5 ML IM SUSY
0.5000 mL | PREFILLED_SYRINGE | INTRAMUSCULAR | Status: AC
  Administered 2023-12-29: 0.5 mL via INTRAMUSCULAR
  Filled 2023-12-28: qty 0.5

## 2023-12-28 NOTE — ED Notes (Addendum)
 Pt rates depression 0/10 and anxiety 0/10. Pt reports a good appetite, and no physical problems. Pt denies SI/HI/AVH and verbally contracts for safety. Provided support and encouragement. Pt safe on the unit. Q 15 minute safety checks continued.

## 2023-12-28 NOTE — Plan of Care (Signed)
   Problem: Education: Goal: Knowledge of [facility] Education information/materials will improve Outcome: Progressing Goal: Verbalization of understanding the information provided will improve Outcome: Progressing

## 2023-12-28 NOTE — Progress Notes (Signed)
 Pt has been accepted to [facility] on 12/28/2023 Bed assignment: 303-02  Pt meets inpatient criteria per: Suzen Lesches NP  Attending Physician will be: Dr. Prentis    Report can be called to:  unit: Adult unit: [phone]  Pt can arrive after 1 PM   Care Team Notified: [facility] McNichol RN, Suzen Lesches NP, Ryta Sherleen RN,  [place] Denene Alamillo LCSW-A   12/28/2023 11:47 AM

## 2023-12-28 NOTE — Tx Team (Signed)
 Initial Treatment Plan 12/28/2023 2:41 PM [name] FMW:[id number]    PATIENT STRESSORS: Financial difficulties   Marital or family conflict     PATIENT STRENGTHS: Motivation for treatment/growth    PATIENT IDENTIFIED PROBLEMS:   Depression    Homeless- living in car    SI plan to OD on medication           DISCHARGE CRITERIA:  Adequate post-discharge living arrangements Improved stabilization in mood, thinking, and/or behavior  PRELIMINARY DISCHARGE PLAN: Outpatient therapy Placement in alternative living arrangements  PATIENT/FAMILY INVOLVEMENT: This treatment plan has been presented to and reviewed with the patient, [name]. The patient has been given the opportunity to ask questions and make suggestions.  Shanda BIRCH Melanye Hiraldo, RN 12/28/2023, 2:41 PM

## 2023-12-28 NOTE — Group Note (Signed)
 Date:  12/28/2023 Time:  3:36 PM  Group Topic/Focus: Sleep Hygiene Dimensions of Wellness:   The focus of this group is to introduce the topic of wellness and discuss the role each dimension of wellness plays in total health.    Participation Level:  Active  Participation Quality:  Appropriate  Affect:  Appropriate  Cognitive:  Appropriate  Insight: Appropriate  Engagement in Group:  Engaged  Modes of Intervention:  Discussion  Additional Comments:  Pt was engaged appropriately in group.  Maevis Mumby D Dublin Cantero 12/28/2023, 3:36 PM

## 2023-12-28 NOTE — Progress Notes (Signed)

## 2023-12-28 NOTE — Progress Notes (Addendum)
 Patient ID: [name], male   DOB: 07/07/1960, 63 y.o.   MRN: [id number]  Ashaz Robling is a 63 y/o male voluntarily admitted to [facility] on 12/28/23 due to active SI with a plan to OD on medications. Pt endorses continued depression and SI due to being unhoused and having to live in his car for the past 3 weeks. Pt was recently discharged from [facility] on 12/18/23.  Pt denied SI/HI/AVH during admission. Pt denied tobacco use. Pt reported that he drinks about 1 beer every 2 weeks. Pt reports recreational marijuana use. Pt's skin assessed with Niel, MHT and belongings searched and locked in locker #33. Pt remained calm, cooperative, and pleasant during admission.  Pt made a high fall risk due to reported generalized weakness. Influenza and pneumonia vaccines ordered per pt request. Pt oriented to unit. Q 15 minute safety checks initiated.

## 2023-12-28 NOTE — Progress Notes (Signed)
 Pt is awake, alert and oriented X4. Pt complained of generalized pain. No signs of acute distress noted. PRN Acetaminophen  and scheduled meds administered per order. Pt denies current SI/HI/AVH, plan or intent. Staff will monitor pt's safety.

## 2023-12-28 NOTE — Progress Notes (Signed)
 Report called to Erie Noe, RN [facility].

## 2023-12-28 NOTE — ED Notes (Signed)
 [name] transferred to [facility] per NP order. Discussed with the patient and all questions fully answered. An After Visit Summary, EMTALA and Med Necessity forms were printed and to be given to the receiving nurse. All belongings returned. Patient escorted out and transferred via safe transport. Lanisha Stepanian  Sherleen  12/28/2023 1:32 PM

## 2023-12-28 NOTE — ED Provider Notes (Signed)
 FBC/OBS ASAP Discharge Summary  Date and Time: 12/28/2023 9:21 AM  Name: [name]  MRN:  [id number]   Discharge Diagnoses:  Final diagnoses:  Severe episode of recurrent major depressive disorder, without psychotic features (HCC)  Treatment-resistant depression  Suicidal ideation  Type 2 diabetes mellitus with other circulatory complication, with long-term current use of insulin  (HCC)  Essential hypertension  Hyperlipidemia, unspecified hyperlipidemia type    Subjective: I have been having rough days...  [name] is a 63 year-old male who presented with severe, recurrent depression  after he called the behavioral health response team, reporting that he was experiencing active suicidal thoughts, planning to overdose on medications.  He was evaluated by the attending providers and reported continued depression and suicidal ideations. Reported living in the car due to housing issues. Presented with a hx of multiple psychiatric admissions and multiple medication trials. Was following up at [facility] but current established with [facility]-outpatient services. Next appoint ment scheduled 01/11/2024. Patient continues to experience resistant depression with suicidal ideations.  Per chart review: On 12/18/2023,  patient was discharged from [facility] whre he was admitted under Dr Prentis' care. He was discharged with the following medication regimen:  Wellbutrin  100 mg PO Daily Sertraline  100 mg PO daily Abilify  15 mg PO Daily Trazodone  50 mg PO  HS Prazosin  1 mg PO HS  He was also recommended to continue the following medications to address his medical needs (HTN, DM, Hyperlipidemia):  Atorvastatin  40 mg daily - lisinopril  40 mg daily - amlodipine  10 mg daily - Insulin  sliding scale - Glipizide  XR 5 mg daily - metformin  1000 mg BID w/meals He was also recommended to follow up at [facility] services.   On 12/09/2023, patient visited [facility] with depression and passive SI.   In  10/2023, patient was hospitalized at [facility]  for the same complaints.   Stay Summary: Patient was admitted to Observation unit for continued assessment and inpatient was recommended to address his ongoing, resisting depression  and suicidal ideations.  Patient is reevaluated today 12/28/2023 and chart reviewed. He continues to endorse increased depression and hopelessness/worthlessness. He is seen in bed awake, sad, flat, hopeless. He is alert and oriented x 4. He does admit to endorsing depression for a long time and nothing has really helped. He admits to feeling worthless and thinking about giving up yesterday. States currently lives in his car though he has his friend [name] to whom he conveys all his problems because I trust her, she is very supportive and understanding. Chse to stay in the car because I did not want to put pressure on anyone.  He reports that he does get SS income and [name] should be able to help him find housing. Reports housing is not his primary concern but his ongoing emotional instability with thoughts of wanting to end his life.  Patient is cooperative throughout this encounter. Does not appear to be preoccupied or responding to internal stimuli. His thought process is coherent and goal-directed. Speech is clear and  well articulated. His eye contact is fair. He does express motivation for treatment  and want his medications to be reviewed. He denies homicidal thoughts. Denies hallucinations. Reports that his appetite is good, that he slept great last night. Stets he is doing his best to live normal but continue to struggle with depression.   Patient continues to experience severe depressive symptoms with a recent suicidal thoughts and plan. He was planning to overdose on pills as confirmed by law enforcement  who found him in possession of pills in his hands. He is at high risk for suicide given his prior suicide attempt,  current condition, his psychosocial status and his  illness chronicity. Inpatient admission recommended for stabilization.   Total Time spent with patient: 45 minutes  Past Psychiatric History: MDD, severe Past Medical History: DM, HTN, Hyperlipidemia Family History: NA Family Psychiatric History: NA Social History: Currently homeless Tobacco Cessation:  N/A, patient does not currently use tobacco products  Current Medications:  Current Facility-Administered Medications  Medication Dose Route Frequency Provider Last Rate Last Admin   acetaminophen  (TYLENOL ) tablet 650 mg  650 mg Oral Q6H PRN Arloa Suzen RAMAN, NP   650 mg at 12/28/23 0800   alum & mag hydroxide-simeth (MAALOX/MYLANTA) 200-200-20 MG/5ML suspension 30 mL  30 mL Oral Q4H PRN Arloa Suzen RAMAN, NP       ARIPiprazole  (ABILIFY ) tablet 15 mg  15 mg Oral QHS Arloa Suzen RAMAN, NP   15 mg at 12/27/23 2034   atorvastatin  (LIPITOR) tablet 40 mg  40 mg Oral Daily Arloa Suzen RAMAN, NP   40 mg at 12/28/23 9197   buPROPion  (WELLBUTRIN  SR) 12 hr tablet 150 mg  150 mg Oral BID Arloa Suzen RAMAN, NP   150 mg at 12/28/23 0802   glimepiride (AMARYL) tablet 5 mg  5 mg Oral Q breakfast Arloa Suzen RAMAN, NP   5 mg at 12/28/23 9197   hydrOXYzine  (ATARAX ) tablet 25 mg  25 mg Oral TID PRN Arloa Suzen RAMAN, NP       insulin  aspart (novoLOG ) injection 0-15 Units  0-15 Units Subcutaneous TID WC Arloa Suzen RAMAN, NP   2 Units at 12/28/23 0818   magnesium  hydroxide (MILK OF MAGNESIA) suspension 30 mL  30 mL Oral Daily PRN Arloa Suzen RAMAN, NP       metFORMIN  (GLUCOPHAGE ) tablet 1,000 mg  1,000 mg Oral BID WC Arloa Suzen RAMAN, NP   1,000 mg at 12/28/23 0801   OLANZapine  (ZYPREXA ) injection 5 mg  5 mg Intramuscular TID PRN Arloa Suzen RAMAN, NP       OLANZapine  zydis (ZYPREXA ) disintegrating tablet 5 mg  5 mg Oral TID PRN Arloa Suzen RAMAN, NP       pantoprazole  (PROTONIX ) EC tablet 20 mg  20 mg Oral Daily Arloa Suzen RAMAN, NP   20 mg at 12/28/23 0802   sertraline  (ZOLOFT ) tablet 100 mg   100 mg Oral Daily Arloa Suzen RAMAN, NP   100 mg at 12/28/23 0802   Current Outpatient Medications  Medication Sig Dispense Refill   amLODipine  (NORVASC ) 10 MG tablet Take 10 mg by mouth daily.     ARIPiprazole  (ABILIFY ) 15 MG tablet Take 1 tablet (15 mg total) by mouth daily. 30 tablet 1   atorvastatin  (LIPITOR) 40 MG tablet Take 1 tablet (40 mg total) by mouth daily. 90 tablet 2   buPROPion  (WELLBUTRIN  SR) 150 MG 12 hr tablet Take 1 tablet (150 mg total) by mouth daily. 30 tablet 0   glipiZIDE  (GLUCOTROL  XL) 5 MG 24 hr tablet Take 1 tablet (5 mg total) by mouth 2 (two) times daily before a meal. 60 tablet 0   insulin  lispro (HUMALOG ) 100 UNIT/ML injection Inject 0-0.15 mLs (0-15 Units total) into the skin 4 (four) times daily -  before meals and at bedtime. Inject 0-15 Units into the skin 3 (three) times daily with meals AND 0-5 Units at bedtime. 20 mL 0   lansoprazole  (PREVACID ) 15 MG capsule Take 1  capsule (15 mg total) by mouth daily. 30 capsule 0   lisinopril  (ZESTRIL ) 40 MG tablet Take 1 tablet (40 mg total) by mouth daily. 30 tablet 0   metFORMIN  (GLUCOPHAGE ) 1000 MG tablet Take 1 tablet (1,000 mg total) by mouth 2 (two) times daily with a meal. 60 tablet 0   prazosin  (MINIPRESS ) 1 MG capsule Take 1 capsule (1 mg total) by mouth at bedtime. 30 capsule 1   sertraline  (ZOLOFT ) 100 MG tablet Take 2 tablets (200 mg total) by mouth daily. 60 tablet 0   traZODone  (DESYREL ) 50 MG tablet Take 1 tablet (50 mg total) by mouth at bedtime as needed for sleep. 30 tablet 0    PTA Medications:  Facility Ordered Medications  Medication   acetaminophen  (TYLENOL ) tablet 650 mg   alum & mag hydroxide-simeth (MAALOX/MYLANTA) 200-200-20 MG/5ML suspension 30 mL   magnesium  hydroxide (MILK OF MAGNESIA) suspension 30 mL   OLANZapine  zydis (ZYPREXA ) disintegrating tablet 5 mg   OLANZapine  (ZYPREXA ) injection 5 mg   hydrOXYzine  (ATARAX ) tablet 25 mg   insulin  aspart (novoLOG ) injection 0-15 Units    [COMPLETED] amLODipine  (NORVASC ) tablet 10 mg   ARIPiprazole  (ABILIFY ) tablet 15 mg   atorvastatin  (LIPITOR) tablet 40 mg   buPROPion  (WELLBUTRIN  SR) 12 hr tablet 150 mg   glimepiride (AMARYL) tablet 5 mg   pantoprazole  (PROTONIX ) EC tablet 20 mg   metFORMIN  (GLUCOPHAGE ) tablet 1,000 mg   sertraline  (ZOLOFT ) tablet 100 mg   PTA Medications  Medication Sig   atorvastatin  (LIPITOR) 40 MG tablet Take 1 tablet (40 mg total) by mouth daily.   ARIPiprazole  (ABILIFY ) 15 MG tablet Take 1 tablet (15 mg total) by mouth daily.   prazosin  (MINIPRESS ) 1 MG capsule Take 1 capsule (1 mg total) by mouth at bedtime.   amLODipine  (NORVASC ) 10 MG tablet Take 10 mg by mouth daily.   traZODone  (DESYREL ) 50 MG tablet Take 1 tablet (50 mg total) by mouth at bedtime as needed for sleep.   glipiZIDE  (GLUCOTROL  XL) 5 MG 24 hr tablet Take 1 tablet (5 mg total) by mouth 2 (two) times daily before a meal.   sertraline  (ZOLOFT ) 100 MG tablet Take 2 tablets (200 mg total) by mouth daily.   lisinopril  (ZESTRIL ) 40 MG tablet Take 1 tablet (40 mg total) by mouth daily.   buPROPion  (WELLBUTRIN  SR) 150 MG 12 hr tablet Take 1 tablet (150 mg total) by mouth daily.   metFORMIN  (GLUCOPHAGE ) 1000 MG tablet Take 1 tablet (1,000 mg total) by mouth 2 (two) times daily with a meal.   lansoprazole  (PREVACID ) 15 MG capsule Take 1 capsule (15 mg total) by mouth daily.   insulin  lispro (HUMALOG ) 100 UNIT/ML injection Inject 0-0.15 mLs (0-15 Units total) into the skin 4 (four) times daily -  before meals and at bedtime. Inject 0-15 Units into the skin 3 (three) times daily with meals AND 0-5 Units at bedtime.       12/28/2023    9:20 AM 12/12/2023    2:55 PM 03/10/2023   10:23 AM  Depression screen PHQ 2/9  Decreased Interest 2 2 0  Down, Depressed, Hopeless 2 3 2   PHQ - 2 Score 4 5 2   Altered sleeping 1 2 3   Tired, decreased energy 1 2 1   Change in appetite 1 2 1   Feeling bad or failure about yourself  2 3 3   Trouble  concentrating 2 2 1   Moving slowly or fidgety/restless 2 1 0  Suicidal thoughts 3 3 2  PHQ-9 Score 16 20 13   Difficult doing work/chores Very difficult Very difficult Somewhat difficult    Flowsheet Row ED from 12/27/2023 in [facility] Admission (Dis[facility]17/2023 in [facility]  C-SSRS RISK CATEGORY High Risk High Risk High Risk    Musculoskeletal  Strength & Muscle Tone: within normal limits Gait & Station: normal Patient leans: N/A  Psychiatric Specialty Exam  Presentation  General Appearance:  Casual  Eye Contact: Fair  Speech: Clear and Coherent  Speech Volume: Normal  Handedness: Right   Mood and Affect  Mood: Anxious; Depressed; Hopeless  Affect: Congruent   Thought Process  Thought Processes: Coherent  Descriptions of Associations:Intact  Orientation:Full (Time, Place and Person)  Thought Content:WDL  Diagnosis of Schizophrenia or Schizoaffective disorder in past: No    Hallucinations:Hallucinations: None  Ideas of Reference:None  Suicidal Thoughts:Suicidal Thoughts: Yes, Passive SI Passive Intent and/or Plan: Without Intent  Homicidal Thoughts:Homicidal Thoughts: No   Sensorium  Memory: Immediate Fair; Recent Fair; Remote Fair  Judgment: Fair  Insight: Fair   Chartered certified accountant: Fair  Attention Span: Fair  Recall: [employer] of Knowledge: Fair  Language: Fair   Psychomotor Activity  Psychomotor Activity: Psychomotor Activity: Normal   Assets  Assets: Communication Skills; Desire for Improvement; Social Support   Sleep  Sleep: Sleep: Good  No Safety Checks orders active in given range  Nutritional Assessment (For OBS and FBC admissions only) Has the patient had a weight loss or gain of 10 pounds or more in the last 3 months?: No Has the  patient had a decrease in food intake/or appetite?: No Does the patient have dental problems?: No Does the patient have eating habits or behaviors that may be indicators of an eating disorder including binging or inducing vomiting?: No Has the patient recently lost weight without trying?: 0 Has the patient been eating poorly because of a decreased appetite?: 0 Malnutrition Screening Tool Score: 0    Physical Exam  Physical Exam HENT:     Head: Normocephalic and atraumatic.     Right Ear: Tympanic membrane normal.     Left Ear: Tympanic membrane normal.     Nose: Nose normal.     Mouth/Throat:     Mouth: Mucous membranes are moist.  Eyes:     Extraocular Movements: Extraocular movements intact.     Pupils: Pupils are equal, round, and reactive to light.  Cardiovascular:     Rate and Rhythm: Normal rate.     Pulses: Normal pulses.  Pulmonary:     Effort: Pulmonary effort is normal.  Musculoskeletal:        General: Normal range of motion.     Cervical back: Normal range of motion.  Neurological:     General: No focal deficit present.     Mental Status: He is alert and oriented to person, place, and time.    Review of Systems  Constitutional: Negative.   HENT: Negative.    Eyes: Negative.   Respiratory: Negative.    Cardiovascular: Negative.   Gastrointestinal: Negative.   Genitourinary: Negative.   Musculoskeletal: Negative.   Skin: Negative.   Neurological: Negative.   Endo/Heme/Allergies: Negative.   Psychiatric/Behavioral:  Positive for depression and suicidal ideas. The patient is nervous/anxious.    Blood pressure 122/72, pulse 61, temperature 98.2 F (36.8 C), temperature source Oral, resp. rate 17, SpO2 97%. There is  no height or weight on file to calculate BMI.  Demographic Factors:  Male, Caucasian, Low socioeconomic status, and Living alone  Loss Factors: Housing: currently homeless  Historical Factors: Prior suicide attempts  Risk Reduction  Factors:   NA  Continued Clinical Symptoms:  Depression:   Hopelessness Severe Previous Psychiatric Diagnoses and Treatments  Cognitive Features That Contribute To Risk:  None    Suicide Risk:  Severe:  Frequent, intense, and enduring suicidal ideation, specific plan, no subjective intent, but some objective markers of intent (i.e., choice of lethal method), the method is accessible, some limited preparatory behavior, evidence of impaired self-control, severe dysphoria/symptomatology, multiple risk factors present, and few if any protective factors, particularly a lack of social support.  Plan Of Care/Follow-up recommendations:  Activity:  As tolerated Diet:  Regular  Disposition: Admission to [facility]. Accepted by Dr Prentis, MD  Randall Bouquet, NP 12/28/2023, 9:21 AM

## 2023-12-29 LAB — GLUCOSE, CAPILLARY
Glucose-Capillary: 141 mg/dL — ABNORMAL HIGH (ref 70–99)
Glucose-Capillary: 110 mg/dL — ABNORMAL HIGH (ref 70–99)
Glucose-Capillary: 154 mg/dL — ABNORMAL HIGH (ref 70–99)
Glucose-Capillary: 108 mg/dL — ABNORMAL HIGH (ref 70–99)

## 2023-12-29 MED ORDER — AMLODIPINE BESYLATE 5 MG PO TABS
10.0000 mg | ORAL_TABLET | Freq: Every day | ORAL | Status: DC
  Administered 2023-12-29 – 2024-01-02 (×5): 10 mg via ORAL
  Filled 2023-12-29 (×5): qty 2

## 2023-12-29 MED ORDER — BUPROPION HCL ER (XL) 300 MG PO TB24
300.0000 mg | ORAL_TABLET | Freq: Every day | ORAL | Status: DC
  Administered 2023-12-30 – 2024-01-01 (×3): 300 mg via ORAL
  Filled 2023-12-29 (×3): qty 1

## 2023-12-29 MED ORDER — PRAZOSIN HCL 1 MG PO CAPS
1.0000 mg | ORAL_CAPSULE | Freq: Every day | ORAL | Status: DC
  Administered 2023-12-29 – 2024-01-01 (×4): 1 mg via ORAL
  Filled 2023-12-29 (×4): qty 1

## 2023-12-29 MED ORDER — LISINOPRIL 20 MG PO TABS
40.0000 mg | ORAL_TABLET | Freq: Every day | ORAL | Status: DC
  Administered 2023-12-29 – 2024-01-02 (×5): 40 mg via ORAL
  Filled 2023-12-29 (×5): qty 2

## 2023-12-29 MED ORDER — SERTRALINE HCL 100 MG PO TABS
200.0000 mg | ORAL_TABLET | Freq: Every day | ORAL | Status: DC
  Administered 2023-12-30 – 2024-01-02 (×4): 200 mg via ORAL
  Filled 2023-12-29 (×4): qty 2

## 2023-12-29 NOTE — Group Note (Signed)
 Date:  12/29/2023 Time:  9:45 AM  Group Topic/Focus:  Goals Group:   The focus of this group is to help patients establish daily goals to achieve during treatment and discuss how the patient can incorporate goal setting into their daily lives to aide in recovery.    Participation Level:  Active  Participation Quality:  Appropriate  Affect:  Appropriate  Cognitive:  Appropriate  Insight: Appropriate  Engagement in Group:  Engaged  Modes of Intervention:  Discussion and Education  Additional Comments:    Alice Burnside R Angline Schweigert 12/29/2023, 9:45 AM

## 2023-12-29 NOTE — Progress Notes (Signed)
(  Sleep Hours) - 6.25 (Any PRNs that were needed, meds refused, or side effects to meds)- Tylenol  (Any disturbances and when (visitation, over night)- n/a (Concerns raised by the patient)- none (SI/HI/AVH)- denies

## 2023-12-29 NOTE — Plan of Care (Signed)
   Problem: Education: Goal: Knowledge of [facility] Education information/materials will improve Outcome: Progressing Goal: Emotional status will improve Outcome: Progressing Goal: Mental status will improve Outcome: Progressing Goal: Verbalization of understanding the information provided will improve Outcome: Progressing

## 2023-12-29 NOTE — Group Note (Signed)
 Date:  12/29/2023 Time:  4:25 PM  Group Topic/Focus:  Overcoming Stress:   The focus of this group is to define stress and help patients assess their triggers.    Participation Level:  Active  Participation Quality:  Appropriate  Affect:  Appropriate  Cognitive:  Appropriate  Insight: Appropriate  Engagement in Group:  Engaged  Modes of Intervention:  Discussion  Annalee  Cherise Fedder 12/29/2023, 4:25 PM

## 2023-12-29 NOTE — Plan of Care (Signed)
   Problem: Education: Goal: Knowledge of [facility] Education information/materials will improve Outcome: Progressing   Problem: Activity: Goal: Interest or engagement in activities will improve Outcome: Progressing   Problem: Coping: Goal: Ability to verbalize frustrations and anger appropriately will improve Outcome: Progressing   Problem: Safety: Goal: Periods of time without injury will increase Outcome: Progressing

## 2023-12-29 NOTE — Group Note (Signed)
 Date:  12/29/2023 Time:  2:54 PM  Group Topic/Focus:  Emotional Education: The session centered around the Circle of Control activity, which helps participants identify what aspects of their life they can control versus what is outside their control. This exercise encourages emotional awareness, personal empowerment, and stress management. Participants were guided to reflect on situations in their life and categorize them into three areas: Things I can control, Things I can influence, and Things I cannot control. The group was encouraged to reflect on the impact these distinctions have on their emotional well-being.  Participation Level:  Active  Participation Quality:  Attentive  Affect:  Appropriate  Cognitive:  Appropriate  Insight: Appropriate  Engagement in Group:  Engaged  Modes of Intervention:  Activity and Discussion  Additional Comments:    [name] R Tranquilino Fischler 12/29/2023, 2:54 PM

## 2023-12-29 NOTE — Progress Notes (Addendum)
 Pt denies SI/HI/AVH this morning. Pt reports he did not sleep well last night and is requesting to have trazodone  and prazosin  prescribed, reporting he takes these medications at home. Pt has been pleasant, calm, and cooperative and has been attending groups.    12/29/23 0820  Psych Admission Type (Psych Patients Only)  Admission Status Voluntary  Psychosocial Assessment  Patient Complaints Anxiety;Depression;Sleep disturbance  Eye Contact Fair  Facial Expression Animated  Affect Appropriate to circumstance  Speech Logical/coherent  Interaction Assertive  Motor Activity Slow  Appearance/Hygiene Unremarkable  Behavior Characteristics Cooperative;Appropriate to situation  Mood Anxious;Pleasant  Thought Process  Coherency WDL  Content WDL  Delusions None reported or observed  Perception WDL  Hallucination None reported or observed  Judgment Impaired  Confusion None  Danger to Self  Current suicidal ideation? Denies  Description of Suicide Plan No plan  Self-Injurious Behavior No self-injurious ideation or behavior indicators observed or expressed   Agreement Not to Harm Self Yes  Description of Agreement Verbal  Danger to Others  Danger to Others None reported or observed
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
## 2023-12-29 NOTE — Group Note (Signed)
 Date:  12/29/2023 Time:  9:32 PM  Group Topic/Focus:  Wrap-Up Group:   The focus of this group is to help patients review their daily goal of treatment and discuss progress on daily workbooks.    Participation Level:  Active  Participation Quality:  Appropriate and Attentive  Affect:  Appropriate  Cognitive:  Alert and Appropriate  Insight: Appropriate and Good  Engagement in Group:  Engaged  Modes of Intervention:  Discussion and Education  Additional Comments:  Pt attended and participated in wrap up group this evening and rated their day a 6/10. Pt stated that they went to all groups and went outside. While they are here pt would benefit from having their medications managed properly.   [name] 12/29/2023, 9:32 PM

## 2023-12-29 NOTE — Group Note (Signed)
 LCSW Group Therapy Note   Group Date: 12/29/2023 Start Time: 1100 End Time: 1200   Participation:  patient was present and actively participated in the discussion.  He was insightful.   Type of Therapy:  Group Therapy  Topic:  Speaking from the Heart:  Communicating with Understanding and Empathy  Objective:  To help participants develop effective communication skills to express themselves clearly, listen actively, and navigate conflicts in a healthy way.  Goals: Increase awareness of verbal and non-verbal communication skills. Practice using "I" statements and active listening techniques. Learn coping strategies for managing communication stress.  Summary:  Participants explored the importance of communication, discussed challenges, and practiced skills such as active listening and assertive expression. They reflected on past experiences and identified ways to improve communication in their daily lives.  Therapeutic Modalities: Cognitive-Behavioral Therapy (CBT): Restructuring negative thought patterns in communication. Mindfulness: Staying present and calm during conversations. Psychoeducation: Learning about effective communication techniques.   Wilhemenia Camba O Rasean Joos, LCSWA 12/29/2023  12:44 PM
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
## 2023-12-30 ENCOUNTER — Encounter (HOSPITAL_COMMUNITY): Payer: Self-pay

## 2023-12-30 LAB — GLUCOSE, CAPILLARY
Glucose-Capillary: 167 mg/dL — ABNORMAL HIGH (ref 70–99)
Glucose-Capillary: 113 mg/dL — ABNORMAL HIGH (ref 70–99)
Glucose-Capillary: 202 mg/dL — ABNORMAL HIGH (ref 70–99)
Glucose-Capillary: 152 mg/dL — ABNORMAL HIGH (ref 70–99)

## 2023-12-30 MED ORDER — INSULIN GLARGINE 100 UNITS/ML SOLOSTAR PEN
5.0000 [IU] | PEN_INJECTOR | Freq: Every day | SUBCUTANEOUS | Status: DC
Start: 2023-12-30 — End: 2023-12-30

## 2023-12-30 MED ORDER — TRAZODONE HCL 50 MG PO TABS
50.0000 mg | ORAL_TABLET | Freq: Every evening | ORAL | Status: DC | PRN
  Administered 2023-12-30 – 2024-01-01 (×3): 50 mg via ORAL
  Filled 2023-12-30 (×3): qty 1

## 2023-12-30 NOTE — Plan of Care (Signed)
  Problem: Education: Goal: Emotional status will improve Outcome: Progressing Goal: Mental status will improve Outcome: Progressing Goal: Verbalization of understanding the information provided will improve Outcome: Progressing   Problem: Coping: Goal: Ability to demonstrate self-control will improve Outcome: Progressing

## 2023-12-30 NOTE — Plan of Care (Signed)
  Problem: Activity: Goal: Interest or engagement in activities will improve Outcome: Progressing Goal: Sleeping patterns will improve Outcome: Progressing   Problem: Coping: Goal: Ability to verbalize frustrations and anger appropriately will improve Outcome: Progressing Goal: Ability to demonstrate self-control will improve Outcome: Progressing   Problem: Safety: Goal: Periods of time without injury will increase Outcome: Progressing

## 2023-12-30 NOTE — Plan of Care (Deleted)
  Problem: Activity: Goal: Interest or engagement in activities will improve 12/30/2023 1432 by Jane Jinnie GAILS, RN Outcome: Progressing 12/30/2023 1248 by Jane Jinnie GAILS, RN Outcome: Progressing Goal: Sleeping patterns will improve 12/30/2023 1432 by Jane Jinnie GAILS, RN Outcome: Progressing 12/30/2023 1248 by Jane Jinnie GAILS, RN Outcome: Progressing   Problem: Coping: Goal: Ability to verbalize frustrations and anger appropriately will improve 12/30/2023 1432 by Jane Jinnie GAILS, RN Outcome: Progressing 12/30/2023 1248 by Jane Jinnie GAILS, RN Outcome: Progressing Goal: Ability to demonstrate self-control will improve 12/30/2023 1432 by Jane Jinnie GAILS, RN Outcome: Progressing 12/30/2023 1248 by Jane Jinnie GAILS, RN Outcome: Progressing   Problem: Safety: Goal: Periods of time without injury will increase 12/30/2023 1432 by Jane Jinnie GAILS, RN Outcome: Progressing 12/30/2023 1248 by Jane Jinnie GAILS, RN Outcome: Progressing

## 2023-12-30 NOTE — BH IP Treatment Plan (Signed)
 Interdisciplinary Treatment and Diagnostic Plan Update  12/30/2023 Time of Session: 10:45 AM [name] MRN: [id number]  Principal Diagnosis: Major depressive disorder, recurrent, severe without psychotic features (HCC)  Secondary Diagnoses: Principal Problem:   Major depressive disorder, recurrent, severe without psychotic features (HCC) Active Problems:   Type 2 diabetes mellitus (HCC)   Hypertension associated with diabetes (HCC)   Current Medications:  Current Facility-Administered Medications  Medication Dose Route Frequency Provider Last Rate Last Admin   acetaminophen  (TYLENOL ) tablet 650 mg  650 mg Oral Q6H PRN Randall Starlyn HERO, NP   650 mg at 12/29/23 0352   alum & mag hydroxide-simeth (MAALOX/MYLANTA) 200-200-20 MG/5ML suspension 30 mL  30 mL Oral Q4H PRN Randall Starlyn HERO, NP       amLODipine  (NORVASC ) tablet 10 mg  10 mg Oral Daily Prentis Kitchens A, DO   10 mg at 12/30/23 0801   ARIPiprazole  (ABILIFY ) tablet 15 mg  15 mg Oral QHS Byungura, Veronique M, NP   15 mg at 12/29/23 2153   atorvastatin  (LIPITOR) tablet 40 mg  40 mg Oral Daily Byungura, Veronique M, NP   40 mg at 12/30/23 0801   buPROPion  (WELLBUTRIN  XL) 24 hr tablet 300 mg  300 mg Oral Daily Prentis Kitchens A, DO   300 mg at 12/30/23 0801   glimepiride (AMARYL) tablet 5 mg  5 mg Oral Q breakfast Randall, Veronique M, NP   5 mg at 12/30/23 0801   hydrOXYzine  (ATARAX ) tablet 25 mg  25 mg Oral TID PRN Randall Starlyn HERO, NP   25 mg at 12/29/23 2153   insulin  aspart (novoLOG ) injection 0-15 Units  0-15 Units Subcutaneous TID WC Byungura, Veronique M, NP   3 Units at 12/30/23 9282   lisinopril  (ZESTRIL ) tablet 40 mg  40 mg Oral Daily Prentis Kitchens A, DO   40 mg at 12/30/23 0801   magnesium  hydroxide (MILK OF MAGNESIA) suspension 30 mL  30 mL Oral Daily PRN Randall Starlyn HERO, NP       metFORMIN  (GLUCOPHAGE ) tablet 1,000 mg  1,000 mg Oral BID WC Byungura, Veronique M, NP   1,000 mg at 12/30/23 0801    OLANZapine  (ZYPREXA ) injection 5 mg  5 mg Intramuscular TID PRN Randall Starlyn HERO, NP       OLANZapine  zydis (ZYPREXA ) disintegrating tablet 5 mg  5 mg Oral TID PRN Randall Starlyn HERO, NP       pantoprazole  (PROTONIX ) EC tablet 20 mg  20 mg Oral Daily Byungura, Veronique M, NP   20 mg at 12/30/23 0801   prazosin  (MINIPRESS ) capsule 1 mg  1 mg Oral QHS Bouchard, Marc A, DO   1 mg at 12/29/23 2152   sertraline  (ZOLOFT ) tablet 200 mg  200 mg Oral Daily Prentis Kitchens A, DO   200 mg at 12/30/23 0801   traZODone  (DESYREL ) tablet 50 mg  50 mg Oral QHS PRN Prentis Kitchens A, DO       PTA Medications: No medications prior to admission.    Patient Stressors: Financial difficulties   Marital or family conflict    Patient Strengths: Motivation for treatment/growth   Treatment Modalities: Medication Management, Group therapy, Case management,  1 to 1 session with clinician, Psychoeducation, Recreational therapy.   Physician Treatment Plan for Primary Diagnosis: Major depressive disorder, recurrent, severe without psychotic features (HCC) Long Term Goal(s): Improvement in symptoms so as ready for discharge   Short Term Goals: Ability to identify changes in lifestyle to reduce recurrence of condition will  improve Ability to verbalize feelings will improve Ability to disclose and discuss suicidal ideas Ability to identify and develop effective coping behaviors will improve  Medication Management: Evaluate patient's response, side effects, and tolerance of medication regimen.  Therapeutic Interventions: 1 to 1 sessions, Unit Group sessions and Medication administration.  Evaluation of Outcomes: Not Progressing  Physician Treatment Plan for Secondary Diagnosis: Principal Problem:   Major depressive disorder, recurrent, severe without psychotic features (HCC) Active Problems:   Type 2 diabetes mellitus (HCC)   Hypertension associated with diabetes (HCC)  Long Term Goal(s): Improvement in  symptoms so as ready for discharge   Short Term Goals: Ability to identify changes in lifestyle to reduce recurrence of condition will improve Ability to verbalize feelings will improve Ability to disclose and discuss suicidal ideas Ability to identify and develop effective coping behaviors will improve     Medication Management: Evaluate patient's response, side effects, and tolerance of medication regimen.  Therapeutic Interventions: 1 to 1 sessions, Unit Group sessions and Medication administration.  Evaluation of Outcomes: Not Progressing   RN Treatment Plan for Primary Diagnosis: Major depressive disorder, recurrent, severe without psychotic features (HCC) Long Term Goal(s): Knowledge of disease and therapeutic regimen to maintain health will improve  Short Term Goals: Ability to remain free from injury will improve, Ability to verbalize frustration and anger appropriately will improve, Ability to demonstrate self-control, Ability to participate in decision making will improve, Ability to verbalize feelings will improve, Ability to disclose and discuss suicidal ideas, Ability to identify and develop effective coping behaviors will improve, and Compliance with prescribed medications will improve  Medication Management: RN will administer medications as ordered by provider, will assess and evaluate patient's response and provide education to patient for prescribed medication. RN will report any adverse and/or side effects to prescribing provider.  Therapeutic Interventions: 1 on 1 counseling sessions, Psychoeducation, Medication administration, Evaluate responses to treatment, Monitor vital signs and CBGs as ordered, Perform/monitor CIWA, COWS, AIMS and Fall Risk screenings as ordered, Perform wound care treatments as ordered.  Evaluation of Outcomes: Not Progressing   LCSW Treatment Plan for Primary Diagnosis: Major depressive disorder, recurrent, severe without psychotic features  (HCC) Long Term Goal(s): Safe transition to appropriate next level of care at discharge, Engage patient in therapeutic group addressing interpersonal concerns.  Short Term Goals: Engage patient in aftercare planning with referrals and resources, Increase social support, Increase ability to appropriately verbalize feelings, Increase emotional regulation, Facilitate acceptance of mental health diagnosis and concerns, Facilitate patient progression through stages of change regarding substance use diagnoses and concerns, Identify triggers associated with mental health/substance abuse issues, and Increase skills for wellness and recovery  Therapeutic Interventions: Assess for all discharge needs, 1 to 1 time with Social worker, Explore available resources and support systems, Assess for adequacy in community support network, Educate family and significant other(s) on suicide prevention, Complete Psychosocial Assessment, Interpersonal group therapy.  Evaluation of Outcomes: Not Progressing   Progress in Treatment: Attending groups: Yes. Participating in groups: Yes. Taking medication as prescribed: Yes. Toleration medication: Yes. Family/Significant other contact made: No, will contact:  Recardo Boers (friend) [phone] Patient understands diagnosis: Yes. Discussing patient identified problems/goals with staff: Yes. Medical problems stabilized or resolved: Yes. Denies suicidal/homicidal ideation: Yes. Issues/concerns per patient self-inventory: No.  New problem(s) identified:  No  New Short Term/Long Term Goal(s):    medication stabilization, elimination of SI thoughts, development of comprehensive mental wellness plan.    Patient Goals:  I want to develop more resilience  so when things go wrong, I can stay strong and withstand the challenges.  Discharge Plan or Barriers:  Patient recently admitted. CSW will continue to follow and assess for appropriate referrals and possible discharge  planning.     Reason for Continuation of Hospitalization: Medication stabilization Suicidal ideation  Estimated Length of Stay:  5 - 7 days  Last 3 [place] Suicide Severity Risk Score: Flowsheet Row [facility]2023 [facility]Discharged[facility]  C-SSRS RISK CATEGORY High Risk High Risk High Risk    Last PHQ 2/9 Scores:    12/28/2023    9:20 AM 12/12/2023    2:55 PM 03/10/2023   10:23 AM  Depression screen PHQ 2/9  Decreased Interest 2 2 0  Down, Depressed, Hopeless 2 3 2   PHQ - 2 Score 4 5 2   Altered sleeping 1 2 3   Tired, decreased energy 1 2 1   Change in appetite 1 2 1   Feeling bad or failure about yourself  2 3 3   Trouble concentrating 2 2 1   Moving slowly or fidgety/restless 2 1 0  Suicidal thoughts 3 3 2   PHQ-9 Score 16 20 13   Difficult doing work/chores Very difficult Very difficult Somewhat difficult    Scribe for Treatment Team: Belinda Schlichting O Donnia Poplaski, LCSWA 12/30/2023 4:49 PM

## 2023-12-30 NOTE — Group Note (Signed)
 Recreation Therapy Group Note   Group Topic:Team Building  Group Date: 12/30/2023 Start Time: 0935 End Time: 1013 Facilitators: Caragh Gasper-McCall, LRT,CTRS Location: [address] Dayroom   Group Topic: Communication, Team Building, Problem Solving  Goal Area(s) Addresses:  Patient will effectively work with peer towards shared goal.  Patient will identify skills used to make activity successful.  Patient will identify how skills used during activity can be used to reach post d/c goals.   Behavioral Response: Engaged  Intervention: STEM Activity  Activity: Straw Bridge. In teams of 3-5, patients were given 15 plastic drinking straws and an equal length of masking tape. Using the materials provided, patients were instructed to build a free standing bridge-like structure to suspend an everyday item (ex: puzzle box) off of the floor or table surface. All materials were required to be used by the team in their design. LRT facilitated post-activity discussion reviewing team process. Patients were encouraged to reflect how the skills used in this activity can be generalized to daily life post discharge.   Education: Pharmacist, community, Scientist, physiological, Discharge Planning   Education Outcome: Acknowledges education/In group clarification offered/Needs additional education.    Affect/Mood: Appropriate   Participation Level: Engaged   Participation Quality: Independent   Behavior: Appropriate   Speech/Thought Process: Focused   Insight: Good   Judgement: Good   Modes of Intervention: STEM Activity   Patient Response to Interventions:  Engaged   Education Outcome:  In group clarification offered    Clinical Observations/Individualized Feedback: Pt was bright and attentive/offered ideas to complete the project. Pt was social and engaged throughout group.       Plan: Continue to engage patient in RT group sessions 2-3x/week.   Kenna Kirn-McCall, LRT,CTRS 12/30/2023 11:26  AM

## 2023-12-30 NOTE — Group Note (Signed)
 Date:  12/30/2023 Time:  9:24 AM  Group Topic/Focus:  Goals Group:   The focus of this group is to help patients establish daily goals to achieve during treatment and discuss how the patient can incorporate goal setting into their daily lives to aide in recovery.    Participation Level:  Active  Participation Quality:  Appropriate, Attentive, and Sharing  Affect:  Appropriate  Cognitive:  Alert and Appropriate  Insight: Appropriate and Improving  Engagement in Group:  Engaged and Improving  Modes of Intervention:  Discussion, Exploration, Socialization, and Support  Additional Comments:  Patient attended and participated in goals group.  Kristi HERO Harrel Ferrone 12/30/2023, 9:24 AM

## 2023-12-30 NOTE — Progress Notes (Signed)
   12/30/23 0915  Psych Admission Type (Psych Patients Only)  Admission Status Voluntary  Psychosocial Assessment  Patient Complaints Anxiety;Depression  Eye Contact Fair  Facial Expression Animated  Affect Appropriate to circumstance  Speech Logical/coherent  Interaction Assertive  Motor Activity Slow  Appearance/Hygiene Unremarkable  Behavior Characteristics Calm  Mood Anxious;Pleasant  Thought Process  Coherency WDL  Content WDL  Delusions None reported or observed  Perception WDL  Hallucination None reported or observed  Judgment Limited  Confusion None  Danger to Self  Current suicidal ideation? Denies  Agreement Not to Harm Self Yes  Description of Agreement Verbal  Danger to Others  Danger to Others None reported or observed

## 2023-12-30 NOTE — Progress Notes (Signed)
 [facility] Inpatient Psychiatry Progress Note  Date: 12/30/23 Patient: [name] MRN: [id number]  Assessment and Plan: Mr. Aarik Blank is a 63 year old male with severe, chronic, treatment-resistant MDD, GAD, and PTSD, two remote suicide attempts and multiple recent psychiatric hospitalizations, and significant psychosocial stressors (homelessness, financial strain, food insecurity). He is currently at high risk for suicide given chronic SI, recent plan, prior attempts, and lack of stable housing.    10/3 - Patient readmitted after returning to homelessness. He continues to present with intermediate to high risk for harm to self based upon recurrent suicidal ideation. No med changes today. Consider increasing glipizide  tomorrow.    # MDD, recurrent, severe, w/o psychotic features # PTSD - Sertraline  to 200 mg daily - Wellbutrin  XL 300 mg daily - Continue Abilify  15 mg daily - Trazodone  50 mg at bedtime - prazosin  1 mg qhs   # HTN # HLD # DM2 - Atorvastatin  40 mg daily - lisinopril  40 mg daily - amlodipine  10 mg daily - Insulin  sliding scale - Glipizide  XR 5 mg daily - metformin  1000 mg BID w/meals  Risk Assessment - High  Discharge Planning Barriers to discharge: Suicidal ideation Estimated length of stay: 4-5 days Predicted Discharge location: Homelessness     Interval History and update: Chart reviewed. No significant events overnight. Patient reported sleeping poorly last night. He reported continuing suicidal ideation today but without active intent. He has been taking his prescribed medications and denies any significant side effects. He has been visible and active in the milieu. He stated that he still plans on trying to go to his friend Julia's house after discharge if she will have him. He expresses continuing general hopelessness and demoralization regarding his chronic homelessness and the struggles he has experienced over the many months.  He states that he believes he would again be suicidal if discharged back to homelessness, though he tries to maintain resilience.       Physical Exam MSK/Neuro - Normal gait and station  Mental Status Exam Appearance - Casually dressed, appropriate hygiene and grooming  Attitude - Calm, polite, not guarded Speech - normal volume, prosody, inflection Mood - Alright Affect - Restricted Thought Process - LLGD Thought Content - No delusional TC expressed SI/HI - Ongoing passive SI Perceptions - Denies AVH; not RIS Judgement/Insight - Good  Fund of knowledge - WNL Language - No impairments      Lab Results:  Admission on 12/28/2023  Component Date Value Ref Range Status   Glucose-Capillary 12/28/2023 122 (H)  70 - 99 mg/dL Final   Glucose-Capillary 12/28/2023 109 (H)  70 - 99 mg/dL Final   Comment 1 89/98/7974 Notify RN   Final   Comment 2 12/28/2023 Document in Chart   Final   Glucose-Capillary 12/29/2023 141 (H)  70 - 99 mg/dL Final   Glucose-Capillary 12/29/2023 110 (H)  70 - 99 mg/dL Final   Glucose-Capillary 12/29/2023 154 (H)  70 - 99 mg/dL Final   Glucose-Capillary 12/29/2023 108 (H)  70 - 99 mg/dL Final   Glucose-Capillary 12/30/2023 167 (H)  70 - 99 mg/dL Final   Comment 1 89/96/7974 Notify RN   Final   Comment 2 12/30/2023 Document in Chart   Final   Glucose-Capillary 12/30/2023 113 (H)  70 - 99 mg/dL Final     Vitals: Blood pressure 125/71, pulse 65, temperature 97.7 F (36.5 C), temperature source Oral, resp. rate 16, height 5' 8 (1.727 m), weight 109.8 kg, SpO2 96%.  Oliva DELENA Salmon, DO

## 2023-12-30 NOTE — Plan of Care (Signed)
   Problem: Education: Goal: Knowledge of [facility] Education information/materials will improve Outcome: Progressing Goal: Emotional status will improve Outcome: Progressing Goal: Mental status will improve Outcome: Progressing Goal: Verbalization of understanding the information provided will improve Outcome: Progressing

## 2023-12-30 NOTE — Progress Notes (Signed)
(  Sleep Hours) - 7.5  (Any PRNs that were needed, meds refused, or side effects to meds)- hydroxyzine  25mg , Trazodone  50mg   (Any disturbances and when (visitation, over night)- none  (Concerns raised by the patient)- none   (SI/HI/AVH)- denies

## 2023-12-30 NOTE — BH Assessment (Signed)
(  Sleep Hours) - 6.75 (Any PRNs that were needed, meds refused, or side effects to meds)-  (Any disturbances and when (visitation, over night)- None (Concerns raised by the patient)- None (SI/HI/AVH)- Denies

## 2023-12-30 NOTE — Group Note (Signed)
 Date:  12/30/2023 Time:  6:17 PM  Group Topic/Focus:  Developing a Wellness Toolbox:   The focus of this group is to help patients develop a wellness toolbox with skills and strategies to promote recovery upon discharge. Emotional Education:   The focus of this group is to discuss what feelings/emotions are, and how they are experienced.  Pt explore interventions such as exercise, medication management and talk therapy to maintain therapy when d/c for healthy living in community.   Participation Level:  Active  Participation Quality:  Appropriate, Sharing, and Supportive  Affect:  Appropriate  Cognitive:  Alert and Oriented  Insight: Good  Engagement in Group:  Engaged and Supportive  Modes of Intervention:  Discussion, Education, and Support  Additional Comments:   Kamren Heskett 12/30/2023, 6:17 PM

## 2023-12-31 LAB — GLUCOSE, CAPILLARY
Glucose-Capillary: 155 mg/dL — ABNORMAL HIGH (ref 70–99)
Glucose-Capillary: 116 mg/dL — ABNORMAL HIGH (ref 70–99)
Glucose-Capillary: 136 mg/dL — ABNORMAL HIGH (ref 70–99)
Glucose-Capillary: 114 mg/dL — ABNORMAL HIGH (ref 70–99)

## 2023-12-31 MED ORDER — INSULIN GLARGINE 100 UNIT/ML ~~LOC~~ SOLN
5.0000 [IU] | Freq: Every day | SUBCUTANEOUS | Status: DC
  Administered 2023-12-31 – 2024-01-01 (×2): 5 [IU] via SUBCUTANEOUS
  Filled 2023-12-31 (×3): qty 0.05

## 2023-12-31 NOTE — [facility] Group Notes (Signed)
 [facility] Group Notes:  (Nursing/MHT/Case Management/Adjunct)  Date:  12/31/2023  Time:  4:31 AM  Type of Therapy:  AA Group  Participation Level:  Did Not Attend  Participation Quality:    Affect:    Cognitive:    Insight:    Engagement in Group:   Modes of Intervention:    Summary of Progress/Problems:  [name] 12/31/2023, 4:31 AM

## 2023-12-31 NOTE — Hospital Course (Signed)
 The patient was admitted to inpatient psychiatry for suicidal ideation in the context of ongoing homelessness and lack of support. Upon admission his previously prescribed medications were resumed. These included sertraline  200 mg daily, prazosin  1 mg at bedtime, pantoprazole  20 mg daily, metformin  1000 mg BID, lisinopril  40 mg daily, glimepiride 5 mg daily, Lipitor 40 mg daily, Abilify  15 mg daily, and amlodipine  10 mg daily. He was placed on sliding scale insulin  to manage his type 2 diabetes and was started on insulin  glargine 5 units at bedtime as he has previously been on insulin  glargine but stopped due to lack of refrigeration. The patient integrated well into the unit and was an active participant. He reported rapid improvement in his mood and gradual resolution of suicidal ideation. He tolerated his prescribed medications well. SW reached out to his friend, Recardo, to confirm that he would be staying there.

## 2023-12-31 NOTE — Group Note (Signed)
 Date:  12/31/2023 Time:  10:16 AM  Group Topic/Focus: Social Wellness. Gratitude and Strengths  Psychoeducation on the benefits of gratitude and strengths-based thinking for social and emotional wellness. Guided group discussion: "What are you grateful for today?" Strengths identification activity: Participants listed 3 personal strengths and shared examples of when these were demonstrated. Partnered peer sharing to deepen connection and practice active listening. Group reflection: How gratitude and strengths can be used in daily life to improve social interactions.     Participation Level:  Active  Participation Quality:  Appropriate  Affect:  Appropriate  Cognitive:  Appropriate  Insight: Appropriate  Engagement in Group:  Engaged  Modes of Intervention:  Discussion, Socialization, and Support  Additional Comments:    [name] [name] 12/31/2023, 10:16 AM

## 2023-12-31 NOTE — Group Note (Signed)
 Date:  12/31/2023 Time:  1:48 AM  Group Topic/Focus:  Wrap-Up Group:   The focus of this group is to help patients review their daily goal of treatment and discuss progress on daily workbooks.    Participation Level:  Active  Participation Quality:  Appropriate and Sharing  Affect:  Appropriate  Cognitive:  Appropriate  Insight: Appropriate  Engagement in Group:  Engaged  Modes of Intervention:  Activity and Socialization  Additional Comments:  Patients shared one high point and low point from today. Patient's high point, attending the group today that talked about resilients. Patient did not have a low point from today. Patient rated his day a 9 out of 10 because of the interactions with peers. Patient's goal for today, medication stabilization and talk with doctor to see progress.   [name] 12/31/2023, 1:48 AM

## 2023-12-31 NOTE — Plan of Care (Signed)
  Problem: Coping: Goal: Ability to demonstrate self-control will improve Outcome: Progressing

## 2023-12-31 NOTE — Group Note (Signed)
 Date:  12/31/2023 Time:  4:33 PM  Group Topic/Focus:  Making Healthy Choices:   The focus of this group is to help patients identify negative/unhealthy choices they were using prior to admission and identify positive/healthier coping strategies to replace them upon discharge. Self Care:   The focus of this group is to help patients understand the importance of self-care and sleep hygiene in order to improve or restore emotional, physical, spiritual, interpersonal, and financial health.    Participation Level:  Active  Participation Quality:  Appropriate, Attentive, and Sharing  Affect:  Appropriate  Cognitive:  Alert  Insight: Appropriate  Engagement in Group:  Engaged and Supportive  Modes of Intervention:  Discussion   Powell JAYSON Sharps 12/31/2023, 4:33 PM

## 2023-12-31 NOTE — Progress Notes (Signed)
 [facility] Inpatient Psychiatry Progress Note  Date: 12/31/23 Patient: [name] MRN: [id number]  Assessment and Plan: [name] is a 63 year old male with severe, chronic, treatment-resistant MDD, GAD, and PTSD, two remote suicide attempts and multiple recent psychiatric hospitalizations, and significant psychosocial stressors (homelessness, financial strain, food insecurity). He is currently at high risk for suicide given chronic SI, recent plan, prior attempts, and lack of stable housing.    10/4 - Further improvement in mood. Will add back insulin  glargine 5 mg at bedtime for diabetes management. Plan to coordinate hopeful discharge to friend's house Monday.   [facility] Course tab for details)   # MDD, recurrent, severe, w/o psychotic features # PTSD - Sertraline  to 200 mg daily - Wellbutrin  XL 300 mg daily - Continue Abilify  15 mg daily - Trazodone  50 mg at bedtime - prazosin  1 mg qhs   # HTN # HLD # DM2 - Atorvastatin  40 mg daily - lisinopril  40 mg daily - amlodipine  10 mg daily - Insulin  sliding scale - glimepiride 5 mg daily - metformin  1000 mg BID w/meals  Risk Assessment - Intermediate (recent suicidal ideation). The patient is estimated to be at intermediate risk for imminent harm to self, and low for harm to others. While he reports resolution of active suicidal intent this largely remains contingent on not prematurely returning to homelessness without support.   Discharge Planning Estimated length of stay: 2-3 days Predicted Discharge location: Homelessness     Interval History and update: Chart reviewed. No significant events overnight. Patient reports an improved mood today without any active suicidal ideation. He has been engaging in groups and socializing well with peers. He denies any significant anxiety. No physical complaints. He denies any side effects or issues with his current medications. He expresses growing  optimism that this time the plan to discharge to the care of his friend will workout for him.       Physical Exam MSK/Neuro - Normal gait and station  Mental Status Exam Appearance - Casually dressed, appropriate hygiene and grooming  Attitude - Calm, polite, not guarded Speech - normal volume, prosody, inflection Mood - pretty good Affect - Full Thought Process - LLGD Thought Content - No delusional TC expressed SI/HI - Denies Perceptions - Denies AVH; not RIS Judgement/Insight - Good  Fund of knowledge - WNL Language - No impairments      Lab Results:  Admission on 12/28/2023  Component Date Value Ref Range Status   Glucose-Capillary 12/28/2023 122 (H)  70 - 99 mg/dL Final   Glucose-Capillary 12/28/2023 109 (H)  70 - 99 mg/dL Final   Comment 1 89/98/7974 Notify RN   Final   Comment 2 12/28/2023 Document in Chart   Final   Glucose-Capillary 12/29/2023 141 (H)  70 - 99 mg/dL Final   Glucose-Capillary 12/29/2023 110 (H)  70 - 99 mg/dL Final   Glucose-Capillary 12/29/2023 154 (H)  70 - 99 mg/dL Final   Glucose-Capillary 12/29/2023 108 (H)  70 - 99 mg/dL Final   Glucose-Capillary 12/30/2023 167 (H)  70 - 99 mg/dL Final   Comment 1 89/96/7974 Notify RN   Final   Comment 2 12/30/2023 Document in Chart   Final   Glucose-Capillary 12/30/2023 113 (H)  70 - 99 mg/dL Final   Glucose-Capillary 12/30/2023 202 (H)  70 - 99 mg/dL Final   Glucose-Capillary 12/30/2023 152 (H)  70 - 99 mg/dL Final   Glucose-Capillary 12/31/2023 155 (H)  70 - 99 mg/dL Final  Glucose-Capillary 12/31/2023 116 (H)  70 - 99 mg/dL Final     Vitals: Blood pressure 125/67, pulse 65, temperature 98.1 F (36.7 C), temperature source Oral, resp. rate 16, height 5' 8 (1.727 m), weight 109.8 kg, SpO2 96%.    Oliva DELENA Salmon, DO

## 2023-12-31 NOTE — Progress Notes (Signed)
   12/31/23 0920  Psych Admission Type (Psych Patients Only)  Admission Status Voluntary  Psychosocial Assessment  Patient Complaints Anxiety  Eye Contact Fair  Facial Expression Animated  Affect Appropriate to circumstance  Speech Logical/coherent  Interaction Assertive  Motor Activity Slow  Appearance/Hygiene Unremarkable  Behavior Characteristics Cooperative;Appropriate to situation  Mood Anxious;Pleasant  Thought Process  Coherency WDL  Content WDL  Delusions None reported or observed  Perception WDL  Hallucination None reported or observed  Judgment Limited  Confusion None  Danger to Self  Current suicidal ideation? Denies  Agreement Not to Harm Self Yes  Description of Agreement Verbal  Danger to Others  Danger to Others None reported or observed

## 2023-12-31 NOTE — Group Note (Signed)
 Date:  12/31/2023 Time:  9:36 AM  Group Topic/Focus:  Goals Group:   The focus of this group is to help patients establish daily goals to achieve during treatment and discuss how the patient can incorporate goal setting into their daily lives to aide in recovery. Orientation:   The focus of this group is to educate the patient on the purpose and policies of crisis stabilization and provide a format to answer questions about their admission.  The group details unit policies and expectations of patients while admitted.    Participation Level:  Active  Participation Quality:  Attentive, Sharing, and Supportive  Affect:  Appropriate  Cognitive:  Appropriate  Insight: Appropriate  Engagement in Group:  Engaged  Modes of Intervention:  Discussion  Additional Comments:    Jennell Janosik R Alanzo Lamb 12/31/2023, 9:36 AM

## 2023-12-31 NOTE — Group Note (Signed)
 Date:  12/31/2023 Time:  9:00 PM  Group Topic/Focus:  Wrap-Up Group:   The focus of this group is to help patients review their daily goal of treatment and discuss progress on daily workbooks.    Participation Level:  Active  Participation Quality:  Appropriate  Affect:  Appropriate  Cognitive:  Appropriate  Insight: Appropriate and Good  Engagement in Group:  Engaged  Modes of Intervention:  Discussion  Additional Comments:  Patient stated his day was a 10  out of 10 today stated that his goal is to try and become more social  and become more stable on his medication  [name] 12/31/2023, 9:00 PM

## 2023-12-31 NOTE — Plan of Care (Signed)
  Problem: Activity: Goal: Interest or engagement in activities will improve Outcome: Progressing Goal: Sleeping patterns will improve Outcome: Progressing   Problem: Coping: Goal: Ability to verbalize frustrations and anger appropriately will improve Outcome: Progressing Goal: Ability to demonstrate self-control will improve Outcome: Progressing   Problem: Safety: Goal: Periods of time without injury will increase Outcome: Progressing

## 2024-01-01 DIAGNOSIS — E1159 Type 2 diabetes mellitus with other circulatory complications: Secondary | ICD-10-CM

## 2024-01-01 DIAGNOSIS — F4310 Post-traumatic stress disorder, unspecified: Secondary | ICD-10-CM

## 2024-01-01 DIAGNOSIS — I152 Hypertension secondary to endocrine disorders: Secondary | ICD-10-CM

## 2024-01-01 DIAGNOSIS — F332 Major depressive disorder, recurrent severe without psychotic features: Principal | ICD-10-CM

## 2024-01-01 DIAGNOSIS — E785 Hyperlipidemia, unspecified: Secondary | ICD-10-CM

## 2024-01-01 LAB — GLUCOSE, CAPILLARY
Glucose-Capillary: 126 mg/dL — ABNORMAL HIGH (ref 70–99)
Glucose-Capillary: 139 mg/dL — ABNORMAL HIGH (ref 70–99)
Glucose-Capillary: 123 mg/dL — ABNORMAL HIGH (ref 70–99)
Glucose-Capillary: 134 mg/dL — ABNORMAL HIGH (ref 70–99)

## 2024-01-01 MED ORDER — BUPROPION HCL ER (XL) 150 MG PO TB24
150.0000 mg | ORAL_TABLET | Freq: Every day | ORAL | Status: DC
  Administered 2024-01-02: 150 mg via ORAL
  Filled 2024-01-01: qty 1

## 2024-01-01 NOTE — Progress Notes (Signed)
 [facility] Inpatient Psychiatry Progress Note  Date: 01/01/24 Patient: [name] MRN: [id number]  Assessment and Plan: [name] is a 63 year old male with severe, chronic, treatment-resistant MDD, GAD, and PTSD, two remote suicide attempts and multiple recent psychiatric hospitalizations, and significant psychosocial stressors (homelessness, financial strain, food insecurity). He is currently at high risk for suicide given chronic SI, recent plan, prior attempts, and lack of stable housing.    10/5 - Hypertensive today. He is on max doses of amlodipine  and 40 mg of lisinopril . Will try lowering Bupropion  to 150 mg to see if this is responsible for the increase. Otherwise, consider increasing lisinopril  to 60 mg daily. Plan for patient as of today is to coordinate with SW on Monday to ensure that his friend [name] is home and available to receive him as returning to live in his vehicle is very likely to result in a relapse of suicidal ideation after a couple days.  [facility] Course tab for details)   # MDD, recurrent, severe, w/o psychotic features # PTSD - Sertraline  to 200 mg daily - Wellbutrin  XL 150 mg daily (decreased) - Continue Abilify  15 mg daily - Trazodone  50 mg at bedtime - prazosin  1 mg qhs   # HTN # HLD # DM2 - Atorvastatin  40 mg daily - lisinopril  40 mg daily - amlodipine  10 mg daily - Insulin  sliding scale - glimepiride 5 mg daily - metformin  1000 mg BID w/meals  Risk Assessment - Intermediate (recent suicidal ideation). The patient is estimated to be at intermediate risk for imminent harm to self, and low for harm to others. While he reports resolution of active suicidal intent this largely remains contingent on not prematurely returning to homelessness without support.   Discharge Planning Estimated length of stay: 2-3 days Predicted Discharge location: Homelessness     Interval History and update: Chart reviewed. No  significant events overnight. Patient reports a euthymic mood and denies SI today. He has been active in the milieu and socializing well with others. Reports ongoing shoulder pain today and has been receiving APAP for this with good effect. Denies any other issues or concerns. He is looking forward to potential discharge tomorrow. No side effects from current medications reported.        Physical Exam MSK/Neuro - Normal gait and station  Mental Status Exam Appearance - Casually dressed, appropriate hygiene and grooming  Attitude - Calm, polite, not guarded Speech - normal volume, prosody, inflection Mood - pretty good Affect - Full Thought Process - LLGD Thought Content - No delusional TC expressed SI/HI - Denies Perceptions - Denies AVH; not RIS Judgement/Insight - Good  Fund of knowledge - WNL Language - No impairments      Lab Results:  Admission on 12/28/2023  Component Date Value Ref Range Status   Glucose-Capillary 12/28/2023 122 (H)  70 - 99 mg/dL Final   Glucose-Capillary 12/28/2023 109 (H)  70 - 99 mg/dL Final   Comment 1 89/98/7974 Notify RN   Final   Comment 2 12/28/2023 Document in Chart   Final   Glucose-Capillary 12/29/2023 141 (H)  70 - 99 mg/dL Final   Glucose-Capillary 12/29/2023 110 (H)  70 - 99 mg/dL Final   Glucose-Capillary 12/29/2023 154 (H)  70 - 99 mg/dL Final   Glucose-Capillary 12/29/2023 108 (H)  70 - 99 mg/dL Final   Glucose-Capillary 12/30/2023 167 (H)  70 - 99 mg/dL Final   Comment 1 89/96/7974 Notify RN   Final  Comment 2 12/30/2023 Document in Chart   Final   Glucose-Capillary 12/30/2023 113 (H)  70 - 99 mg/dL Final   Glucose-Capillary 12/30/2023 202 (H)  70 - 99 mg/dL Final   Glucose-Capillary 12/30/2023 152 (H)  70 - 99 mg/dL Final   Glucose-Capillary 12/31/2023 155 (H)  70 - 99 mg/dL Final   Glucose-Capillary 12/31/2023 116 (H)  70 - 99 mg/dL Final   Glucose-Capillary 12/31/2023 136 (H)  70 - 99 mg/dL Final   Glucose-Capillary  12/31/2023 114 (H)  70 - 99 mg/dL Final   Glucose-Capillary 01/01/2024 126 (H)  70 - 99 mg/dL Final     Vitals: Blood pressure (!) 145/79, pulse 61, temperature 98 F (36.7 C), temperature source Oral, resp. rate 16, height 5' 8 (1.727 m), weight 109.8 kg, SpO2 98%.    Oliva DELENA Salmon, DO

## 2024-01-01 NOTE — Group Note (Signed)
 Date:  01/01/2024 Time:  6:24 PM  Group Topic/Focus:  Group used a non- competitive card game to build mindful listening skills and encourage acceptance and understanding of oneself and peers. Patients share, fostering empathy and personal growth in a supportive environment.    Participation Level:  Active  Participation Quality:  Appropriate and Attentive  Affect:  Appropriate  Cognitive:  Alert and Appropriate  Insight: Appropriate and Good  Engagement in Group:  Engaged and Supportive  Modes of Intervention:  Activity, Discussion, Exploration, Rapport Building, Socialization, and Support  Additional Comments:    [name] 01/01/2024, 6:24 PM

## 2024-01-01 NOTE — Plan of Care (Signed)
   Problem: Education: Goal: Knowledge of [place] [facility] Education information/materials will improve Outcome: Progressing Goal: Emotional status will improve Outcome: Progressing Goal: Mental status will improve Outcome: Progressing

## 2024-01-01 NOTE — Progress Notes (Signed)
 D. Pt has been pleasant during interactions- visible in the milieu, observed interacting well with peers and attending groups. Pt reported sleeping well last night, described his appetite and concentration as 'good', and energy level as 'normal'.  Per pt's self inventory, pt rated his depression,hopelessness and anxiety a 0/0/1, respectively. Pt's stated goal today is to continue stabilizing my mood with meds, groups and socializing with friends. Pt denied SI/HI and A/VH A. Labs and vitals monitored. Pt given and educated on medications.Pt received Tylenol  for 3/10 left shoulder pain.  Pt supported emotionally and encouraged to express concerns and ask questions.   R. Pt remains safe with 15 minute checks. Will continue POC.

## 2024-01-01 NOTE — Group Note (Signed)
 Date:  01/01/2024 Time:  10:10 AM  Group Topic/Focus:  Goals Group:   The focus of this group is to help patients establish daily goals to achieve during treatment and discuss how the patient can incorporate goal setting into their daily lives to aide in recovery. Dimensions of Wellness:   The focus of this group is to introduce the topic of wellness and discuss the role each dimension of wellness plays in total health.  Participation Level:  Active  Participation Quality:  Attentive  Affect:  Appropriate  Cognitive:  Appropriate  Insight: Appropriate  Engagement in Group:  Engaged  Modes of Intervention:  Activity  Additional Comments:    Ulices Maack R Shirlyn Savin 01/01/2024, 10:10 AM

## 2024-01-01 NOTE — [facility] Group Notes (Signed)
 [facility] Group Notes:  (Nursing/MHT/Case Management/Adjunct)  Date:  01/01/2024  Time:  2000  Type of Therapy:  Wrap up group  Participation Level:  Active  Participation Quality:  Appropriate, Attentive, Sharing, and Supportive  Affect:  Appropriate  Cognitive:  Alert  Insight:  Improving  Engagement in Group:  Engaged  Modes of Intervention:  Clarification, Education, and Socialization  Summary of Progress/Problems: Positive thinking and self-care were discussed.   Lenora Shaver S 01/01/2024, 9:11 PM

## 2024-01-01 NOTE — Group Note (Signed)
 LCSW Group Therapy Note    Group Date: 12/31/2023 Start Time: 0100 End Time: 0200   Type of Therapy and Topic: Group Therapy: Body Image  Participation Level:  Active  Description of Group:  Patients were educated about body image and asked to think about whether they have a healthy or unhealthy body image. Patients were led in a discussion about factors that contribute to body image, both internal and external. Patients were asked to discuss strengths of the human body outside of appearance, such as being able to fight off diseases and provide stress relief. Lastly, patients were asked to identify one way in which they appreciate their own body outside of appearance.   Therapeutic Goals:   1. Patient will differentiate between a healthy and unhealthy body image. 2. Patient will identify what contributes to body image 3. Patient will discuss the strengths of the human body. 4. Patient will identify a positive attribute of their body outside of physical appearance.  Summary of Patient Progress:  The patient actively engaged in processing and exploring how they are affected by body image. Patient proved open to input from peers and feedback from CSW. Patient demonstrated great insight into the subject matter, was respectful and supportive of peers, and participated throughout the entire session.  Therapeutic Modalities: Cognitive Behavioral Therapy; Solution-Focused Therapy  [name] [name], LCSWA 01/01/2024  1:29 PM

## 2024-01-01 NOTE — Progress Notes (Incomplete)
(  Sleep Hours) - (Any PRNs that were needed, meds refused, or side effects to meds)- trazodone  (Any disturbances and when (visitation, over night)- none (Concerns raised by the patient)- none (SI/HI/AVH)- denies all

## 2024-01-01 NOTE — Group Note (Signed)
 Date:  01/01/2024 Time:  10:31 AM  Group Topic/Focus:  Emotional Education:   The focus of this group is to discuss what feelings/emotions are, and how they are experienced.    Participation Level:  Active  Participation Quality:  Attentive  Affect:  Appropriate  Cognitive:  Appropriate  Insight: Appropriate  Engagement in Group:  Engaged  Modes of Intervention:  Activity and Education  Additional Comments:    Shamiyah Ngu R Bryston Colocho 01/01/2024, 10:31 AM

## 2024-01-01 NOTE — Plan of Care (Signed)
   Problem: Education: Goal: Emotional status will improve Outcome: Progressing Goal: Mental status will improve Outcome: Progressing   Problem: Activity: Goal: Interest or engagement in activities will improve Outcome: Progressing

## 2024-01-02 ENCOUNTER — Other Ambulatory Visit: Payer: Self-pay

## 2024-01-02 ENCOUNTER — Other Ambulatory Visit (HOSPITAL_COMMUNITY): Payer: Self-pay

## 2024-01-02 LAB — GLUCOSE, CAPILLARY: Glucose-Capillary: 124 mg/dL — ABNORMAL HIGH (ref 70–99)

## 2024-01-02 MED ORDER — SERTRALINE HCL 100 MG PO TABS
200.0000 mg | ORAL_TABLET | Freq: Every day | ORAL | 0 refills | Status: DC
  Filled 2024-01-02: qty 60, 30d supply, fill #0

## 2024-01-02 MED ORDER — AMLODIPINE BESYLATE 10 MG PO TABS
10.0000 mg | ORAL_TABLET | Freq: Every day | ORAL | 0 refills | Status: AC
  Filled 2024-01-02 – 2024-01-30 (×2): qty 30, 30d supply, fill #0

## 2024-01-02 MED ORDER — INSULIN GLARGINE 100 UNIT/ML SOLOSTAR PEN
5.0000 [IU] | PEN_INJECTOR | Freq: Every day | SUBCUTANEOUS | 0 refills | Status: AC
  Filled 2024-01-02: qty 9, 84d supply, fill #0

## 2024-01-02 MED ORDER — HYDROXYZINE HCL 25 MG PO TABS
25.0000 mg | ORAL_TABLET | Freq: Three times a day (TID) | ORAL | 0 refills | Status: DC | PRN
  Filled 2024-01-02: qty 30, 10d supply, fill #0

## 2024-01-02 MED ORDER — LISINOPRIL 40 MG PO TABS
40.0000 mg | ORAL_TABLET | Freq: Every day | ORAL | 0 refills | Status: AC
  Filled 2024-01-02 – 2024-01-30 (×2): qty 30, 30d supply, fill #0

## 2024-01-02 MED ORDER — GLIMEPIRIDE 1 MG PO TABS
5.0000 mg | ORAL_TABLET | Freq: Every day | ORAL | 0 refills | Status: AC
  Filled 2024-01-02: qty 150, 30d supply, fill #0

## 2024-01-02 MED ORDER — ARIPIPRAZOLE 15 MG PO TABS
15.0000 mg | ORAL_TABLET | Freq: Every day | ORAL | 0 refills | Status: DC
  Filled 2024-01-02: qty 30, 30d supply, fill #0

## 2024-01-02 MED ORDER — BUPROPION HCL ER (XL) 150 MG PO TB24
150.0000 mg | ORAL_TABLET | Freq: Every day | ORAL | 0 refills | Status: DC
  Filled 2024-01-02: qty 30, 30d supply, fill #0

## 2024-01-02 MED ORDER — ATORVASTATIN CALCIUM 40 MG PO TABS
40.0000 mg | ORAL_TABLET | Freq: Every day | ORAL | 0 refills | Status: AC
  Filled 2024-01-02 – 2024-01-30 (×2): qty 30, 30d supply, fill #0

## 2024-01-02 MED ORDER — PANTOPRAZOLE SODIUM 20 MG PO TBEC
20.0000 mg | DELAYED_RELEASE_TABLET | Freq: Every day | ORAL | 0 refills | Status: AC
  Filled 2024-01-02: qty 30, 30d supply, fill #0

## 2024-01-02 MED ORDER — TRAZODONE HCL 50 MG PO TABS
50.0000 mg | ORAL_TABLET | Freq: Every evening | ORAL | 0 refills | Status: DC | PRN
  Filled 2024-01-02: qty 15, 15d supply, fill #0

## 2024-01-02 MED ORDER — METFORMIN HCL 1000 MG PO TABS
1000.0000 mg | ORAL_TABLET | Freq: Two times a day (BID) | ORAL | 0 refills | Status: AC
  Filled 2024-01-02 – 2024-01-30 (×2): qty 60, 30d supply, fill #0

## 2024-01-02 MED ORDER — PRAZOSIN HCL 1 MG PO CAPS
1.0000 mg | ORAL_CAPSULE | Freq: Every day | ORAL | 0 refills | Status: DC
  Filled 2024-01-02: qty 30, 30d supply, fill #0

## 2024-01-02 NOTE — Progress Notes (Addendum)
  [facility] Adult Case Management Discharge Plan :  Will you be returning to the same living situation after discharge:  No. Patient will discharge to [facility] with bus passes.  At discharge, do you have transportation home?: No. CSW will arrange taxi voucher through [place] [employer] at 12 pm.  Do you have the ability to pay for your medications: Yes,  patient has active health insurance.  Release of information consent forms completed and in the chart;  Patient's signature needed at discharge.  Patient to Follow up at:  Follow-up Information     [facility] Follow up on 01/11/2024.   Specialty: Behavioral Health Why: You have an appointment on 01/11/24 at 2:30 pm for medication management services.  You also have an appointment for therapy services on 01/12/24 at 11:00 am. Contact information: [address] [name]  [phone]                Next level of care provider has access to [facility] Link:no  Safety Planning and Suicide Prevention discussed: Yes,  completed with Recardo Boers (friend) [phone]     Has patient been referred to the Quitline?: Patient does not use tobacco/nicotine products  Patient has been referred for addiction treatment: No known substance use disorder.  Louetta Lame, LCSWA 01/02/2024, 9:03 AM

## 2024-01-02 NOTE — Transportation (Signed)
 01/02/2024  [name] DOB: [birth year] MRN: [id number]   RIDER WAIVER AND RELEASE OF LIABILITY  For the purposes of helping with transportation needs, [facility] partners with outside transportation providers (taxi companies, Ivan, Catering manager.) to give [place] patients or other approved people the choice of on-demand rides Public librarian) to our buildings for non-emergency visits.  By using [employer], I, the person signing this document, on behalf of myself and/or any legal minors (in my care using the [employer]), agree:  Science writer given to me are supplied by independent, outside transportation providers who do not work for, or have any affiliation with, [employer]. [place] is not a transportation company. [place] has no control over the quality or safety of the rides I get using [employer]. [place] has no control over whether any outside ride will happen on time or not. [place] gives no guarantee on the reliability, quality, safety, or availability on any rides, or that no mistakes will happen. I know and accept that traveling by vehicle (car, truck, SVU, fleeta, bus, taxi, etc.) has risks of serious injuries such as disability, being paralyzed, and death. I know and agree the risk of using [employer] is mine alone, and not [employer]. [employer] are provided as is and as are available. The transportation providers are in charge for all inspections and care of the vehicles used to provide these rides. I agree not to take legal action against [place], its agents, employees, officers, directors, representatives, insurers, attorneys, assigns, successors, subsidiaries, and affiliates at any time for any reasons related directly or indirectly to using [employer]. I also agree not to take legal action against [place] or its affiliates for any injury, death, or damage to property caused by or related to using  [employer]. I have read this Waiver and Release of Liability, and I understand the terms used in it and their legal meaning. This Waiver is freely and voluntarily given with the understanding that my right (or any legal minors) to legal action against  relating to [employer] is knowingly given up to use these services.   I attest that I read the Ride Waiver and Release of Liability to [name], gave [name] the opportunity to ask questions and answered the questions asked (if any). I affirm that [name] then provided consent for assistance with transportation.

## 2024-01-02 NOTE — Group Note (Signed)
 Date:  01/02/2024 Time:  9:57 AM  Group Topic/Focus:  Goals Group:   The focus of this group is to help patients establish daily goals to achieve during treatment and discuss how the patient can incorporate goal setting into their daily lives to aide in recovery.    Participation Level:  Active  Participation Quality:  Appropriate  Affect:  Appropriate  Cognitive:  Appropriate  Insight: Appropriate  Engagement in Group:  Engaged  Modes of Intervention:  Discussion  Additional Comments:    Dalyla Chui R Zamariyah Furukawa 01/02/2024, 9:57 AM

## 2024-01-02 NOTE — Group Note (Signed)
 Recreation Therapy Group Note   Group Topic:Stress Management  Group Date: 01/02/2024 Start Time: 0930 End Time: 0950 Facilitators: Amyri Frenz-McCall, LRT,CTRS Location: [address] Dayroom   Group Topic: Stress Management   Goal Area(s) Addresses:  Patient will actively participate in stress management techniques presented during session.  Patient will successfully identify benefit of practicing stress management post d/c.   Behavioral Response: Engaged  Intervention: Relaxation exercise with ambient sound and script   Activity: Guided Imagery. LRT provided education, instruction, and demonstration on practice of visualization via guided imagery. Patient was asked to participate in the technique introduced during session. LRT debriefed including topics of mindfulness, stress management and specific scenarios each patient could use these techniques. Patients were given suggestions of ways to access scripts post d/c and encouraged to explore Youtube and other apps available on smartphones, tablets, and computers.  Education:  Stress Management, Discharge Planning.   Education Outcome: Acknowledges education   Affect/Mood: Appropriate   Participation Level: Engaged   Participation Quality: Independent   Behavior: Appropriate   Speech/Thought Process: Focused   Insight: Good   Judgement: Good   Modes of Intervention: Script   Patient Response to Interventions:  Engaged   Education Outcome:  In group clarification offered    Clinical Observations/Individualized Feedback: Patient actively engaged in technique introduced, expressed no concerns and demonstrated ability to practice skill independently post d/c.    Plan: Continue to engage patient in RT group sessions 2-3x/week.   Henley Blyth-McCall, LRT,CTRS  01/02/2024 12:02 PM

## 2024-01-02 NOTE — [facility] Suicide Risk Assessment (Signed)
 [facility] INPATIENT:  Family/Significant Other Suicide Prevention Education  Suicide Prevention Education:  Education Completed; [name] (friend) [phone],  (name of family member/significant other) has been identified by the patient as the family member/significant other with whom the patient will be residing, and identified as the person(s) who will aid the patient in the event of a mental health crisis (suicidal ideations/suicide attempt).  With written consent from the patient, the family member/significant other has been provided the following suicide prevention education, prior to the and/or following the discharge of the patient.  The suicide prevention education provided includes the following: Suicide risk factors Suicide prevention and interventions National Suicide Hotline telephone number [facility] assessment telephone number [facility] Emergency Assistance 911 [facility] and/or Residential Mobile Crisis Unit telephone number  Request made of family/significant other to: Remove weapons (e.g., guns, rifles, knives), all items previously/currently identified as safety concern.   Remove drugs/medications (over-the-counter, prescriptions, illicit drugs), all items previously/currently identified as a safety concern.  Dustin has guns in the home and agrees to lock them up safely away from patient's access. Dustin agrees to allow [name] to reside with her temporarily at her home located at [address] [place], [place] [address]. Dustin is aware of who to contact in the event of a mental health crisis.   The family member/significant other verbalizes understanding of the suicide prevention education information provided.  The family member/significant other agrees to remove the items of safety concern listed above.  [name] 01/02/2024, 9:16 AM

## 2024-01-02 NOTE — Progress Notes (Signed)
 Upon discharging, Nurse went over AVS educating on medications and follow up appointments. Suicide safety plan was completed. Reviewed with nurse and copy given to patient and placed in chart. Belongings sheet was signed and items returned to patient. Patient denies SI/HI (with no plan) and AVH. Voices no acute concerns to staff prior discharging off unit. Was safely walked out ride.
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
## 2024-01-02 NOTE — Progress Notes (Signed)
(  Sleep Hours) -7.5  (Any PRNs that were needed, meds refused, or side effects to meds)- Trazodone  50mg  for sleep and Vistaril  25 mg PO for anxiety.  (Any disturbances and when (visitation, over night)- none  (Concerns raised by the patient)- none  (SI/HI/AVH)- Denies

## 2024-01-02 NOTE — Plan of Care (Signed)
  Problem: Education: Goal: Knowledge of [place] [facility] Education information/materials will improve Outcome: Adequate for Discharge Goal: Emotional status will improve Outcome: Adequate for Discharge Goal: Mental status will improve Outcome: Adequate for Discharge Goal: Verbalization of understanding the information provided will improve Outcome: Adequate for Discharge   Problem: Activity: Goal: Interest or engagement in activities will improve Outcome: Adequate for Discharge Goal: Sleeping patterns will improve Outcome: Adequate for Discharge   Problem: Coping: Goal: Ability to verbalize frustrations and anger appropriately will improve Outcome: Adequate for Discharge Goal: Ability to demonstrate self-control will improve Outcome: Adequate for Discharge   

## 2024-01-02 NOTE — Discharge Summary (Signed)
 Physician Discharge Summary Note  Patient:  [name] is an 63 y.o., male MRN:  [id number] DOB:  [birth year] Patient phone:  [phone] (home)  Patient address:   [place] [address],  Total Time spent with patient: 20 minutes  Date of Admission:  12/28/2023 Date of Discharge: 01/02/24  Reason for Admission:  per 10/2 History of Present Illness: Mr. Dontrelle Mazon is a 63 year old single male with a longstanding history of recurrent major depressive disorder (MDD), generalized anxiety disorder (GAD), and post-traumatic stress disorder (PTSD), and multiple psychiatric hospitalizations within the past year who initially presented with a resurgence of suicidal ideation in the context of ongoing homelessness and hopelessness.    He was recently admitted to this facility and discharged on 9/26 with the plan for him to stay with a friend for an extended period of time. However, he reports that after discharge he called his friend and discovered that she was actually away at the beach. He reports experiencing severe depressed mood with hopelessness and suicidal ideation without a specific intent. He states that he tried to manage for a couple days but I went to a really dark place, reviewed my safety plan, and called [phone].   He reports that he is feeling better compared to initial presentation in the ED but remains depressed and worried over his chronic homelessness.   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 Prior to that the last suicide attempt was in 1993. Prior Medications - Wellbutrin , Cymbalta, risperidone, quetiapine , lithium, selegiline, TCAs Treatments - previously in therapy, but not currently due to financial constraints  Past Medical History:  Past Medical History:  Diagnosis Date   Cancer (HCC)    melanoma;  right ear   Depression    Diabetes mellitus    GERD (gastroesophageal reflux disease)    Hyperlipidemia    Hypertension    Metabolic syndrome    Obesity    PONV (postoperative nausea and vomiting)    YRS AGO WITHER ETHER    Past Surgical History:  Procedure Laterality Date   COLONOSCOPY  05/01/2013   CYST REMOVAL TRUNK  1989   benign   MELANOMA EXCISION  2008   right ear and neck   ORIF HUMERUS FRACTURE Left 12/23/2020   Procedure: OPEN REDUCTION INTERNAL FIXATION (ORIF) PROXIMAL HUMERUS FRACTURE;  Surgeon: [name], MD;  Location: [facility];  Service: Orthopedics;  Laterality: Left;    POLYPECTOMY     reconstructive plastic surgery face  1967   dog bite at age 68; about 9 surgeries for 10 years   TONSILLECTOMY  1966   age 67   Family History:  Family History  Problem Relation Age of Onset   Arthritis Mother    Stroke Father 7       heavy smoker 4-5 packs a day unfiltered   Schizophrenia Father        homeless and did not get medical care in time  Alcoholism Father    Colon cancer Neg Hx    Esophageal cancer Neg Hx    Rectal cancer Neg Hx    Stomach cancer Neg Hx    Colon polyps Neg Hx    Family Psychiatric  History:  Father: Schizophrenia, depression, anxiety, alcoholism, suicide (homeless, untreated) Mother: Depression, arthritis Maternal grandmother and paternal grandfather: Suicide  Social History:  Social History   Substance and Sexual Activity  Alcohol   Use Yes   Alcohol /week: 5.0 standard drinks of alcohol    Types: 2 Glasses of wine, 3 Cans of beer per week   Comment: SOCIAL 3 TIMES PER WEEK 2 DRINKS AT A TIME     Social History   Substance and Sexual Activity  Drug Use Not Currently   Types: Marijuana   Comment: HX OF MONTHS AGO    Social History   Socioeconomic History   Marital status: Single    Spouse name: Not on file   Number of children: Not on file   Years of education: Not on file   Highest education level: Bachelor's degree (e.g., BA, AB, BS)  Occupational History   Not on file  Tobacco Use   Smoking status: Former    Current packs/day: 0.00    Average packs/day: 0.5 packs/day for 5.0 years (2.5 ttl pk-yrs)    Types: Cigarettes    Start date: 05/20/1983    Quit date: 05/19/1988    Years since quitting: 35.6   Smokeless tobacco: Never   Tobacco comments:    quit in 1990  Vaping Use   Vaping status: Never Used  Substance and Sexual Activity   Alcohol  use: Yes    Alcohol /week: 5.0 standard drinks of alcohol     Types: 2 Glasses of wine, 3 Cans of beer per week    Comment: SOCIAL 3 TIMES PER WEEK 2 DRINKS AT A TIME   Drug use: Not Currently    Types: Marijuana    Comment: HX OF MONTHS AGO   Sexual activity: Never  Other Topics Concern   Not on file  Social History Narrative   Single. 1 dog, 2 cats. Lives alone.       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 Enjoys dinner parties.    Social Drivers of Health   Financial Resource Strain: High Risk (08/09/2023)   Overall Financial Resource Strain (CARDIA)    Difficulty of Paying Living Expenses: Hard  Food Insecurity: Food Insecurity Present (12/28/2023)   Hunger Vital Sign    Worried About Running Out of Food in the  Last Year: Sometimes true    Ran Out of Food in the Last Year: Sometimes true  Transportation Needs: Unmet Transportation Needs (12/28/2023)   PRAPARE - Administrator, Civil Service (Medical): Yes    Lack of Transportation (Non-Medical): Yes  Physical Activity: Sufficiently Active (08/09/2023)   Exercise Vital Sign    Days of Exercise per Week: 7 days    Minutes of Exercise per Session: 30 min  Stress: Stress Concern Present (08/09/2023)   [employer] of Occupational Health - Occupational Stress Questionnaire    Feeling of Stress : To some extent  Social Connections: Patient Declined (12/18/2023)   Social Connection and Isolation Panel  Frequency of Communication with Friends and Family: Patient declined    Frequency of Social Gatherings with Friends and Family: Patient declined    Attends Religious Services: Patient declined    Database administrator or Organizations: Patient declined    Attends Banker Meetings: Patient declined    Marital Status: Patient declined    Hospital Course:  During the course of patient's hospitalization, the 15-minute checks were adequate to ensure patient's safety. Patient did not exhibit erratic or aggressive behavior and was compliant with scheduled medication. Patient was recommended for outpatient psychiatry follow-up.  At the time of discharge patient is not reporting any acute suicidal/homicidal ideations/AVH, delusional thoughts or paranoia. Patient did not appear to be responding to any internal stimuli. Patient feels more confident about self-care & in managing their mental health problems. Patient currently denies any new issues or concerns. Education and supportive counseling provided throughout patient's hospital stay & upon discharge.   Today upon discharge evaluation, the patient gives a mood of appropriate to circumstances. Patient denies any specific concerns and has no new physical complaints. Patient slept well,  appetite good, regular bowel movements. Patient feels that the medications have been helpful & is in agreement to continue current treatment regimen as recommended. Patient was able to engage in safety planning including plan to return to [facility] inpatient unit, the nearest emergency room or contact emergency services if patient feels unable to maintain their own safety or the safety of others. Patient had no further questions, comments, or concerns. Patient left [facility] inpatient unit with all personal belongings in no apparent distress. Transportation per safe transport to home was arranged for patient.  Physical Findings: AIMS:  , ,  ,  ,  ,  ,   CIWA:    COWS:     Musculoskeletal: Strength & Muscle Tone: within normal limits Gait & Station: normal Patient leans: N/A   Psychiatric Specialty Exam:  Presentation  General Appearance:  Appropriate for Environment  Eye Contact: Good  Speech: Normal Rate  Speech Volume: Normal  Handedness: Right   Mood and Affect  Mood: Euthymic  Affect: Congruent   Thought Process  Thought Processes: Linear  Descriptions of Associations:Intact  Orientation:Full (Time, Place and Person)  Thought Content:Logical  History of Schizophrenia/Schizoaffective disorder:No  Duration of Psychotic Symptoms:No data recorded Hallucinations:Hallucinations: None  Ideas of Reference:None  Suicidal Thoughts:Suicidal Thoughts: No  Homicidal Thoughts:Homicidal Thoughts: No   Sensorium  Memory: Immediate Good; Recent Fair; Remote Fair  Judgment: Fair  Insight: Fair   Art therapist  Concentration: Good  Attention Span: Good  Recall: Good  Fund of Knowledge: Good  Language: Good   Psychomotor Activity  Psychomotor Activity: Psychomotor Activity: Normal   Assets  Assets: Communication Skills; Leisure Time   Sleep  Sleep: Sleep: Fair  Estimated Sleeping Duration  (Last 24 Hours): 6.50-7.50 hours   Physical Exam: Physical Exam Vitals and nursing note reviewed.  Constitutional:      Appearance: Normal appearance.  HENT:     Head: Normocephalic.  Eyes:     Extraocular Movements: Extraocular movements intact.  Pulmonary:     Effort: Pulmonary effort is normal.  Musculoskeletal:        General: Normal range of motion.  Neurological:     Mental Status: He is alert and oriented to person, place, and time.     Cranial Nerves: Facial asymmetry present.  Psychiatric:        Mood and Affect: Mood normal.  Behavior: Behavior normal.    Review of Systems  Constitutional:  Negative for chills and fever.  Respiratory:  Negative for cough and shortness of breath.   Cardiovascular:  Negative for chest pain.  Gastrointestinal:  Negative for constipation, diarrhea, nausea and vomiting.  Genitourinary:  Negative for dysuria.  Musculoskeletal:  Positive for joint pain.  Skin:  Negative for rash.  Neurological:  Negative for dizziness.  Psychiatric/Behavioral:  Negative for hallucinations and suicidal ideas.    Blood pressure (!) 141/68, pulse (!) 59, temperature 98.1 F (36.7 C), temperature source Oral, resp. rate 16, height 5' 8 (1.727 m), weight 109.8 kg, SpO2 96%. Body mass index is 36.8 kg/m.   Social History   Tobacco Use  Smoking Status Former   Current packs/day: 0.00   Average packs/day: 0.5 packs/day for 5.0 years (2.5 ttl pk-yrs)   Types: Cigarettes   Start date: 05/20/1983   Quit date: 05/19/1988   Years since quitting: 35.6  Smokeless Tobacco Never  Tobacco Comments   quit in 1990   Tobacco Cessation:  N/A, patient does not currently use tobacco products   Blood Alcohol  level:  Lab Results  Component Value Date   [facility] <15 12/27/2023   ETH <15 12/17/2023    Metabolic Disorder Labs:  Lab Results  Component Value Date   HGBA1C 9.9 (H) 12/10/2023   MPG 237.43 12/10/2023   MPG 258 11/06/2023   No results found  for: PROLACTIN Lab Results  Component Value Date   CHOL 279 (H) 11/06/2023   TRIG 282 (H) 11/06/2023   HDL 60 11/06/2023   CHOLHDL 4.7 11/06/2023   VLDL 56 (H) 11/06/2023   LDLCALC 163 (H) 11/06/2023   LDLCALC 57 08/25/2023    See Psychiatric Specialty Exam and Suicide Risk Assessment completed by Attending Physician prior to discharge.  Discharge destination:  Other:  to the IRC/car, and then to his friend's home  Is patient on multiple antipsychotic therapies at discharge:  No   Has Patient had three or more failed trials of antipsychotic monotherapy by history:  No  Recommended Plan for Multiple Antipsychotic Therapies: NA  Discharge Instructions     Diet - low sodium heart healthy   Complete by: As directed    Diet Carb Modified   Complete by: As directed    Discharge instructions   Complete by: As directed    Prescriptions for new medications provided for the patient to bridge to follow up appointment. The patient was informed that refills for these prescriptions are generally not provided, and patient is encouraged to attend all follow up appointments to address medication refills and adjustments.   Today's discharge was reviewed with treatment team, and the team is in agreement that the patient is ready for discharge. The patient is was of the discharge plan for today and has been given opportunity to ask questions. At time of discharge, the patient does not vocalize any acute harm to self or others, is goal directed, able to advocate for self and organizational baseline.   At discharge, the patient is instructed to:  Take all medications as prescribed. Report any adverse effects and or reactions from the medicines to her outpatient provider promptly.  Do not engage in alcohol  and/or illegal drug use while on prescription medicines.  In the event of worsening symptoms, patient is instructed to call the crisis hotline, 911 and or go to the nearest ED for appropriate  evaluation and treatment of symptoms.  Follow-up with primary care provider for  further care of medical issues, concerns and or health care needs. * Substance abuse follow up: it is recommended that you follow up with community support treatment, like AA/NA. It is also recommended that the patient attend 90 meetings in 90 days, otherwise known as 90 in 90 *antipsychotic or neuroleptic medication: this medication requires metabolic monitoring, which may include labs for cholesterol, blood sugar and other markers, as well as monitoring EKG and weight.   Increase activity slowly   Complete by: As directed       Allergies as of 01/02/2024       Reactions   Other Other (See Comments)   Pollen - dries out right eye        Medication List     TAKE these medications      Indication  amLODipine  10 MG tablet Commonly known as: NORVASC  Take 1 tablet (10 mg total) by mouth daily. Start taking on: January 03, 2024  Indication: High Blood Pressure   ARIPiprazole  15 MG tablet Commonly known as: ABILIFY  Take 1 tablet (15 mg total) by mouth at bedtime.  Indication: Major Depressive Disorder   atorvastatin  40 MG tablet Commonly known as: LIPITOR Take 1 tablet (40 mg total) by mouth daily. Start taking on: January 03, 2024  Indication: High Amount of Fats in the Blood   buPROPion  150 MG 24 hr tablet Commonly known as: WELLBUTRIN  XL Take 1 tablet (150 mg total) by mouth daily. Start taking on: January 03, 2024  Indication: Depression   glimepiride 1 MG tablet Commonly known as: AMARYL Take 5 tablets (5 mg total) by mouth daily with breakfast. Start taking on: January 03, 2024  Indication: Type 2 Diabetes   hydrOXYzine  25 MG tablet Commonly known as: ATARAX  Take 1 tablet (25 mg total) by mouth 3 (three) times daily as needed for anxiety.  Indication: Feeling Anxious   insulin  glargine 100 UNIT/ML injection Commonly known as: LANTUS Inject 0.05 mLs (5 Units total) into the skin  daily at 10 pm.  Indication: Type 2 Diabetes   lisinopril  40 MG tablet Commonly known as: ZESTRIL  Take 1 tablet (40 mg total) by mouth daily. Start taking on: January 03, 2024  Indication: High Blood Pressure   metFORMIN  1000 MG tablet Commonly known as: GLUCOPHAGE  Take 1 tablet (1,000 mg total) by mouth 2 (two) times daily with a meal.  Indication: Type 2 Diabetes   pantoprazole  20 MG tablet Commonly known as: PROTONIX  Take 1 tablet (20 mg total) by mouth daily. Start taking on: January 03, 2024  Indication: Gastroesophageal Reflux Disease   prazosin  1 MG capsule Commonly known as: MINIPRESS  Take 1 capsule (1 mg total) by mouth at bedtime.  Indication: High Blood Pressure, Frightening Dreams   sertraline  100 MG tablet Commonly known as: ZOLOFT  Take 2 tablets (200 mg total) by mouth daily. Start taking on: January 03, 2024  Indication: Major Depressive Disorder   traZODone  50 MG tablet Commonly known as: DESYREL  Take 1 tablet (50 mg total) by mouth at bedtime as needed for sleep.  Indication: Trouble Sleeping        Follow-up Information     [facility] Follow up on 01/11/2024.   Specialty: Behavioral Health Why: You have an appointment on 01/11/24 at 2:30 pm for medication management services.  You also have an appointment for therapy services on 01/12/24 at 11:00 am. Contact information: [address] [place] [address] [place]   [address] [phone]  Follow-up recommendations:  Activity:  as tolerated Diet:  low sodium, low fat, consistent carbohydrate (diabetic)  Comments:  Prescriptions for new medications provided for the patient to bridge to follow up appointment. The patient was informed that refills for these prescriptions are generally not provided, and patient is encouraged to attend all follow up appointments to address medication refills and adjustments.   Today's discharge was  reviewed with treatment team, and the team is in agreement that the patient is ready for discharge. The patient is was of the discharge plan for today and has been given opportunity to ask questions. At time of discharge, the patient does not vocalize any acute harm to self or others, is goal directed, able to advocate for self and organizational baseline.   At discharge, the patient is instructed to:  Take all medications as prescribed. Report any adverse effects and or reactions from the medicines to her outpatient provider promptly.  Do not engage in alcohol  and/or illegal drug use while on prescription medicines.  In the event of worsening symptoms, patient is instructed to call the crisis hotline, 911 and or go to the nearest ED for appropriate evaluation and treatment of symptoms.  Follow-up with primary care provider for further care of medical issues, concerns and or health care needs. * Substance abuse follow up: it is recommended that you follow up with community support treatment, like AA/NA. It is also recommended that the patient attend 90 meetings in 90 days, otherwise known as 90 in 90 *antipsychotic or neuroleptic medication: this medication requires metabolic monitoring, which may include labs for cholesterol, blood sugar and other markers, as well as monitoring EKG and weight.  Signed: Corean Anette Potters, MD 01/02/2024, 1:19 PM
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
## 2024-01-03 ENCOUNTER — Other Ambulatory Visit (HOSPITAL_COMMUNITY): Payer: Self-pay

## 2024-01-03 ENCOUNTER — Other Ambulatory Visit: Payer: Self-pay

## 2024-01-11 ENCOUNTER — Other Ambulatory Visit: Payer: Self-pay

## 2024-01-11 ENCOUNTER — Ambulatory Visit (HOSPITAL_COMMUNITY)

## 2024-01-11 VITALS — BP 136/78 | HR 65 | Ht 68.0 in | Wt 242.0 lb

## 2024-01-11 DIAGNOSIS — F411 Generalized anxiety disorder: Secondary | ICD-10-CM | POA: Diagnosis not present

## 2024-01-11 DIAGNOSIS — F4312 Post-traumatic stress disorder, chronic: Secondary | ICD-10-CM | POA: Diagnosis not present

## 2024-01-11 DIAGNOSIS — F332 Major depressive disorder, recurrent severe without psychotic features: Secondary | ICD-10-CM

## 2024-01-11 MED ORDER — TRAZODONE HCL 50 MG PO TABS
50.0000 mg | ORAL_TABLET | Freq: Every evening | ORAL | 1 refills | Status: DC | PRN
  Filled 2024-01-11: qty 15, 15d supply, fill #0
  Filled 2024-01-28: qty 15, 15d supply, fill #1

## 2024-01-11 MED ORDER — SERTRALINE HCL 100 MG PO TABS
200.0000 mg | ORAL_TABLET | Freq: Every day | ORAL | 0 refills | Status: AC
Start: 2024-01-11 — End: ?
  Filled 2024-01-11 – 2024-01-28 (×2): qty 60, 30d supply, fill #0

## 2024-01-11 MED ORDER — BUPROPION HCL ER (XL) 150 MG PO TB24
150.0000 mg | ORAL_TABLET | Freq: Every day | ORAL | 0 refills | Status: AC
Start: 2024-01-11 — End: ?
  Filled 2024-01-11 – 2024-01-28 (×2): qty 30, 30d supply, fill #0

## 2024-01-11 MED ORDER — ARIPIPRAZOLE 15 MG PO TABS
15.0000 mg | ORAL_TABLET | Freq: Every day | ORAL | 0 refills | Status: DC
  Filled 2024-01-11: qty 30, 30d supply, fill #0

## 2024-01-11 MED ORDER — HYDROXYZINE HCL 25 MG PO TABS
25.0000 mg | ORAL_TABLET | Freq: Three times a day (TID) | ORAL | 0 refills | Status: DC | PRN
Start: 2024-01-11 — End: 2024-01-30
  Filled 2024-01-11: qty 30, 10d supply, fill #0

## 2024-01-11 MED ORDER — PRAZOSIN HCL 1 MG PO CAPS
1.0000 mg | ORAL_CAPSULE | Freq: Every day | ORAL | 0 refills | Status: DC
  Filled 2024-01-11: qty 30, 30d supply, fill #0

## 2024-01-11 NOTE — Addendum Note (Signed)
 Addended by: MERCY DOMINO A on: 01/11/2024 05:35 PM   Modules accepted: Level of Service

## 2024-01-12 ENCOUNTER — Ambulatory Visit (HOSPITAL_COMMUNITY): Admitting: Licensed Clinical Social Worker

## 2024-01-12 DIAGNOSIS — F411 Generalized anxiety disorder: Secondary | ICD-10-CM | POA: Diagnosis not present

## 2024-01-12 DIAGNOSIS — F4312 Post-traumatic stress disorder, chronic: Secondary | ICD-10-CM

## 2024-01-12 DIAGNOSIS — F332 Major depressive disorder, recurrent severe without psychotic features: Secondary | ICD-10-CM

## 2024-01-12 NOTE — Progress Notes (Signed)
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 R ear.  12/16/2006   Major depression, recurrent, full remission 12/16/2006   Hyperlipidemia associated with type 2 diabetes mellitus (HCC) 11/07/2006    Patient Centered Plan: Patient is on the following Treatment Plan(s): Will develop tx plan to support mgmt of Anxiety, Depression, and Post Traumatic Stress Disorder at next scheduled visit.   Referrals to Alternative Service(s): Referred to Alternative Service(s):  PHP Place:  [facility] OPT Date:  01/12/24 Time:  1210   Referred to Alternative Service(s):   Place:   Date:   Time:    Referred to Alternative Service(s):   Place:   Date:   Time:    Referred to Alternative Service(s):   Place:   Date:   Time:      Collaboration of Care: Psychiatrist AEB provider documentation available in EHR.  Patient/Guardian was advised Release of Information must be obtained prior to any record release in order to collaborate their care with an outside provider. Patient/Guardian was advised if they have not already done so to contact the registration department to sign all necessary forms in order  for us  to release information regarding their care.   Consent: Patient/Guardian gives verbal consent for treatment and assignment of benefits for services provided during this visit. Patient/Guardian expressed understanding and agreed to proceed.   [place] Dustin Maris, LCSW

## 2024-01-13 ENCOUNTER — Telehealth (HOSPITAL_COMMUNITY): Payer: Self-pay | Admitting: Licensed Clinical Social Worker

## 2024-01-16 ENCOUNTER — Telehealth (HOSPITAL_COMMUNITY): Payer: Self-pay | Admitting: Licensed Clinical Social Worker

## 2024-01-16 NOTE — Telephone Encounter (Signed)
 Cln made 2nd attempt to f/u with pt re: PHP referral per DOROTHA Maris. Cln able to reach pt and provided information about PHP. Pt reports interest in the program and willingness/ability to present in person M-F 9-2 for 2-3 weeks.  Cln offered intake appointment for 10/22 at 10am. Cln educated pt on how to join the virtual appt through his MyChart and pt reports understanding.

## 2024-01-17 ENCOUNTER — Other Ambulatory Visit: Payer: Self-pay

## 2024-01-18 ENCOUNTER — Ambulatory Visit (HOSPITAL_COMMUNITY): Admitting: Professional

## 2024-01-18 DIAGNOSIS — F4312 Post-traumatic stress disorder, chronic: Secondary | ICD-10-CM

## 2024-01-18 DIAGNOSIS — F332 Major depressive disorder, recurrent severe without psychotic features: Secondary | ICD-10-CM

## 2024-01-18 NOTE — Progress Notes (Signed)
   01/18/24 1020  Patient-Centered Profile  PCP Completed On 01/18/24  Plan Meeting Date 01/18/24  Effective Date 01/23/24  What People Like and [place] About? Advocate for own mental health; resilient  What's Important to? getting help with mental health symptoms  How Best to Support? unsure- inpatient admissions are not helping long-term  Add What's Working / [facility] Not Working? Inpatient admissions and on/off therapy/med man is not working; needs consistent support and treatment  PCP Action Plan  Long Range Outcome Pt will report an increased ability to manage depression symptoms with use of coping skills at least 3x daily. Pt will report decrease in passive SI to 0x a week. Pt will report increased ability to catch, challenge, change cognitive distortions at least 3x daily.

## 2024-01-18 NOTE — Psych (Signed)
 Virtual Visit via Video Note  I connected with [name] on 01/18/24 at 10:00 AM EDT by a video enabled telemedicine application and verified that I am speaking with the correct person using two identifiers.  Location: Patient: Set designer (parked) Provider: Clinical Home Office   I discussed the limitations of evaluation and management by telemedicine and the availability of in person appointments. The patient expressed understanding and agreed to proceed.  Follow Up Instructions:    I discussed the assessment and treatment plan with the patient. The patient was provided an opportunity to ask questions and all were answered. The patient agreed with the plan and demonstrated an understanding of the instructions.   The patient was advised to call back or seek an in-person evaluation if the symptoms worsen or if the condition fails to improve as anticipated.  I provided 20 minutes of non-face-to-face time during this encounter.   [name] JINNY Devoid, [facility]  Cln met with [name] to orient to [facility] after CCA completed on 01/12/24 by Lynwood Maris with recommendation of PHP. [name] agrees to move forward with PHP with a start date of 01/23/24. He denies current SI/HI/AVH. He agrees to treatment plan of being in PHP to work on coping, support, and decreased symptoms.

## 2024-01-23 ENCOUNTER — Ambulatory Visit (HOSPITAL_COMMUNITY)

## 2024-01-23 ENCOUNTER — Ambulatory Visit (INDEPENDENT_AMBULATORY_CARE_PROVIDER_SITE_OTHER): Admitting: Professional

## 2024-01-23 DIAGNOSIS — F332 Major depressive disorder, recurrent severe without psychotic features: Secondary | ICD-10-CM | POA: Diagnosis not present

## 2024-01-23 DIAGNOSIS — R4589 Other symptoms and signs involving emotional state: Secondary | ICD-10-CM

## 2024-01-23 DIAGNOSIS — F411 Generalized anxiety disorder: Secondary | ICD-10-CM

## 2024-01-23 DIAGNOSIS — F4312 Post-traumatic stress disorder, chronic: Secondary | ICD-10-CM

## 2024-01-23 NOTE — Psych (Signed)
 [facility] BH PHP THERAPIST PROGRESS NOTE  [name] [id number]   Session Time: 9:00 am - 10:00 am  Participation Level: Active  Behavioral Response: CasualAlertAnxious and Depressed  Type of Therapy: Group Therapy  Treatment Goals addressed: Coping  Progress Towards Goals: Initial  Interventions: CBT, DBT, Solution Focused, Strength-based, Supportive, and Reframing  Therapist Response: Clinician led check-in regarding current stressors and situation, and review of patient completed daily inventory. Clinician utilized active listening and empathetic response and validated patient emotions. Clinician facilitated processing group on pertinent issues.?   Summary: Patient arrived within time allowed. Patient rates their depression at a 2 and anxiety at a 3 on a scale of 1-10 with 10 being best. Pt reports he has had lifelong depression beginning at age 28. He endorses multiple previous suicide attempts, a recent hospitalization, and shares that he is currently experiencing homelessness. When asked about sleep and appetite, pt reports they slept 9 hours last night and ate 2 meals yesterday. Pt denied experiencing SI/SH thoughts and endorses current feelings of hopelessness. Pt able to process.?Pt engaged in discussion.?      Session Time: 10:00 am - 11:00 am  Participation Level: Active  Behavioral Response: CasualAlertAnxious and Depressed  Type of Therapy: Group Therapy  Treatment Goals addressed: Coping  Progress Towards Goals: Initial  Interventions: CBT, DBT, Solution Focused, Strength-based, Supportive, and Reframing  Therapist Response: Clinician led processing group for pt's current struggles. Group members shared stressors and provided support and feedback. Clinician brought in topics of boundaries, specifically that a boundary is a door rather than a wall, to inform discussion.  Summary: Pt able to process and provide support to group.     Session Time: 11:00 am -  12:00 pm  Participation Level: Active  Behavioral Response: CasualAlertAnxious and Depressed  Type of Therapy: Group Therapy  Treatment Goals addressed: Coping  Progress Towards Goals: Initial  Interventions: CBT, DBT, Solution Focused, Strength-based, Supportive, and Reframing  Therapist Response: Clinician led group on social supports. Utilizing a handout on the subject, clinician led discussion surrounding types of supports and asked patients to identify areas of support that are missing for them. The fear of a rejection as a barrier to building one's support system was discussed, and patients viewed a Ted Talk entitled "What I Learned From 100 Days of Rejection" by Jia Jiang. Clinician utilized ACT principles to inform discussion.  Summary: Pt engaged in discussion and demonstrated good insight into the subject matter.   Session Time: 12:00 pm - 12:30 pm  Participation Level: Active  Behavioral Response: CasualAlertAnxious and Depressed  Type of Therapy: Group Therapy  Treatment Goals addressed: Coping  Progress Towards Goals: Initial  Interventions: Psychologist, Occupational, Supportive  Therapist Response: Reflection Group: Patients encouraged to practice skills and interpersonal techniques or work on mindfulness and relaxation techniques. The importance of self-care and making skills part of a routine to increase usage were stressed.  Summary: Patient engaged and participated appropriately.   Session Time: 12:30 pm - 1:30 pm  Participation Level: Active  Behavioral Response: CasualAlertAnxious and Depressed  Type of Therapy: Group Therapy  Treatment Goals addressed: Coping  Progress Towards Goals: Initial  Interventions: CBT, DBT, Solution Focused, Strength-based, Supportive, and Reframing  Therapist Response: Group was led by occupational therapist, Edward Hollan.   Summary: Pt engaged and participated in discussion.   Session Time: 1:30 pm - 2:00  pm  Participation Level: Active  Behavioral Response: CasualAlertAnxious and Depressed  Type of Therapy: Group Therapy  Treatment Goals  addressed: Coping  Progress Towards Goals: Initial  Interventions: CBT, DBT, Solution Focused, Strength-based, Supportive, and Reframing  Therapist Response: 1:30 pm - 1:50 pm: Clinician continued discussion of social supports and rejection. Patients were invited to share thoughts and reactions to [place] Talk. Clinician utilized ACT principles to inform discussion. 1:50 - 2:00 pm: Clinician led check-out. Clinician assessed for immediate needs, medication compliance and efficacy, and safety concerns?  Summary: 1:30 pm - 1:50 pm: Pt participated in discussion. 1:50 - 2:00 pm: At check-out, patient contracts for safety.?Patient demonstrates progress as evidenced by his engagement and by being receptive to treatment. Patient denies SI/HI/self-harm thoughts at the end of group and agrees to seek help should those thoughts/feelings occur.?    Suicidal/Homicidal: Nowithout intent/plan  Plan: ?Pt will continue in PHP and medication management while continuing to work on decreasing depression symptoms,?SI, and anxiety symptoms,?and increasing the ability to self manage symptoms.     Collaboration of Care: Medication Management AEB Staci Kerns, NP  Patient/Guardian was advised Release of Information must be obtained prior to any record release in order to collaborate their care with an outside provider. Patient/Guardian was advised if they have not already done so to contact the registration department to sign all necessary forms in order for us  to release information regarding their care.   Consent: Patient/Guardian gives verbal consent for treatment and assignment of benefits for services provided during this visit. Patient/Guardian expressed understanding and agreed to proceed.   Diagnosis: Major depressive disorder, recurrent, severe without psychotic features  (HCC) [F33.2]    1. Major depressive disorder, recurrent, severe without psychotic features [facility])       Will LILLETTE Pollack, LCSW 01/23/2024

## 2024-01-23 NOTE — Psych (Signed)
 Active     OP Depression     LTG: Reduce frequency, intensity, and duration of depression symptoms so that daily functioning is improved (Initial)     Start:  01/23/24    Expected End:  04/24/24         LTG: Increase coping skills to manage depression and improve ability to perform daily activities (Initial)     Start:  01/23/24    Expected End:  04/24/24         STG: Jayson Bring will attend at least 80% of scheduled PHP sessions    (Initial)     Start:  01/23/24    Expected End:  04/24/24         STG: Jayson Bring will complete at least 80% of assigned homework  (Initial)     Start:  01/23/24    Expected End:  04/24/24         STG: Jayson Bring will identify cognitive patterns and beliefs that support depression (Initial)     Start:  01/23/24    Expected End:  04/24/24         Encourage Jayson Bring to participate in recovery peer support activities weekly      Start:  01/23/24         Work with Jayson Bring to identify the major components of a recent episode of depression: physical symptoms, major thoughts and images, and major behaviors they experienced     Start:  01/23/24         Therapist will educate patient on cognitive distortions and the rationale for treatment of depression     Start:  01/23/24         Therapist will review PLEASE Skills (Treat Physical Illness, Balance Eating, Avoid Mood-Altering Substances, Balance Sleep and Get Exercise) with patient     Start:  01/23/24           [name] verbally agrees to treatment plan.

## 2024-01-24 ENCOUNTER — Ambulatory Visit (HOSPITAL_COMMUNITY)

## 2024-01-24 ENCOUNTER — Encounter (HOSPITAL_COMMUNITY): Payer: Self-pay | Admitting: Professional

## 2024-01-24 ENCOUNTER — Ambulatory Visit (HOSPITAL_COMMUNITY): Admitting: Licensed Clinical Social Worker

## 2024-01-24 ENCOUNTER — Ambulatory Visit (INDEPENDENT_AMBULATORY_CARE_PROVIDER_SITE_OTHER): Admitting: Professional

## 2024-01-24 DIAGNOSIS — F4312 Post-traumatic stress disorder, chronic: Secondary | ICD-10-CM

## 2024-01-24 DIAGNOSIS — F332 Major depressive disorder, recurrent severe without psychotic features: Secondary | ICD-10-CM | POA: Diagnosis not present

## 2024-01-24 DIAGNOSIS — R4589 Other symptoms and signs involving emotional state: Secondary | ICD-10-CM

## 2024-01-24 NOTE — Psych (Incomplete)
 [facility] BH PHP THERAPIST PROGRESS NOTE  [name] [id number]   Session Time: 9:00 am - 10:00 am  Participation Level: Active  Behavioral Response: CasualAlertAnxious and Depressed  Type of Therapy: Group Therapy  Treatment Goals addressed: Coping  Progress Towards Goals: Progressing  Interventions: CBT, DBT, Solution Focused, Strength-based, Supportive, and Reframing  Therapist Response: Clinician led check-in regarding current stressors and situation, and review of patient completed daily inventory. Clinician utilized active listening and empathetic response and validated patient emotions. Clinician facilitated processing group on pertinent issues.?   Summary: Patient arrived within time allowed. Patient rates their depression at a 3 and anxiety at a 3 on a scale of 1-10 with 10 being best. When asked about sleep and appetite, pt reports they slept 4 hours last night and ate 3 meals yesterday. Pt denied experiencing SI/SH thoughts and endorses current feelings of hopelessness. Pt reports he struggled with some of the usual negative thoughts yesterday due to isolation issues at this time. He reports he was able to distract himself by reading and writing. He also did some Door Dash. Pt able to process.?Pt engaged in discussion.?      Session Time: 10:00 am - 11:00 am  Participation Level: Active  Behavioral Response: CasualAlertAnxious and Depressed  Type of Therapy: Group Therapy  Treatment Goals addressed: Coping  Progress Towards Goals: Progressing  Interventions: CBT, DBT, Solution Focused, Strength-based, Supportive, and Reframing  Therapist Response: Clinician led processing group for pt's current struggles. Group members shared stressors and provided support and feedback. Clinician brought in topics of resiliency, mindfulness and grounding techniques, and the power of getting to choose what to share with others.  Summary: Pt able to process and provide support to  group. Pt reports he struggles with feeling a lack of resiliency at this time and discusses creating a new definition for resiliency.    SSession Time: 11:00 am - 12:00 pm   Participation Level: Active   Behavioral Response: Casual Alert and Anxious/Depressed   Type of Therapy: Group Therapy   Treatment Goals addressed: Coping   Progress Towards Goals: Progressing   Interventions: CBT, DBT, Solution Focused, Strength-based, Supportive, and Reframing   Therapist Response: Group was led by [facility] chaplain, Amy [name].   Summary: Pt engaged in discussion.      Session Time: 12:00 pm - 12:30 pm   Participation Level: Active   Behavioral Response: Casual Alert and Anxious/Depressed   Type of Therapy: Group Therapy   Treatment Goals addressed: Coping   Progress Towards Goals: Progressing   Interventions: Psychologist, Occupational, Supportive   Therapist Response: Reflection Group: Patients encouraged to practice skills and interpersonal techniques or work on mindfulness and relaxation techniques. The importance of self-care and making skills part of a routine to increase usage were stressed.   Summary: Pt engaged in discussion.       Session Time: 12:30 pm - 1:30 pm   Participation Level: Active   Behavioral Response: Casual Alert and Anxious/Depressed   Type of Therapy: Group Therapy   Treatment Goals addressed: Coping   Progress Towards Goals: Progressing   Interventions: OT group   Therapist Response: Group was led by occupational therapist, Edward Hollan.    Summary: Pt engaged in discussion.            Session Time: 1:30 pm - 2:00 pm   Participation Level: Active   Behavioral Response: Casual Alert and Anxious/Depressed   Type of Therapy: Group Therapy   Treatment Goals addressed: Coping  Progress Towards Goals: Progressing   Interventions: CBT, DBT, Solution Focused, Strength-based, Supportive, and Reframing   Therapist Response:  1:30-150pm: Cln led group on discussing and practicing grounding techniques including 5-4-3-2-1, categories game, counting backwards, box breathing, and others. 1:50 - 2:00 pm: Clinician led check-out. Clinician assessed for immediate needs, medication compliance and efficacy, and safety concerns?   Summary: 1:30-1:50pm: Pt engaged in activities and discussion. 1:50 pm - 2:00 pm: At check-out, patient reports no immediate concerns. Patient demonstrates progress as evidenced by engagement and responsiveness to treatment. Patient denies SI/HI/self-harm thoughts at the end of group.   Suicidal/Homicidal: Nowithout intent/plan  Plan: ?Pt will continue in PHP and medication management while continuing to work on decreasing depression symptoms,?SI, and anxiety symptoms,?and increasing the ability to self manage symptoms.     Collaboration of Care: Medication Management AEB Staci Kerns, NP  Patient/Guardian was advised Release of Information must be obtained prior to any record release in order to collaborate their care with an outside provider. Patient/Guardian was advised if they have not already done so to contact the registration department to sign all necessary forms in order for us  to release information regarding their care.   Consent: Patient/Guardian gives verbal consent for treatment and assignment of benefits for services provided during this visit. Patient/Guardian expressed understanding and agreed to proceed.   Diagnosis: Major depressive disorder, recurrent, severe without psychotic features (HCC) [F33.2]    1. Major depressive disorder, recurrent, severe without psychotic features (HCC)   2. Chronic posttraumatic stress disorder       [name], [name] 01/24/2024

## 2024-01-24 NOTE — Progress Notes (Unsigned)
 Spoke with patient in person for PHP. States that this is his first time in PHP as recommended by his psychiatrist. States he has been dealing with depression and suicide since he was 63 years old. Multiple suicide attempts. Was inpatient 6 times this year. 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 Only caused a concussion and fractures. Was living with a friend but he moved and currently lives in his car. Has very poor sleep due to not sleeping in a bed. Has a lot of shame from his past. Hears voices at times telling him he is bad and needs to die. 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 On scale 1-10 as 10 being worst he rates depression at 3 and anxiety at 3. Denies SI/HI or AVH currently. PHQ9=11. Has dry mouth and ringing in ears as a side effect from medication but he does not want to change anything. No other issues or complaints. Pleasant and cooperative. Did say he was attacked by a dog at age 51 and had multiple surgeries on his face. 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

## 2024-01-25 ENCOUNTER — Encounter (HOSPITAL_COMMUNITY): Payer: Self-pay

## 2024-01-25 ENCOUNTER — Ambulatory Visit (HOSPITAL_COMMUNITY)

## 2024-01-25 ENCOUNTER — Ambulatory Visit (INDEPENDENT_AMBULATORY_CARE_PROVIDER_SITE_OTHER): Admitting: Licensed Clinical Social Worker

## 2024-01-25 DIAGNOSIS — R4589 Other symptoms and signs involving emotional state: Secondary | ICD-10-CM

## 2024-01-25 DIAGNOSIS — F332 Major depressive disorder, recurrent severe without psychotic features: Secondary | ICD-10-CM

## 2024-01-25 DIAGNOSIS — F411 Generalized anxiety disorder: Secondary | ICD-10-CM

## 2024-01-25 NOTE — Progress Notes (Signed)
 Psychiatric Initial Adult Assessment   Patient Identification: [name] MRN:  [id number] Date of Evaluation:  01/25/2024 Referral Source: Inpatient admission Chief Complaint:  Depression and suicidal ideations Visit Diagnosis:    ICD-10-CM   1. Major depressive disorder, recurrent, severe without psychotic features (HCC)  F33.2       History of Present Illness:  [name] is a 63 year old male who presented after a recent inpatient hospitalization.  He reports he was experiencing worsening depression with suicidal ideations to overdose on fentanyl .  He reports multiple attempts to harm himself in the past.  States this is a 6 inpatient hospitalization for the year.  Currently prescribed Abilify , Zoloft , prazosin  and Wellbutrin  which he reports he has been taking and tolerating well.  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  Denied that never been married or have any children. He reported limited family support.  States he has 2 sisters and a mother however, has a strained relationship with multiple family members.  Zaheer reports a history of alcohol  abuse which was the start of his mental health decline.  States he got laid off and then sold his condominium many years ago he was residing with his sister however stated that  she decided not been to problem with the worst in her so I had to move out. States he has been homeless ever since. Patient to start Partial Hospitalization program on 01/23/2024  Per assessment note on 10/15-[name] is a 63 y.o. male with a history of GAD, MDD, multiple hospitalizations for severe depression, 8 times in the past year with the most recent in October 2025, multiple history of suicide attempts with last in January 2025 who presents in person to [facility] for follow-up on 01/11/2024.   GROVE DEFINA is sitting ; he is alert/oriented x 4;  calm/cooperative; and mood congruent with affect.  Patient is speaking in a clear tone at moderate volume, and normal pace; with good eye contact.  He thought process is coherent and relevant; There is no indication that he is currently responding to internal/external stimuli or experiencing delusional thought content.  Patient denies suicidal/self-harm/homicidal ideation, psychosis, and paranoia. Patient has remained calm throughout assessment and has answered questions appropriately.   Associated Signs/Symptoms: Depression Symptoms:  depressed mood, anxiety, (Hypo) Manic Symptoms:  Distractibility, Anxiety Symptoms:  Excessive Worry, Psychotic Symptoms:  Hallucinations: None PTSD Symptoms: NA  Copied from chart. 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 Prior to that the last suicide attempt was in 1993. Prior Medications - Wellbutrin , Cymbalta, risperidone, quetiapine , lithium, selegiline, TCAs Treatments - previously in therapy, but not currently due to financial constraint  Previous Psychotropic Medications: Yes   Substance Abuse History in the last 12 months:  No.  Consequences of Substance Abuse: NA  Past Medical History:  Past Medical History:  Diagnosis Date   Cancer (HCC)    melanoma;  right ear   Depression    Diabetes mellitus    Diabetes mellitus, type II (HCC)    GERD (gastroesophageal reflux disease)    Hyperlipidemia    Hypertension    Metabolic syndrome    Obesity    PONV (postoperative  nausea and vomiting)    YRS AGO WITHER ETHER    Past Surgical History:  Procedure Laterality Date   COLONOSCOPY  05/01/2013   CYST REMOVAL TRUNK  1989   benign   MELANOMA EXCISION  2008   right ear and  neck   ORIF HUMERUS FRACTURE Left 12/23/2020   Procedure: OPEN REDUCTION INTERNAL FIXATION (ORIF) PROXIMAL HUMERUS FRACTURE;  Surgeon: [name], MD;  Location: [facility];  Service: Orthopedics;  Laterality: Left;    POLYPECTOMY     reconstructive plastic surgery face  1967   dog bite at age 32; about 102 surgeries for 10 years   TONSILLECTOMY  49   age 63    Family Psychiatric History:   Family History:  Family History  Problem Relation Age of Onset   Arthritis Mother    Alcohol  abuse Father    Stroke Father 31       heavy smoker 4-5 packs a day unfiltered   Schizophrenia Father        homeless and did not get medical care in time   Alcoholism Father    Colon cancer Neg Hx    Esophageal cancer Neg Hx    Rectal cancer Neg Hx    Stomach cancer Neg Hx    Colon polyps Neg Hx     Social History:   Social History   Socioeconomic History   Marital status: Single    Spouse name: Not on file   Number of children: 0   Years of education: Not on file   Highest education level: Bachelor's degree (e.g., BA, AB, BS)  Occupational History   Not on file  Tobacco Use   Smoking status: Former    Current packs/day: 0.00    Average packs/day: 0.5 packs/day for 5.0 years (2.5 ttl pk-yrs)    Types: Cigarettes    Start date: 05/20/1983    Quit date: 05/19/1988    Years since quitting: 35.7   Smokeless tobacco: Never   Tobacco comments:    quit in 1990  Vaping Use   Vaping status: Never Used  Substance and Sexual Activity   Alcohol  use: Yes    Alcohol /week: 5.0 standard drinks of alcohol     Types: 2 Glasses of wine, 3 Cans of beer per week    Comment: SOCIAL 3 TIMES PER WEEK 2 DRINKS AT A TIME   Drug use: Not Currently    Types: Marijuana    Comment: HX OF MONTHS AGO   Sexual activity: Never  Other Topics Concern   Not on file  Social History Narrative   Single. 1 dog, 2 cats. Lives alone.       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 Enjoys dinner parties.    Social Drivers of Health   Financial Resource Strain: High Risk (08/09/2023)   Overall Financial Resource Strain (CARDIA)    Difficulty of Paying Living Expenses: Hard  Food Insecurity: Food Insecurity Present (12/28/2023)   Hunger Vital Sign    Worried About Running Out of Food in the Last Year: Sometimes true    Ran Out of Food in the Last Year: Sometimes true  Transportation Needs: Unmet Transportation Needs (12/28/2023)   PRAPARE - Transportation  Lack of Transportation (Medical): Yes    Lack of Transportation (Non-Medical): Yes  Physical Activity: Sufficiently Active (08/09/2023)   Exercise Vital Sign    Days of Exercise per Week: 7 days    Minutes of Exercise per Session: 30 min  Stress: Stress Concern Present (08/09/2023)   [employer] of Occupational Health - Occupational Stress Questionnaire    Feeling of Stress : To some extent  Social Connections: Patient Declined (12/18/2023)   Social Connection and Isolation Panel    Frequency of Communication with Friends and Family: Patient declined    Frequency of Social Gatherings with Friends and Family: Patient declined    Attends Religious Services: Patient declined    Database Administrator or Organizations: Patient declined    Attends Banker Meetings: Patient declined    Marital Status: Patient declined    Additional Social History:   Allergies:   Allergies  Allergen Reactions   Other Other (See Comments)    Pollen - dries out right eye    Metabolic Disorder Labs: Lab Results  Component Value Date   HGBA1C 9.9 (H) 12/10/2023   MPG 237.43 12/10/2023   MPG 258 11/06/2023   No results found for: PROLACTIN Lab Results  Component Value Date   CHOL 279 (H)  11/06/2023   TRIG 282 (H) 11/06/2023   HDL 60 11/06/2023   CHOLHDL 4.7 11/06/2023   VLDL 56 (H) 11/06/2023   LDLCALC 163 (H) 11/06/2023   LDLCALC 57 08/25/2023   Lab Results  Component Value Date   TSH 2.337 12/10/2023    Therapeutic Level Labs: No results found for: LITHIUM No results found for: CBMZ No results found for: VALPROATE  Current Medications: Current Outpatient Medications  Medication Sig Dispense Refill   amLODipine  (NORVASC ) 10 MG tablet Take 1 tablet (10 mg total) by mouth daily. 30 tablet 0   ARIPiprazole  (ABILIFY ) 15 MG tablet Take 1 tablet (15 mg total) by mouth at bedtime. 30 tablet 0   atorvastatin  (LIPITOR) 40 MG tablet Take 1 tablet (40 mg total) by mouth daily. 30 tablet 0   buPROPion  (WELLBUTRIN  XL) 150 MG 24 hr tablet Take 1 tablet (150 mg total) by mouth daily. 30 tablet 0   glimepiride (AMARYL) 1 MG tablet Take 5 tablets (5 mg total) by mouth daily with breakfast. 150 tablet 0   hydrOXYzine  (ATARAX ) 25 MG tablet Take 1 tablet (25 mg total) by mouth 3 (three) times daily as needed for anxiety. 30 tablet 0   insulin  glargine (LANTUS) 100 UNIT/ML Solostar Pen Inject 5 Units into the skin daily at 10 pm. (discard pen after 28 days) 9 mL 0   lisinopril  (ZESTRIL ) 40 MG tablet Take 1 tablet (40 mg total) by mouth daily. 30 tablet 0   metFORMIN  (GLUCOPHAGE ) 1000 MG tablet Take 1 tablet (1,000 mg total) by mouth 2 (two) times daily with a meal. 60 tablet 0   pantoprazole  (PROTONIX ) 20 MG tablet Take 1 tablet (20 mg total) by mouth daily. 30 tablet 0   prazosin  (MINIPRESS ) 1 MG capsule Take 1 capsule (1 mg total) by mouth at bedtime. 30 capsule 0   sertraline  (ZOLOFT ) 100 MG tablet Take 2 tablets (200 mg total) by mouth daily. 60 tablet 0   traZODone  (DESYREL ) 50 MG tablet Take 1 tablet (50 mg total) by mouth at bedtime as needed for sleep. 15 tablet 1   No current facility-administered medications for this visit.    Musculoskeletal: Strength & Muscle  Tone: within  normal limits Gait & Station: normal Patient leans: N/A  Psychiatric Specialty Exam: Review of Systems  Psychiatric/Behavioral:  Positive for decreased concentration and sleep disturbance. The patient is nervous/anxious.     There were no vitals taken for this visit.There is no height or weight on file to calculate BMI.  General Appearance: Casual  Eye Contact:  Good  Speech:  Clear and Coherent  Volume:  Normal  Mood:  Anxious and Depressed  Affect:  Congruent  Thought Process:  Coherent  Orientation:  Full (Time, Place, and Person)  Thought Content:  Logical  Suicidal Thoughts:  No  Homicidal Thoughts:  No  Memory:  Immediate;   Good Recent;   Good  Judgement:  Good  Insight:  Fair  Psychomotor Activity:  Normal  Concentration:  Concentration: Good  Recall:  Good  Fund of Knowledge:Good  Language: Good  Akathisia:  No  Handed:  Right  AIMS (if indicated):  not done  Assets:  Communication Skills Desire for Improvement  ADL's:  Intact  Cognition: WNL  Sleep:  Fair   Screenings: AIMS    Flowsheet Row [facility]  AIMS Total Score 0   AUDIT    Flowsheet Row Ad[facility] (Dis[facility] Admission (Discharged) [facility] Admission (Discharged) from 11/06/2023 in [facility] Admission (Discharged) [facility] Office Visit from 03/10/2023 in [facility]  Alcohol  Use Disorder Identification Test Final Score (AUDIT) 2 0 0 0 10    GAD-7    Flowsheet Row Counselor from 01/23/2024 in [facility] Counselor from 01/12/2024 in [facility] Visit [facility] Visit from 03/10/2023 in [facility] [place]  Total GAD-7 Score 8 3 16 5    [employer]    Flowsheet Row Counselor from 01/23/2024 in [facility] Counselor from 01/12/2024 in [facility] Visit from [facility]/11[facility]24 [facility] from 12/27/2023 in [facility] Office Visit from 12/12/2023 [facility]  PHQ-2 Total Score 2 2 2 4 5   PHQ-9 Total Score 12 10 -- 16 20   Flowsheet Row Counselor from 01/12/2024 in [facility] Admission (Discharged) [facility] from 12/27/2023 in [facility]  C-SSRS RISK CATEGORY High Risk High Risk High Risk    Assessment and Plan:  Nigil Braman enrolled in Partial Hospitalization Program, patient's current medications are to be continued, the following medications are being continued a comprehensive treatment plan will be developed and side effects of medications have been reviewed with patient  Treatment options and alternatives reviewed with patient and patient understands the above plan. Treatment plan was reviewed and agreed upon by NP T.Ezzard and patient Ladarian Bonczek need for group services.  Collaboration of Care: Medication Management AEB Zoloft  200 mg, Prazosin  1 mg, Trazodone  50 mg and Bupropion  150 mg  Patient/Guardian was advised Release of Information must be obtained prior to any record release in order to collaborate their care with an outside provider. Patient/Guardian was advised if they have not already done so to contact the  registration department to sign all necessary forms in order for us  to release information regarding their care.   Consent: Patient/Guardian gives verbal consent  for treatment and assignment of benefits for services provided during this visit. Patient/Guardian expressed understanding and agreed to proceed.   Staci LOISE Kerns, NP 10/29/202512:14 PM

## 2024-01-25 NOTE — Therapy (Signed)
 [facility] [address] [place], [place], [address] Phone: [phone]   Fax:  [phone]  Occupational Therapy Evaluation  Patient Details  Name: [name] MRN: [id number] Date of Birth: 1960/12/03 No data recorded  Encounter Date: 01/23/2024   OT End of Session - 01/25/24 2129     Visit Number 1    Number of Visits 15    Date for Recertification  02/10/24    OT Start Time 1230    OT Stop Time 1330    OT Time Calculation (min) 60 min    Activity Tolerance Patient tolerated treatment well          Past Medical History:  Diagnosis Date   Cancer (HCC)    melanoma;  right ear   Depression    Diabetes mellitus    Diabetes mellitus, type II (HCC)    GERD (gastroesophageal reflux disease)    Hyperlipidemia    Hypertension    Metabolic syndrome    Obesity    PONV (postoperative nausea and vomiting)    YRS AGO WITHER ETHER    Past Surgical History:  Procedure Laterality Date   COLONOSCOPY  05/01/2013   CYST REMOVAL TRUNK  1989   benign   MELANOMA EXCISION  2008   right ear and neck   ORIF HUMERUS FRACTURE Left 12/23/2020   Procedure: OPEN REDUCTION INTERNAL FIXATION (ORIF) PROXIMAL HUMERUS FRACTURE;  Surgeon: [name], MD;  Location: [facility];  Service: Orthopedics;  Laterality: Left;    POLYPECTOMY     reconstructive plastic surgery face  1967   dog bite at age 88; about 15 surgeries for 10 years   TONSILLECTOMY  1966   age 62    There were no vitals filed for this visit.   Subjective Assessment - 01/25/24 2127     Subjective  I hope to learn new skills for dealing with stress and anxiety    Pertinent History MDD    Currently in Pain? No/denies    Pain Score 0-No pain                    Group Session: O: Group members participated in a structured occupational therapy group focused on exploring the relationship between energy levels and meaningful engagement in daily activities. The session  introduced a visual rating system allowing participants to identify how much energy an activity typically requires and how meaningful or rewarding it feels. Through guided discussion and individualized reflection, patients examined patterns of imbalance between "high-energy/low-meaning" and "low-energy/high-meaning" tasks to promote awareness of how energy expenditure aligns with personal values, motivation, and daily functioning.   A:  The patient was actively engaged throughout the session, demonstrating insight into personal activity patterns and contributing thoughtful examples of tasks that affect their energy and sense of purpose. They appeared receptive to exploring strategies for balancing meaningful and energizing activities, showing progress toward improved occupational awareness and self-management of routines.                   OT Education - 01/25/24 2129     Education Details OCAIRS / Tx: Energy and Meaningful Activities    Person(s) Educated Patient           OT Short Term Goals - 01/25/24 2132       OT SHORT TERM GOAL #1   Title By the time of discharge, client will independently set, track, and make progress towards a long-term goal, demonstrating resilience  in overcoming obstacles and seeking support when needed.    Time 3    Period Weeks    Status On-going    Target Date 02/10/24      OT SHORT TERM GOAL #2   Title Client will independently identify and modify three areas of the current routine that contribute to increased stress or dysfunction by the end of therapy.    Status On-going      OT SHORT TERM GOAL #3   Title Client will independently identify and list three personal triggers that lead to heightened stress or negative emotional responses by the end of 3 sessions.    Status On-going      OT SHORT TERM GOAL #4   Title Client will demonstrate the use of at least two different relaxation techniques (e.g., deep breathing, progressive muscle  relaxation) without prompting when faced with a stressful situation in four out of five trials.    Status On-going                   Plan - 01/25/24 2130     Clinical Impression Statement Pt presents w/ deficits across multiple psychosoical doamins to the extent inhibits daily activities and overall occupational performance    OT Occupational Profile and History Problem Focused Assessment - Including review of records relating to presenting problem    Occupational performance deficits (Please refer to evaluation for details): Rest and Sleep;Leisure;Work;IADL's;Social Participation    Psychosocial Skills Interpersonal Interaction;Habits;Environmental  Adaptations;Coping Strategies;Routines and Behaviors    Rehab Potential Good    Clinical Decision Making Limited treatment options, no task modification necessary    Comorbidities Affecting Occupational Performance: None    Modification or Assistance to Complete Evaluation  No modification of tasks or assist necessary to complete eval    OT Frequency 5x / week    OT Duration Other (comment)   3 weeks   OT Treatment/Interventions Psychosocial skills training;Coping strategies training          Patient will benefit from skilled therapeutic intervention in order to improve the following deficits and impairments:       Psychosocial Skills: Interpersonal Interaction, Habits, Environmental  Adaptations, Coping Strategies, Routines and Behaviors   Visit Diagnosis: Difficulty coping  Major depressive disorder, recurrent, severe without psychotic features (HCC)  Chronic posttraumatic stress disorder  GAD (generalized anxiety disorder)    Problem List Patient Active Problem List   Diagnosis Date Noted   Major depressive disorder, recurrent, severe without psychotic features (HCC) 12/28/2023   Major depressive disorder, recurrent episode, severe, with psychotic behavior (HCC) 11/06/2023   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  05/11/2023   Severe alcohol  use disorder (HCC) 05/09/2023   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 R ear.  12/16/2006   Major depression, recurrent, full remission 12/16/2006   Hyperlipidemia associated with type 2 diabetes mellitus (HCC) 11/07/2006    [name], OT 01/25/2024, 9:36 PM  [name], OT   [facility]  [facility] [address] [place], [place], [address] Phone: [phone]   Fax:  [phone]  Name: ESTEVAN KERSH MRN: [id number] Date of Birth: 1961/01/10
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
## 2024-01-26 ENCOUNTER — Ambulatory Visit (HOSPITAL_COMMUNITY)

## 2024-01-26 ENCOUNTER — Encounter (HOSPITAL_COMMUNITY): Payer: Self-pay

## 2024-01-26 DIAGNOSIS — F332 Major depressive disorder, recurrent severe without psychotic features: Secondary | ICD-10-CM

## 2024-01-26 DIAGNOSIS — R4589 Other symptoms and signs involving emotional state: Secondary | ICD-10-CM

## 2024-01-26 DIAGNOSIS — F411 Generalized anxiety disorder: Secondary | ICD-10-CM

## 2024-01-26 NOTE — Therapy (Signed)
 [facility] [address] [place], [place], [address] Phone: [phone]   Fax:  [phone]  Occupational Therapy Treatment  Patient Details  Name: [name] MRN: [id number] Date of Birth: 1960-04-17 No data recorded  Encounter Date: 01/24/2024   OT End of Session - 01/26/24 2132     Visit Number 2    Number of Visits 15    Date for Recertification  02/10/24    OT Start Time 1230    OT Stop Time 1330    OT Time Calculation (min) 60 min          Past Medical History:  Diagnosis Date   Cancer (HCC)    melanoma;  right ear   Depression    Diabetes mellitus    Diabetes mellitus, type II (HCC)    GERD (gastroesophageal reflux disease)    Hyperlipidemia    Hypertension    Metabolic syndrome    Obesity    PONV (postoperative nausea and vomiting)    YRS AGO WITHER ETHER    Past Surgical History:  Procedure Laterality Date   COLONOSCOPY  05/01/2013   CYST REMOVAL TRUNK  1989   benign   MELANOMA EXCISION  2008   right ear and neck   ORIF HUMERUS FRACTURE Left 12/23/2020   Procedure: OPEN REDUCTION INTERNAL FIXATION (ORIF) PROXIMAL HUMERUS FRACTURE;  Surgeon: [name], MD;  Location: [facility];  Service: Orthopedics;  Laterality: Left;    POLYPECTOMY     reconstructive plastic surgery face  1967   dog bite at age 38; about 15 surgeries for 10 years   TONSILLECTOMY  1966   age 66    There were no vitals filed for this visit.   Subjective Assessment - 01/26/24 2131     Currently in Pain? No/denies    Pain Score 0-No pain               Group Session:  S: Doing better today. Sleep is still a real pain point due to having to sleep in the car.   O: During the group therapy session, the occupational therapist discussed the impact of sleep disturbances on daily activities and overall health and wellbeing.   The OT also reviewed various types of sleep disorders, including insomnia, sleep apnea, restless leg  syndrome, and narcolepsy, and their associated symptoms. Strategies for managing and treating sleep disturbances were also discussed, such as establishing a consistent sleep routine, avoiding stimulants before bedtime, and engaging in relaxation techniques.   Today's group also included information on how sleep disturbances can cause fatigue, mood changes, cognitive impairment, and physical health problems, and emphasizes the importance of seeking prompt treatment to maintain overall health and wellbeing.   A: In today's session, the patient demonstrated active engagement with the topic of The Importance of Sleep. They eagerly asked questions, contributed personal experiences, and showcased a noticeable eagerness to apply the discussed principles. Their participation indicated not only a strong understanding of the subject matter but also an intrinsic motivation to implement better sleep practices in their daily routine. Based on their proactive involvement, it is assessed that the patient greatly benefited from today's treatment and will likely make efforts to incorporate the insights gained.   P: Continue to attend PHP OT group sessions 5x week for 4 weeks to promote daily structure, social engagement, and opportunities to develop and utilize adaptive strategies to maximize functional performance in preparation for safe transition and integration back into school, work,  and the community. Plan to address topic of sleep in next OT group session.                 OT Education - 01/26/24 2132     Education Details Sleep           OT Short Term Goals - 01/25/24 2132       OT SHORT TERM GOAL #1   Title By the time of discharge, client will independently set, track, and make progress towards a long-term goal, demonstrating resilience in overcoming obstacles and seeking support when needed.    Time 3    Period Weeks    Status On-going    Target Date 02/10/24      OT SHORT TERM  GOAL #2   Title Client will independently identify and modify three areas of the current routine that contribute to increased stress or dysfunction by the end of therapy.    Status On-going      OT SHORT TERM GOAL #3   Title Client will independently identify and list three personal triggers that lead to heightened stress or negative emotional responses by the end of 3 sessions.    Status On-going      OT SHORT TERM GOAL #4   Title Client will demonstrate the use of at least two different relaxation techniques (e.g., deep breathing, progressive muscle relaxation) without prompting when faced with a stressful situation in four out of five trials.    Status On-going                   Plan - 01/26/24 2132     Psychosocial Skills Interpersonal Interaction;Habits;Environmental  Adaptations;Coping Strategies;Routines and Behaviors          Patient will benefit from skilled therapeutic intervention in order to improve the following deficits and impairments:       Psychosocial Skills: Interpersonal Interaction, Habits, Environmental  Adaptations, Coping Strategies, Routines and Behaviors   Visit Diagnosis: Difficulty coping    Problem List Patient Active Problem List   Diagnosis Date Noted   Major depressive disorder, recurrent, severe without psychotic features (HCC) 12/28/2023   Major depressive disorder, recurrent episode, severe, with psychotic behavior (HCC) 11/06/2023   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 05/11/2023   Severe alcohol  use disorder (HCC) 05/09/2023   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 R ear.  12/16/2006   Major depression, recurrent, full remission 12/16/2006   Hyperlipidemia associated with type 2 diabetes mellitus (HCC) 11/07/2006    [place] KANDICE Purpura, OT 01/26/2024, 9:33 PM  D[place] Purpura, OT   [facility] [address] [place], [place], [address] Phone: [phone]   Fax:  [phone]  Name: SAKAI WOLFORD MRN: [id number] Date of Birth: 1960-11-19

## 2024-01-27 ENCOUNTER — Ambulatory Visit (HOSPITAL_COMMUNITY)

## 2024-01-27 ENCOUNTER — Ambulatory Visit (INDEPENDENT_AMBULATORY_CARE_PROVIDER_SITE_OTHER): Admitting: Licensed Clinical Social Worker

## 2024-01-27 DIAGNOSIS — R4589 Other symptoms and signs involving emotional state: Secondary | ICD-10-CM

## 2024-01-27 DIAGNOSIS — F332 Major depressive disorder, recurrent severe without psychotic features: Secondary | ICD-10-CM | POA: Diagnosis not present

## 2024-01-27 DIAGNOSIS — F411 Generalized anxiety disorder: Secondary | ICD-10-CM

## 2024-01-27 NOTE — Psych (Signed)
 [facility] BH PHP THERAPIST PROGRESS NOTE  [name] [id number]   Session Time: 9:00 am - 10:00 am  Participation Level: Active  Behavioral Response: CasualAlertAnxious and Depressed  Type of Therapy: Group Therapy  Treatment Goals addressed: Coping  Progress Towards Goals: Progressing  Interventions: CBT, DBT, Solution Focused, Strength-based, Supportive, and Reframing  Therapist Response: Clinician led check-in regarding current stressors and situation, and review of patient completed daily inventory. Clinician utilized active listening and empathetic response and validated patient emotions. Clinician facilitated processing group on pertinent issues.?   Summary: Patient arrived within time allowed. Patient rates their depression at a 3 and anxiety at a 4 on a scale of 1-10 with 10 being high. Pt reports they slept 2 hours last night and ate 2x yesterday. Pt denied experiencing SI/SH thoughts and endorses current feelings of hopelessness. Pt reports he was unable to find a place to park his car so he could sleep last night. Pt reports dealing with negative self-talk, rumination, and shame. Pt is visibly downcast and declines to share more. Pt able to process.?Pt engaged in discussion.?      Session Time: 10:00 am - 11:00 am  Participation Level: Active  Behavioral Response: CasualAlertAnxious and Depressed  Type of Therapy: Group Therapy  Treatment Goals addressed: Coping  Progress Towards Goals: Progressing  Interventions: CBT, DBT, Solution Focused, Strength-based, Supportive, and Reframing  Therapist Response: Cln led discussion on DBT dialectics and balance. Cln encouraged pt's to utilize AND statements to cue their brain to hold two opposing ideas. Cln provided examples and group members created their own AND statements.   Summary: Pt engaged in discussion and created AND statement around recognzing progress.            Session Time: 11:00 am - 12:00 pm    Participation Level: Active   Behavioral Response: CasualAlertAnxious   Type of Therapy: Group Therapy   Treatment Goals addressed: Coping   Progress Towards Goals: Progressing   Interventions: CBT, DBT, Solution Focused, Strength-based, Supportive, and Reframing   Therapist Response: Cln led discussion on negative self-talk and how it affects us . Cln utilized CBT to discuss how thoughts shape our feelings and actions. Group members shared how negative thinking affects them and worked to reframe their negative thinking.     Summary:  Pt engaged in discussion and reports understanding.              Session Time: 12:00 pm - 12:30 pm   Participation Level: Active   Behavioral Response: CasualAlertAnxious   Type of Therapy: Group Therapy   Treatment Goals addressed: Coping   Progress Towards Goals: Progressing   Interventions: Psychologist, Occupational, Supportive   Therapist Response: Reflection Group: Patients encouraged to practice skills and interpersonal techniques or work on mindfulness and relaxation techniques. The importance of self-care and making skills part of a routine to increase usage were stressed.   Summary: Patient engaged and participated appropriately.           Session Time: 12:30 pm - 1:30 pm   Participation Level: Active   Behavioral Response: CasualAlertAnxious   Type of Therapy: Group Therapy   Treatment Goals addressed: Coping   Progress Towards Goals: Progressing   Interventions: OT group   Therapist Response: Group was led by occupational therapist, Edward Hollan.    Summary: Pt engaged and participated in discussion.           Session Time: 1:30 pm - 2:00 pm   Participation Level: Active  Behavioral Response: CasualAlertAnxious   Type of Therapy: Group Therapy   Treatment Goals addressed: Coping   Progress Towards Goals: Progressing   Interventions: CBT, DBT, Solution Focused, Strength-based, Supportive, and  Reframing   Therapist Response: 1:30 pm - 1:50 pm: Cln led group through guided streatching with deep breathing as a way to increase relaxation and decrease stress and tension being held in the body. Cln discussed the importance of harnessing the body-mind connection informed by DBT TIPP skills.  1:50 - 2:00 pm: Clinician led check-out. Clinician assessed for immediate needs, medication compliance and efficacy, and safety concerns?   Summary: 1:30 pm - 1:50 pm:  Pt engaged in discussion and practice.  1:50 pm - 2:00 pm: At check-out, patient reports no immediate concerns. Patient demonstrates progress as evidenced by engagement and responsiveness to treatment. Patient denies SI/HI/self-harm thoughts at the end of group.   Suicidal/Homicidal: Nowithout intent/plan  Plan: ?Pt will continue in PHP and medication management while continuing to work on decreasing depression symptoms,?SI, and anxiety symptoms,?and increasing the ability to self manage symptoms.     Collaboration of Care: Medication Management AEB Staci Kerns, NP  Patient/Guardian was advised Release of Information must be obtained prior to any record release in order to collaborate their care with an outside provider. Patient/Guardian was advised if they have not already done so to contact the registration department to sign all necessary forms in order for us  to release information regarding their care.   Consent: Patient/Guardian gives verbal consent for treatment and assignment of benefits for services provided during this visit. Patient/Guardian expressed understanding and agreed to proceed.   Diagnosis: Major depressive disorder, recurrent, severe without psychotic features (HCC) [F33.2]    1. Major depressive disorder, recurrent, severe without psychotic features (HCC)   2. GAD (generalized anxiety disorder)       Randall Bastos, LCSW

## 2024-01-27 NOTE — Psych (Signed)
 [facility] BH PHP THERAPIST PROGRESS NOTE  GLENDALE YOUNGBLOOD [id number]   Session Time: 9:00 am - 10:00 am  Participation Level: Active  Behavioral Response: CasualAlertAnxious and Depressed  Type of Therapy: Group Therapy  Treatment Goals addressed: Coping  Progress Towards Goals: Progressing  Interventions: CBT, DBT, Solution Focused, Strength-based, Supportive, and Reframing  Therapist Response: Clinician led check-in regarding current stressors and situation, and review of patient completed daily inventory. Clinician utilized active listening and empathetic response and validated patient emotions. Clinician facilitated processing group on pertinent issues.?   Summary: Patient arrived within time allowed. Patient rates their depression at a 4 and anxiety at a 4 on a scale of 1-10 with 10 being high. Pt reports they slept 8 hours last night and ate 3x yesterday. Pt denied experiencing SI/SH thoughts and endorses current feelings of hopelessness. Pt shares he stayed with a friend last night and was able to sleep well. Pt reports negative self-talk continues to be a struggle and that sometimes I hate myself. Pt reports he is not ready to share more in depth.  Pt able to process.?Pt engaged in discussion.?      Session Time: 10:00 am - 11:00 am  Participation Level: Active  Behavioral Response: CasualAlertAnxious and Depressed  Type of Therapy: Group Therapy  Treatment Goals addressed: Coping  Progress Towards Goals: Progressing  Interventions: CBT, DBT, Solution Focused, Strength-based, Supportive, and Reframing  Therapist Response: Cln led discussion on healthy aggression substitutes. Cln discussed the benefits to discharging energy and adrenaline when feeling revved up in emotion and the importance of balancing that discharge with safety and lack of negative consequences. Group brainstormed different ways to channel aggression in a healthy way and shared ways that have worked  for them in the past.   Summary: Pt engaged in discussion and identifies 3 options to try.          Session Time: 11:00 am - 12:00 pm   Participation Level: Active   Behavioral Response: CasualAlertAnxious   Type of Therapy: Group Therapy   Treatment Goals addressed: Coping   Progress Towards Goals: Progressing   Interventions: CBT, DBT, Solution Focused, Strength-based, Supportive, and Reframing   Therapist Response: Cln introduced CBT and the way in which it can provide context for addressing stumbling blocks. Group discussed the problem is not the problem, the problem is how we're thinking about the problem and tried to change perspective on current struggles.    Summary:  Pt engaged in discussion and is able to attempt reframing using CBT.              Session Time: 12:00 pm - 12:30 pm   Participation Level: Active   Behavioral Response: CasualAlertAnxious   Type of Therapy: Group Therapy   Treatment Goals addressed: Coping   Progress Towards Goals: Progressing   Interventions: Psychologist, Occupational, Supportive   Therapist Response: Reflection Group: Patients encouraged to practice skills and interpersonal techniques or work on mindfulness and relaxation techniques. The importance of self-care and making skills part of a routine to increase usage were stressed.   Summary: Patient engaged and participated appropriately.           Session Time: 12:30 pm - 1:30 pm   Participation Level: Active   Behavioral Response: CasualAlertAnxious   Type of Therapy: Group Therapy   Treatment Goals addressed: Coping   Progress Towards Goals: Progressing   Interventions: OT group   Therapist Response: Group was led by occupational therapist, Edward Hollan.  Summary: Pt engaged and participated in discussion.           Session Time: 1:30 pm - 1:45 pm   Participation Level: Active   Behavioral Response: CasualAlertAnxious   Type of Therapy: Group  Therapy   Treatment Goals addressed: Coping   Progress Towards Goals: Progressing   Interventions: CBT, DBT, Solution Focused, Strength-based, Supportive, and Reframing   Therapist Response: 1:30 pm - 1:45 pm: Clinician led check-out. Clinician assessed for immediate needs, medication compliance and efficacy, and safety concerns?   Summary: 1:30 pm - 1:45 pm: At check-out, patient reports no immediate concerns. Patient demonstrates progress as evidenced by engagement and responsiveness to treatment. Patient denies SI/HI/self-harm thoughts at the end of group.    Suicidal/Homicidal: Nowithout intent/plan  Plan: ?Pt will continue in PHP and medication management while continuing to work on decreasing depression symptoms,?SI, and anxiety symptoms,?and increasing the ability to self manage symptoms.     Collaboration of Care: Medication Management AEB Staci Kerns, NP  Patient/Guardian was advised Release of Information must be obtained prior to any record release in order to collaborate their care with an outside provider. Patient/Guardian was advised if they have not already done so to contact the registration department to sign all necessary forms in order for us  to release information regarding their care.   Consent: Patient/Guardian gives verbal consent for treatment and assignment of benefits for services provided during this visit. Patient/Guardian expressed understanding and agreed to proceed.   Diagnosis: Major depressive disorder, recurrent, severe without psychotic features (HCC) [F33.2]    1. Major depressive disorder, recurrent, severe without psychotic features (HCC)   2. GAD (generalized anxiety disorder)       Randall Bastos, LCSW

## 2024-01-27 NOTE — Psych (Signed)
 [facility] BH PHP THERAPIST PROGRESS NOTE  AVION KUTZER [id number]   Session Time: 9:00 am - 10:00 am  Participation Level: Active  Behavioral Response: CasualAlertAnxious and Depressed  Type of Therapy: Group Therapy  Treatment Goals addressed: Coping  Progress Towards Goals: Progressing  Interventions: CBT, DBT, Solution Focused, Strength-based, Supportive, and Reframing  Therapist Response: Clinician led check-in regarding current stressors and situation, and review of patient completed daily inventory. Clinician utilized active listening and empathetic response and validated patient emotions. Clinician facilitated processing group on pertinent issues.?   Summary: Patient arrived within time allowed. Patient rates their depression at a 2 and anxiety at a 3 on a scale of 1-10 with 10 being high. Pt reports they slept 2 hours last night and ate 2x yesterday. Pt denied experiencing SI/SH thoughts and endorses feelings of hopelessness and negative self-talk. Pt reports he has Door Dash-ed 10 hours since last group session, beginning at 3am this morning because he was unable to sleep. Pt reports he does feel exhausted right now and may try to take a nap after group. Pt reports he has a possible apartment that he hopes to be in soon and will follow up with the landlord this afternoon. Pt able to process.?Pt engaged in discussion.?      Session Time: 10:00 am - 11:00 am  Participation Level: Active  Behavioral Response: CasualAlertAnxious and Depressed  Type of Therapy: Group Therapy  Treatment Goals addressed: Coping  Progress Towards Goals: Progressing  Interventions: CBT, DBT, Solution Focused, Strength-based, Supportive, and Reframing  Therapist Response: Cln led discussion on feelings of worthlessness. Cln created space for group members to share the way worthless feelings have affected them and play a role in their symptomology. Cln informed discussion with CBT cognitive  distortion emotional reasoning, CBT thought challenging, and DBT wise mind.  Summary: Pt engaged in discussion and is able to process.       Session Time: 11:00 am - 12:00 pm  Participation Level: Active  Behavioral Response: CasualAlertAnxious  Type of Therapy: Group Therapy  Treatment Goals addressed: Coping  Progress Towards Goals: Progressing  Interventions: CBT, DBT, Solution Focused, Strength-based, Supportive, and Reframing  Therapist Response: Cln introduced topic of CBT cognitive distortions. Cln discussed unhealthy thought patterns and how our thoughts shape our reality and irrational thoughts can alter our perspective. Cln utilized handout cognitive distortions to discuss common examples of distorted thoughts and group members worked to identify examples in their own life.   Summary: Pt engaged in discussion and is able to determine examples of distorted thinking in their own life.      Session Time: 12:00 pm - 1:00 pm  Participation Level: Active  Behavioral Response: CasualAlertAnxious  Type of Therapy: Group Therapy  Treatment Goals addressed: Coping  Progress Towards Goals: Progressing  Interventions: OT group  Therapist Response: 12:00 - 12:50 Group was led by occupational therapist, Dallas Purpura.  12:50 - 1:00 pm: Clinician led check-out. Clinician assessed for immediate needs, medication compliance and efficacy, and safety concerns.  Summary: 12:00 - 12:50 Pt engaged and participated in discussion. 12:50 - 1:00 At check-out, patient contracts for safety.?Patient demonstrates progress as evidenced by her engagement and by being receptive to treatment. Patient denies SI/HI/self-harm thoughts at the end of group and agrees to seek help should those thoughts/feelings occur.?     Suicidal/Homicidal: Nowithout intent/plan  Plan: ?Pt will continue in PHP and medication management while continuing to work on decreasing depression symptoms,?SI, and  anxiety symptoms,?and increasing the ability  to self manage symptoms.     Collaboration of Care: Medication Management AEB Staci Kerns, NP  Patient/Guardian was advised Release of Information must be obtained prior to any record release in order to collaborate their care with an outside provider. Patient/Guardian was advised if they have not already done so to contact the registration department to sign all necessary forms in order for us  to release information regarding their care.   Consent: Patient/Guardian gives verbal consent for treatment and assignment of benefits for services provided during this visit. Patient/Guardian expressed understanding and agreed to proceed.   Diagnosis: Major depressive disorder, recurrent, severe without psychotic features (HCC) [F33.2]    1. Major depressive disorder, recurrent, severe without psychotic features (HCC)   2. GAD (generalized anxiety disorder)       Randall Bastos, LCSW

## 2024-01-28 ENCOUNTER — Other Ambulatory Visit: Payer: Self-pay

## 2024-01-28 ENCOUNTER — Other Ambulatory Visit (HOSPITAL_COMMUNITY): Payer: Self-pay

## 2024-01-28 DIAGNOSIS — F411 Generalized anxiety disorder: Secondary | ICD-10-CM

## 2024-01-28 DIAGNOSIS — F332 Major depressive disorder, recurrent severe without psychotic features: Secondary | ICD-10-CM

## 2024-01-28 DIAGNOSIS — F4312 Post-traumatic stress disorder, chronic: Secondary | ICD-10-CM

## 2024-01-29 ENCOUNTER — Other Ambulatory Visit: Payer: Self-pay

## 2024-01-30 ENCOUNTER — Ambulatory Visit (HOSPITAL_COMMUNITY)

## 2024-01-30 ENCOUNTER — Other Ambulatory Visit: Payer: Self-pay

## 2024-01-30 ENCOUNTER — Ambulatory Visit (INDEPENDENT_AMBULATORY_CARE_PROVIDER_SITE_OTHER): Admitting: Licensed Clinical Social Worker

## 2024-01-30 DIAGNOSIS — R4589 Other symptoms and signs involving emotional state: Secondary | ICD-10-CM

## 2024-01-30 DIAGNOSIS — F4312 Post-traumatic stress disorder, chronic: Secondary | ICD-10-CM

## 2024-01-30 DIAGNOSIS — F332 Major depressive disorder, recurrent severe without psychotic features: Secondary | ICD-10-CM | POA: Diagnosis not present

## 2024-01-30 DIAGNOSIS — F411 Generalized anxiety disorder: Secondary | ICD-10-CM

## 2024-01-30 MED ORDER — ARIPIPRAZOLE 15 MG PO TABS: 15.0000 mg | ORAL_TABLET | Freq: Every day | ORAL | 0 refills | Status: DC

## 2024-01-30 MED ORDER — BUPROPION HCL ER (XL) 150 MG PO TB24: 150.0000 mg | ORAL_TABLET | Freq: Every day | ORAL | 0 refills | Status: DC

## 2024-01-30 MED ORDER — PRAZOSIN HCL 1 MG PO CAPS: 1.0000 mg | ORAL_CAPSULE | Freq: Every day | ORAL | 0 refills | Status: DC

## 2024-01-30 MED ORDER — TRAZODONE HCL 50 MG PO TABS
50.0000 mg | ORAL_TABLET | Freq: Every evening | ORAL | 1 refills | Status: DC | PRN
Start: 2024-01-30 — End: 2024-02-13

## 2024-01-30 MED ORDER — HYDROXYZINE HCL 25 MG PO TABS: 25.0000 mg | ORAL_TABLET | Freq: Three times a day (TID) | ORAL | 0 refills | Status: DC | PRN

## 2024-01-30 MED ORDER — SERTRALINE HCL 100 MG PO TABS: 200.0000 mg | ORAL_TABLET | Freq: Every day | ORAL | 0 refills | Status: DC

## 2024-01-30 NOTE — Progress Notes (Unsigned)
 BH MD/PA/NP OP Progress Note  01/31/2024 10:29 AM DECORIAN SCHUENEMANN  MRN:  [id number]  Chief Complaint: Weekly progress assessment  HPI: [name] is a 63 year old Caucasian male who is currently attending [facility] partial hospitalization programming.  He was seen and evaluated face-to-face.  States overall,  his mood has improved.  He is denying suicidal or homicidal ideations during this assessment.   Rexton is rating his depression 2 out of 10 with 10 being the worst.  States that he has been taking his medication as directed.  Reports learning multiple coping skills to include cognitive behavioral therapy however works and how to apply it.  States he is encouraged and hopeful about his follow-up appointment with [employer] as his application has been accepted and he has a follow-up appointment this Thursday.    [name] states he is currently residing in his car so sleep has been hit or miss.  [name] denied any concerns related to medication side effects.  Will make medication refills available.  Patient to continue group session.  Support, encouragement and reassurance was provided.  Visit Diagnosis:    ICD-10-CM   1. Major depressive disorder, recurrent, severe without psychotic features (HCC)  F33.2 buPROPion  (WELLBUTRIN  XL) 150 MG 24 hr tablet    sertraline  (ZOLOFT ) 100 MG tablet    ARIPiprazole  (ABILIFY ) 15 MG tablet    2. Chronic posttraumatic stress disorder  F43.12 hydrOXYzine  (ATARAX ) 25 MG tablet    traZODone  (DESYREL ) 50 MG tablet    prazosin  (MINIPRESS ) 1 MG capsule    3. GAD (generalized anxiety disorder)  F41.1 hydrOXYzine  (ATARAX ) 25 MG tablet    sertraline  (ZOLOFT ) 100 MG tablet      Past Psychiatric History: Documented history related to posttraumatic stress disorder, major depressive disorder generalized anxiety disorder difficulty coping and suicidal attempt/plan.  Patient reports a history with alcohol  misuse. 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 Prior to that the last suicide attempt was in 1993. Prior Medications - Wellbutrin , Cymbalta, risperidone, quetiapine , lithium, selegiline, TCAs Treatments - previously in therapy, but not currently due to financial constraints.  Past Medical History:  Past Medical History:  Diagnosis Date   Cancer (HCC)    melanoma;  right ear   Depression    Diabetes mellitus    Diabetes mellitus, type II (HCC)    GERD (gastroesophageal reflux disease)    Hyperlipidemia    Hypertension    Metabolic syndrome    Obesity    PONV (postoperative nausea and vomiting)    YRS AGO WITHER ETHER    Past Surgical History:  Procedure Laterality Date   COLONOSCOPY  05/01/2013   CYST REMOVAL TRUNK  1989   benign   MELANOMA EXCISION  2008   right ear and neck   ORIF HUMERUS FRACTURE Left 12/23/2020   Procedure: OPEN REDUCTION INTERNAL FIXATION (ORIF) PROXIMAL HUMERUS FRACTURE;  Surgeon: [name], MD;  Location: [facility];  Service: Orthopedics;  Laterality: Left;    POLYPECTOMY     reconstructive plastic surgery face  1967   dog bite at age 31; about 15 surgeries for 10 years   TONSILLECTOMY  94   age 47    Family Psychiatric History:   Family History:  Family  History  Problem Relation Age of Onset   Arthritis Mother    Alcohol  abuse Father    Stroke Father 33       heavy smoker 4-5 packs a day unfiltered   Schizophrenia Father        homeless and did not get medical care in time   Alcoholism Father    Colon cancer Neg Hx    Esophageal cancer Neg Hx    Rectal cancer Neg Hx    Stomach cancer Neg Hx    Colon polyps Neg Hx     Social History:  Social History   Socioeconomic History    Marital status: Single    Spouse name: Not on file   Number of children: 0   Years of education: Not on file   Highest education level: Bachelor's degree (e.g., BA, AB, BS)  Occupational History   Not on file  Tobacco Use   Smoking status: Former    Current packs/day: 0.00    Average packs/day: 0.5 packs/day for 5.0 years (2.5 ttl pk-yrs)    Types: Cigarettes    Start date: 05/20/1983    Quit date: 05/19/1988    Years since quitting: 35.7   Smokeless tobacco: Never   Tobacco comments:    quit in 1990  Vaping Use   Vaping status: Never Used  Substance and Sexual Activity   Alcohol  use: Yes    Alcohol /week: 5.0 standard drinks of alcohol     Types: 2 Glasses of wine, 3 Cans of beer per week    Comment: SOCIAL 3 TIMES PER WEEK 2 DRINKS AT A TIME   Drug use: Not Currently    Types: Marijuana    Comment: HX OF MONTHS AGO   Sexual activity: Never  Other Topics Concern   Not on file  Social History Narrative   Single. 1 dog, 2 cats. Lives alone.       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 Enjoys dinner parties.    Social Drivers of Health   Financial Resource Strain: High Risk (08/09/2023)   Overall Financial Resource Strain (CARDIA)    Difficulty of Paying Living Expenses: Hard  Food Insecurity: Food Insecurity Present (12/28/2023)   Hunger Vital Sign    Worried About Running Out of Food in the Last Year: Sometimes true    Ran Out of Food in the Last Year: Sometimes true  Transportation Needs: Unmet Transportation Needs (12/28/2023)   PRAPARE - Administrator, Civil Service (Medical): Yes    Lack of Transportation (Non-Medical): Yes  Physical Activity: Sufficiently Active (08/09/2023)   Exercise Vital Sign    Days of  Exercise per Week: 7 days    Minutes of Exercise per Session: 30 min  Stress: Stress Concern Present (08/09/2023)   [employer] of Occupational Health - Occupational Stress Questionnaire    Feeling of Stress : To some extent  Social Connections: Patient Declined (12/18/2023)   Social Connection and Isolation Panel    Frequency of Communication with Friends and Family: Patient declined    Frequency of Social Gatherings with Friends and Family: Patient declined    Attends Religious Services: Patient declined    Database Administrator or Organizations: Patient declined  Attends Banker Meetings: Patient declined    Marital Status: Patient declined    Allergies:  Allergies  Allergen Reactions   Other Other (See Comments)    Pollen - dries out right eye    Metabolic Disorder Labs: Lab Results  Component Value Date   HGBA1C 9.9 (H) 12/10/2023   MPG 237.43 12/10/2023   MPG 258 11/06/2023   No results found for: PROLACTIN Lab Results  Component Value Date   CHOL 279 (H) 11/06/2023   TRIG 282 (H) 11/06/2023   HDL 60 11/06/2023   CHOLHDL 4.7 11/06/2023   VLDL 56 (H) 11/06/2023   LDLCALC 163 (H) 11/06/2023   LDLCALC 57 08/25/2023   Lab Results  Component Value Date   TSH 2.337 12/10/2023   TSH 1.028 11/06/2023    Therapeutic Level Labs: No results found for: LITHIUM No results found for: VALPROATE No results found for: CBMZ  Current Medications: Current Outpatient Medications  Medication Sig Dispense Refill   amLODipine  (NORVASC ) 10 MG tablet Take 1 tablet (10 mg total) by mouth daily. 30 tablet 0   ARIPiprazole  (ABILIFY ) 15 MG tablet Take 1 tablet (15 mg total) by mouth at bedtime. 30 tablet 0   atorvastatin  (LIPITOR) 40 MG tablet Take 1 tablet (40 mg total) by mouth daily. 30 tablet 0   buPROPion  (WELLBUTRIN  XL) 150 MG 24 hr tablet Take 1 tablet (150 mg total) by mouth daily. 30 tablet 0   glimepiride (AMARYL) 1 MG tablet Take 5 tablets (5  mg total) by mouth daily with breakfast. 150 tablet 0   hydrOXYzine  (ATARAX ) 25 MG tablet Take 1 tablet (25 mg total) by mouth 3 (three) times daily as needed for anxiety. 30 tablet 0   insulin  glargine (LANTUS) 100 UNIT/ML Solostar Pen Inject 5 Units into the skin daily at 10 pm. (discard pen after 28 days) 9 mL 0   lisinopril  (ZESTRIL ) 40 MG tablet Take 1 tablet (40 mg total) by mouth daily. 30 tablet 0   metFORMIN  (GLUCOPHAGE ) 1000 MG tablet Take 1 tablet (1,000 mg total) by mouth 2 (two) times daily with a meal. 60 tablet 0   pantoprazole  (PROTONIX ) 20 MG tablet Take 1 tablet (20 mg total) by mouth daily. 30 tablet 0   prazosin  (MINIPRESS ) 1 MG capsule Take 1 capsule (1 mg total) by mouth at bedtime. 30 capsule 0   sertraline  (ZOLOFT ) 100 MG tablet Take 2 tablets (200 mg total) by mouth daily. 60 tablet 0   traZODone  (DESYREL ) 50 MG tablet Take 1 tablet (50 mg total) by mouth at bedtime as needed for sleep. 15 tablet 1   No current facility-administered medications for this visit.     Musculoskeletal: Strength & Muscle Tone: within normal limits Gait & Station: normal Patient leans: N/A  Psychiatric Specialty Exam: Review of Systems  There were no vitals taken for this visit.There is no height or weight on file to calculate BMI.  General Appearance: Casual  Eye Contact:  Good  Speech:  Clear and Coherent  Volume:  Normal  Mood:  Anxious and Depressed  Affect:  Congruent  Thought Process:  Coherent  Orientation:  Full (Time, Place, and Person)  Thought Content: Logical   Suicidal Thoughts:  No  Homicidal Thoughts:  No  Memory:  Immediate;   Good Recent;   Good  Judgement:  Good  Insight:  Good  Psychomotor Activity:  Normal  Concentration:  Concentration: Good  Recall:  Good  Fund of Knowledge: Good  Language:  Good  Akathisia:  No  Handed:  Right  AIMS (if indicated): done  Assets:  Communication Skills Desire for Improvement  ADL's:  Intact  Cognition: WNL  Sleep:   Fair   Screenings: AIMS    Flowsheet Row [facility]  AIMS Total Score 0   AUDIT    Flowsheet Row Ad[facility] ([facility] (Dis[facility] Ad[facility] (Discharged) from 11/06/2023 in [facility] Admission (Discharged) [facility] Office Visit from 03/10/2023 in [facility] [place]  Alcohol  Use Disorder Identification Test Final Score (AUDIT) 2 0 0 0 10    GAD-7    Flowsheet Row Counselor from 01/30/2024 in [facility] Counselor from 01/23/2024 in [facility] Counselor from 01/12/2024 in [facility] Visit from [facility]12[facility] Visit from 03/10/2023 in [facility] [place]  Total GAD-7 Score 9 8 3 16 5    PHQ2-9    Flowsheet Row Counselor from 01/30/2024 in [facility] Counselor from 01/24/2024 in [facility] Counselor from 01/23/2024 in [facility] Counselor from 01/12/2024 in [facility] Visit from 01/11/2024 [facility]  PHQ-2 Total Score 1 2 2 2 2   PHQ-9 Total Score 8 11 12 10  --   Flowsheet Row Counselor from 01/24/2024 in [facility] Counselor from 01/12/2024 in [facility] at [facility] Admission (Discharged) [facility]  C-SSRS RISK CATEGORY High Risk High Risk High Risk     Assessment and Plan:  Continue  Partial Hospitalization Programming -Continue medication as directed  Collaboration of Care: Collaboration of Care: Medication Management AEB was refilled at this visit  Patient/Guardian was advised Release of Information must be obtained prior to any record release in order to collaborate their care with an outside provider. Patient/Guardian was advised if they have not already done so to contact the registration department to sign all necessary forms in order for us  to release information regarding their care.   Consent: Patient/Guardian gives verbal consent for treatment and assignment of benefits for services provided during this visit. Patient/Guardian expressed understanding and agreed to proceed.    Staci LOISE Kerns, NP 01/31/2024, 10:29 AM

## 2024-01-30 NOTE — Psych (Signed)
 [facility] PHP THERAPIST PROGRESS NOTE  [name] [id number]   Session Time: 9:00 am - 10:00 am  Participation Level: Active  Behavioral Response: CasualAlertAnxious and Depressed  Type of Therapy: Group Therapy  Treatment Goals addressed: Coping  Progress Towards Goals: Progressing  Interventions: CBT, DBT, Solution Focused, Strength-based, Supportive, and Reframing  Therapist Response: Clinician led check-in regarding current stressors and situation, and review of patient completed daily inventory. Clinician utilized active listening and empathetic response and validated patient emotions. Clinician facilitated processing group on pertinent issues.?   Summary: Patient arrived within time allowed. Patient rates their depression at a 2 and anxiety at a 2 on a scale of 1-10 with 10 being best. Pt reports he is scheduled for a meeting with the local housing coalition this Thursday and he is optimistic about being housed soon. He also shares that he spent his free time [employer] over the weekend. When asked about sleep and appetite, pt reports they slept 2-3 hours last night and ate 3 meals yesterday. Pt denied experiencing SI/SH thoughts and endorsed continued feelings of hopelessness since last session. Pt able to process.?Pt engaged in discussion.?      Session Time: 10:00 am - 11:00 am  Participation Level: Active  Behavioral Response: CasualAlertAnxious and Depressed  Type of Therapy: Group Therapy  Treatment Goals addressed: Coping  Progress Towards Goals: Progressing  Interventions: CBT, DBT, Solution Focused, Strength-based, Supportive, and Reframing  Therapist Response: Clinician led processing group for pt's current struggles. Group members shared stressors and provided support and feedback. Clinician brought in topics of cognitive distortions and automatic negative thoughts to inform discussion.  Summary: Pt able to process and provide support to group.      Session Time: 11:00 am - 12:00 pm  Participation Level: Active  Behavioral Response: CasualAlertAnxious and Depressed  Type of Therapy: Group Therapy  Treatment Goals addressed: Coping  Progress Towards Goals: Progressing  Interventions: CBT, DBT, Solution Focused, Strength-based, Supportive, and Reframing  Therapist Response: Clinician led discussion on cognitive defusion and automatic negative thoughts. Cln reviewed thought defusion techniques utilizing a handout which explains different methods for thought defusion. Lastly, patients were asked to demonstrate cognitive defusion using a silly voice.   Summary: Pt engaged in discussion and demonstrated good insight into the subject matter.   Session Time: 12:00 pm - 12:30 pm  Participation Level: Active  Behavioral Response: CasualAlertAnxious and Depressed  Type of Therapy: Group Therapy  Treatment Goals addressed: Coping  Progress Towards Goals: Progressing  Interventions: Psychologist, Occupational, Supportive  Therapist Response: Reflection Group: Patients encouraged to practice skills and interpersonal techniques or work on mindfulness and relaxation techniques. The importance of self-care and making skills part of a routine to increase usage were stressed.  Summary: Patient engaged and participated appropriately.   Session Time: 12:30 pm - 1:30 pm  Participation Level: Active  Behavioral Response: CasualAlertAnxious and Depressed  Type of Therapy: Group Therapy  Treatment Goals addressed: Coping  Progress Towards Goals: Progressing  Interventions: CBT, DBT, Solution Focused, Strength-based, Supportive, and Reframing  Therapist Response: Group was led by occupational therapist, Edward Hollan.   Summary: Pt engaged and participated in discussion.   Session Time: 1:30 pm - 1:45 pm  Participation Level: Active  Behavioral Response: CasualAlertAnxious and Depressed  Type of Therapy: Group  Therapy  Treatment Goals addressed: Coping  Progress Towards Goals: Progressing  Interventions: CBT, DBT, Solution Focused, Strength-based, Supportive, and Reframing  Therapist Response: Clinician led check-out. Clinician assessed for immediate needs,  medication compliance and efficacy, and safety concerns?  Summary: At check-out, patient contracts for safety.?Patient demonstrates progress as evidenced by his continued engagement and by being receptive to treatment. Patient denies SI/HI/self-harm thoughts at the end of group and agrees to seek help should those thoughts/feelings occur.?    Suicidal/Homicidal: Nowithout intent/plan  Plan: ?Pt will continue in PHP and medication management while continuing to work on decreasing depression symptoms,?SI, and anxiety symptoms,?and increasing the ability to self manage symptoms.     Collaboration of Care: Medication Management AEB Staci Kerns, NP  Patient/Guardian was advised Release of Information must be obtained prior to any record release in order to collaborate their care with an outside provider. Patient/Guardian was advised if they have not already done so to contact the registration department to sign all necessary forms in order for us  to release information regarding their care.   Consent: Patient/Guardian gives verbal consent for treatment and assignment of benefits for services provided during this visit. Patient/Guardian expressed understanding and agreed to proceed.   Diagnosis: No primary diagnosis found.    1. Major depressive disorder, recurrent, severe without psychotic features (HCC)   2. Chronic posttraumatic stress disorder   3. GAD (generalized anxiety disorder)       Will LILLETTE Pollack, LCSW 01/30/2024

## 2024-01-31 ENCOUNTER — Ambulatory Visit (HOSPITAL_COMMUNITY)

## 2024-01-31 ENCOUNTER — Encounter (HOSPITAL_COMMUNITY): Payer: Self-pay | Admitting: Licensed Clinical Social Worker

## 2024-01-31 ENCOUNTER — Ambulatory Visit (INDEPENDENT_AMBULATORY_CARE_PROVIDER_SITE_OTHER): Admitting: Professional

## 2024-01-31 DIAGNOSIS — R4589 Other symptoms and signs involving emotional state: Secondary | ICD-10-CM

## 2024-01-31 DIAGNOSIS — F332 Major depressive disorder, recurrent severe without psychotic features: Secondary | ICD-10-CM

## 2024-01-31 NOTE — Telephone Encounter (Signed)
 See call log

## 2024-01-31 NOTE — Psych (Signed)
 [facility] PHP THERAPIST PROGRESS NOTE  JAYMISON LUBER [id number]   Session Time: 9:00 am - 10:00 am  Participation Level: Active  Behavioral Response: CasualAlertAnxious and Depressed  Type of Therapy: Group Therapy  Treatment Goals addressed: Coping  Progress Towards Goals: Progressing  Interventions: CBT, DBT, Solution Focused, Strength-based, Supportive, and Reframing  Therapist Response: Clinician led check-in regarding current stressors and situation, and review of patient completed daily inventory. Clinician utilized active listening and empathetic response and validated patient emotions. Clinician facilitated processing group on pertinent issues.?   Summary: Patient arrived within time allowed. Patient rates their depression at a 1 and anxiety at a 1 on a scale of 1-10 with 10 being best. When asked about sleep and appetite, pt reports they slept 5 hours last night and ate 2 meals yesterday. Pt denied experiencing SI/SH thoughts and endorses current feelings of hopelessness. Pt reports he struggled with some self-criticism yesterday but feels it was a victory because he did not allow the thoughts to lead to self-loathing. He reports he was able to use some CBT skills to help, including talking to himself in a funny voice. Pt able to process.?Pt engaged in discussion.?      Session Time: 10:00 am - 11:00 am  Participation Level: Active  Behavioral Response: CasualAlertAnxious and Depressed  Type of Therapy: Group Therapy  Treatment Goals addressed: Coping  Progress Towards Goals: Progressing  Interventions: CBT, DBT, Solution Focused, Strength-based, Supportive, and Reframing  Therapist Response: Clinician led processing group for pt's current struggles. Group members shared stressors and provided support and feedback. Clinician brought in topics of power in control and strength in advocating for help.  Summary: Pt able to process and provide support to group.     Session Time: 11:00 am - 12:00 pm   Participation Level: Active   Behavioral Response: Casual Alert and Anxious/Depressed   Type of Therapy: Group Therapy   Treatment Goals addressed: Coping   Progress Towards Goals: Progressing   Interventions: CBT, DBT, Solution Focused, Strength-based, Supportive, and Reframing   Therapist Response: Group was led by [facility] chaplain, Amy [name].   Summary: Pt engaged in discussion.      Session Time: 12:00 pm - 12:30 pm   Participation Level: Active   Behavioral Response: Casual Alert and Anxious/Depressed   Type of Therapy: Group Therapy   Treatment Goals addressed: Coping   Progress Towards Goals: Progressing   Interventions: Psychologist, Occupational, Supportive   Therapist Response: Reflection Group: Patients encouraged to practice skills and interpersonal techniques or work on mindfulness and relaxation techniques. The importance of self-care and making skills part of a routine to increase usage were stressed.   Summary: Pt engaged in discussion.       Session Time: 12:30 pm - 1:30 pm   Participation Level: Active   Behavioral Response: Casual Alert and Anxious/Depressed   Type of Therapy: Group Therapy   Treatment Goals addressed: Coping   Progress Towards Goals: Progressing   Interventions: OT group   Therapist Response: Group was led by occupational therapist, Edward Hollan.    Summary: Pt engaged in discussion.            Session Time: 1:30 pm - 1:45 pm   Participation Level: Active   Behavioral Response: Casual Alert and Anxious/Depressed   Type of Therapy: Group Therapy   Treatment Goals addressed: Coping   Progress Towards Goals: Progressing   Interventions: CBT, DBT, Solution Focused, Strength-based, Supportive, and Reframing   Therapist Response:  1:30- 1:45 pm: Clinician led check-out. Clinician assessed for immediate needs, medication compliance and efficacy, and safety concerns?    Summary: 1:30-1:45pm: At check-out, patient reports no immediate concerns. Patient demonstrates progress as evidenced by engagement and responsiveness to treatment. Patient denies SI/HI/self-harm thoughts at the end of group.   Suicidal/Homicidal: Nowithout intent/plan  Plan: ?Pt will continue in PHP and medication management while continuing to work on decreasing depression symptoms,?SI, and anxiety symptoms,?and increasing the ability to self manage symptoms.     Collaboration of Care: Medication Management AEB Staci Kerns, NP and Other Hildegard Macadam, RN  Patient/Guardian was advised Release of Information must be obtained prior to any record release in order to collaborate their care with an outside provider. Patient/Guardian was advised if they have not already done so to contact the registration department to sign all necessary forms in order for us  to release information regarding their care.   Consent: Patient/Guardian gives verbal consent for treatment and assignment of benefits for services provided during this visit. Patient/Guardian expressed understanding and agreed to proceed.   Diagnosis: Major depressive disorder, recurrent, severe without psychotic features (HCC) [F33.2]    1. Major depressive disorder, recurrent, severe without psychotic features [facility])       [name] JINNY Devoid, [facility] 01/31/2024

## 2024-01-31 NOTE — Progress Notes (Signed)
 Spoke with patient in person for PHP. States that groups are going well. He finds them helpful and enjoyable. Smiling with appropriate affect. On scale 1-10 as 10 being worst he rates depression at 1 and anxiety at 1. Denies SI/HI or AVH. No side effects from medication. No issues or complaints.

## 2024-02-01 ENCOUNTER — Ambulatory Visit (INDEPENDENT_AMBULATORY_CARE_PROVIDER_SITE_OTHER): Admitting: Licensed Clinical Social Worker

## 2024-02-01 ENCOUNTER — Encounter (HOSPITAL_COMMUNITY): Payer: Self-pay

## 2024-02-01 ENCOUNTER — Ambulatory Visit (HOSPITAL_COMMUNITY)

## 2024-02-01 DIAGNOSIS — F332 Major depressive disorder, recurrent severe without psychotic features: Secondary | ICD-10-CM | POA: Diagnosis not present

## 2024-02-01 DIAGNOSIS — F4312 Post-traumatic stress disorder, chronic: Secondary | ICD-10-CM

## 2024-02-01 DIAGNOSIS — F411 Generalized anxiety disorder: Secondary | ICD-10-CM

## 2024-02-01 NOTE — Therapy (Signed)
 [facility] [address] [place], [place], [address] Phone: [phone]   Fax:  [phone]  Occupational Therapy Treatment  Patient Details  Name: [name] MRN: [id number] Date of Birth: 1960/08/01 No data recorded  Encounter Date: 01/25/2024   OT End of Session - 02/01/24 1954     Visit Number 3    Number of Visits 15    Date for Recertification  02/10/24    OT Start Time 1230    OT Stop Time 1330    OT Time Calculation (min) 60 min          Past Medical History:  Diagnosis Date   Cancer (HCC)    melanoma;  right ear   Depression    Diabetes mellitus    Diabetes mellitus, type II (HCC)    GERD (gastroesophageal reflux disease)    Hyperlipidemia    Hypertension    Metabolic syndrome    Obesity    PONV (postoperative nausea and vomiting)    YRS AGO WITHER ETHER    Past Surgical History:  Procedure Laterality Date   COLONOSCOPY  05/01/2013   CYST REMOVAL TRUNK  1989   benign   MELANOMA EXCISION  2008   right ear and neck   ORIF HUMERUS FRACTURE Left 12/23/2020   Procedure: OPEN REDUCTION INTERNAL FIXATION (ORIF) PROXIMAL HUMERUS FRACTURE;  Surgeon: [name], MD;  Location: [facility];  Service: Orthopedics;  Laterality: Left;    POLYPECTOMY     reconstructive plastic surgery face  1967   dog bite at age 45; about 15 surgeries for 10 years   TONSILLECTOMY  1966   age 63    There were no vitals filed for this visit.   Subjective Assessment - 02/01/24 1954     Currently in Pain? No/denies    Pain Score 0-No pain             Group Session:  S: Doing better today I feel.   O: During the group therapy session, the occupational therapist discussed the impact of sleep disturbances on daily activities and overall health and wellbeing.   The OT also reviewed various types of sleep disorders, including insomnia, sleep apnea, restless leg syndrome, and narcolepsy, and their associated symptoms. Strategies  for managing and treating sleep disturbances were also discussed, such as establishing a consistent sleep routine, avoiding stimulants before bedtime, and engaging in relaxation techniques.   Today's group also included information on how sleep disturbances can cause fatigue, mood changes, cognitive impairment, and physical health problems, and emphasizes the importance of seeking prompt treatment to maintain overall health and wellbeing.   A: In today's session, the patient demonstrated active engagement with the topic of The Importance of Sleep. They eagerly asked questions, contributed personal experiences, and showcased a noticeable eagerness to apply the discussed principles. Their participation indicated not only a strong understanding of the subject matter but also an intrinsic motivation to implement better sleep practices in their daily routine. Based on their proactive involvement, it is assessed that the patient greatly benefited from today's treatment and will likely make efforts to incorporate the insights gained.    P: Continue to attend PHP OT group sessions 5x week for 4 weeks to promote daily structure, social engagement, and opportunities to develop and utilize adaptive strategies to maximize functional performance in preparation for safe transition and integration back into school, work, and the community. Plan to address topic of tbd in next OT group session.  OT Education - 02/01/24 1954     Education Details Sleep           OT Short Term Goals - 01/25/24 2132       OT SHORT TERM GOAL #1   Title By the time of discharge, client will independently set, track, and make progress towards a long-term goal, demonstrating resilience in overcoming obstacles and seeking support when needed.    Time 3    Period Weeks    Status On-going    Target Date 02/10/24      OT SHORT TERM GOAL #2   Title Client will independently identify and modify  three areas of the current routine that contribute to increased stress or dysfunction by the end of therapy.    Status On-going      OT SHORT TERM GOAL #3   Title Client will independently identify and list three personal triggers that lead to heightened stress or negative emotional responses by the end of 3 sessions.    Status On-going      OT SHORT TERM GOAL #4   Title Client will demonstrate the use of at least two different relaxation techniques (e.g., deep breathing, progressive muscle relaxation) without prompting when faced with a stressful situation in four out of five trials.    Status On-going                   Plan - 02/01/24 1954     Psychosocial Skills Interpersonal Interaction;Habits;Environmental  Adaptations;Coping Strategies;Routines and Behaviors          Patient will benefit from skilled therapeutic intervention in order to improve the following deficits and impairments:       Psychosocial Skills: Interpersonal Interaction, Habits, Environmental  Adaptations, Coping Strategies, Routines and Behaviors   Visit Diagnosis: Difficulty coping    Problem List Patient Active Problem List   Diagnosis Date Noted   Major depressive disorder, recurrent, severe without psychotic features (HCC) 12/28/2023   Major depressive disorder, recurrent episode, severe, with psychotic behavior (HCC) 11/06/2023   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 05/11/2023   Severe alcohol  use disorder (HCC) 05/09/2023   Treatment-resistant depression 04/25/2023   Chronic posttraumatic stress disorder 09/27/2022   Cannabis abuse 09/27/2022   Dyslipidemia 09/02/2021   S/P ORIF (open reduction internal fixation) fracture 12/23/2020   Eczema 12/02/2015   Hypertension associated with diabetes (HCC) 03/04/2015   GERD (gastroesophageal reflux disease) 03/04/2015   Class 3 severe obesity due to excess calories with serious comorbidity and body mass index (BMI) of 40.0 to 44.9 in adult (HCC)  12/13/2008   Type 2 diabetes mellitus without complication, with long-term current use of insulin  (HCC) 03/05/2008   History of melanoma. R ear.  12/16/2006   Major depression, recurrent, full remission 12/16/2006   Hyperlipidemia associated with type 2 diabetes mellitus (HCC) 11/07/2006    Dallas KANDICE Purpura, OT 02/01/2024, 7:54 PM  Dallas Purpura, OT   [facility] [address] [place], [place], [address] Phone: [phone]   Fax:  [phone]  Name: [name] MRN: [id number] Date of Birth: Jul 07, 1960

## 2024-02-02 ENCOUNTER — Ambulatory Visit (HOSPITAL_COMMUNITY)

## 2024-02-03 ENCOUNTER — Ambulatory Visit (HOSPITAL_COMMUNITY)

## 2024-02-03 ENCOUNTER — Ambulatory Visit (INDEPENDENT_AMBULATORY_CARE_PROVIDER_SITE_OTHER): Admitting: Licensed Clinical Social Worker

## 2024-02-03 DIAGNOSIS — R4589 Other symptoms and signs involving emotional state: Secondary | ICD-10-CM

## 2024-02-03 DIAGNOSIS — F411 Generalized anxiety disorder: Secondary | ICD-10-CM

## 2024-02-03 DIAGNOSIS — F332 Major depressive disorder, recurrent severe without psychotic features: Secondary | ICD-10-CM

## 2024-02-03 DIAGNOSIS — F4312 Post-traumatic stress disorder, chronic: Secondary | ICD-10-CM

## 2024-02-06 ENCOUNTER — Encounter (HOSPITAL_COMMUNITY): Payer: Self-pay

## 2024-02-06 ENCOUNTER — Ambulatory Visit (HOSPITAL_COMMUNITY)

## 2024-02-06 ENCOUNTER — Ambulatory Visit (INDEPENDENT_AMBULATORY_CARE_PROVIDER_SITE_OTHER): Admitting: Licensed Clinical Social Worker

## 2024-02-06 DIAGNOSIS — F332 Major depressive disorder, recurrent severe without psychotic features: Secondary | ICD-10-CM | POA: Diagnosis not present

## 2024-02-06 DIAGNOSIS — R4589 Other symptoms and signs involving emotional state: Secondary | ICD-10-CM

## 2024-02-06 NOTE — Psych (Signed)
  [facility] PHP THERAPIST PROGRESS NOTE  SAMUL MCINROY [id number]   Session Time: 9:00 am - 10:00 am  Participation Level: Active  Behavioral Response: CasualAlertAnxious and Depressed  Type of Therapy: Group Therapy  Treatment Goals addressed: Coping  Progress Towards Goals: Progressing  Interventions: CBT, DBT, Solution Focused, Strength-based, Supportive, and Reframing  Therapist Response: Clinician led check-in regarding current stressors and situation, and review of patient completed daily inventory. Clinician utilized active listening and empathetic response and validated patient emotions. Clinician facilitated processing group on pertinent issues.?   Summary: Patient arrived within time allowed. Patient rates their depression at a 1 and anxiety at a 1 on a scale of 1-10 with 10 being best. Pt reports a friend is allowing him to stay at their home while he is in the process of obtaining housing, which pt states is moving along. When asked about sleep and appetite, pt reports they slept 8 hours last night and ate 3 meals yesterday. Pt denied experiencing SI/SH thoughts and endorsed the usual feelings of hopelessness since last session. Pt able to process.?Pt engaged in discussion.?      Session Time: 10:00 am - 11:00 am  Participation Level: Active  Behavioral Response: CasualAlertAnxious and Depressed  Type of Therapy: Group Therapy  Treatment Goals addressed: Coping  Progress Towards Goals: Progressing  Interventions: CBT, DBT, Solution Focused, Strength-based, Supportive, and Reframing  Therapist Response: Clinician led processing group for pt's current struggles. Group members shared stressors and provided support and feedback. Clinician brought in topics of boundaries, giving oneself credit for progress, self-care, and possible trauma responses to yelling to inform discussion.  Summary: Pt able to process and provide support to group.     Session Time: 11:00 am  - 12:00 pm  Participation Level: Active  Behavioral Response: CasualAlertAnxious and Depressed  Type of Therapy: Group Therapy  Treatment Goals addressed: Coping  Progress Towards Goals: Progressing  Interventions: CBT, DBT, Solution Focused, Strength-based, Supportive, and Reframing  Therapist Response: Group was led by [name] Devoid and co-led by Will Pollack. Clinician introduced topic of Positive Psychology. Group watched Positive Psychology Ted-Talk. Patients discussed how their lens of life affects the way they feel.   Summary: Pt engaged in discussion. Pt requested to leave at break due to feeling poorly.   Suicidal/Homicidal: Nowithout intent/plan  Plan: ?Pt will continue in PHP and medication management while continuing to work on decreasing depression symptoms,?SI, and anxiety symptoms,?and increasing the ability to self manage symptoms.     Collaboration of Care: Medication Management AEB Staci Kerns, NP  Patient/Guardian was advised Release of Information must be obtained prior to any record release in order to collaborate their care with an outside provider. Patient/Guardian was advised if they have not already done so to contact the registration department to sign all necessary forms in order for us  to release information regarding their care.   Consent: Patient/Guardian gives verbal consent for treatment and assignment of benefits for services provided during this visit. Patient/Guardian expressed understanding and agreed to proceed.   Diagnosis: Major depressive disorder, recurrent, severe without psychotic features (HCC) [F33.2]    1. Major depressive disorder, recurrent, severe without psychotic features [facility])       Will LILLETTE Pollack, LCSW 02/06/2024

## 2024-02-06 NOTE — Therapy (Signed)
 [facility] [address] [place], [place], [address] Phone: [phone]   Fax:  [phone]  Occupational Therapy Treatment  Patient Details  Name: [name] MRN: [id number] Date of Birth: 07-10-1960 No data recorded  Encounter Date: 01/26/2024   OT End of Session - 02/06/24 2104     Visit Number 4    Number of Visits 15    Date for Recertification  02/10/24    OT Start Time 1230    OT Stop Time 1330    OT Time Calculation (min) 60 min          Past Medical History:  Diagnosis Date   Cancer (HCC)    melanoma;  right ear   Depression    Diabetes mellitus    Diabetes mellitus, type II (HCC)    GERD (gastroesophageal reflux disease)    Hyperlipidemia    Hypertension    Metabolic syndrome    Obesity    PONV (postoperative nausea and vomiting)    YRS AGO WITHER ETHER    Past Surgical History:  Procedure Laterality Date   COLONOSCOPY  05/01/2013   CYST REMOVAL TRUNK  1989   benign   MELANOMA EXCISION  2008   right ear and neck   ORIF HUMERUS FRACTURE Left 12/23/2020   Procedure: OPEN REDUCTION INTERNAL FIXATION (ORIF) PROXIMAL HUMERUS FRACTURE;  Surgeon: [name], MD;  Location: [facility];  Service: Orthopedics;  Laterality: Left;    POLYPECTOMY     reconstructive plastic surgery face  1967   dog bite at age 63; about 15 surgeries for 10 years   TONSILLECTOMY  1966   age 63    There were no vitals filed for this visit.   Subjective Assessment - 02/06/24 2104     Currently in Pain? No/denies    Pain Score 0-No pain            Group Session:  S: Doing better today.   O: During the group therapy session, the occupational therapist discussed the impact of sleep disturbances on daily activities and overall health and wellbeing.   The OT also reviewed various types of sleep disorders, including insomnia, sleep apnea, restless leg syndrome, and narcolepsy, and their associated symptoms. Strategies for  managing and treating sleep disturbances were also discussed, such as establishing a consistent sleep routine, avoiding stimulants before bedtime, and engaging in relaxation techniques.   Today's group also included information on how sleep disturbances can cause fatigue, mood changes, cognitive impairment, and physical health problems, and emphasizes the importance of seeking prompt treatment to maintain overall health and wellbeing.   A: In today's session, the patient demonstrated active engagement with the topic of The Importance of Sleep. They eagerly asked questions, contributed personal experiences, and showcased a noticeable eagerness to apply the discussed principles. Their participation indicated not only a strong understanding of the subject matter but also an intrinsic motivation to implement better sleep practices in their daily routine. Based on their proactive involvement, it is assessed that the patient greatly benefited from today's treatment and will likely make efforts to incorporate the insights gained.    P: Continue to attend PHP OT group sessions 5x week for 3 weeks to promote daily structure, social engagement, and opportunities to develop and utilize adaptive strategies to maximize functional performance in preparation for safe transition and integration back into school, work, and the community. Plan to address topic of tbd in next OT group session.  OT Education - 02/06/24 2104     Education Details Sleep           OT Short Term Goals - 01/25/24 2132       OT SHORT TERM GOAL #1   Title By the time of discharge, client will independently set, track, and make progress towards a long-term goal, demonstrating resilience in overcoming obstacles and seeking support when needed.    Time 3    Period Weeks    Status On-going    Target Date 02/10/24      OT SHORT TERM GOAL #2   Title Client will independently identify and modify three  areas of the current routine that contribute to increased stress or dysfunction by the end of therapy.    Status On-going      OT SHORT TERM GOAL #3   Title Client will independently identify and list three personal triggers that lead to heightened stress or negative emotional responses by the end of 3 sessions.    Status On-going      OT SHORT TERM GOAL #4   Title Client will demonstrate the use of at least two different relaxation techniques (e.g., deep breathing, progressive muscle relaxation) without prompting when faced with a stressful situation in four out of five trials.    Status On-going                   Plan - 02/06/24 2104     Psychosocial Skills Interpersonal Interaction;Habits;Environmental  Adaptations;Coping Strategies;Routines and Behaviors          Patient will benefit from skilled therapeutic intervention in order to improve the following deficits and impairments:       Psychosocial Skills: Interpersonal Interaction, Habits, Environmental  Adaptations, Coping Strategies, Routines and Behaviors   Visit Diagnosis: Difficulty coping    Problem List Patient Active Problem List   Diagnosis Date Noted   Major depressive disorder, recurrent, severe without psychotic features (HCC) 12/28/2023   Major depressive disorder, recurrent episode, severe, with psychotic behavior (HCC) 11/06/2023   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 05/11/2023   Severe alcohol  use disorder (HCC) 05/09/2023   Treatment-resistant depression 04/25/2023   Chronic posttraumatic stress disorder 09/27/2022   Cannabis abuse 09/27/2022   Dyslipidemia 09/02/2021   S/P ORIF (open reduction internal fixation) fracture 12/23/2020   Eczema 12/02/2015   Hypertension associated with diabetes (HCC) 03/04/2015   GERD (gastroesophageal reflux disease) 03/04/2015   Class 3 severe obesity due to excess calories with serious comorbidity and body mass index (BMI) of 40.0 to 44.9 in adult (HCC)  12/13/2008   Type 2 diabetes mellitus without complication, with long-term current use of insulin  (HCC) 03/05/2008   History of melanoma. R ear.  12/16/2006   Major depression, recurrent, full remission 12/16/2006   Hyperlipidemia associated with type 2 diabetes mellitus (HCC) 11/07/2006    Dallas KANDICE Purpura, OT 02/06/2024, 9:05 PM   Dallas Purpura, OT   [facility] [address] [place], [place], [address] Phone: [phone]   Fax:  [phone]  Name: [name] MRN: [id number] Date of Birth: 1960-07-09

## 2024-02-07 ENCOUNTER — Ambulatory Visit (INDEPENDENT_AMBULATORY_CARE_PROVIDER_SITE_OTHER): Admitting: Professional

## 2024-02-07 ENCOUNTER — Ambulatory Visit (HOSPITAL_COMMUNITY)

## 2024-02-07 ENCOUNTER — Ambulatory Visit (HOSPITAL_COMMUNITY): Admitting: Licensed Clinical Social Worker

## 2024-02-07 ENCOUNTER — Encounter (HOSPITAL_COMMUNITY): Payer: Self-pay

## 2024-02-07 DIAGNOSIS — F332 Major depressive disorder, recurrent severe without psychotic features: Secondary | ICD-10-CM

## 2024-02-07 DIAGNOSIS — R4589 Other symptoms and signs involving emotional state: Secondary | ICD-10-CM

## 2024-02-07 NOTE — Progress Notes (Signed)
 Spoke with patient in person for PHP. States that Sunday he moved in with a friend temporarily. States he was so happy to sleep in a bed again. Has been sleeping in his car for a month while homeless. Sleep has improved since being in a bed. He goes for an interview soon for subsidized housing. He looks forward to that. 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 Feels worthless at times and knows that its always going to be a struggle for himself. States that groups have helped him a lot. On scale 1-10 as 10 being worst he rates depression at 1 and anxiety at 1. Denies SI/HI or AVH. PHQ9=7. No issues or complaints. No side effects with medications.

## 2024-02-07 NOTE — Psych (Signed)
 [facility] PHP THERAPIST PROGRESS NOTE  SIRE POET [id number]   Session Time: 9:00 am - 10:00 am  Participation Level: Active  Behavioral Response: CasualAlertAnxious and Depressed  Type of Therapy: Group Therapy  Treatment Goals addressed: Coping  Progress Towards Goals: Progressing  Interventions: CBT, DBT, Solution Focused, Strength-based, Supportive, and Reframing  Therapist Response: Clinician led check-in regarding current stressors and situation, and review of patient completed daily inventory. Clinician utilized active listening and empathetic response and validated patient emotions. Clinician facilitated processing group on pertinent issues.?   Summary: Patient arrived within time allowed. Patient rates their depression at a 1 and anxiety at a 1 on a scale of 1-10 with 10 being best. When asked about sleep and appetite, pt reports they slept 10 hours last night and ate 1x yesterday. Pt denied experiencing SI/SH thoughts and endorses current feelings of hopelessness, stating the usual. Pt reports he is sleeping better since moving in with family friends and is hopeful he will have his own space soon. He reports he rested yesterday afternoon after having stomach issues. Pt able to process.?Pt engaged in discussion.?      Session Time: 10:00 am - 11:00 am  Participation Level: Active  Behavioral Response: CasualAlertAnxious and Depressed  Type of Therapy: Group Therapy  Treatment Goals addressed: Coping  Progress Towards Goals: Progressing  Interventions: CBT, DBT, Solution Focused, Strength-based, Supportive, and Reframing  Therapist Response: Clinician led processing group for pt's current struggles. Group members shared stressors and provided support and feedback. Clinician brought in topics of black/white thinking, change being difficult and rewarding, and what does right look like to each person at different times.  Summary: Pt able to process and provide  support to group.    Session Time: 11:00 am - 12:00 pm   Participation Level: Active   Behavioral Response: Casual Alert and Anxious/Depressed   Type of Therapy: Group Therapy   Treatment Goals addressed: Coping   Progress Towards Goals: Progressing   Interventions: CBT, DBT, Solution Focused, Strength-based, Supportive, and Reframing   Therapist Response: Group was led by [facility] chaplain, Amy [name].   Summary: Pt engaged in discussion.      Session Time: 12:00 pm - 12:30 pm   Participation Level: Active   Behavioral Response: Casual Alert and Anxious/Depressed   Type of Therapy: Group Therapy   Treatment Goals addressed: Coping   Progress Towards Goals: Progressing   Interventions: Psychologist, Occupational, Supportive   Therapist Response: Reflection Group: Patients encouraged to practice skills and interpersonal techniques or work on mindfulness and relaxation techniques. The importance of self-care and making skills part of a routine to increase usage were stressed.   Summary: Pt engaged in discussion.       Session Time: 12:30 pm - 1:30 pm   Participation Level: Active   Behavioral Response: Casual Alert and Anxious/Depressed   Type of Therapy: Group Therapy   Treatment Goals addressed: Coping   Progress Towards Goals: Progressing   Interventions: OT group   Therapist Response: Group was led by occupational therapist, Edward Hollan.    Summary: Pt engaged in discussion.         Session Time: 1:30 pm - 1:45 pm   Participation Level: Active   Behavioral Response: CasualAlertEuthymic   Type of Therapy: Group Therapy   Treatment Goals addressed: Coping   Progress Towards Goals: Progressing   Interventions: CBT, DBT, Solution Focused, Strength-based, Supportive, and Reframing   Therapist Response: 1:30-1:45pm: Clinician led check-out. Clinician assessed for  immediate needs, medication compliance and efficacy, and safety concerns?    Summary: 1:30-1:45pm: At check-out, patient reports no immediate concerns. Patient demonstrates progress as evidenced by engagement and responsiveness to treatment. Patient denies SI/HI/self-harm thoughts at the end of group.  Suicidal/Homicidal: Nowithout intent/plan  Plan: ?Pt will continue in PHP and medication management while continuing to work on decreasing depression symptoms,?SI, and anxiety symptoms,?and increasing the ability to self manage symptoms.     Collaboration of Care: Medication Management AEB Staci Kerns, NP and Other Hildegard Macadam, RN  Patient/Guardian was advised Release of Information must be obtained prior to any record release in order to collaborate their care with an outside provider. Patient/Guardian was advised if they have not already done so to contact the registration department to sign all necessary forms in order for us  to release information regarding their care.   Consent: Patient/Guardian gives verbal consent for treatment and assignment of benefits for services provided during this visit. Patient/Guardian expressed understanding and agreed to proceed.   Diagnosis: Major depressive disorder, recurrent, severe without psychotic features (HCC) [F33.2]    1. Major depressive disorder, recurrent, severe without psychotic features [facility])       [name] JINNY Devoid, [facility] 02/07/2024

## 2024-02-07 NOTE — Therapy (Signed)
 [facility] [address] [place], [place], [address] Phone: [phone]   Fax:  [phone]  Occupational Therapy Treatment  Patient Details  Name: [name] MRN: [id number] Date of Birth: [birth year] No data recorded  Encounter Date: 01/27/2024   OT End of Session - 02/07/24 1144     Visit Number 5    Number of Visits 15    Date for Recertification  02/10/24    OT Start Time 1200    OT Stop Time 1250    OT Time Calculation (min) 50 min          Past Medical History:  Diagnosis Date   Cancer (HCC)    melanoma;  right ear   Depression    Diabetes mellitus    Diabetes mellitus, type II (HCC)    GERD (gastroesophageal reflux disease)    Hyperlipidemia    Hypertension    Metabolic syndrome    Obesity    PONV (postoperative nausea and vomiting)    YRS AGO WITHER ETHER    Past Surgical History:  Procedure Laterality Date   COLONOSCOPY  05/01/2013   CYST REMOVAL TRUNK  1989   benign   MELANOMA EXCISION  2008   right ear and neck   ORIF HUMERUS FRACTURE Left 12/23/2020   Procedure: OPEN REDUCTION INTERNAL FIXATION (ORIF) PROXIMAL HUMERUS FRACTURE;  Surgeon: [name], MD;  Location: [facility];  Service: Orthopedics;  Laterality: Left;    POLYPECTOMY     reconstructive plastic surgery face  1967   dog bite at age 89; about 15 surgeries for 10 years   TONSILLECTOMY  1966   age 39    There were no vitals filed for this visit.   Subjective Assessment - 02/07/24 1144     Currently in Pain? No/denies    Pain Score 0-No pain             Group Session:  S: Doing better today. Slept in a bed last night so I was able to get more than eight hours total. Feels really good today.   O: The primary objective of this topic is to explore and understand the concept of occupational balance in the context of daily living. The term occupational balance is defined broadly, encompassing all activities that occupy an  individual's time and energy, including self-care, leisure, and work-related tasks. The goal is to guide participants towards achieving a harmonious blend of these activities, tailored to their personal values and life circumstances. This balance is aimed at enhancing overall well-being, not by equally distributing time across activities, but by ensuring that daily engagements are fulfilling and not draining. The content delves into identifying various barriers that individuals face in achieving occupational balance, such as overcommitment, misaligned priorities, external pressures, and lack of effective time management. The impact of these barriers on occupational performance, roles, and lifestyles is examined, highlighting issues like reduced efficiency, strained relationships, and potential health problems. Strategies for cultivating occupational balance are a key focus. These strategies include practical methods like time blocking, prioritizing tasks, establishing self-care rituals, decluttering, connecting with nature, and engaging in reflective practices. These approaches are designed to be adaptable and applicable to a wide range of life scenarios, promoting a proactive and mindful approach to daily living. The overall aim is to equip participants with the knowledge and tools to create a balanced lifestyle that supports their mental, emotional, and physical health, thereby improving their functional performance in daily life.   A:  The patient demonstrated a high level of engagement and active participation throughout the session on occupational balance. The patient frequently contributed to discussions, offering insightful reflections on personal experiences related to the barriers and strategies for achieving occupational balance. There was a clear understanding of the concept and an ability to relate it to their own life. The patient showed enthusiasm in learning and applying the strategies  discussed, such as time blocking and self-care rituals, indicating a strong motivation to improve their occupational balance. The patient's proactive approach and responsiveness to the topic suggest a high potential for implementing these strategies effectively in their daily routine.    P: Continue to attend PHP OT group sessions 5x week for 3 weeks to promote daily structure, social engagement, and opportunities to develop and utilize adaptive strategies to maximize functional performance in preparation for safe transition and integration back into school, work, and the community. Plan to address topic of pt 2 in next OT group session.                   OT Education - 02/07/24 1144     Education Details Occupational Balance           OT Short Term Goals - 01/25/24 2132       OT SHORT TERM GOAL #1   Title By the time of discharge, client will independently set, track, and make progress towards a long-term goal, demonstrating resilience in overcoming obstacles and seeking support when needed.    Time 3    Period Weeks    Status On-going    Target Date 02/10/24      OT SHORT TERM GOAL #2   Title Client will independently identify and modify three areas of the current routine that contribute to increased stress or dysfunction by the end of therapy.    Status On-going      OT SHORT TERM GOAL #3   Title Client will independently identify and list three personal triggers that lead to heightened stress or negative emotional responses by the end of 3 sessions.    Status On-going      OT SHORT TERM GOAL #4   Title Client will demonstrate the use of at least two different relaxation techniques (e.g., deep breathing, progressive muscle relaxation) without prompting when faced with a stressful situation in four out of five trials.    Status On-going                   Plan - 02/07/24 1145     Psychosocial Skills Interpersonal Interaction;Habits;Environmental   Adaptations;Coping Strategies;Routines and Behaviors          Patient will benefit from skilled therapeutic intervention in order to improve the following deficits and impairments:       Psychosocial Skills: Interpersonal Interaction, Habits, Environmental  Adaptations, Coping Strategies, Routines and Behaviors   Visit Diagnosis: Difficulty coping    Problem List Patient Active Problem List   Diagnosis Date Noted   Major depressive disorder, recurrent, severe without psychotic features (HCC) 12/28/2023   Major depressive disorder, recurrent episode, severe, with psychotic behavior (HCC) 11/06/2023   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 05/11/2023   Severe alcohol  use disorder (HCC) 05/09/2023   Treatment-resistant depression 04/25/2023   Chronic posttraumatic stress disorder 09/27/2022   Cannabis abuse 09/27/2022   Dyslipidemia 09/02/2021   S/P ORIF (open reduction internal fixation) fracture 12/23/2020   Eczema 12/02/2015   Hypertension associated with diabetes (HCC) 03/04/2015   GERD (gastroesophageal reflux  disease) 03/04/2015   Class 3 severe obesity due to excess calories with serious comorbidity and body mass index (BMI) of 40.0 to 44.9 in adult [facility]) 12/13/2008   Type 2 diabetes mellitus without complication, with long-term current use of insulin  (HCC) 03/05/2008   History of melanoma. R ear.  12/16/2006   Major depression, recurrent, full remission 12/16/2006   Hyperlipidemia associated with type 2 diabetes mellitus (HCC) 11/07/2006    Dallas KANDICE Purpura, OT 02/07/2024, 11:45 AM  Dallas Purpura, OT    [place] [facility] [address] [place], [place], [address] Phone: [phone]   Fax:  [phone]  Name: [name] MRN: [id number] Date of Birth: 30-Aug-1960

## 2024-02-08 ENCOUNTER — Ambulatory Visit (HOSPITAL_COMMUNITY)

## 2024-02-09 ENCOUNTER — Ambulatory Visit (INDEPENDENT_AMBULATORY_CARE_PROVIDER_SITE_OTHER): Admitting: Licensed Clinical Social Worker

## 2024-02-09 ENCOUNTER — Ambulatory Visit (HOSPITAL_COMMUNITY)

## 2024-02-09 ENCOUNTER — Encounter (HOSPITAL_COMMUNITY): Payer: Self-pay

## 2024-02-09 DIAGNOSIS — F332 Major depressive disorder, recurrent severe without psychotic features: Secondary | ICD-10-CM

## 2024-02-09 DIAGNOSIS — F4312 Post-traumatic stress disorder, chronic: Secondary | ICD-10-CM

## 2024-02-09 DIAGNOSIS — R4589 Other symptoms and signs involving emotional state: Secondary | ICD-10-CM

## 2024-02-09 NOTE — Therapy (Signed)
 [facility] [address] [place], [place], [address] Phone: [phone]   Fax:  [phone]  Occupational Therapy Treatment  Patient Details  Name: TRELYN VANDERLINDE MRN: [id number] Date of Birth: [birth year] No data recorded  Encounter Date: 01/30/2024   OT End of Session - 02/09/24 2034     Visit Number 6    Number of Visits 15    Date for Recertification  02/10/24    OT Start Time 1230    OT Stop Time 1330    OT Time Calculation (min) 60 min          Past Medical History:  Diagnosis Date   Cancer (HCC)    melanoma;  right ear   Depression    Diabetes mellitus    Diabetes mellitus, type II (HCC)    GERD (gastroesophageal reflux disease)    Hyperlipidemia    Hypertension    Metabolic syndrome    Obesity    PONV (postoperative nausea and vomiting)    YRS AGO WITHER ETHER    Past Surgical History:  Procedure Laterality Date   COLONOSCOPY  05/01/2013   CYST REMOVAL TRUNK  1989   benign   MELANOMA EXCISION  2008   right ear and neck   ORIF HUMERUS FRACTURE Left 12/23/2020   Procedure: OPEN REDUCTION INTERNAL FIXATION (ORIF) PROXIMAL HUMERUS FRACTURE;  Surgeon: [name], MD;  Location: [facility];  Service: Orthopedics;  Laterality: Left;    POLYPECTOMY     reconstructive plastic surgery face  1967   dog bite at age 63; about 15 surgeries for 10 years   TONSILLECTOMY  1966   age 63    There were no vitals filed for this visit.   Subjective Assessment - 02/09/24 2033     Currently in Pain? No/denies    Pain Score 0-No pain             Group Session:  S: Doing better today.   O: The primary objective of this topic is to explore and understand the concept of occupational balance in the context of daily living. The term occupational balance is defined broadly, encompassing all activities that occupy an individual's time and energy, including self-care, leisure, and work-related tasks. The goal is to guide  participants towards achieving a harmonious blend of these activities, tailored to their personal values and life circumstances. This balance is aimed at enhancing overall well-being, not by equally distributing time across activities, but by ensuring that daily engagements are fulfilling and not draining. The content delves into identifying various barriers that individuals face in achieving occupational balance, such as overcommitment, misaligned priorities, external pressures, and lack of effective time management. The impact of these barriers on occupational performance, roles, and lifestyles is examined, highlighting issues like reduced efficiency, strained relationships, and potential health problems. Strategies for cultivating occupational balance are a key focus. These strategies include practical methods like time blocking, prioritizing tasks, establishing self-care rituals, decluttering, connecting with nature, and engaging in reflective practices. These approaches are designed to be adaptable and applicable to a wide range of life scenarios, promoting a proactive and mindful approach to daily living. The overall aim is to equip participants with the knowledge and tools to create a balanced lifestyle that supports their mental, emotional, and physical health, thereby improving their functional performance in daily life.   A:  The patient demonstrated a high level of engagement and active participation throughout the session on occupational balance. The patient frequently  contributed to discussions, offering insightful reflections on personal experiences related to the barriers and strategies for achieving occupational balance. There was a clear understanding of the concept and an ability to relate it to their own life. The patient showed enthusiasm in learning and applying the strategies discussed, such as time blocking and self-care rituals, indicating a strong motivation to improve their  occupational balance. The patient's proactive approach and responsiveness to the topic suggest a high potential for implementing these strategies effectively in their daily routine.    P: Continue to attend PHP OT group sessions 5x week for 3 weeks to promote daily structure, social engagement, and opportunities to develop and utilize adaptive strategies to maximize functional performance in preparation for safe transition and integration back into school, work, and the community. Plan to address topic of tbd in next OT group session.                   OT Education - 02/09/24 2033     Education Details Occupational Balance           OT Short Term Goals - 01/25/24 2132       OT SHORT TERM GOAL #1   Title By the time of discharge, client will independently set, track, and make progress towards a long-term goal, demonstrating resilience in overcoming obstacles and seeking support when needed.    Time 3    Period Weeks    Status On-going    Target Date 02/10/24      OT SHORT TERM GOAL #2   Title Client will independently identify and modify three areas of the current routine that contribute to increased stress or dysfunction by the end of therapy.    Status On-going      OT SHORT TERM GOAL #3   Title Client will independently identify and list three personal triggers that lead to heightened stress or negative emotional responses by the end of 3 sessions.    Status On-going      OT SHORT TERM GOAL #4   Title Client will demonstrate the use of at least two different relaxation techniques (e.g., deep breathing, progressive muscle relaxation) without prompting when faced with a stressful situation in four out of five trials.    Status On-going                   Plan - 02/09/24 2034     Psychosocial Skills Interpersonal Interaction;Habits;Environmental  Adaptations;Coping Strategies;Routines and Behaviors          Patient will benefit from skilled  therapeutic intervention in order to improve the following deficits and impairments:       Psychosocial Skills: Interpersonal Interaction, Habits, Environmental  Adaptations, Coping Strategies, Routines and Behaviors   Visit Diagnosis: Difficulty coping    Problem List Patient Active Problem List   Diagnosis Date Noted   Major depressive disorder, recurrent, severe without psychotic features (HCC) 12/28/2023   Major depressive disorder, recurrent episode, severe, with psychotic behavior (HCC) 11/06/2023   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 05/11/2023   Severe alcohol  use disorder (HCC) 05/09/2023   Treatment-resistant depression 04/25/2023   Chronic posttraumatic stress disorder 09/27/2022   Cannabis abuse 09/27/2022   Dyslipidemia 09/02/2021   S/P ORIF (open reduction internal fixation) fracture 12/23/2020   Eczema 12/02/2015   Hypertension associated with diabetes (HCC) 03/04/2015   GERD (gastroesophageal reflux disease) 03/04/2015   Class 3 severe obesity due to excess calories with serious comorbidity and body mass index (BMI) of  40.0 to 44.9 in adult [facility]) 12/13/2008   Type 2 diabetes mellitus without complication, with long-term current use of insulin  (HCC) 03/05/2008   History of melanoma. R ear.  12/16/2006   Major depression, recurrent, full remission 12/16/2006   Hyperlipidemia associated with type 2 diabetes mellitus (HCC) 11/07/2006    Dallas KANDICE Purpura, OT 02/09/2024, 8:35 PM  Dallas Purpura, OT   [facility] [address] [place], [place], [address] Phone: [phone]   Fax:  [phone]  Name: MOODY ROBBEN MRN: [id number] Date of Birth: 05-26-1960

## 2024-02-10 ENCOUNTER — Ambulatory Visit (HOSPITAL_COMMUNITY)

## 2024-02-10 ENCOUNTER — Ambulatory Visit (HOSPITAL_COMMUNITY): Admitting: Licensed Clinical Social Worker

## 2024-02-10 DIAGNOSIS — F332 Major depressive disorder, recurrent severe without psychotic features: Secondary | ICD-10-CM

## 2024-02-10 DIAGNOSIS — F4312 Post-traumatic stress disorder, chronic: Secondary | ICD-10-CM

## 2024-02-10 DIAGNOSIS — R4589 Other symptoms and signs involving emotional state: Secondary | ICD-10-CM

## 2024-02-10 NOTE — Progress Notes (Signed)
  [facility]  Discharge Summary  DEVAUGHN SAVANT [id number]  Admission date: 01/23/2024 Discharge date: 02/10/2024  Reason for admission: Per admission assessment: [name] is a 63 year old male who presented after a recent inpatient hospitalization.  He reports he was experiencing worsening depression with suicidal ideations to overdose on fentanyl .  He reports multiple attempts to harm himself in the past.  States this is a 6 inpatient hospitalization for the year.  Currently prescribed Abilify , Zoloft , prazosin  and Wellbutrin  which he reports he has been taking and tolerating well.  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  Denied that never been married or have any children. He reported limited family support.  States he has 2 sisters and a mother however, has a strained relationship with multiple family members.   Progress in Program Toward Treatment Goals: Progressing Orel attended and participated with daily group session with active and engaged participation.  Denying any suicidal or homicidal ideations at discharge.  Reports overall his mood has improved since attending the program.  He reports he has secured housing as he was invited to stay with friends until his housing voucher is approved by the city.   Progress (rationale): Currently followed by psychiatrist resident Kapoor, will need to follow-up with individual therapy next outpatient follow-up appointment.  Declined medication refills at this visit.  Collaboration of Care: Psychiatrist AEB Kapoor  Patient/Guardian was advised Release of Information must be obtained prior to any record release in order to collaborate their care with an outside provider. Patient/Guardian was advised if they have not already done so to contact the registration department to sign all necessary forms in order for us  to release information  regarding their care.   Consent: Patient/Guardian gives verbal consent for treatment and assignment of benefits for services provided during this visit. Patient/Guardian expressed understanding and agreed to proceed.    Staci Kerns NP 02/10/2024

## 2024-02-13 ENCOUNTER — Encounter (HOSPITAL_COMMUNITY): Payer: Self-pay

## 2024-02-13 ENCOUNTER — Ambulatory Visit (HOSPITAL_COMMUNITY)

## 2024-02-13 ENCOUNTER — Other Ambulatory Visit: Payer: Self-pay

## 2024-02-13 VITALS — BP 133/76 | HR 75 | Ht 68.0 in | Wt 242.0 lb

## 2024-02-13 DIAGNOSIS — F4312 Post-traumatic stress disorder, chronic: Secondary | ICD-10-CM | POA: Diagnosis not present

## 2024-02-13 DIAGNOSIS — Z758 Other problems related to medical facilities and other health care: Secondary | ICD-10-CM

## 2024-02-13 DIAGNOSIS — F411 Generalized anxiety disorder: Secondary | ICD-10-CM | POA: Diagnosis not present

## 2024-02-13 DIAGNOSIS — F332 Major depressive disorder, recurrent severe without psychotic features: Secondary | ICD-10-CM

## 2024-02-13 MED ORDER — HYDROXYZINE HCL 25 MG PO TABS
25.0000 mg | ORAL_TABLET | Freq: Three times a day (TID) | ORAL | 2 refills | Status: DC | PRN
  Filled 2024-02-13 – 2024-02-15 (×2): qty 30, 10d supply, fill #0
  Filled 2024-03-13: qty 30, 10d supply, fill #1
  Filled 2024-04-13: qty 30, 10d supply, fill #2

## 2024-02-13 MED ORDER — PRAZOSIN HCL 1 MG PO CAPS
1.0000 mg | ORAL_CAPSULE | Freq: Every day | ORAL | 2 refills | Status: DC
  Filled 2024-02-13 – 2024-03-13 (×3): qty 30, 30d supply, fill #0
  Filled 2024-04-13: qty 30, 30d supply, fill #1

## 2024-02-13 MED ORDER — BUPROPION HCL ER (XL) 150 MG PO TB24
150.0000 mg | ORAL_TABLET | Freq: Every day | ORAL | 2 refills | Status: DC
  Filled 2024-02-13 – 2024-03-13 (×3): qty 30, 30d supply, fill #0
  Filled 2024-04-13: qty 30, 30d supply, fill #1

## 2024-02-13 MED ORDER — ARIPIPRAZOLE 15 MG PO TABS
15.0000 mg | ORAL_TABLET | Freq: Every day | ORAL | 2 refills | Status: DC
  Filled 2024-02-13 – 2024-02-15 (×2): qty 30, 30d supply, fill #0
  Filled 2024-03-13: qty 30, 30d supply, fill #1
  Filled 2024-04-04 – 2024-04-13 (×2): qty 30, 30d supply, fill #2

## 2024-02-13 MED ORDER — SERTRALINE HCL 100 MG PO TABS
200.0000 mg | ORAL_TABLET | Freq: Every day | ORAL | 2 refills | Status: DC
  Filled 2024-02-13: qty 60, 30d supply, fill #0
  Filled 2024-03-13: qty 60, 30d supply, fill #1
  Filled 2024-04-13: qty 60, 30d supply, fill #2

## 2024-02-13 MED ORDER — TRAZODONE HCL 50 MG PO TABS
50.0000 mg | ORAL_TABLET | Freq: Every evening | ORAL | 2 refills | Status: DC | PRN
  Filled 2024-02-13: qty 15, 15d supply, fill #0
  Filled 2024-03-13: qty 15, 15d supply, fill #1
  Filled 2024-04-13: qty 15, 15d supply, fill #2

## 2024-02-13 NOTE — Addendum Note (Signed)
 Addended by: CARVIN CROCK on: 02/13/2024 02:32 PM   Modules accepted: Level of Service

## 2024-02-13 NOTE — Therapy (Signed)
 [facility] [address] [place], [place], [address] Phone: [phone]   Fax:  [phone]  Occupational Therapy Treatment  Patient Details  Name: [name] MRN: [id number] Date of Birth: July 04, 1960 No data recorded  Encounter Date: 02/03/2024   OT End of Session - 02/13/24 2213     Visit Number 8    Number of Visits 15    Date for Recertification  02/10/24    OT Start Time 1230    OT Stop Time 1330    OT Time Calculation (min) 60 min          Past Medical History:  Diagnosis Date   Cancer (HCC)    melanoma;  right ear   Depression    Diabetes mellitus    Diabetes mellitus, type II (HCC)    GERD (gastroesophageal reflux disease)    Hyperlipidemia    Hypertension    Metabolic syndrome    Obesity    PONV (postoperative nausea and vomiting)    YRS AGO WITHER ETHER    Past Surgical History:  Procedure Laterality Date   COLONOSCOPY  05/01/2013   CYST REMOVAL TRUNK  1989   benign   MELANOMA EXCISION  2008   right ear and neck   ORIF HUMERUS FRACTURE Left 12/23/2020   Procedure: OPEN REDUCTION INTERNAL FIXATION (ORIF) PROXIMAL HUMERUS FRACTURE;  Surgeon: [name], MD;  Location: [facility];  Service: Orthopedics;  Laterality: Left;    POLYPECTOMY     reconstructive plastic surgery face  1967   dog bite at age 59; about 15 surgeries for 10 years   TONSILLECTOMY  1966   age 37    There were no vitals filed for this visit.   Subjective Assessment - 02/13/24 2212     Currently in Pain? No/denies    Pain Score 0-No pain              Group Session:  S: Doing okay today. Still having issues with sleep because of the car situation. (Sleeping in car)  O: The primary objective of this topic is to explore and understand the concept of occupational balance in the context of daily living. The term occupational balance is defined broadly, encompassing all activities that occupy an individual's time and energy,  including self-care, leisure, and work-related tasks. The goal is to guide participants towards achieving a harmonious blend of these activities, tailored to their personal values and life circumstances. This balance is aimed at enhancing overall well-being, not by equally distributing time across activities, but by ensuring that daily engagements are fulfilling and not draining. The content delves into identifying various barriers that individuals face in achieving occupational balance, such as overcommitment, misaligned priorities, external pressures, and lack of effective time management. The impact of these barriers on occupational performance, roles, and lifestyles is examined, highlighting issues like reduced efficiency, strained relationships, and potential health problems. Strategies for cultivating occupational balance are a key focus. These strategies include practical methods like time blocking, prioritizing tasks, establishing self-care rituals, decluttering, connecting with nature, and engaging in reflective practices. These approaches are designed to be adaptable and applicable to a wide range of life scenarios, promoting a proactive and mindful approach to daily living. The overall aim is to equip participants with the knowledge and tools to create a balanced lifestyle that supports their mental, emotional, and physical health, thereby improving their functional performance in daily life.   A:  The patient demonstrated a high level of  engagement and active participation throughout the session on occupational balance. The patient frequently contributed to discussions, offering insightful reflections on personal experiences related to the barriers and strategies for achieving occupational balance. There was a clear understanding of the concept and an ability to relate it to their own life. The patient showed enthusiasm in learning and applying the strategies discussed, such as time blocking and  self-care rituals, indicating a strong motivation to improve their occupational balance. The patient's proactive approach and responsiveness to the topic suggest a high potential for implementing these strategies effectively in their daily routine.      P: Continue to attend PHP OT group sessions 5x week for 4 weeks to promote daily structure, social engagement, and opportunities to develop and utilize adaptive strategies to maximize functional performance in preparation for safe transition and integration back into school, work, and the community. Plan to address topic of tbd in next OT group session.                  OT Education - 02/13/24 2212     Education Details Occupational Balance           OT Short Term Goals - 01/25/24 2132       OT SHORT TERM GOAL #1   Title By the time of discharge, client will independently set, track, and make progress towards a long-term goal, demonstrating resilience in overcoming obstacles and seeking support when needed.    Time 3    Period Weeks    Status On-going    Target Date 02/10/24      OT SHORT TERM GOAL #2   Title Client will independently identify and modify three areas of the current routine that contribute to increased stress or dysfunction by the end of therapy.    Status On-going      OT SHORT TERM GOAL #3   Title Client will independently identify and list three personal triggers that lead to heightened stress or negative emotional responses by the end of 3 sessions.    Status On-going      OT SHORT TERM GOAL #4   Title Client will demonstrate the use of at least two different relaxation techniques (e.g., deep breathing, progressive muscle relaxation) without prompting when faced with a stressful situation in four out of five trials.    Status On-going                   Plan - 02/13/24 2213     Psychosocial Skills Interpersonal Interaction;Habits;Environmental  Adaptations;Coping Strategies;Routines  and Behaviors          Patient will benefit from skilled therapeutic intervention in order to improve the following deficits and impairments:       Psychosocial Skills: Interpersonal Interaction, Habits, Environmental  Adaptations, Coping Strategies, Routines and Behaviors   Visit Diagnosis: Difficulty coping    Problem List Patient Active Problem List   Diagnosis Date Noted   Major depressive disorder, recurrent, severe without psychotic features (HCC) 12/28/2023   Major depressive disorder, recurrent episode, severe, with psychotic behavior (HCC) 11/06/2023   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 05/11/2023   Severe alcohol  use disorder (HCC) 05/09/2023   Treatment-resistant depression 04/25/2023   Chronic posttraumatic stress disorder 09/27/2022   Cannabis abuse 09/27/2022   Dyslipidemia 09/02/2021   S/P ORIF (open reduction internal fixation) fracture 12/23/2020   Eczema 12/02/2015   Hypertension associated with diabetes (HCC) 03/04/2015   GERD (gastroesophageal reflux disease) 03/04/2015   Class 3 severe  obesity due to excess calories with serious comorbidity and body mass index (BMI) of 40.0 to 44.9 in adult [facility]) 12/13/2008   Type 2 diabetes mellitus without complication, with long-term current use of insulin  (HCC) 03/05/2008   History of melanoma. R ear.  12/16/2006   Major depression, recurrent, full remission 12/16/2006   Hyperlipidemia associated with type 2 diabetes mellitus (HCC) 11/07/2006    Dallas KANDICE Purpura, OT 02/13/2024, 10:13 PM  Dallas Purpura, OT   [facility] [address] [place], [place], [address] Phone: [phone]   Fax:  [phone]  Name: [name] MRN: [id number] Date of Birth: October 27, 1960

## 2024-02-13 NOTE — Therapy (Signed)
  [facility] [address] [place], [place], [address] Phone: [phone]   Fax:  [phone]  Occupational Therapy Treatment  Patient Details  Name: [name] MRN: [id number] Date of Birth: [birth year] No data recorded  Encounter Date: 01/31/2024   OT End of Session - 02/13/24 2129     Visit Number 7    Number of Visits 15    Date for Recertification  02/10/24    OT Start Time 1230    OT Stop Time 1330    OT Time Calculation (min) 60 min          Past Medical History:  Diagnosis Date   Cancer (HCC)    melanoma;  right ear   Depression    Diabetes mellitus    Diabetes mellitus, type II (HCC)    GERD (gastroesophageal reflux disease)    Hyperlipidemia    Hypertension    Metabolic syndrome    Obesity    PONV (postoperative nausea and vomiting)    YRS AGO WITHER ETHER    Past Surgical History:  Procedure Laterality Date   COLONOSCOPY  05/01/2013   CYST REMOVAL TRUNK  1989   benign   MELANOMA EXCISION  2008   right ear and neck   ORIF HUMERUS FRACTURE Left 12/23/2020   Procedure: OPEN REDUCTION INTERNAL FIXATION (ORIF) PROXIMAL HUMERUS FRACTURE;  Surgeon: [name], MD;  Location: [facility];  Service: Orthopedics;  Laterality: Left;    POLYPECTOMY     reconstructive plastic surgery face  1967   dog bite at age 63; about 15 surgeries for 10 years   TONSILLECTOMY  1966   age 63    There were no vitals filed for this visit.   Subjective Assessment - 02/13/24 2129     Currently in Pain? No/denies    Pain Score 0-No pain              Group Session:  S: Doing okay. Sleep is a real struggle in the car.   O: The primary objective of this topic is to explore and understand the concept of occupational balance in the context of daily living. The term occupational balance is defined broadly, encompassing all activities that occupy an individual's time and energy, including self-care, leisure, and work-related  tasks. The goal is to guide participants towards achieving a harmonious blend of these activities, tailored to their personal values and life circumstances. This balance is aimed at enhancing overall well-being, not by equally distributing time across activities, but by ensuring that daily engagements are fulfilling and not draining. The content delves into identifying various barriers that individuals face in achieving occupational balance, such as overcommitment, misaligned priorities, external pressures, and lack of effective time management. The impact of these barriers on occupational performance, roles, and lifestyles is examined, highlighting issues like reduced efficiency, strained relationships, and potential health problems. Strategies for cultivating occupational balance are a key focus. These strategies include practical methods like time blocking, prioritizing tasks, establishing self-care rituals, decluttering, connecting with nature, and engaging in reflective practices. These approaches are designed to be adaptable and applicable to a wide range of life scenarios, promoting a proactive and mindful approach to daily living. The overall aim is to equip participants with the knowledge and tools to create a balanced lifestyle that supports their mental, emotional, and physical health, thereby improving their functional performance in daily life.   A:  The patient demonstrated a high level of engagement and active participation throughout  the session on occupational balance. The patient frequently contributed to discussions, offering insightful reflections on personal experiences related to the barriers and strategies for achieving occupational balance. There was a clear understanding of the concept and an ability to relate it to their own life. The patient showed enthusiasm in learning and applying the strategies discussed, such as time blocking and self-care rituals, indicating a strong  motivation to improve their occupational balance. The patient's proactive approach and responsiveness to the topic suggest a high potential for implementing these strategies effectively in their daily routine.    P: Continue to attend PHP OT group sessions 5x week for 4 weeks to promote daily structure, social engagement, and opportunities to develop and utilize adaptive strategies to maximize functional performance in preparation for safe transition and integration back into school, work, and the community. Plan to address topic of tbd in next OT group session.                  OT Education - 02/13/24 2129     Education Details Occupational Balance           OT Short Term Goals - 01/25/24 2132       OT SHORT TERM GOAL #1   Title By the time of discharge, client will independently set, track, and make progress towards a long-term goal, demonstrating resilience in overcoming obstacles and seeking support when needed.    Time 3    Period Weeks    Status On-going    Target Date 02/10/24      OT SHORT TERM GOAL #2   Title Client will independently identify and modify three areas of the current routine that contribute to increased stress or dysfunction by the end of therapy.    Status On-going      OT SHORT TERM GOAL #3   Title Client will independently identify and list three personal triggers that lead to heightened stress or negative emotional responses by the end of 3 sessions.    Status On-going      OT SHORT TERM GOAL #4   Title Client will demonstrate the use of at least two different relaxation techniques (e.g., deep breathing, progressive muscle relaxation) without prompting when faced with a stressful situation in four out of five trials.    Status On-going                   Plan - 02/13/24 2130     Psychosocial Skills Interpersonal Interaction;Habits;Environmental  Adaptations;Coping Strategies;Routines and Behaviors          Patient will  benefit from skilled therapeutic intervention in order to improve the following deficits and impairments:       Psychosocial Skills: Interpersonal Interaction, Habits, Environmental  Adaptations, Coping Strategies, Routines and Behaviors   Visit Diagnosis: Difficulty coping    Problem List Patient Active Problem List   Diagnosis Date Noted   Major depressive disorder, recurrent, severe without psychotic features (HCC) 12/28/2023   Major depressive disorder, recurrent episode, severe, with psychotic behavior (HCC) 11/06/2023   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 05/11/2023   Severe alcohol  use disorder (HCC) 05/09/2023   Treatment-resistant depression 04/25/2023   Chronic posttraumatic stress disorder 09/27/2022   Cannabis abuse 09/27/2022   Dyslipidemia 09/02/2021   S/P ORIF (open reduction internal fixation) fracture 12/23/2020   Eczema 12/02/2015   Hypertension associated with diabetes (HCC) 03/04/2015   GERD (gastroesophageal reflux disease) 03/04/2015   Class 3 severe obesity due to excess calories with serious  comorbidity and body mass index (BMI) of 40.0 to 44.9 in adult [facility]) 12/13/2008   Type 2 diabetes mellitus without complication, with long-term current use of insulin  (HCC) 03/05/2008   History of melanoma. R ear.  12/16/2006   Major depression, recurrent, full remission 12/16/2006   Hyperlipidemia associated with type 2 diabetes mellitus (HCC) 11/07/2006    D[place] KANDICE Purpura, OT 02/13/2024, 9:30 PM  Dallas Purpura, OT   [facility] [address] [place], [place], [address] Phone: [phone]   Fax:  [phone]  Name: CLEMENTS TORO MRN: [id number] Date of Birth: [birth year]
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
## 2024-02-15 ENCOUNTER — Other Ambulatory Visit: Payer: Self-pay

## 2024-02-15 ENCOUNTER — Ambulatory Visit (HOSPITAL_COMMUNITY): Admitting: Licensed Clinical Social Worker

## 2024-02-17 ENCOUNTER — Other Ambulatory Visit: Payer: Self-pay

## 2024-02-20 ENCOUNTER — Encounter (HOSPITAL_COMMUNITY): Payer: Self-pay

## 2024-02-20 NOTE — Therapy (Signed)
 [facility] [address] [place], [place], [address] Phone: [phone]   Fax:  [phone]  Occupational Therapy Treatment  Patient Details  Name: [name] MRN: [id number] Date of Birth: [birth year] No data recorded  Encounter Date: 02/07/2024   OT End of Session - 02/20/24 2157     Visit Number 10    Number of Visits 15    Date for Recertification  02/10/24    OT Start Time 1230    OT Stop Time 1330    OT Time Calculation (min) 60 min    Activity Tolerance Patient tolerated treatment well          Past Medical History:  Diagnosis Date   Cancer (HCC)    melanoma;  right ear   Depression    Diabetes mellitus    Diabetes mellitus, type II (HCC)    GERD (gastroesophageal reflux disease)    Hyperlipidemia    Hypertension    Metabolic syndrome    Obesity    PONV (postoperative nausea and vomiting)    YRS AGO WITHER ETHER    Past Surgical History:  Procedure Laterality Date   COLONOSCOPY  05/01/2013   CYST REMOVAL TRUNK  1989   benign   MELANOMA EXCISION  2008   right ear and neck   ORIF HUMERUS FRACTURE Left 12/23/2020   Procedure: OPEN REDUCTION INTERNAL FIXATION (ORIF) PROXIMAL HUMERUS FRACTURE;  Surgeon: [name], MD;  Location: [facility];  Service: Orthopedics;  Laterality: Left;    POLYPECTOMY     reconstructive plastic surgery face  1967   dog bite at age 63; about 15 surgeries for 10 years   TONSILLECTOMY  1966   age 63    There were no vitals filed for this visit.   Subjective Assessment - 02/20/24 2156     Currently in Pain? No/denies    Pain Score 0-No pain              Group Session:  S: Doing better today. Sleeping better lately.  O: During today's OT group session, the patient participated in an educational segment about the importance of goal-setting and the application of the SMART framework to enhance daily life, particularly focusing on ADLs and iADLs. The session began with five  open-ended pre-session questions that facilitated group discussion and introspection about their current relationship with goals. Following the introduction and educational segment, participants engaged in brainstorming and group discussions to devise hypothetical SMART goals. The session concluded with five post-session questions to reinforce understanding and facilitate reflection. Throughout the session, there was a range of engagement levels noted among the participants.   A:  Patient demonstrated a high level of engagement throughout the session. They actively participated in discussions, sharing personal experiences related to goal setting and challenges faced. Patient was able to clearly articulate an understanding of the SMART framework and proposed personal SMART goals related to their own ADLs with minimal assistance. They expressed enthusiasm about applying what they learned to their daily routine and appeared motivated to make changes.    P: Continue to attend PHP OT group sessions 5x week for 4 weeks to promote daily structure, social engagement, and opportunities to develop and utilize adaptive strategies to maximize functional performance in preparation for safe transition and integration back into school, work, and the community. Plan to address topic of tbd in next OT group session.  OT Education - 02/20/24 2156     Education Details SMART Goals           OT Short Term Goals - 01/25/24 2132       OT SHORT TERM GOAL #1   Title By the time of discharge, client will independently set, track, and make progress towards a long-term goal, demonstrating resilience in overcoming obstacles and seeking support when needed.    Time 3    Period Weeks    Status On-going    Target Date 02/10/24      OT SHORT TERM GOAL #2   Title Client will independently identify and modify three areas of the current routine that contribute to increased stress or  dysfunction by the end of therapy.    Status On-going      OT SHORT TERM GOAL #3   Title Client will independently identify and list three personal triggers that lead to heightened stress or negative emotional responses by the end of 3 sessions.    Status On-going      OT SHORT TERM GOAL #4   Title Client will demonstrate the use of at least two different relaxation techniques (e.g., deep breathing, progressive muscle relaxation) without prompting when faced with a stressful situation in four out of five trials.    Status On-going                   Plan - 02/20/24 2157     Psychosocial Skills Interpersonal Interaction;Habits;Environmental  Adaptations;Coping Strategies;Routines and Behaviors          Patient will benefit from skilled therapeutic intervention in order to improve the following deficits and impairments:       Psychosocial Skills: Interpersonal Interaction, Habits, Environmental  Adaptations, Coping Strategies, Routines and Behaviors   Visit Diagnosis: Difficulty coping    Problem List Patient Active Problem List   Diagnosis Date Noted   Major depressive disorder, recurrent, severe without psychotic features (HCC) 12/28/2023   Major depressive disorder, recurrent episode, severe, with psychotic behavior (HCC) 11/06/2023   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 05/11/2023   Severe alcohol  use disorder (HCC) 05/09/2023   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 R ear.  12/16/2006   Major depression, recurrent, full remission 12/16/2006   Hyperlipidemia associated with type 2 diabetes mellitus (HCC) 11/07/2006    [place] KANDICE Purpura, OT 02/20/2024, 9:58 PM  Dallas Purpura, OT   [facility] [address] [place], [place], [address] Phone: [phone]   Fax:  [phone]  Name: OMERO KOWAL MRN: [id number] Date of Birth: 01/10/1961

## 2024-02-20 NOTE — Therapy (Signed)
 [facility] [address] [place], [place], [address] Phone: [phone]   Fax:  [phone]  Occupational Therapy Treatment  Patient Details  Name: [name] MRN: [id number] Date of Birth: 1960-09-16 No data recorded  Encounter Date: 02/06/2024   OT End of Session - 02/20/24 2128     Visit Number 9    Number of Visits 15    Date for Recertification  02/10/24    OT Start Time 1230    OT Stop Time 1330    OT Time Calculation (min) 60 min          Past Medical History:  Diagnosis Date   Cancer (HCC)    melanoma;  right ear   Depression    Diabetes mellitus    Diabetes mellitus, type II (HCC)    GERD (gastroesophageal reflux disease)    Hyperlipidemia    Hypertension    Metabolic syndrome    Obesity    PONV (postoperative nausea and vomiting)    YRS AGO WITHER ETHER    Past Surgical History:  Procedure Laterality Date   COLONOSCOPY  05/01/2013   CYST REMOVAL TRUNK  1989   benign   MELANOMA EXCISION  2008   right ear and neck   ORIF HUMERUS FRACTURE Left 12/23/2020   Procedure: OPEN REDUCTION INTERNAL FIXATION (ORIF) PROXIMAL HUMERUS FRACTURE;  Surgeon: [name], MD;  Location: [facility];  Service: Orthopedics;  Laterality: Left;    POLYPECTOMY     reconstructive plastic surgery face  1967   dog bite at age 15; about 15 surgeries for 10 years   TONSILLECTOMY  1966   age 63    There were no vitals filed for this visit.   Subjective Assessment - 02/20/24 2128     Currently in Pain? No/denies    Pain Score 0-No pain              Group Session:  S: Doing better today  O: During today's OT group session, the patient participated in an educational segment about the importance of goal-setting and the application of the SMART framework to enhance daily life, particularly focusing on ADLs and iADLs. The session began with five open-ended pre-session questions that facilitated group discussion and  introspection about their current relationship with goals. Following the introduction and educational segment, participants engaged in brainstorming and group discussions to devise hypothetical SMART goals. The session concluded with five post-session questions to reinforce understanding and facilitate reflection. Throughout the session, there was a range of engagement levels noted among the participants.   A:  Patient demonstrated a high level of engagement throughout the session. They actively participated in discussions, sharing personal experiences related to goal setting and challenges faced. Patient was able to clearly articulate an understanding of the SMART framework and proposed personal SMART goals related to their own ADLs with minimal assistance. They expressed enthusiasm about applying what they learned to their daily routine and appeared motivated to make changes.    P: Continue to attend PHP OT group sessions 5x week for 4 weeks to promote daily structure, social engagement, and opportunities to develop and utilize adaptive strategies to maximize functional performance in preparation for safe transition and integration back into school, work, and the community. Plan to address topic of tbd in next OT group session.                  OT Education - 02/20/24 2128     Education  Details SMART Goals    Person(s) Educated Patient    Methods Explanation;Handout    Comprehension Verbalized understanding           OT Short Term Goals - 01/25/24 2132       OT SHORT TERM GOAL #1   Title By the time of discharge, client will independently set, track, and make progress towards a long-term goal, demonstrating resilience in overcoming obstacles and seeking support when needed.    Time 3    Period Weeks    Status On-going    Target Date 02/10/24      OT SHORT TERM GOAL #2   Title Client will independently identify and modify three areas of the current routine that  contribute to increased stress or dysfunction by the end of therapy.    Status On-going      OT SHORT TERM GOAL #3   Title Client will independently identify and list three personal triggers that lead to heightened stress or negative emotional responses by the end of 3 sessions.    Status On-going      OT SHORT TERM GOAL #4   Title Client will demonstrate the use of at least two different relaxation techniques (e.g., deep breathing, progressive muscle relaxation) without prompting when faced with a stressful situation in four out of five trials.    Status On-going                   Plan - 02/20/24 2128     Psychosocial Skills Interpersonal Interaction;Habits;Environmental  Adaptations;Coping Strategies;Routines and Behaviors          Patient will benefit from skilled therapeutic intervention in order to improve the following deficits and impairments:       Psychosocial Skills: Interpersonal Interaction, Habits, Environmental  Adaptations, Coping Strategies, Routines and Behaviors   Visit Diagnosis: Difficulty coping    Problem List Patient Active Problem List   Diagnosis Date Noted   Major depressive disorder, recurrent, severe without psychotic features (HCC) 12/28/2023   Major depressive disorder, recurrent episode, severe, with psychotic behavior (HCC) 11/06/2023   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 05/11/2023   Severe alcohol  use disorder (HCC) 05/09/2023   Treatment-resistant depression 04/25/2023   Chronic posttraumatic stress disorder 09/27/2022   Cannabis abuse 09/27/2022   Dyslipidemia 09/02/2021   S/P ORIF (open reduction internal fixation) fracture 12/23/2020   Eczema 12/02/2015   Hypertension associated with diabetes (HCC) 03/04/2015   GERD (gastroesophageal reflux disease) 03/04/2015   Class 3 severe obesity due to excess calories with serious comorbidity and body mass index (BMI) of 40.0 to 44.9 in adult (HCC) 12/13/2008   Type 2 diabetes mellitus  without complication, with long-term current use of insulin  (HCC) 03/05/2008   History of melanoma. R ear.  12/16/2006   Major depression, recurrent, full remission 12/16/2006   Hyperlipidemia associated with type 2 diabetes mellitus (HCC) 11/07/2006    Dallas KANDICE Purpura, OT 02/20/2024, 9:29 PM  Dallas Purpura, OT   [facility] [address] [place], [place], [address] Phone: [phone]   Fax:  [phone]  Name: JOHNMATTHEW SOLORIO MRN: [id number] Date of Birth: [birth year]

## 2024-02-21 ENCOUNTER — Encounter (HOSPITAL_COMMUNITY): Payer: Self-pay

## 2024-02-21 ENCOUNTER — Ambulatory Visit (INDEPENDENT_AMBULATORY_CARE_PROVIDER_SITE_OTHER): Admitting: Licensed Clinical Social Worker

## 2024-02-21 DIAGNOSIS — F332 Major depressive disorder, recurrent severe without psychotic features: Secondary | ICD-10-CM | POA: Diagnosis not present

## 2024-02-21 DIAGNOSIS — F411 Generalized anxiety disorder: Secondary | ICD-10-CM

## 2024-02-21 DIAGNOSIS — F4312 Post-traumatic stress disorder, chronic: Secondary | ICD-10-CM

## 2024-02-21 NOTE — Progress Notes (Signed)
 THERAPIST PROGRESS NOTE   Session Date: 02/21/2024  Session Time: 1406 - 1501  Participation Level: Active  MSE/Presentation: Behavior: Appropriate and Sharing Speech: Normal Thought Process: Coherent and Relevant Cognition: Alert, Appropriate, and Oriented Mood: Anxious and Euthymic Affect: Appropriate and Congruent Insight: Improving Appearance: Casual  Type of Therapy: Individual Therapy  Treatment Goals addressed:   Initial (8) LTG: Increase coping skills to manage depression and improve ability to perform daily activities (OP Depression) STG: Reduce overall depression score by a minimum of 25% on the Patient Health Questionnaire (PHQ-9) (OP Depression) STG: [name] will identify cognitive patterns and beliefs that support depression (OP Depression) LTG: Gain better control of, and resiliency in response to, periods of depressed moods and symptoms, AEB improved understanding and management of them, and moving forward throughout my day (OP Depression) LTG: Recall traumatic events without becoming overwhelmed with negative emotions (BH CCP Acute or Chronic Trauma Reaction) LTG: Be able to better understand what happened to me in the past, and come up with a framework of dealing with that, so that it doesn't debilitate me and/or result in experiencing a lot of negative thoughts about myself (BH CCP Acute or Chronic Trauma Reaction) STG: Report a decrease in anxiety symptoms as evidenced by an overall reduction in anxiety score by a minimum of 25% on the Generalized Anxiety Disorder Scale (GAD-7) (Anxiety) LTG: Improve abilities to better compartmentalize what it is that I'm worrying about and put things aside, as typically they're related to things that are outside of my control (Anxiety)  Progress Towards Goals: Initial  Interventions: CBT, Motivational Interviewing, Solution Focused, Strength-based, and Supportive  Summary: [name] a(n) 63 y.o. male,  with psych hx consistent with MDD, severe, recurrent, CPTSD, and GAD, presenting for follow-up therapy session in efforts to improve management of presenting symptoms.   Patient actively engaged in session, presenting with anxious yet pleasant mood and congruent affect.  Patient participated in check-in and reported doing well overall over the past six weeks since the initial visit. Patient engaged in brief discussion regarding benefits gained and progress made during recent participation in a Partial Hospitalization Program (PHP) over the past month. Session included discussion of the need to identify and develop appropriate treatment goals to finalize the treatment plan and guide the future course of outpatient therapy (OPT).  Patient engaged in reassessment of depressive and anxious symptoms using the PHQ-9 and GAD-7. Reviewed and processed score variances compared to screenings completed two weeks prior at [facility] discharge. Patient noted a minor increase in GAD-7 ratings and a minor decrease in PHQ-9 scores. Explored contributing factors influencing these changes. Patient also completed the [place]-Suicide Severity Rating Scale (C-SSRS), and risk and protective factors were reviewed and processed collaboratively.  Patient engaged in exploration of primary areas of concern, including depression, PTSD, and anxiety. Discussed personal goals for therapeutic progression and identified areas for focused work. Collaboratively processed and began identifying standardized, measurable, and individualized treatment goals to guide ongoing treatment. Patient remained open, reflective, and engaged throughout the visit. Demonstrated insight into symptom changes and areas for personal growth. Expressed motivation to establish meaningful and measurable treatment goals. No acute safety concerns identified.     02/21/2024    2:13 PM 02/06/2024    2:44 PM 01/30/2024    2:23 PM 01/23/2024    2:06 PM  GAD 7 : Generalized  Anxiety Score  Nervous, Anxious, on Edge 0 0 0 1  Control/stop worrying 1 0 2 1  Worry too  much - different things 1 1 1 1   Trouble relaxing 0 0 3 2  Restless 0 0 0 0  Easily annoyed or irritable 1 1 1 1   Afraid - awful might happen 2 1 2 2   Total GAD 7 Score 5 3 9 8   Anxiety Difficulty Somewhat difficult Somewhat difficult Somewhat difficult Somewhat difficult      02/21/2024    2:14 PM 02/13/2024    1:18 PM 02/07/2024   11:25 AM 02/06/2024    2:43 PM 01/30/2024    2:22 PM  Depression screen PHQ 2/9  Decreased Interest 0 0 1 1 0  Down, Depressed, Hopeless 0 0 1 1 1   PHQ - 2 Score 0 0 2 2 1   Altered sleeping 0  1 1 3   Tired, decreased energy 0  0 2 1  Change in appetite 1  0 1 0  Feeling bad or failure about yourself  3  3 3 2   Trouble concentrating 0  0 0 0  Moving slowly or fidgety/restless 0  0 0 0  Suicidal thoughts 1  1 1 1   PHQ-9 Score 5  7 10 8    Difficult doing work/chores Somewhat difficult   Somewhat difficult Somewhat difficult     Data saved with a previous flowsheet row definition   Flowsheet Row Counselor from 02/21/2024 in [facility] [place] Counselor from 01/24/2024 in [facility] Counselor from 01/12/2024 in [facility] RISK CATEGORY Moderate Risk High Risk High Risk    Suicidal/Homicidal: None, No plan to harm self or others  Therapist Response:   Clinician actively greeted pt upon presenting for visit, assessing presenting moods and affect, engaging in check-in, acknowledging presenting moods.  Utilized open ended questions in eliciting recounts of events of the past six weeks, exploring identified stressors and individual efforts at navigating challenges. Actively listened to pt's recounts of events, providing support and validation of identified challenges, and thoughts and feelings experienced surrounding stressors. Engage patient in  review of symptoms experienced over the past two weeks via PHQ-9 and GAD-7, engaging further in reflection of current screening scores and observations of increased and/or decreased symptoms. Engaged in review of individualized treatment goals, eliciting patient's thoughts and feelings surrounding areas of continued work in identifying applicable tx goals. CBT, Motivational Interviewing, Solution-Focused techniques, psychoeducation, and Behavioral Activation strategies were utilized to support symptom assessment, clarify treatment direction, and facilitate goal development. Clinician reassessed severity of presenting sxs, and presence of any safety concerns.  []  Cognitive Challenging [x]  Cognitive Refocusing []  Cognitive Reframing  []  Communication Skills []  Compliance Issues []  DBT [x]  Exploration of Coping Patterns [x]  Exploration of Emotions [x]  Exploration of Relationship Patterns []  Guided Imagery []  Interactive Feedback []  Interpersonal Resolutions []  Mindfulness Training []  Preventative Services [x]  Psycho-Education  []  Relaxation/Deep Breathing [x]  Review of Treatment Plan/Progress []  Role-Play/Behavioral Rehearsal  [x]  Structured Problem Solving [x]  Supportive Reflection [x]  Symptom Management  []  Other   Patient responded well to interventions. Patient continues to meet criteria for MDD, severe, recurrent, CPTSD, and GAD. Patient will continue to benefit from engagement in outpatient therapy due to being the least restrictive service to meet presenting needs.    Homework: None.  Plan: Return again in 1 weeks.   Diagnosis:  Encounter Diagnoses  Name Primary?   Major depressive disorder, recurrent, severe without psychotic features (HCC) Yes   Chronic posttraumatic stress disorder    GAD (generalized anxiety  disorder)     Collaboration of Care: Psychiatrist AEB provider documentation available in EHR.  Patient/Guardian was advised Release of Information must be obtained prior  to any record release in order to collaborate their care with an outside provider. Patient/Guardian was advised if they have not already done so to contact the registration department to sign all necessary forms in order for us  to release information regarding their care.   Consent: Patient/Guardian gives verbal consent for treatment and assignment of benefits for services provided during this visit. Patient/Guardian expressed understanding and agreed to proceed.   Virtual Visit via Video Note  I connected with [name] on 02/21/24 at  2:00 PM EST by a video enabled telemedicine application and verified that I am speaking with the correct person using two identifiers.  Location: Patient: Friend's residence Provider: Home office   I discussed the limitations of evaluation and management by telemedicine and the availability of in person appointments. The patient expressed understanding and agreed to proceed.  I discussed the assessment and treatment plan with the patient. The patient was provided an opportunity to ask questions and all were answered. The patient agreed with the plan and demonstrated an understanding of the instructions.   The patient was advised to call back or seek an in-person evaluation if the symptoms worsen or if the condition fails to improve as anticipated.  I provided 55 minutes of non-face-to-face time during this encounter.   [place] JONETTA Maris, MSW, LCSW 02/21/2024,  2:55 PM

## 2024-02-24 NOTE — Psych (Signed)
 [facility] BH PHP THERAPIST PROGRESS NOTE  JAMAHL LEMMONS [id number]   Session Time: 9:00 am - 10:00 am  Participation Level: Active  Behavioral Response: CasualAlertAnxious and Depressed  Type of Therapy: Group Therapy  Treatment Goals addressed: Coping  Progress Towards Goals: Progressing  Interventions: CBT, DBT, Solution Focused, Strength-based, Supportive, and Reframing  Therapist Response: Clinician led check-in regarding current stressors and situation, and review of patient completed daily inventory. Clinician utilized active listening and empathetic response and validated patient emotions. Clinician facilitated processing group on pertinent issues.?   Summary: Patient arrived within time allowed. Patient rates their depression at a 1 and anxiety at a 1 on a scale of 1-10 with 10 being high. Pt reports they slept 8 hours last night and ate 3x yesterday. Pt states he had dinner with a friend last night and spent the night, which means a night not in his car. Pt reports feeling better this morning due to sleeping well. Pt reports increased hope due to an upcoming meeting with housing authority re: help with getting into a new place. Pt reports continued difficulty with negative self talk. Pt able to process.?Pt engaged in discussion.?      Session Time: 10:00 am - 11:00 am  Participation Level: Active  Behavioral Response: CasualAlertAnxious and Depressed  Type of Therapy: Group Therapy  Treatment Goals addressed: Coping  Progress Towards Goals: Progressing  Interventions: CBT, DBT, Solution Focused, Strength-based, Supportive, and Reframing  Therapist Response: Cln introduced I statements and the ways in which they can improve assertiveness and overall communication. Cln educated group on I statement formula and led practice.    Summary:  Pt engaged in discussion and demonstrates understanding of how to utilize I statements.         Session Time: 11:00 am -  12:00 pm   Participation Level: Active   Behavioral Response: CasualAlertAnxious   Type of Therapy: Group Therapy   Treatment Goals addressed: Coping   Progress Towards Goals: Progressing   Interventions: CBT, DBT, Solution Focused, Strength-based, Supportive, and Reframing   Therapist Response: Cln continued discussion on communication and utilized hand out [employer] to discuss ways to communicate in difficult or delicate situations. Group discussed the strategies and which were most problematic for them. Cln shaped discussion and provided clarity and guidance in how to apply.    Summary:  Pt engaged in discussion.              Session Time: 12:00 pm - 12:30 pm   Participation Level: Active   Behavioral Response: CasualAlertAnxious   Type of Therapy: Group Therapy   Treatment Goals addressed: Coping   Progress Towards Goals: Progressing   Interventions: Psychologist, Occupational, Supportive   Therapist Response: Reflection Group: Patients encouraged to practice skills and interpersonal techniques or work on mindfulness and relaxation techniques. The importance of self-care and making skills part of a routine to increase usage were stressed.   Summary: Patient engaged and participated appropriately.           Session Time: 12:30 pm - 1:30 pm   Participation Level: Active   Behavioral Response: CasualAlertAnxious   Type of Therapy: Group Therapy   Treatment Goals addressed: Coping   Progress Towards Goals: Progressing   Interventions: CBT, DBT, Solution Focused, Strength-based, Supportive, and Reframing   Therapist Response: Cln continued topic of CBT cognitive distortions and introduced thought challenging as a way to  utilize the challenge C in C-C-C. Group utilized Designer, Television/film Set questions as a  way to introduce challenges and reframe distorted thinking. Group members worked through pt examples to practice challenging distorted thinking.     Summary: Pt engaged and participated in discussion.           Session Time: 1:30 pm - 1:45 pm   Participation Level: Active   Behavioral Response: CasualAlertAnxious   Type of Therapy: Group Therapy   Treatment Goals addressed: Coping   Progress Towards Goals: Progressing   Interventions: CBT, DBT, Solution Focused, Strength-based, Supportive, and Reframing   Therapist Response: 1:30 pm - 1:45 pm: Clinician led check-out. Clinician assessed for immediate needs, medication compliance and efficacy, and safety concerns?   Summary: 1:30 pm - 1:45 pm: At check-out, patient reports no immediate concerns. Patient demonstrates progress as evidenced by engagement and responsiveness to treatment. Patient denies SI/HI/self-harm thoughts at the end of group.    Suicidal/Homicidal: Nowithout intent/plan  Plan: ?Pt will continue in PHP and medication management while continuing to work on decreasing depression symptoms,?SI, and anxiety symptoms,?and increasing the ability to self manage symptoms.     Collaboration of Care: Medication Management AEB Staci Kerns, NP  Patient/Guardian was advised Release of Information must be obtained prior to any record release in order to collaborate their care with an outside provider. Patient/Guardian was advised if they have not already done so to contact the registration department to sign all necessary forms in order for us  to release information regarding their care.   Consent: Patient/Guardian gives verbal consent for treatment and assignment of benefits for services provided during this visit. Patient/Guardian expressed understanding and agreed to proceed.   Diagnosis: Major depressive disorder, recurrent, severe without psychotic features (HCC) [F33.2]    1. Major depressive disorder, recurrent, severe without psychotic features (HCC)   2. Chronic posttraumatic stress disorder   3. GAD (generalized anxiety disorder)       Randall Bastos,  LCSW

## 2024-02-26 ENCOUNTER — Encounter (HOSPITAL_COMMUNITY): Payer: Self-pay

## 2024-02-26 NOTE — Psych (Signed)
 [facility] BH PHP THERAPIST PROGRESS NOTE  ALEXANDRE FARIES [id number]   Session Time: 9:00 am - 10:00 am  Participation Level: Active  Behavioral Response: CasualAlertAnxious and Depressed  Type of Therapy: Group Therapy  Treatment Goals addressed: Coping  Progress Towards Goals: Progressing  Interventions: CBT, DBT, Solution Focused, Strength-based, Supportive, and Reframing  Therapist Response: Clinician led check-in regarding current stressors and situation, and review of patient completed daily inventory. Clinician utilized active listening and empathetic response and validated patient emotions. Clinician facilitated processing group on pertinent issues.?   Summary: Patient arrived within time allowed. Patient rates their depression at a 3 and anxiety at a 5 on a scale of 1-10 with 10 being high. Pt reports they slept 6 hours last night and ate 2x yesterday. Pt reports stress re: ongoing process pf housing aid. Pt reports need to get his birth certificate from a courthouse 2.5 hours away and is worried he may have to drive to get it. Pt is able to problem solve with group. Pt shares struggle with shame and the roots it has deep. Pt able to process.?Pt engaged in discussion.?      Session Time: 10:00 am - 11:00 am  Participation Level: Active  Behavioral Response: CasualAlertAnxious and Depressed  Type of Therapy: Group Therapy  Treatment Goals addressed: Coping  Progress Towards Goals: Progressing  Interventions: CBT, DBT, Solution Focused, Strength-based, Supportive, and Reframing  Therapist Response: Cln led processing group for pt's current struggles. Group members shared stressors and provided support and feedback. Cln brought in topics of boundaries, healthy relationships, and unhealthy thought processes to inform discussion.    Summary:  Pt able to process and provide support to group.          Session Time: 11:00 am - 12:00 pm   Participation Level: Active    Behavioral Response: CasualAlertAnxious   Type of Therapy: Group Therapy   Treatment Goals addressed: Coping   Progress Towards Goals: Progressing   Interventions: CBT, DBT, Solution Focused, Strength-based, Supportive, and Reframing   Therapist Response: Cln led discussion on spiraling or when our thoughts compound on one another to descend our feelings into worse and worse situations. Group members discussed the ways in which spiraling is difficult for them and when they are especially vulnerable. Cln offered DBT STOP and distraction skills as a way to halt the spiral once you recognize it.    Summary:  Pt engaged in discussion and reports spiraling is a major issue for them. Pt is able to increase awareness of spiraling throughout the discussion.         Session Time: 12:00 pm - 12:50 pm   Participation Level: Active   Behavioral Response: CasualAlertAnxious   Type of Therapy: Group Therapy   Treatment Goals addressed: Coping   Progress Towards Goals: Progressing   Interventions: Occupational Therapy   Therapist Response: Cln E Hollan led occupational therapy group.    Summary: See note           Session Time: 12:50 pm - 1:00 pm   Participation Level: Active   Behavioral Response: CasualAlertAnxious   Type of Therapy: Group Therapy   Treatment Goals addressed: Coping   Progress Towards Goals: Progressing   Interventions: CBT, DBT, Solution Focused, Strength-based, Supportive, and Reframing   Therapist Response: 12:50 pm - 1:00 pm: Clinician led check-out. Clinician assessed for immediate needs, medication compliance and efficacy, and safety concerns?   Summary: 12:50 pm - 1:00 pm: At check-out, patient reports no immediate concerns.  Patient demonstrates progress as evidenced by engagement and responsiveness to treatment. Patient denies SI/HI/self-harm thoughts at the end of group.    Suicidal/Homicidal: Nowithout intent/plan  Plan: ?Pt will continue  in PHP and medication management while continuing to work on decreasing depression symptoms,?SI, and anxiety symptoms,?and increasing the ability to self manage symptoms.     Collaboration of Care: Medication Management AEB Staci Kerns, NP  Patient/Guardian was advised Release of Information must be obtained prior to any record release in order to collaborate their care with an outside provider. Patient/Guardian was advised if they have not already done so to contact the registration department to sign all necessary forms in order for us  to release information regarding their care.   Consent: Patient/Guardian gives verbal consent for treatment and assignment of benefits for services provided during this visit. Patient/Guardian expressed understanding and agreed to proceed.   Diagnosis: Major depressive disorder, recurrent, severe without psychotic features (HCC) [F33.2]    1. Major depressive disorder, recurrent, severe without psychotic features (HCC)   2. Chronic posttraumatic stress disorder   3. GAD (generalized anxiety disorder)       Randall Bastos, LCSW

## 2024-02-26 NOTE — Psych (Signed)
 [facility] BH PHP THERAPIST PROGRESS NOTE  [name] [id number]   Session Time: 9:00 am - 10:00 am  Participation Level: Active  Behavioral Response: CasualAlertAnxious and Depressed  Type of Therapy: Group Therapy  Treatment Goals addressed: Coping  Progress Towards Goals: Progressing  Interventions: CBT, DBT, Solution Focused, Strength-based, Supportive, and Reframing  Therapist Response: Clinician led check-in regarding current stressors and situation, and review of patient completed daily inventory. Clinician utilized active listening and empathetic response and validated patient emotions. Clinician facilitated processing group on pertinent issues.?   Summary: Patient arrived within time allowed. Patient rates their depression at a 1 and anxiety at a 1 on a scale of 1-10 with 10 being high. Pt reports they slept 7.5 hours last night and ate 3x yesterday. Pt reports he has a friend he will be able to stay with until he secures housing and will no longer have to stay in his car. Pt reports it is a safe and healthy environment. Pt states feeling a weight lifted.  Pt able to process.?Pt engaged in discussion.?      Session Time: 10:00 am - 11:00 am  Participation Level: Active  Behavioral Response: CasualAlertAnxious and Depressed  Type of Therapy: Group Therapy  Treatment Goals addressed: Coping  Progress Towards Goals: Progressing  Interventions: CBT, DBT, Solution Focused, Strength-based, Supportive, and Reframing  Therapist Response: Cln led discussion on routine and the role it can play in recovery. Group members shared ways in which routine is helpful and/or difficult for them. Cln encouraged pt's to consider doing the same thing in the same order more than focusing on tasks being set at a specific time.    Summary: Pt engaged in discussion.           Session Time: 11:00 am - 12:00 pm   Participation Level: Active   Behavioral Response: CasualAlertAnxious    Type of Therapy: Group Therapy   Treatment Goals addressed: Coping   Progress Towards Goals: Progressing   Interventions: CBT, DBT, Solution Focused, Strength-based, Supportive, and Reframing   Therapist Response: Cln introduced topic of boundaries. Cln discussed how boundaries inform our relationships and affect self-esteem and personal agency. Group discussed the three types of boundaries: rigid, porous, and healthy and when each type is most helpful/harmful.    Summary: Pt engaged in discussion and is able to identify each type in their life.            Session Time: 12:00 pm - 12:30 pm   Participation Level: Active   Behavioral Response: CasualAlertAnxious   Type of Therapy: Group Therapy   Treatment Goals addressed: Coping   Progress Towards Goals: Progressing   Interventions: Psychologist, Occupational, Supportive   Therapist Response: Reflection Group: Patients encouraged to practice skills and interpersonal techniques or work on mindfulness and relaxation techniques. The importance of self-care and making skills part of a routine to increase usage were stressed.   Summary: Patient engaged and participated appropriately.           Session Time: 12:30 pm - 1:30 pm   Participation Level: Active   Behavioral Response: CasualAlertAnxious   Type of Therapy: Group Therapy   Treatment Goals addressed: Coping   Progress Towards Goals: Progressing   Interventions: Occupational Therapy   Therapist Response: Cln E Hollan led occupational therapy group.    Summary: See note           Session Time: 1:30 pm - 1:45 pm   Participation Level: Active   Behavioral  Response: CasualAlertAnxious   Type of Therapy: Group Therapy   Treatment Goals addressed: Coping   Progress Towards Goals: Progressing   Interventions: CBT, DBT, Solution Focused, Strength-based, Supportive, and Reframing   Therapist Response: 1:30 pm - 1:45 pm: Clinician led check-out.  Clinician assessed for immediate needs, medication compliance and efficacy, and safety concerns?   Summary: 1:30 pm - 1:45 pm: At check-out, patient reports no immediate concerns. Patient demonstrates progress as evidenced by engagement and responsiveness to treatment. Patient denies SI/HI/self-harm thoughts at the end of group.    Suicidal/Homicidal: Nowithout intent/plan  Plan: ?Pt will continue in PHP and medication management while continuing to work on decreasing depression symptoms,?SI, and anxiety symptoms,?and increasing the ability to self manage symptoms.     Collaboration of Care: Medication Management AEB Staci Kerns, NP  Patient/Guardian was advised Release of Information must be obtained prior to any record release in order to collaborate their care with an outside provider. Patient/Guardian was advised if they have not already done so to contact the registration department to sign all necessary forms in order for us  to release information regarding their care.   Consent: Patient/Guardian gives verbal consent for treatment and assignment of benefits for services provided during this visit. Patient/Guardian expressed understanding and agreed to proceed.   Diagnosis: Major depressive disorder, recurrent, severe without psychotic features (HCC) [F33.2]    1. Major depressive disorder, recurrent, severe without psychotic features (HCC)   2. Chronic posttraumatic stress disorder       Dustin Bastos, LCSW

## 2024-02-26 NOTE — Therapy (Signed)
 [facility] [address] [place], [place], [address] Phone: [phone]   Fax:  [phone]  Occupational Therapy Treatment  Patient Details  Name: [name] MRN: [id number] Date of Birth: 1960-10-19 No data recorded  Encounter Date: 02/09/2024   OT End of Session - 02/26/24 2059     Visit Number 11    Number of Visits 15    Date for Recertification  02/10/24    OT Start Time 1230    OT Stop Time 1330    OT Time Calculation (min) 60 min          Past Medical History:  Diagnosis Date   Cancer (HCC)    melanoma;  right ear   Depression    Diabetes mellitus    Diabetes mellitus, type II (HCC)    GERD (gastroesophageal reflux disease)    Hyperlipidemia    Hypertension    Metabolic syndrome    Obesity    PONV (postoperative nausea and vomiting)    YRS AGO WITHER ETHER    Past Surgical History:  Procedure Laterality Date   COLONOSCOPY  05/01/2013   CYST REMOVAL TRUNK  1989   benign   MELANOMA EXCISION  2008   right ear and neck   ORIF HUMERUS FRACTURE Left 12/23/2020   Procedure: OPEN REDUCTION INTERNAL FIXATION (ORIF) PROXIMAL HUMERUS FRACTURE;  Surgeon: [name], MD;  Location: [facility] ORS;  Service: Orthopedics;  Laterality: Left;    POLYPECTOMY     reconstructive plastic surgery face  1967   dog bite at age 13; about 15 surgeries for 10 years   TONSILLECTOMY  1966   age 63    There were no vitals filed for this visit.   Subjective Assessment - 02/26/24 2059     Currently in Pain? No/denies    Pain Score 0-No pain                 Group Session:  S: Doing better today.   O: The objective of today's group is to provide a comprehensive understanding of the concept of motivation and its role in human behavior and well-being. The content covers various theories of motivation, including intrinsic and extrinsic motivators, and explores the psychological mechanisms that drive individuals to achieve  goals, overcome obstacles, and make decisions. By diving into real-world applications, the group aims to offer actionable strategies for enhancing motivation in different life domains, such as work, relationships, and personal growth.  Utilizing a multi-disciplinary approach, this group integrates insights from psychology, neuroscience, and behavioral economics to present a holistic view of motivation. The objective is not only to educate the audience about the complexities and driving forces behind motivation but also to equip them with practical tools and techniques to improve their own motivation levels. By the end of this multi-day group, patient's should have a well-rounded understanding of what motivates human actions and how to harness this knowledge for personal and professional betterment.   A: The patient demonstrates a high level of engagement during the session, actively participating in discussions about motivation theories and their applicability to their own life. They show keen interest in learning new strategies to improve their motivation and even offer examples from their own experiences that align with the theories presented. Their level of self-awareness and willingness to invest in self-improvement suggest that they are well-positioned to benefit from the practical tools and techniques discussed. The patient's ability to articulate their goals and challenges further supports the likelihood  of successfully implementing the strategies presented.    P: Continue to attend PHP OT group sessions 5x week for 4 weeks to promote daily structure, social engagement, and opportunities to develop and utilize adaptive strategies to maximize functional performance in preparation for safe transition and integration back into school, work, and the community. Plan to address topic of tbd in next OT group session.               OT Education - 02/26/24 2059     Education Details Motivation            OT Short Term Goals - 01/25/24 2132       OT SHORT TERM GOAL #1   Title By the time of discharge, client will independently set, track, and make progress towards a long-term goal, demonstrating resilience in overcoming obstacles and seeking support when needed.    Time 3    Period Weeks    Status On-going    Target Date 02/10/24      OT SHORT TERM GOAL #2   Title Client will independently identify and modify three areas of the current routine that contribute to increased stress or dysfunction by the end of therapy.    Status On-going      OT SHORT TERM GOAL #3   Title Client will independently identify and list three personal triggers that lead to heightened stress or negative emotional responses by the end of 3 sessions.    Status On-going      OT SHORT TERM GOAL #4   Title Client will demonstrate the use of at least two different relaxation techniques (e.g., deep breathing, progressive muscle relaxation) without prompting when faced with a stressful situation in four out of five trials.    Status On-going                   Plan - 02/26/24 2100     Psychosocial Skills Interpersonal Interaction;Habits;Environmental  Adaptations;Coping Strategies;Routines and Behaviors          Patient will benefit from skilled therapeutic intervention in order to improve the following deficits and impairments:       Psychosocial Skills: Interpersonal Interaction, Habits, Environmental  Adaptations, Coping Strategies, Routines and Behaviors   Visit Diagnosis: Difficulty coping    Problem List Patient Active Problem List   Diagnosis Date Noted   Major depressive disorder, recurrent, severe without psychotic features (HCC) 12/28/2023   Major depressive disorder, recurrent episode, severe, with psychotic behavior (HCC) 11/06/2023   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 05/11/2023   Severe alcohol  use disorder (HCC) 05/09/2023   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 R ear.  12/16/2006   Major depression, recurrent, full remission 12/16/2006   Hyperlipidemia associated with type 2 diabetes mellitus (HCC) 11/07/2006    [place] KANDICE Purpura, OT 02/26/2024, 9:00 PM  Dallas Purpura, OT   [facility] [address] [place], [place], [address] Phone: [phone]   Fax:  [phone]  Name: CANDEN CIESLINSKI MRN: [id number] Date of Birth: Sep 16, 1960

## 2024-02-26 NOTE — Psych (Signed)
 [facility] PHP THERAPIST PROGRESS NOTE  JESTIN BURBACH [id number]   Session Time: 9:00 am - 10:00 am  Participation Level: Active  Behavioral Response: CasualAlertAnxious and Depressed  Type of Therapy: Group Therapy  Treatment Goals addressed: Coping  Progress Towards Goals: Progressing  Interventions: CBT, DBT, Solution Focused, Strength-based, Supportive, and Reframing  Therapist Response: Clinician led check-in regarding current stressors and situation, and review of patient completed daily inventory. Clinician utilized active listening and empathetic response and validated patient emotions. Clinician facilitated processing group on pertinent issues.?   Summary: Patient arrived within time allowed. Patient rates their depression at a 1 and anxiety at a 1 on a scale of 1-10 with 10 being high. Pt reports they slept 8.5 hours last night and ate 3x yesterday. Pt reports he had a good day yesterday and was able to work on his laundry, cook dinner, and sleep well. Pt reports continued increase in mood due to having a stable place to stay. Pt reports goal of decreasing buried shame now that he is more stable practically.  Pt able to process.?Pt engaged in discussion.?      Session Time: 10:00 am - 11:00 am  Participation Level: Active  Behavioral Response: CasualAlertAnxious and Depressed  Type of Therapy: Group Therapy  Treatment Goals addressed: Coping  Progress Towards Goals: Progressing  Interventions: CBT, DBT, Solution Focused, Strength-based, Supportive, and Reframing  Therapist Response: Cln led discussion on communicating our struggles with our support team. Cln introduced the concept of giving disclosures to the people close to us  regarding the way we act in specific situations or the way we process certain stimuli. Group members discussed ways to provide this road map to our brain and in what situations this may be helpful to them.   Summary: Pt engaged in  discussion and is able to determine situations in which providing a disclosure can be helpful and practiced doing so.          Session Time: 11:00 am - 12:00 pm   Participation Level: Active   Behavioral Response: Casual Alert and Anxious/Depressed   Type of Therapy: Group Therapy   Treatment Goals addressed: Coping   Progress Towards Goals: Progressing   Interventions: CBT, DBT, Solution Focused, Strength-based, Supportive, and Reframing   Therapist Response: Cln continued topic of boundaries. Cln discussed the different ways boundaries present: physical, emotional, intellectual, sexual, material, and time. Group talked about the ways in which each type presents for them and is a struggle. Cln introduced how to set and maintain healthy boundaries and group members worked through examples to practice setting appropriate boundaries.    Summary: Pt engaged in discussion and is able to identify most problematic areas.            Session Time: 12:00 pm - 12:50 pm   Participation Level: Active   Behavioral Response: Casual Alert and Anxious/Depressed   Type of Therapy: Group Therapy   Treatment Goals addressed: Coping   Progress Towards Goals: Progressing   Interventions: OT group   Therapist Response: Group was led by occupational therapist, Edward Hollan.    Summary: Pt engaged in discussion.            Session Time: 12:50 pm - 1:00 pm   Participation Level: Active   Behavioral Response: CasualAlertDepressedandAnxious   Type of Therapy: Group Therapy   Treatment Goals addressed: Coping   Progress Towards Goals: Progressing   Interventions: CBT, DBT, Solution Focused, Strength-based, Supportive, and Reframing   Therapist Response: 12:50-1:00pm:  Clinician led check-out. Clinician assessed for immediate needs, medication compliance and efficacy, and safety concerns?   Summary: 12:50-1:00pm: At check-out, patient reports no immediate concerns. Patient  demonstrates progress as evidenced by engagement and responsiveness to treatment. Patient denies SI/HI/self-harm thoughts at the end of group.    Suicidal/Homicidal: Nowithout intent/plan  Plan: ?Pt will discharge from PHP due to meeting treatment goals of decreasing depression symptoms,?SI, and anxiety symptoms,?and increasing the ability to self manage symptoms. Pt will return to previous outpatient providers within this agency for continuing care. Pt and provider are aligned with discharge plan. Pt denies SI/HI at time of discharge.     Collaboration of Care: Medication Management AEB Staci Kerns, NP  Patient/Guardian was advised Release of Information must be obtained prior to any record release in order to collaborate their care with an outside provider. Patient/Guardian was advised if they have not already done so to contact the registration department to sign all necessary forms in order for us  to release information regarding their care.   Consent: Patient/Guardian gives verbal consent for treatment and assignment of benefits for services provided during this visit. Patient/Guardian expressed understanding and agreed to proceed.   Diagnosis: Major depressive disorder, recurrent, severe without psychotic features (HCC) [F33.2]    1. Major depressive disorder, recurrent, severe without psychotic features (HCC)   2. Chronic posttraumatic stress disorder       Randall Bastos, LCSW

## 2024-02-26 NOTE — Therapy (Signed)
 [facility] [address] [place], [place], [address] Phone: [phone]   Fax:  [phone]  Occupational Therapy Treatment  Patient Details  Name: [name] MRN: [id number] Date of Birth: Jan 29, 1961 No data recorded  Encounter Date: 02/10/2024   OT End of Session - 02/26/24 2111     Visit Number 12    Number of Visits 15    Date for Recertification  02/10/24    OT Start Time 1200    OT Stop Time 1250    OT Time Calculation (min) 50 min          Past Medical History:  Diagnosis Date   Cancer (HCC)    melanoma;  right ear   Depression    Diabetes mellitus    Diabetes mellitus, type II (HCC)    GERD (gastroesophageal reflux disease)    Hyperlipidemia    Hypertension    Metabolic syndrome    Obesity    PONV (postoperative nausea and vomiting)    YRS AGO WITHER ETHER    Past Surgical History:  Procedure Laterality Date   COLONOSCOPY  05/01/2013   CYST REMOVAL TRUNK  1989   benign   MELANOMA EXCISION  2008   right ear and neck   ORIF HUMERUS FRACTURE Left 12/23/2020   Procedure: OPEN REDUCTION INTERNAL FIXATION (ORIF) PROXIMAL HUMERUS FRACTURE;  Surgeon: [name], MD;  Location: [facility];  Service: Orthopedics;  Laterality: Left;    POLYPECTOMY     reconstructive plastic surgery face  1967   dog bite at age 69; about 15 surgeries for 10 years   TONSILLECTOMY  1966   age 77    There were no vitals filed for this visit.   Subjective Assessment - 02/26/24 2110     Currently in Pain? No/denies    Pain Score 0-No pain              Group Session:  S: Better today, sleeping better last night helped a lot.   O: The objective of today's group is to provide a comprehensive understanding of the concept of motivation and its role in human behavior and well-being. The content covers various theories of motivation, including intrinsic and extrinsic motivators, and explores the psychological mechanisms that  drive individuals to achieve goals, overcome obstacles, and make decisions. By diving into real-world applications, the group aims to offer actionable strategies for enhancing motivation in different life domains, such as work, relationships, and personal growth.  Utilizing a multi-disciplinary approach, this group integrates insights from psychology, neuroscience, and behavioral economics to present a holistic view of motivation. The objective is not only to educate the audience about the complexities and driving forces behind motivation but also to equip them with practical tools and techniques to improve their own motivation levels. By the end of this multi-day group, patient's should have a well-rounded understanding of what motivates human actions and how to harness this knowledge for personal and professional betterment.   A: The patient demonstrates a high level of engagement during the session, actively participating in discussions about motivation theories and their applicability to their own life. They show keen interest in learning new strategies to improve their motivation and even offer examples from their own experiences that align with the theories presented. Their level of self-awareness and willingness to invest in self-improvement suggest that they are well-positioned to benefit from the practical tools and techniques discussed. The patient's ability to articulate their goals and challenges further  supports the likelihood of successfully implementing the strategies presented.   P: Continue to attend PHP OT group sessions 5x week for 4 weeks to promote daily structure, social engagement, and opportunities to develop and utilize adaptive strategies to maximize functional performance in preparation for safe transition and integration back into school, work, and the community. Plan to address topic of tbd in next OT group session.                    OT Education - 02/26/24  2110     Education Details Motivation           OT Short Term Goals - 01/25/24 2132       OT SHORT TERM GOAL #1   Title By the time of discharge, client will independently set, track, and make progress towards a long-term goal, demonstrating resilience in overcoming obstacles and seeking support when needed.    Time 3    Period Weeks    Status On-going    Target Date 02/10/24      OT SHORT TERM GOAL #2   Title Client will independently identify and modify three areas of the current routine that contribute to increased stress or dysfunction by the end of therapy.    Status On-going      OT SHORT TERM GOAL #3   Title Client will independently identify and list three personal triggers that lead to heightened stress or negative emotional responses by the end of 3 sessions.    Status On-going      OT SHORT TERM GOAL #4   Title Client will demonstrate the use of at least two different relaxation techniques (e.g., deep breathing, progressive muscle relaxation) without prompting when faced with a stressful situation in four out of five trials.    Status On-going                   Plan - 02/26/24 2111     Psychosocial Skills Interpersonal Interaction;Habits;Environmental  Adaptations;Coping Strategies;Routines and Behaviors          Patient will benefit from skilled therapeutic intervention in order to improve the following deficits and impairments:       Psychosocial Skills: Interpersonal Interaction, Habits, Environmental  Adaptations, Coping Strategies, Routines and Behaviors   Visit Diagnosis: Difficulty coping    Problem List Patient Active Problem List   Diagnosis Date Noted   Major depressive disorder, recurrent, severe without psychotic features (HCC) 12/28/2023   Major depressive disorder, recurrent episode, severe, with psychotic behavior (HCC) 11/06/2023   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 05/11/2023   Severe alcohol  use disorder (HCC) 05/09/2023    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 R ear.  12/16/2006   Major depression, recurrent, full remission 12/16/2006   Hyperlipidemia associated with type 2 diabetes mellitus (HCC) 11/07/2006    Dallas KANDICE Purpura, OT 02/26/2024, 9:11 PM  Dallas Purpura, OT   [facility] [address],  [place], [address] Phone: [phone]   Fax:  [phone]  Name: [name] MRN: [id number] Date of Birth: 1960-11-07

## 2024-02-29 ENCOUNTER — Ambulatory Visit (HOSPITAL_COMMUNITY): Admitting: Licensed Clinical Social Worker

## 2024-02-29 ENCOUNTER — Encounter (HOSPITAL_COMMUNITY): Payer: Self-pay

## 2024-02-29 NOTE — Progress Notes (Signed)
 THERAPIST PROGRESS NOTE   Session Date: 02/29/2024  Session Time: 1500  [name]    Clinician attempted to connect with patient for scheduled appointment via Caregility video, sending text request x4 with no response.     Attempt 1: Text: 1511  Attempt 2: Text: 1512   Attempt 3: Text: 1513  Attempt 3: Text: 1514   Disconnected video visit at:  1515     Per [facility] policy, after multiple attempts to reach patient unsuccessfully at appointed time, visit will be coded as a no show.  Lynwood JONETTA Maris, MSW, LCSW 02/29/2024,  3:35 PM

## 2024-03-07 ENCOUNTER — Other Ambulatory Visit (HOSPITAL_COMMUNITY): Payer: Self-pay | Admitting: Family

## 2024-03-07 DIAGNOSIS — F332 Major depressive disorder, recurrent severe without psychotic features: Secondary | ICD-10-CM

## 2024-03-12 ENCOUNTER — Encounter (HOSPITAL_COMMUNITY): Payer: Self-pay | Admitting: Licensed Clinical Social Worker

## 2024-03-12 ENCOUNTER — Ambulatory Visit (INDEPENDENT_AMBULATORY_CARE_PROVIDER_SITE_OTHER): Admitting: Licensed Clinical Social Worker

## 2024-03-12 DIAGNOSIS — F411 Generalized anxiety disorder: Secondary | ICD-10-CM | POA: Diagnosis not present

## 2024-03-12 DIAGNOSIS — F332 Major depressive disorder, recurrent severe without psychotic features: Secondary | ICD-10-CM | POA: Diagnosis not present

## 2024-03-12 DIAGNOSIS — F4312 Post-traumatic stress disorder, chronic: Secondary | ICD-10-CM | POA: Diagnosis not present

## 2024-03-12 NOTE — Progress Notes (Signed)
 THERAPIST PROGRESS NOTE   Session Date: 03/12/2024  Session Time: 1105 - 1137  Participation Level: Active  MSE/Presentation: Behavior: Appropriate and Sharing Speech: Normal Thought Process: Coherent and Relevant Cognition: Alert, Appropriate, and Oriented Mood: Anxious and Euthymic Affect: Appropriate and Congruent Insight: Good Appearance: Casual  Type of Therapy: Individual Therapy  Treatment Goals addressed:   Initial (8) LTG: Increase coping skills to manage depression and improve ability to perform daily activities (OP Depression) STG: Reduce overall depression score by a minimum of 25% on the Patient Health Questionnaire (PHQ-9) (OP Depression) STG: [name] will identify cognitive patterns and beliefs that support depression (OP Depression) LTG: Gain better control of, and resiliency in response to, periods of depressed moods and symptoms, AEB improved understanding and management of them, and moving forward throughout my day (OP Depression) LTG: Recall traumatic events without becoming overwhelmed with negative emotions (BH CCP Acute or Chronic Trauma Reaction) LTG: Be able to better understand what happened to me in the past, and come up with a framework of dealing with that, so that it doesn't debilitate me and/or result in experiencing a lot of negative thoughts about myself (BH CCP Acute or Chronic Trauma Reaction) STG: Report a decrease in anxiety symptoms as evidenced by an overall reduction in anxiety score by a minimum of 25% on the Generalized Anxiety Disorder Scale (GAD-7) (Anxiety) LTG: Improve abilities to better compartmentalize what it is that I'm worrying about and put things aside, as typically they're related to things that are outside of my control (Anxiety)  Progress Towards Goals: Progressing  Interventions: CBT, Motivational Interviewing, Solution Focused, Strength-based, and Supportive  Summary: [name] is a(n) 63 y.o. male,  with psych hx consistent with MDD, severe, recurrent, CPTSD, and GAD, presenting for follow-up therapy session in efforts to improve management of presenting symptoms.   Patient openly engaged in session, presenting with mildly anxious yet pleasant mood and congruent affect. During check-in, patient apologized for missing prior appointment, reporting recent respiratory illness and use of medications that resulted in medication-sedation and sleeping through scheduled visit.  PHQ-9 and GAD-7 were re-administered, reflecting a mild increase in depressive symptoms, identifying contributing factors, including frustration with current circumstances and self-attributed responsibility for present stressors, particularly housing instability. Patient reported recently leaving prior living arrangement due to friends family needs and is currently couch surfing, with anticipated stabilization of housing by mid-week pending receipt of funds for a deposit.  Engaged in exploration of available supports, including church members, pastor, and crisis support services. Further explorated family relationships, noting continued no-contact with sisters. Patient explored beliefs that siblings hate him, acknowledging limited direct confirmation aside from one sister. These perceptions were linked to increased self-criticism, shame, and negative self-concept related to past behaviors.  Pt encountered technical barriers that resulting in losing connection for visit. Clinician relayed next scheduled appt via Patient Message, sent directly to pt MyChart.     03/12/2024   11:10 AM 02/21/2024    2:13 PM 02/06/2024    2:44 PM 01/30/2024    2:23 PM  GAD 7 : Generalized Anxiety Score  Nervous, Anxious, on Edge 0 0 0 0  Control/stop worrying 1 1 0 2  Worry too much - different things 1 1 1 1   Trouble relaxing 0 0 0 3  Restless 0 0 0 0  Easily annoyed or irritable 1 1 1 1   Afraid - awful might happen 1 2 1 2   Total GAD 7  Score 4 5 3  9  Anxiety Difficulty Somewhat difficult Somewhat difficult Somewhat difficult Somewhat difficult      03/12/2024   11:12 AM 02/21/2024    2:14 PM 02/13/2024    1:18 PM 02/07/2024   11:25 AM 02/06/2024    2:43 PM  Depression screen PHQ 2/9  Decreased Interest 1 0 0 1 1  Down, Depressed, Hopeless 1 0 0 1 1  PHQ - 2 Score 2 0 0 2 2  Altered sleeping 0 0  1 1  Tired, decreased energy 1 0  0 2  Change in appetite 0 1  0 1  Feeling bad or failure about yourself  3 3  3 3   Trouble concentrating 0 0  0 0  Moving slowly or fidgety/restless 0 0  0 0  Suicidal thoughts 1 1  1 1   PHQ-9 Score 7 5  7 10   Difficult doing work/chores Not difficult at all Somewhat difficult   Somewhat difficult   Flowsheet Row Counselor from 02/21/2024 in [facility] Counselor from 01/24/2024 in [facility] Counselor from 01/12/2024 in [facility] RISK CATEGORY Moderate Risk High Risk High Risk    Suicidal/Homicidal: None, No plan to harm self or others  Therapist Response:   Clinician actively greeted pt upon presenting for visit, assessing presenting moods and affect, engaging in check-in, utilizing open ended questions in eliciting recounts of events, and actively listening to pt's recounts, and identified challenges. Clinician utilized CBT-oriented exploration of anxious thought patterns, supportive reflection, psychoeducation on attachment and communication, and use of open-ended questions to facilitate emotional processing. Clinician reassessed severity of presenting sxs, and presence of any safety concerns.  [x]  Cognitive Challenging [x]  Cognitive Refocusing []  Cognitive Reframing  []  Communication Skills []  Compliance Issues []  DBT [x]  Exploration of Coping Patterns [x]  Exploration of Emotions [x]  Exploration of Relationship Patterns []  Guided Imagery []  Interactive  Feedback []  Interpersonal Resolutions []  Mindfulness Training []  Preventative Services [x]  Psycho-Education  []  Relaxation/Deep Breathing []  Review of Treatment Plan/Progress []  Role-Play/Behavioral Rehearsal  [x]  Structured Problem Solving [x]  Supportive Reflection []  Symptom Management  []  Other   Patient responded well to interventions. Patient continues to meet criteria for MDD, severe, recurrent, CPTSD, and GAD. Patient will continue to benefit from engagement in outpatient therapy due to being the least restrictive service to meet presenting needs.    Homework: None.  Plan: Return again in 3 weeks.   Diagnosis:  Encounter Diagnoses  Name Primary?   Major depressive disorder, recurrent, severe without psychotic features (HCC) Yes   Chronic posttraumatic stress disorder    GAD (generalized anxiety disorder)      Collaboration of Care: Psychiatrist AEB provider documentation available in EHR.  Patient/Guardian was advised Release of Information must be obtained prior to any record release in order to collaborate their care with an outside provider. Patient/Guardian was advised if they have not already done so to contact the registration department to sign all necessary forms in order for us  to release information regarding their care.   Consent: Patient/Guardian gives verbal consent for treatment and assignment of benefits for services provided during this visit. Patient/Guardian expressed understanding and agreed to proceed.   Virtual Visit via Video Note  I connected with [name] on 03/12/2024 at 11:00 AM EST by a video enabled telemedicine application and verified that I am speaking with the correct person using two identifiers.  Location: Patient: Friend's residence Provider: Home office  I discussed the limitations of evaluation and management by telemedicine and the availability of in person appointments. The patient expressed understanding and agreed to  proceed.  I discussed the assessment and treatment plan with the patient. The patient was provided an opportunity to ask questions and all were answered. The patient agreed with the plan and demonstrated an understanding of the instructions.   The patient was advised to call back or seek an in-person evaluation if the symptoms worsen or if the condition fails to improve as anticipated.  I provided 32 minutes of non-face-to-face time during this encounter.   Lynwood JONETTA Maris, MSW, LCSW 03/12/2024,  11:12 AM

## 2024-03-13 ENCOUNTER — Other Ambulatory Visit: Payer: Self-pay

## 2024-03-19 ENCOUNTER — Other Ambulatory Visit: Payer: Self-pay
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
## 2024-04-03 ENCOUNTER — Ambulatory Visit (HOSPITAL_COMMUNITY): Admitting: Licensed Clinical Social Worker

## 2024-04-03 DIAGNOSIS — F411 Generalized anxiety disorder: Secondary | ICD-10-CM | POA: Diagnosis not present

## 2024-04-03 DIAGNOSIS — F332 Major depressive disorder, recurrent severe without psychotic features: Secondary | ICD-10-CM | POA: Diagnosis not present

## 2024-04-03 DIAGNOSIS — F4312 Post-traumatic stress disorder, chronic: Secondary | ICD-10-CM | POA: Diagnosis not present

## 2024-04-03 NOTE — Progress Notes (Signed)
 THERAPIST PROGRESS NOTE   Session Date: 04/03/2024  Session Time: 1110 - 1200  Participation Level: Active  MSE/Presentation: Behavior: Appropriate and Sharing Speech: Normal Thought Process: Coherent and Relevant Cognition: Alert, Appropriate, and Oriented Mood: Anxious, Depressed, and Euthymic Affect: Appropriate and Congruent Insight: Good Appearance: Casual  Type of Therapy: Individual Therapy  Treatment Goals addressed:   Initial (8) LTG: Increase coping skills to manage depression and improve ability to perform daily activities (OP Depression) STG: Reduce overall depression score by a minimum of 25% on the Patient Health Questionnaire (PHQ-9) (OP Depression) STG: [name] will identify cognitive patterns and beliefs that support depression (OP Depression) LTG: Gain better control of, and resiliency in response to, periods of depressed moods and symptoms, AEB improved understanding and management of them, and moving forward throughout my day (OP Depression) LTG: Recall traumatic events without becoming overwhelmed with negative emotions (BH CCP Acute or Chronic Trauma Reaction) LTG: Be able to better understand what happened to me in the past, and come up with a framework of dealing with that, so that it doesn't debilitate me and/or result in experiencing a lot of negative thoughts about myself (BH CCP Acute or Chronic Trauma Reaction) STG: Report a decrease in anxiety symptoms as evidenced by an overall reduction in anxiety score by a minimum of 25% on the Generalized Anxiety Disorder Scale (GAD-7) (Anxiety) LTG: Improve abilities to better compartmentalize what it is that I'm worrying about and put things aside, as typically they're related to things that are outside of my control (Anxiety)  Progress Towards Goals: Progressing  Interventions: CBT, Motivational Interviewing, Solution Focused, Strength-based, and Supportive  Summary: [name] is a(n) 64  y.o. male, with psych hx consistent with MDD, severe, recurrent, CPTSD, and GAD, presenting for follow-up therapy session in efforts to improve management of presenting symptoms.   Patient openly engaged in session, presenting with mildly anxious and depressed, yet pleasant moods and congruent affect. During check-in, patient shared to be at friends home, currently residing with friend for the past month, revisiting prior plans to move into new place as mentioned in previous visit, noting of unexpected bills coming up and proving to lose planned apartment last month, and viewing a new apartment today in hopes of being able to secure by Friday when receiving social security check.  Expressed feeling increasingly anxious about getting into own place and decreasing the burden that he proves to be placing on friends by staying at their residences.   Engaged in reassessing presenting anxious and depressive sxs via GAD-7 and PHQ-9, noting of observed increases in both anxious and depressive sxs, attributing to primarily being alone for Christmas, and increased stress related to readiness of securing own place.  Explored ways in which pt has proven to manage sxs and moods, noting of distracting self with a book, reading online, and watching TV to help take mind of things. Have been reading recent hx of the collapse of western civilization, and noting things that he has been watching may not be supportive in improving moods, specifically, crime/horror shows on Netflix.  Explored how to improve moods by mixing in more positive TV and books that he reads. Shared of watching animal shows on Youtube, and possibly exploring funny videos to improve moods.  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 6-8  months ago. Explored  additional opportunities for volunteering with local community animal rescues, identifying [employer] and other rescues in the area.     04/03/2024   11:19 AM 03/12/2024   11:10 AM 02/21/2024    2:13 PM 02/06/2024    2:44 PM  GAD 7 : Generalized Anxiety Score  Nervous, Anxious, on Edge 2 0 0 0  Control/stop worrying 2 1 1  0  Worry too much - different things 2 1 1 1   Trouble relaxing 0 0 0 0  Restless 0 0 0 0  Easily annoyed or irritable 2 1 1 1   Afraid - awful might happen 3 1 2 1   Total GAD 7 Score 11 4 5 3   Anxiety Difficulty Somewhat difficult Somewhat difficult Somewhat difficult Somewhat difficult      04/03/2024   11:22 AM 03/12/2024   11:12 AM 02/21/2024    2:14 PM 02/13/2024    1:18 PM 02/07/2024   11:25 AM  Depression screen PHQ 2/9  Decreased Interest 3 1 0 0 1  Down, Depressed, Hopeless 2 1 0 0 1  PHQ - 2 Score 5 2 0 0 2  Altered sleeping 0 0 0  1  Tired, decreased energy 0 1 0  0  Change in appetite 1 0 1  0  Feeling bad or failure about yourself  3 3 3  3   Trouble concentrating 0 0 0  0  Moving slowly or fidgety/restless 1 0 0  0  Suicidal thoughts 2 1 1  1   PHQ-9 Score 12 7 5  7   Difficult doing work/chores Somewhat difficult Not difficult at all Somewhat difficult     Flowsheet Row Counselor from 04/03/2024 in [facility] from 02/21/2024 in [facility] [place] Counselor from 01/24/2024 in [facility]  C-SSRS RISK CATEGORY Moderate Risk Moderate Risk High Risk    Suicidal/Homicidal: None, No plan to harm self or others Reports pSI over recent days; Attributed to The holidays in part, not being around them at all; My usual hatred of myself really. Contracted for safety.  Therapist Response:  Clinician actively greeted pt upon presenting for visit, assessing presenting moods and affect, engaging in check-in, utilizing open ended questions in  eliciting recounts of events of the past three weeks, and actively listening to pt's recounts, and identified challenges surrounding living arrangements, holidays, and implications on moods. Clinician utilized CBT, MI, supportive reflection, psychoeducation, and solution focused techniques to support emotional processing. Clinician reassessed severity of presenting sxs, and presence of any safety concerns, reviewing Suicide Safety information and availability of resources.  [x]  Cognitive Challenging [x]  Cognitive Refocusing []  Cognitive Reframing  []  Communication Skills []  Compliance Issues []  DBT [x]  Exploration of Coping Patterns [x]  Exploration of Emotions []  Exploration of Relationship Patterns []  Guided Imagery []  Interactive Feedback []  Interpersonal Resolutions []  Mindfulness Training [x]  Preventative Services [x]  Psycho-Education  []  Relaxation/Deep Breathing []  Review of Treatment Plan/Progress []  Role-Play/Behavioral Rehearsal  [x]  Structured Problem Solving [x]  Supportive Reflection [x]  Symptom Management  []  Other   Patient responded well to interventions. Patient continues to meet criteria for MDD, severe, recurrent, CPTSD, and GAD. Patient will continue to benefit from engagement in outpatient therapy due to being the least restrictive service to meet presenting needs.    Homework: None.  Plan: Return again in 1 weeks.   Diagnosis:  Encounter Diagnoses  Name Primary?   Major depressive disorder, recurrent, severe without psychotic  features (HCC) Yes   GAD (generalized anxiety disorder)    Chronic posttraumatic stress disorder    Collaboration of Care: Psychiatrist AEB provider documentation available in EHR.  Patient/Guardian was advised Release of Information must be obtained prior to any record release in order to collaborate their care with an outside provider. Patient/Guardian was advised if they have not already done so to contact the registration department to sign  all necessary forms in order for us  to release information regarding their care.   Consent: Patient/Guardian gives verbal consent for treatment and assignment of benefits for services provided during this visit. Patient/Guardian expressed understanding and agreed to proceed.   Virtual Visit via Video Note  I connected with [name] on 04/03/2024 at 11:00 AM EST by a video enabled telemedicine application and verified that I am speaking with the correct person using two identifiers.  Location: Patient: Friend's residence Provider: Home office   I discussed the limitations of evaluation and management by telemedicine and the availability of in person appointments. The patient expressed understanding and agreed to proceed.  I discussed the assessment and treatment plan with the patient. The patient was provided an opportunity to ask questions and all were answered. The patient agreed with the plan and demonstrated an understanding of the instructions.   The patient was advised to call back or seek an in-person evaluation if the symptoms worsen or if the condition fails to improve as anticipated.  I provided 50 minutes of non-face-to-face time during this encounter.   Lynwood JONETTA Maris, MSW, LCSW 04/03/2024,  11:11 AM

## 2024-04-04 ENCOUNTER — Other Ambulatory Visit: Payer: Self-pay | Admitting: Family Medicine

## 2024-04-04 ENCOUNTER — Other Ambulatory Visit: Payer: Self-pay

## 2024-04-10 ENCOUNTER — Ambulatory Visit (INDEPENDENT_AMBULATORY_CARE_PROVIDER_SITE_OTHER): Admitting: Licensed Clinical Social Worker

## 2024-04-10 DIAGNOSIS — F332 Major depressive disorder, recurrent severe without psychotic features: Secondary | ICD-10-CM

## 2024-04-10 DIAGNOSIS — F411 Generalized anxiety disorder: Secondary | ICD-10-CM

## 2024-04-10 DIAGNOSIS — F4312 Post-traumatic stress disorder, chronic: Secondary | ICD-10-CM

## 2024-04-10 NOTE — Progress Notes (Unsigned)
 THERAPIST PROGRESS NOTE   Session Date: 04/10/2024  Session Time: 1012 - 1103  Participation Level: Active  MSE/Presentation: Behavior: Appropriate and Sharing Speech: Normal Thought Process: Coherent and Relevant Cognition: Alert, Appropriate, and Oriented Mood: Anxious, Depressed, and Euthymic Affect: Appropriate and Congruent Insight: Good Appearance: Casual  Type of Therapy: Individual Therapy  Treatment Goals addressed:   Initial (8) LTG: Increase coping skills to manage depression and improve ability to perform daily activities (OP Depression) STG: Reduce overall depression score by a minimum of 25% on the Patient Health Questionnaire (PHQ-9) (OP Depression) STG: [name] will identify cognitive patterns and beliefs that support depression (OP Depression) LTG: Gain better control of, and resiliency in response to, periods of depressed moods and symptoms, AEB improved understanding and management of them, and moving forward throughout my day (OP Depression) LTG: Recall traumatic events without becoming overwhelmed with negative emotions (BH CCP Acute or Chronic Trauma Reaction) LTG: Be able to better understand what happened to me in the past, and come up with a framework of dealing with that, so that it doesn't debilitate me and/or result in experiencing a lot of negative thoughts about myself (BH CCP Acute or Chronic Trauma Reaction) STG: Report a decrease in anxiety symptoms as evidenced by an overall reduction in anxiety score by a minimum of 25% on the Generalized Anxiety Disorder Scale (GAD-7) (Anxiety) LTG: Improve abilities to better compartmentalize what it is that I'm worrying about and put things aside, as typically they're related to things that are outside of my control (Anxiety)  Progress Towards Goals: Progressing  Interventions: CBT, Motivational Interviewing, Solution Focused, Strength-based, and Supportive  Summary: [name] is a(n) 64  y.o. male, with psych hx consistent with MDD, severe, recurrent, CPTSD, and GAD, presenting for follow-up therapy session in efforts to improve management of presenting symptoms.   Patient openly engaged in session, presenting with mildly depressed, yet pleasant moods and congruent affect. During check-in, patient shared of Doing better today, further detailing of having experienced some challenges with depression over the past weekend, further exploring contributing factors, identifying a triggering event being of learning of mother's dog having passed on May 08, 2024. Processed challenges surrounding distant relationship with mother and sisters, expressing challenges with relationships with sisters proving to impact maintained connections and visiting mother due to their close proximity to mothers home. Processed how pt can prove to navigate such barriers in exploring abilities to visit mother, role-playing scenario in which pt will reach out to sisters and relay intent to visit mother and express openness for them to be present, and understanding their avoidance if they so desire. Expressed intent to text mother and explore opportunities to potentially visit.  Engaged in     to be at friends home, currently residing with friend for the past month, revisiting prior plans to move into new place as mentioned in previous visit, noting of unexpected bills coming up and proving to lose planned apartment last month, and viewing a new apartment today in hopes of being able to secure by Friday when receiving social security check.  Expressed feeling increasingly anxious about getting into own place and decreasing the burden that he proves to be placing on friends by staying at their residences.   Engaged in reassessing presenting anxious and depressive sxs via GAD-7 and PHQ-9, noting of observed increases in both anxious and depressive sxs, attributing to primarily being alone for Christmas, and increased stress  related to readiness of securing own place.  Explored ways  in which pt has proven to manage sxs and moods, noting of distracting self with a book, reading online, and watching TV to help take mind of things. Have been reading recent hx of the collapse of western civilization, and noting things that he has been watching may not be supportive in improving moods, specifically, crime/horror shows on Netflix.  Explored how to improve moods by mixing in more positive TV and books that he reads. Shared of watching animal shows on Youtube, and possibly exploring funny videos to improve moods.  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 6-8 months ago. Explored additional opportunities for volunteering with local community animal rescues, identifying [employer] and other rescues in the area.     04/03/2024   11:19 AM 03/12/2024   11:10 AM 02/21/2024    2:13 PM 02/06/2024    2:44 PM  GAD 7 : Generalized Anxiety Score  Nervous, Anxious, on Edge 2 0 0 0  Control/stop worrying 2 1 1  0  Worry too much - different things 2 1 1 1   Trouble relaxing 0 0 0 0  Restless 0 0 0 0  Easily annoyed or irritable 2 1 1 1   Afraid - awful might happen 3 1 2 1   Total GAD 7 Score 11 4 5 3   Anxiety Difficulty Somewhat difficult Somewhat difficult Somewhat difficult Somewhat difficult      04/03/2024   11:22 AM 03/12/2024   11:12 AM 02/21/2024    2:14 PM 02/13/2024    1:18 PM 02/07/2024   11:25 AM  Depression screen PHQ 2/9  Decreased Interest 3 1 0 0 1  Down, Depressed, Hopeless 2 1 0 0 1  PHQ - 2 Score 5 2 0 0 2  Altered sleeping 0 0 0  1  Tired, decreased energy 0 1 0  0  Change in appetite 1 0 1  0  Feeling bad or failure about yourself  3 3 3  3   Trouble concentrating 0 0 0  0  Moving slowly or fidgety/restless 1 0 0  0   Suicidal thoughts 2 1 1  1   PHQ-9 Score 12 7 5  7   Difficult doing work/chores Somewhat difficult Not difficult at all Somewhat difficult     Flowsheet Row Counselor from 04/03/2024 in [facility] from 02/21/2024 in [facility] [place] Counselor from 01/24/2024 in [facility]  C-SSRS RISK CATEGORY Moderate Risk Moderate Risk High Risk    Suicidal/Homicidal: None, No plan to harm self or others Reports pSI over recent days; Attributed to The holidays in part, not being around them at all; My usual hatred of myself really. Contracted for safety.  Therapist Response:  Clinician *** actively greeted pt upon presenting for visit, assessing presenting moods and affect, engaging in check-in, utilizing open ended questions in eliciting recounts of events of the past three weeks, and actively listening to pt's recounts, and identified challenges surrounding living arrangements, holidays, and implications on moods. Clinician utilized CBT, MI, supportive reflection, psychoeducation, and solution focused techniques to support emotional processing. Clinician reassessed severity of presenting sxs, and presence of any safety concerns, reviewing Suicide Safety information and availability of resources.  [x]  Cognitive Challenging [x]  Cognitive Refocusing []  Cognitive Reframing  []  Communication Skills []  Compliance  Issues []  DBT [x]  Exploration of Coping Patterns [x]  Exploration of Emotions []  Exploration of Relationship Patterns []  Guided Imagery []  Interactive Feedback []  Interpersonal Resolutions []  Mindfulness Training [x]  Preventative Services [x]  Psycho-Education  []  Relaxation/Deep Breathing []  Review of Treatment Plan/Progress []  Role-Play/Behavioral Rehearsal  [x]  Structured Problem Solving [x]  Supportive Reflection [x]  Symptom Management  []  Other   Patient responded well to  interventions. Patient continues to meet criteria for MDD, severe, recurrent, CPTSD, and GAD. Patient will continue to benefit from engagement in outpatient therapy due to being the least restrictive service to meet presenting needs.    Homework: None.  Plan: Return again in 1 weeks. Begin exploration of thoughts, negative thought patterns, cognitive triangle, etc.  Diagnosis:  Encounter Diagnoses  Name Primary?   Major depressive disorder, recurrent, severe without psychotic features (HCC) Yes   GAD (generalized anxiety disorder)    Chronic posttraumatic stress disorder    Collaboration of Care: Psychiatrist AEB provider documentation available in EHR.  Patient/Guardian was advised Release of Information must be obtained prior to any record release in order to collaborate their care with an outside provider. Patient/Guardian was advised if they have not already done so to contact the registration department to sign all necessary forms in order for us  to release information regarding their care.   Consent: Patient/Guardian gives verbal consent for treatment and assignment of benefits for services provided during this visit. Patient/Guardian expressed understanding and agreed to proceed.   Virtual Visit via Video Note  I connected with [name] on 04/10/2024 at 10:00 AM EST by a video enabled telemedicine application and verified that I am speaking with the correct person using two identifiers.  Location: Patient: Friend's residence Provider: Home office   I discussed the limitations of evaluation and management by telemedicine and the availability of in person appointments. The patient expressed understanding and agreed to proceed.  I discussed the assessment and treatment plan with the patient. The patient was provided an opportunity to ask questions and all were answered. The patient agreed with the plan and demonstrated an understanding of the instructions.   The patient was  advised to call back or seek an in-person evaluation if the symptoms worsen or if the condition fails to improve as anticipated.  I provided 51 minutes of non-face-to-face time during this encounter.   Lynwood JONETTA Maris, MSW, LCSW 04/10/2024,  10:13 AM

## 2024-04-13 ENCOUNTER — Other Ambulatory Visit: Payer: Self-pay

## 2024-04-16 ENCOUNTER — Telehealth (HOSPITAL_COMMUNITY)

## 2024-04-16 ENCOUNTER — Other Ambulatory Visit: Payer: Self-pay

## 2024-04-16 DIAGNOSIS — Z758 Other problems related to medical facilities and other health care: Secondary | ICD-10-CM | POA: Diagnosis not present

## 2024-04-16 DIAGNOSIS — F332 Major depressive disorder, recurrent severe without psychotic features: Secondary | ICD-10-CM

## 2024-04-16 DIAGNOSIS — F4312 Post-traumatic stress disorder, chronic: Secondary | ICD-10-CM

## 2024-04-16 DIAGNOSIS — F411 Generalized anxiety disorder: Secondary | ICD-10-CM | POA: Diagnosis not present

## 2024-04-16 MED ORDER — TRAZODONE HCL 50 MG PO TABS: 50.0000 mg | ORAL_TABLET | Freq: Every evening | ORAL | 2 refills | Status: AC | PRN

## 2024-04-16 MED ORDER — HYDROXYZINE HCL 25 MG PO TABS: 25.0000 mg | ORAL_TABLET | Freq: Three times a day (TID) | ORAL | 2 refills | Status: AC | PRN

## 2024-04-16 MED ORDER — ARIPIPRAZOLE 15 MG PO TABS: 15.0000 mg | ORAL_TABLET | Freq: Every day | ORAL | 2 refills | Status: AC

## 2024-04-16 MED ORDER — SERTRALINE HCL 100 MG PO TABS: 200.0000 mg | ORAL_TABLET | Freq: Every day | ORAL | 2 refills | Status: AC

## 2024-04-16 MED ORDER — PRAZOSIN HCL 1 MG PO CAPS: 1.0000 mg | ORAL_CAPSULE | Freq: Every day | ORAL | 2 refills | Status: AC

## 2024-04-16 MED ORDER — BUPROPION HCL ER (XL) 150 MG PO TB24: 150.0000 mg | ORAL_TABLET | Freq: Every day | ORAL | 2 refills | Status: AC

## 2024-04-17 ENCOUNTER — Other Ambulatory Visit: Payer: Self-pay

## 2024-04-17 ENCOUNTER — Ambulatory Visit (INDEPENDENT_AMBULATORY_CARE_PROVIDER_SITE_OTHER): Admitting: Licensed Clinical Social Worker

## 2024-04-17 DIAGNOSIS — F4312 Post-traumatic stress disorder, chronic: Secondary | ICD-10-CM | POA: Diagnosis not present

## 2024-04-17 DIAGNOSIS — F411 Generalized anxiety disorder: Secondary | ICD-10-CM | POA: Diagnosis not present

## 2024-04-17 DIAGNOSIS — F332 Major depressive disorder, recurrent severe without psychotic features: Secondary | ICD-10-CM | POA: Diagnosis not present

## 2024-04-17 NOTE — Progress Notes (Signed)
 THERAPIST PROGRESS NOTE   Session Date: 04/17/2024  Session Time: 1012 - 1108  Participation Level: Active  MSE/Presentation: Behavior: Appropriate and Sharing Speech: Normal Thought Process: Coherent and Relevant Cognition: Alert, Appropriate, and Oriented Mood: Euthymic Affect: Appropriate and Congruent Insight: Good Appearance: Casual  Type of Therapy: Individual Therapy  Treatment Goals addressed:   Initial (8) LTG: Increase coping skills to manage depression and improve ability to perform daily activities (OP Depression) STG: Reduce overall depression score by a minimum of 25% on the Patient Health Questionnaire (PHQ-9) (OP Depression) STG: Stryker Veasey will identify cognitive patterns and beliefs that support depression (OP Depression) LTG: Gain better control of, and resiliency in response to, periods of depressed moods and symptoms, AEB improved understanding and management of them, and moving forward throughout my day (OP Depression) LTG: Recall traumatic events without becoming overwhelmed with negative emotions (BH CCP Acute or Chronic Trauma Reaction) LTG: Be able to better understand what happened to me in the past, and come up with a framework of dealing with that, so that it doesn't debilitate me and/or result in experiencing a lot of negative thoughts about myself (BH CCP Acute or Chronic Trauma Reaction) STG: Report a decrease in anxiety symptoms as evidenced by an overall reduction in anxiety score by a minimum of 25% on the Generalized Anxiety Disorder Scale (GAD-7) (Anxiety) LTG: Improve abilities to better compartmentalize what it is that I'm worrying about and put things aside, as typically they're related to things that are outside of my control (Anxiety)  Progress Towards Goals: Progressing  Interventions: CBT, Motivational Interviewing, Solution Focused, Strength-based, and Supportive  Summary: [name] is a(n) 64 y.o. male, with psych hx  consistent with MDD, severe, recurrent, CPTSD, and GAD, presenting for follow-up therapy session in efforts to improve management of presenting symptoms.   Pt openly engaged in session, presenting with pleasant mood and congruent affect.   Pt actively engaged in check-in, sharing of doing okay, noting things have been generally good with no complaints, over the past week. 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   Engaged in reassessment of anxious and depressive symptoms via GAD-7 and PHQ-9, noting significant reduction in anxious symptoms and describing feeling on an even keel, with improved ability to navigate minor stressors. Pt also noted decrease in depressive symptoms and fewer negative thoughts, attributing improvement to current medication regimen. Reflected on screening score variances compared to two weeks prior, attributing previous elevations to holiday-related rumination and emotional distance from family, with symptoms now returned to baseline.  Pt discussed recent connection with mother over the past week, and avoidance of exploring potential plans to visit, due to ongoing complexities related to strained relationships with sisters. Further reflected on prior discussion surrounding techniques that may prove to aid pt in navigating this challenge, expressing intent to explore further this week.  Introduced and explored the [employer], discussing how situations trigger thoughts, which influence emotions and subsequent behaviors, reinforcing cyclical patterns, utilizing various different examples from personal experience, processing the relationships between factors, acknowledging how negative thoughts lead to Negative feedback loop.      04/17/2024   10:17 AM 04/03/2024   11:19 AM 03/12/2024   11:10 AM 02/21/2024    2:13 PM  GAD 7 :  Generalized Anxiety Score  Nervous, Anxious, on Edge 0 2  0  0   Control/stop worrying 1  2  1  1    Worry too much - different things 1 2  1  1    Trouble relaxing 0 0  0  0   Restless 0 0  0  0   Easily annoyed or irritable 1 2  1  1    Afraid - awful might happen 1 3  1  2    Total GAD 7 Score 4 11 4 5   Anxiety Difficulty Not difficult at all Somewhat difficult Somewhat difficult Somewhat difficult     Data saved with a previous flowsheet row definition      04/17/2024   10:19 AM 04/03/2024   11:22 AM 03/12/2024   11:12 AM 02/21/2024    2:14 PM 02/13/2024    1:18 PM  Depression screen PHQ 2/9  Decreased Interest 0 3 1 0 0  Down, Depressed, Hopeless 1 2 1  0 0  PHQ - 2 Score 1 5 2  0 0  Altered sleeping 0 0 0 0   Tired, decreased energy 0 0 1 0   Change in appetite 0 1 0 1   Feeling bad or failure about yourself  1 3 3 3    Trouble concentrating 0 0 0 0   Moving slowly or fidgety/restless 0 1 0 0   Suicidal thoughts 0 2 1 1    PHQ-9 Score 2 12 7 5    Difficult doing work/chores Not difficult at all Somewhat difficult Not difficult at all Somewhat difficult    Flowsheet Row Counselor from 04/03/2024 in [facility] from 02/21/2024 in [facility] [place] Counselor from 01/24/2024 in [facility]  C-SSRS RISK CATEGORY Moderate Risk Moderate Risk High Risk    Suicidal/Homicidal: None, No plan to harm self or others  Therapist Response:  Clinician actively greeted pt upon presenting for visit, assessing presenting moods and affect, engaging in check-in, utilizing open ended questions in eliciting recounts of past week, and actively listening to recounts. Utilized CBT-based psychoeducation using the [employer] to increase insight into thought-emotion-behavior links, MI strategies to support ongoing engagement in meaningful activities (e.g., volunteering), and  strengths-based reflections highlighting pts resilience and improved coping.  Therapist offered supportive guidance around navigating family interactions and encouraged continued use of emerging cognitive awareness to maintain symptom stability. Clinician reassessed severity of presenting sxs, and presence of any safety concerns, reviewing Suicide Safety information and availability of resources.  [x]  Cognitive Challenging []  Cognitive Refocusing [x]  Cognitive Reframing  [x]  Communication Skills []  Compliance Issues []  DBT []  Exploration of Coping Patterns [x]  Exploration of Emotions [x]  Exploration of Relationship Patterns []  Guided Imagery []  Interactive Feedback []  Interpersonal Resolutions []  Mindfulness Training []  Preventative Services [x]  Psycho-Education  []  Relaxation/Deep Breathing []  Review of Treatment Plan/Progress []  Role-Play/Behavioral Rehearsal  [x]  Structured Problem Solving [x]  Supportive Reflection []  Symptom Management  []  Other   Patient responded well to interventions. Patient continues to meet criteria for MDD, severe, recurrent, CPTSD, and GAD. Patient will continue to benefit from engagement in outpatient therapy due to being the least restrictive service to meet presenting needs.    Homework: None.  Plan: Return again in 1 weeks. Continue exploration of thoughts, negative thought patterns, cognitive triangle, etc.  Diagnosis:  Encounter Diagnoses  Name Primary?   Severe episode of recurrent major depressive disorder, without psychotic features (HCC) Yes   GAD (generalized anxiety disorder)    Chronic posttraumatic stress disorder    Collaboration of Care: Psychiatrist AEB  provider documentation available in EHR.  Patient/Guardian was advised Release of Information must be obtained prior to any record release in order to collaborate their care with an outside provider. Patient/Guardian was advised if they have not already done so to contact the registration  department to sign all necessary forms in order for us  to release information regarding their care.   Consent: Patient/Guardian gives verbal consent for treatment and assignment of benefits for services provided during this visit. Patient/Guardian expressed understanding and agreed to proceed.   Virtual Visit via Video Note  I connected with [name] on 04/17/24 at 10:00 AM EST by a video enabled telemedicine application and verified that I am speaking with the correct person using two identifiers.  Location: Patient: Friend's residence Provider: Home office   I discussed the limitations of evaluation and management by telemedicine and the availability of in person appointments. The patient expressed understanding and agreed to proceed.  I discussed the assessment and treatment plan with the patient. The patient was provided an opportunity to ask questions and all were answered. The patient agreed with the plan and demonstrated an understanding of the instructions.   The patient was advised to call back or seek an in-person evaluation if the symptoms worsen or if the condition fails to improve as anticipated.  I provided 56 minutes of non-face-to-face time during this encounter.   Lynwood JONETTA Maris, MSW, LCSW 04/17/2024,  10:13 AM

## 2024-04-17 NOTE — Addendum Note (Signed)
 Addended by: CARVIN CROCK on: 04/17/2024 11:30 AM   Modules accepted: Level of Service

## 2024-04-24 ENCOUNTER — Ambulatory Visit (HOSPITAL_COMMUNITY): Admitting: Licensed Clinical Social Worker

## 2024-04-24 DIAGNOSIS — F4312 Post-traumatic stress disorder, chronic: Secondary | ICD-10-CM | POA: Diagnosis not present

## 2024-04-24 DIAGNOSIS — F411 Generalized anxiety disorder: Secondary | ICD-10-CM | POA: Diagnosis not present

## 2024-04-24 DIAGNOSIS — F332 Major depressive disorder, recurrent severe without psychotic features: Secondary | ICD-10-CM | POA: Diagnosis not present

## 2024-04-24 NOTE — Progress Notes (Signed)
 THERAPIST PROGRESS NOTE   Session Date: 04/24/2024  Session Time: 1015 - 1121  Participation Level: Active  MSE/Presentation: Behavior: Appropriate and Sharing Speech: Normal Thought Process: Coherent and Relevant Cognition: Alert, Appropriate, and Oriented Mood: Depressed Affect: Appropriate and Congruent Insight: Good Appearance: Casual  Type of Therapy: Individual Therapy  Treatment Goals addressed:   Initial (8) LTG: Increase coping skills to manage depression and improve ability to perform daily activities (OP Depression) STG: Reduce overall depression score by a minimum of 25% on the Patient Health Questionnaire (PHQ-9) (OP Depression) STG: [name] will identify cognitive patterns and beliefs that support depression (OP Depression) LTG: Gain better control of, and resiliency in response to, periods of depressed moods and symptoms, AEB improved understanding and management of them, and moving forward throughout my day (OP Depression) LTG: Recall traumatic events without becoming overwhelmed with negative emotions (BH CCP Acute or Chronic Trauma Reaction) LTG: Be able to better understand what happened to me in the past, and come up with a framework of dealing with that, so that it doesn't debilitate me and/or result in experiencing a lot of negative thoughts about myself (BH CCP Acute or Chronic Trauma Reaction) STG: Report a decrease in anxiety symptoms as evidenced by an overall reduction in anxiety score by a minimum of 25% on the Generalized Anxiety Disorder Scale (GAD-7) (Anxiety) LTG: Improve abilities to better compartmentalize what it is that I'm worrying about and put things aside, as typically they're related to things that are outside of my control (Anxiety)  Progress Towards Goals: Progressing  Interventions: CBT, Motivational Interviewing, Solution Focused, Strength-based, and Supportive  Summary: [name] is a(n) 64 y.o. male, with psych  hx consistent with MDD, severe, recurrent, CPTSD, and GAD, presenting for follow-up therapy session in efforts to improve management of presenting symptoms.   Patient openly engaged in todays session, presenting with a depressed mood and congruent affect. During check-in, patient stated he was doing okay, though he acknowledged a noticeable worsening in depressive symptoms since the previous visit. He described feeling increasingly withdrawn, unmotivated, and emotionally burdened over the past week, attributing much of the decline to ongoing tension with his sisters and the fiasco surrounding the hx of family conflict. Patient endorsed persistent negative cognitions regarding worthlessness, feeling uncared for, unsuccessful, and generally undeserving--thoughts he identified as becoming more pervasive due to minimal activity and heightened psychomotor slowing throughout the week.  Patient further explored patterns of rumination, sharing how being homebound during the wintry weather contributed to decreased functioning and an internal feedback loop of inactivity ? worsening mood ? decreased activity. 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 Using this example, patient processed feelings of inadequacy and perceived personal failure. Through discussion, patient was able to identify contextual factors contributing to the dogs behavior (excitement, unfamiliarity with patient, established bond with trainer), gradually reframing this incident less as a personal shortcoming and more as a situational challenge.  Patient also processed low activity over the past 3-4 days due to inclement weather impacting his ability to work [employer] shifts, which contributed to increased depressive cognitions, feelings of purposelessness, and hopelessness. Assessed safety, patient  endorsed passive SI (just thoughts) but denied plans, intent, or preparatory behaviors. Safety planning, coping strategies, and crisis resources were reviewed, and patient verbalized understanding of steps to maintain safety.     04/17/2024   10:17  AM 04/03/2024   11:19 AM 03/12/2024   11:10 AM 02/21/2024    2:13 PM  GAD 7 : Generalized Anxiety Score  Nervous, Anxious, on Edge 0 2  0  0   Control/stop worrying 1 2  1  1    Worry too much - different things 1 2  1  1    Trouble relaxing 0 0  0  0   Restless 0 0  0  0   Easily annoyed or irritable 1 2  1  1    Afraid - awful might happen 1 3  1  2    Total GAD 7 Score 4 11 4 5   Anxiety Difficulty Not difficult at all Somewhat difficult Somewhat difficult Somewhat difficult     Data saved with a previous flowsheet row definition      04/17/2024   10:19 AM 04/03/2024   11:22 AM 03/12/2024   11:12 AM 02/21/2024    2:14 PM 02/13/2024    1:18 PM  Depression screen PHQ 2/9  Decreased Interest 0 3 1 0 0  Down, Depressed, Hopeless 1 2 1  0 0  PHQ - 2 Score 1 5 2  0 0  Altered sleeping 0 0 0 0   Tired, decreased energy 0 0 1 0   Change in appetite 0 1 0 1   Feeling bad or failure about yourself  1 3 3 3    Trouble concentrating 0 0 0 0   Moving slowly or fidgety/restless 0 1 0 0   Suicidal thoughts 0 2 1 1    PHQ-9 Score 2 12 7 5    Difficult doing work/chores Not difficult at all Somewhat difficult Not difficult at all Somewhat difficult    Flowsheet Row Counselor from 04/03/2024 in [facility] from 02/21/2024 in [facility] [place] Counselor from 01/24/2024 in [facility]  C-SSRS RISK CATEGORY Moderate Risk Moderate Risk High Risk    Suicidal/Homicidal: Passive, No plan to harm self or others; I'm okay. Reviewed safety plan, acknowledged understanding and availability of resources, 9-8-8, national suicide hotline, 9-1-1,  [facility].  Therapist Response:  Clinician actively greeted pt upon presenting for visit, assessing presenting moods and affect, engaging in check-in, utilizing open ended questions in eliciting recounts of past week, and actively listening to recounts. Clinician utilized [employer] questioning to challenge negative automatic thoughts, examine evidence, and highlight cognitive distortions contributing to perceived worthlessness. Provided normalization, reframing, and exploration of behavioral activation to counteract psychomotor slowing and rumination. Clinician reviewed safety measures, reinforced protective factors, and collaborated on coping strategies and crisis planning. Clinician reassessed severity of presenting sxs, and presence of any safety concerns, reviewing Suicide Safety information and availability of resources.  [x]  Cognitive Challenging []  Cognitive Refocusing [x]  Cognitive Reframing  [x]  Communication Skills []  Compliance Issues []  DBT []  Exploration of Coping Patterns [x]  Exploration of Emotions [x]  Exploration of Relationship Patterns []  Guided Imagery []  Interactive Feedback []  Interpersonal Resolutions []  Mindfulness Training [x]  Preventative Services [x]  Psycho-Education  []  Relaxation/Deep Breathing []  Review of Treatment Plan/Progress []  Role-Play/Behavioral Rehearsal  [x]  Structured Problem Solving [x]  Supportive Reflection [x]  Symptom Management  []  Other   Patient responded well to interventions. Patient continues to meet criteria for MDD, severe, recurrent, CPTSD, and GAD. Patient will continue to benefit from engagement in outpatient therapy due to being the least restrictive service to meet presenting needs.    Homework: None. Continue practicing challenging negative thoughts.  Plan: Return  again in 1 weeks. Continue exploration of thoughts, negative thought patterns, cognitive triangle, etc.  Diagnosis:  Encounter Diagnoses  Name Primary?   Severe  episode of recurrent major depressive disorder, without psychotic features (HCC) Yes   GAD (generalized anxiety disorder)    Chronic posttraumatic stress disorder    Collaboration of Care: Psychiatrist AEB provider documentation available in EHR.  Patient/Guardian was advised Release of Information must be obtained prior to any record release in order to collaborate their care with an outside provider. Patient/Guardian was advised if they have not already done so to contact the registration department to sign all necessary forms in order for us  to release information regarding their care.   Consent: Patient/Guardian gives verbal consent for treatment and assignment of benefits for services provided during this visit. Patient/Guardian expressed understanding and agreed to proceed.   Virtual Visit via Video Note  I connected with [name] on 04/24/24 at 10:00 AM EST by a video enabled telemedicine application and verified that I am speaking with the correct person using two identifiers.  Location: Patient: Friend's residence Provider: Home office   I discussed the limitations of evaluation and management by telemedicine and the availability of in person appointments. The patient expressed understanding and agreed to proceed.  I discussed the assessment and treatment plan with the patient. The patient was provided an opportunity to ask questions and all were answered. The patient agreed with the plan and demonstrated an understanding of the instructions.   The patient was advised to call back or seek an in-person evaluation if the symptoms worsen or if the condition fails to improve as anticipated.  I provided 66 minutes of non-face-to-face time during this encounter.   Lynwood JONETTA Maris, MSW, LCSW 04/24/2024,  10:16 AM

## 2024-04-30 ENCOUNTER — Ambulatory Visit (INDEPENDENT_AMBULATORY_CARE_PROVIDER_SITE_OTHER): Admitting: Licensed Clinical Social Worker

## 2024-04-30 DIAGNOSIS — F332 Major depressive disorder, recurrent severe without psychotic features: Secondary | ICD-10-CM

## 2024-04-30 DIAGNOSIS — F411 Generalized anxiety disorder: Secondary | ICD-10-CM | POA: Diagnosis not present

## 2024-04-30 DIAGNOSIS — F4312 Post-traumatic stress disorder, chronic: Secondary | ICD-10-CM | POA: Diagnosis not present

## 2024-05-07 ENCOUNTER — Ambulatory Visit (HOSPITAL_COMMUNITY): Admitting: Licensed Clinical Social Worker

## 2024-05-14 ENCOUNTER — Ambulatory Visit (HOSPITAL_COMMUNITY): Admitting: Licensed Clinical Social Worker

## 2024-05-21 ENCOUNTER — Ambulatory Visit (HOSPITAL_COMMUNITY): Admitting: Licensed Clinical Social Worker

## 2024-05-28 ENCOUNTER — Ambulatory Visit (HOSPITAL_COMMUNITY): Admitting: Licensed Clinical Social Worker

## 2024-06-04 ENCOUNTER — Ambulatory Visit (HOSPITAL_COMMUNITY): Admitting: Licensed Clinical Social Worker

## 2024-06-25 ENCOUNTER — Telehealth (HOSPITAL_COMMUNITY)
